# Patient Record
Sex: Male | Born: 1960 | Race: White | Hispanic: No | Marital: Married | Smoking: Former smoker
Health system: Southern US, Community
[De-identification: ages and names within clinical notes are randomized; demographics above are authoritative.]

## PROBLEM LIST (undated history)

## (undated) DIAGNOSIS — R351 Nocturia: Secondary | ICD-10-CM

## (undated) DIAGNOSIS — K279 Peptic ulcer, site unspecified, unspecified as acute or chronic, without hemorrhage or perforation: Secondary | ICD-10-CM

## (undated) DIAGNOSIS — I209 Angina pectoris, unspecified: Secondary | ICD-10-CM

## (undated) DIAGNOSIS — E118 Type 2 diabetes mellitus with unspecified complications: Secondary | ICD-10-CM

## (undated) DIAGNOSIS — M2550 Pain in unspecified joint: Secondary | ICD-10-CM

## (undated) DIAGNOSIS — N200 Calculus of kidney: Secondary | ICD-10-CM

## (undated) DIAGNOSIS — T4145XA Adverse effect of unspecified anesthetic, initial encounter: Secondary | ICD-10-CM

## (undated) DIAGNOSIS — E782 Mixed hyperlipidemia: Secondary | ICD-10-CM

## (undated) DIAGNOSIS — G8929 Other chronic pain: Secondary | ICD-10-CM

## (undated) DIAGNOSIS — I10 Essential (primary) hypertension: Secondary | ICD-10-CM

## (undated) DIAGNOSIS — J40 Bronchitis, not specified as acute or chronic: Secondary | ICD-10-CM

## (undated) DIAGNOSIS — I2510 Atherosclerotic heart disease of native coronary artery without angina pectoris: Secondary | ICD-10-CM

## (undated) DIAGNOSIS — E7800 Pure hypercholesterolemia, unspecified: Secondary | ICD-10-CM

## (undated) DIAGNOSIS — K219 Gastro-esophageal reflux disease without esophagitis: Secondary | ICD-10-CM

## (undated) DIAGNOSIS — M549 Dorsalgia, unspecified: Secondary | ICD-10-CM

## (undated) DIAGNOSIS — F329 Major depressive disorder, single episode, unspecified: Secondary | ICD-10-CM

## (undated) DIAGNOSIS — F419 Anxiety disorder, unspecified: Secondary | ICD-10-CM

## (undated) DIAGNOSIS — M109 Gout, unspecified: Secondary | ICD-10-CM

## (undated) DIAGNOSIS — M1990 Unspecified osteoarthritis, unspecified site: Secondary | ICD-10-CM

## (undated) DIAGNOSIS — Z87442 Personal history of urinary calculi: Secondary | ICD-10-CM

## (undated) DIAGNOSIS — T8859XA Other complications of anesthesia, initial encounter: Secondary | ICD-10-CM

## (undated) DIAGNOSIS — F32A Depression, unspecified: Secondary | ICD-10-CM

## (undated) HISTORY — DX: Other complications of anesthesia, initial encounter: T88.59XA

## (undated) HISTORY — DX: Mixed hyperlipidemia: E78.2

## (undated) HISTORY — DX: Atherosclerotic heart disease of native coronary artery without angina pectoris: I25.10

## (undated) HISTORY — DX: Type 2 diabetes mellitus with unspecified complications: E11.8

## (undated) HISTORY — DX: Morbid (severe) obesity due to excess calories: E66.01

## (undated) HISTORY — PX: ESOPHAGOGASTRODUODENOSCOPY: SHX1529

## (undated) HISTORY — DX: Adverse effect of unspecified anesthetic, initial encounter: T41.45XA

## (undated) HISTORY — DX: Essential (primary) hypertension: I10

## (undated) HISTORY — PX: BACK SURGERY: SHX140

---
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
## 1998-03-02 ENCOUNTER — Emergency Department (HOSPITAL_COMMUNITY): Admission: EM | Admit: 1998-03-02 | Discharge: 1998-03-02 | Payer: Self-pay | Admitting: Emergency Medicine

## 1999-05-30 ENCOUNTER — Emergency Department (HOSPITAL_COMMUNITY): Admission: EM | Admit: 1999-05-30 | Discharge: 1999-05-30 | Payer: Self-pay | Admitting: Emergency Medicine

## 1999-05-30 ENCOUNTER — Encounter: Payer: Self-pay | Admitting: Emergency Medicine

## 1999-09-20 ENCOUNTER — Encounter: Admission: RE | Admit: 1999-09-20 | Discharge: 1999-12-19 | Payer: Self-pay | Admitting: Gastroenterology

## 1999-09-22 ENCOUNTER — Emergency Department (HOSPITAL_COMMUNITY): Admission: EM | Admit: 1999-09-22 | Discharge: 1999-09-23 | Payer: Self-pay

## 1999-09-25 ENCOUNTER — Emergency Department (HOSPITAL_COMMUNITY): Admission: EM | Admit: 1999-09-25 | Discharge: 1999-09-26 | Payer: Self-pay | Admitting: Emergency Medicine

## 1999-09-26 ENCOUNTER — Encounter: Payer: Self-pay | Admitting: Emergency Medicine

## 2000-01-19 ENCOUNTER — Encounter: Admission: RE | Admit: 2000-01-19 | Discharge: 2000-01-19 | Payer: Self-pay | Admitting: Gastroenterology

## 2000-01-19 ENCOUNTER — Encounter: Payer: Self-pay | Admitting: Gastroenterology

## 2000-04-16 ENCOUNTER — Emergency Department (HOSPITAL_COMMUNITY): Admission: EM | Admit: 2000-04-16 | Discharge: 2000-04-16 | Payer: Self-pay | Admitting: Internal Medicine

## 2000-06-08 ENCOUNTER — Ambulatory Visit (HOSPITAL_COMMUNITY): Admission: RE | Admit: 2000-06-08 | Discharge: 2000-06-08 | Payer: Self-pay | Admitting: Specialist

## 2000-06-08 ENCOUNTER — Encounter: Payer: Self-pay | Admitting: Specialist

## 2000-09-05 HISTORY — PX: LUMBAR DISC SURGERY: SHX700

## 2000-11-22 ENCOUNTER — Encounter: Payer: Self-pay | Admitting: Specialist

## 2000-11-29 ENCOUNTER — Encounter: Payer: Self-pay | Admitting: Specialist

## 2000-11-29 ENCOUNTER — Encounter (INDEPENDENT_AMBULATORY_CARE_PROVIDER_SITE_OTHER): Payer: Self-pay | Admitting: Specialist

## 2000-11-29 ENCOUNTER — Observation Stay (HOSPITAL_COMMUNITY): Admission: RE | Admit: 2000-11-29 | Discharge: 2000-11-30 | Payer: Self-pay | Admitting: Specialist

## 2002-02-11 ENCOUNTER — Emergency Department (HOSPITAL_COMMUNITY): Admission: EM | Admit: 2002-02-11 | Discharge: 2002-02-11 | Payer: Self-pay | Admitting: Emergency Medicine

## 2002-06-26 ENCOUNTER — Encounter: Admission: RE | Admit: 2002-06-26 | Discharge: 2002-06-26 | Payer: Self-pay | Admitting: Neurosurgery

## 2002-06-26 ENCOUNTER — Encounter: Payer: Self-pay | Admitting: Neurosurgery

## 2002-09-01 ENCOUNTER — Emergency Department (HOSPITAL_COMMUNITY): Admission: EM | Admit: 2002-09-01 | Discharge: 2002-09-01 | Payer: Self-pay | Admitting: Emergency Medicine

## 2002-09-02 ENCOUNTER — Ambulatory Visit (HOSPITAL_COMMUNITY): Admission: EM | Admit: 2002-09-02 | Discharge: 2002-09-02 | Payer: Self-pay | Admitting: Emergency Medicine

## 2002-09-05 DIAGNOSIS — I2510 Atherosclerotic heart disease of native coronary artery without angina pectoris: Secondary | ICD-10-CM

## 2002-09-05 DIAGNOSIS — T8859XA Other complications of anesthesia, initial encounter: Secondary | ICD-10-CM

## 2002-09-05 HISTORY — DX: Other complications of anesthesia, initial encounter: T88.59XA

## 2002-09-05 HISTORY — PX: LUMBAR FUSION: SHX111

## 2002-09-05 HISTORY — DX: Atherosclerotic heart disease of native coronary artery without angina pectoris: I25.10

## 2003-03-14 ENCOUNTER — Encounter: Payer: Self-pay | Admitting: Neurosurgery

## 2003-03-14 ENCOUNTER — Encounter: Admission: RE | Admit: 2003-03-14 | Discharge: 2003-03-14 | Payer: Self-pay | Admitting: Neurosurgery

## 2003-04-24 ENCOUNTER — Encounter: Payer: Self-pay | Admitting: Neurosurgery

## 2003-04-29 ENCOUNTER — Inpatient Hospital Stay (HOSPITAL_COMMUNITY): Admission: RE | Admit: 2003-04-29 | Discharge: 2003-04-30 | Payer: Self-pay | Admitting: Neurosurgery

## 2003-04-30 HISTORY — PX: US ECHOCARDIOGRAPHY: HXRAD669

## 2003-05-30 ENCOUNTER — Ambulatory Visit (HOSPITAL_COMMUNITY): Admission: RE | Admit: 2003-05-30 | Discharge: 2003-05-31 | Payer: Self-pay | Admitting: *Deleted

## 2003-05-30 HISTORY — PX: CORONARY STENT PLACEMENT: SHX1402

## 2003-09-01 ENCOUNTER — Inpatient Hospital Stay (HOSPITAL_COMMUNITY): Admission: RE | Admit: 2003-09-01 | Discharge: 2003-09-05 | Payer: Self-pay | Admitting: Neurosurgery

## 2003-09-06 DIAGNOSIS — I10 Essential (primary) hypertension: Secondary | ICD-10-CM

## 2003-09-06 HISTORY — DX: Essential (primary) hypertension: I10

## 2003-09-24 DIAGNOSIS — G8929 Other chronic pain: Secondary | ICD-10-CM

## 2003-09-24 HISTORY — DX: Other chronic pain: G89.29

## 2003-11-25 ENCOUNTER — Encounter: Admission: RE | Admit: 2003-11-25 | Discharge: 2003-11-25 | Payer: Self-pay | Admitting: Neurosurgery

## 2004-01-27 ENCOUNTER — Encounter: Admission: RE | Admit: 2004-01-27 | Discharge: 2004-01-27 | Payer: Self-pay | Admitting: Neurosurgery

## 2004-04-08 ENCOUNTER — Encounter: Admission: RE | Admit: 2004-04-08 | Discharge: 2004-04-08 | Payer: Self-pay | Admitting: Neurosurgery

## 2004-05-19 HISTORY — PX: NM MYOCAR PERF WALL MOTION: HXRAD629

## 2004-05-25 ENCOUNTER — Encounter: Admission: RE | Admit: 2004-05-25 | Discharge: 2004-05-25 | Payer: Self-pay | Admitting: Neurosurgery

## 2004-06-23 ENCOUNTER — Encounter
Admission: RE | Admit: 2004-06-23 | Discharge: 2004-09-21 | Payer: Self-pay | Admitting: Physical Medicine & Rehabilitation

## 2004-06-25 ENCOUNTER — Ambulatory Visit: Payer: Self-pay | Admitting: Physical Medicine & Rehabilitation

## 2004-08-12 ENCOUNTER — Ambulatory Visit: Payer: Self-pay | Admitting: Physical Medicine & Rehabilitation

## 2004-09-05 DIAGNOSIS — F32A Depression, unspecified: Secondary | ICD-10-CM

## 2004-09-05 HISTORY — DX: Depression, unspecified: F32.A

## 2004-10-12 ENCOUNTER — Ambulatory Visit: Payer: Self-pay | Admitting: Physical Medicine & Rehabilitation

## 2004-10-12 ENCOUNTER — Encounter
Admission: RE | Admit: 2004-10-12 | Discharge: 2005-01-10 | Payer: Self-pay | Admitting: Physical Medicine & Rehabilitation

## 2004-10-25 ENCOUNTER — Ambulatory Visit: Payer: Self-pay | Admitting: Physical Medicine & Rehabilitation

## 2004-11-12 ENCOUNTER — Encounter
Admission: RE | Admit: 2004-11-12 | Discharge: 2004-11-12 | Payer: Self-pay | Admitting: Physical Medicine & Rehabilitation

## 2005-01-13 ENCOUNTER — Encounter
Admission: RE | Admit: 2005-01-13 | Discharge: 2005-04-13 | Payer: Self-pay | Admitting: Physical Medicine & Rehabilitation

## 2005-01-17 ENCOUNTER — Ambulatory Visit: Payer: Self-pay | Admitting: Physical Medicine & Rehabilitation

## 2005-03-11 ENCOUNTER — Ambulatory Visit: Payer: Self-pay | Admitting: Physical Medicine & Rehabilitation

## 2005-04-28 ENCOUNTER — Emergency Department (HOSPITAL_COMMUNITY): Admission: EM | Admit: 2005-04-28 | Discharge: 2005-04-28 | Payer: Self-pay | Admitting: Emergency Medicine

## 2005-05-11 ENCOUNTER — Ambulatory Visit: Payer: Self-pay | Admitting: Physical Medicine & Rehabilitation

## 2005-05-11 ENCOUNTER — Encounter
Admission: RE | Admit: 2005-05-11 | Discharge: 2005-08-09 | Payer: Self-pay | Admitting: Physical Medicine & Rehabilitation

## 2005-08-05 ENCOUNTER — Ambulatory Visit: Payer: Self-pay | Admitting: Physical Medicine & Rehabilitation

## 2005-10-19 ENCOUNTER — Emergency Department (HOSPITAL_COMMUNITY): Admission: EM | Admit: 2005-10-19 | Discharge: 2005-10-19 | Payer: Self-pay | Admitting: Emergency Medicine

## 2005-10-26 ENCOUNTER — Ambulatory Visit: Payer: Self-pay | Admitting: Physical Medicine & Rehabilitation

## 2005-10-26 ENCOUNTER — Encounter
Admission: RE | Admit: 2005-10-26 | Discharge: 2006-01-24 | Payer: Self-pay | Admitting: Physical Medicine & Rehabilitation

## 2006-01-25 ENCOUNTER — Encounter
Admission: RE | Admit: 2006-01-25 | Discharge: 2006-04-25 | Payer: Self-pay | Admitting: Physical Medicine & Rehabilitation

## 2006-01-25 ENCOUNTER — Ambulatory Visit: Payer: Self-pay | Admitting: Physical Medicine & Rehabilitation

## 2006-06-01 ENCOUNTER — Encounter
Admission: RE | Admit: 2006-06-01 | Discharge: 2006-08-30 | Payer: Self-pay | Admitting: Physical Medicine & Rehabilitation

## 2006-06-01 ENCOUNTER — Ambulatory Visit: Payer: Self-pay | Admitting: Physical Medicine & Rehabilitation

## 2006-10-20 ENCOUNTER — Encounter
Admission: RE | Admit: 2006-10-20 | Discharge: 2007-01-18 | Payer: Self-pay | Admitting: Physical Medicine & Rehabilitation

## 2006-10-20 ENCOUNTER — Ambulatory Visit: Payer: Self-pay | Admitting: Physical Medicine & Rehabilitation

## 2007-02-15 ENCOUNTER — Encounter
Admission: RE | Admit: 2007-02-15 | Discharge: 2007-05-16 | Payer: Self-pay | Admitting: Physical Medicine & Rehabilitation

## 2007-02-15 ENCOUNTER — Ambulatory Visit: Payer: Self-pay | Admitting: Physical Medicine & Rehabilitation

## 2007-02-21 DIAGNOSIS — M109 Gout, unspecified: Secondary | ICD-10-CM

## 2007-02-21 HISTORY — DX: Gout, unspecified: M10.9

## 2007-05-15 ENCOUNTER — Encounter
Admission: RE | Admit: 2007-05-15 | Discharge: 2007-05-16 | Payer: Self-pay | Admitting: Physical Medicine & Rehabilitation

## 2007-05-15 ENCOUNTER — Ambulatory Visit: Payer: Self-pay | Admitting: Physical Medicine & Rehabilitation

## 2007-05-23 ENCOUNTER — Encounter: Admission: RE | Admit: 2007-05-23 | Discharge: 2007-05-23 | Payer: Self-pay | Admitting: Internal Medicine

## 2007-08-09 ENCOUNTER — Encounter
Admission: RE | Admit: 2007-08-09 | Discharge: 2007-08-11 | Payer: Self-pay | Admitting: Physical Medicine & Rehabilitation

## 2007-08-09 ENCOUNTER — Ambulatory Visit: Payer: Self-pay | Admitting: Physical Medicine & Rehabilitation

## 2007-11-28 ENCOUNTER — Encounter
Admission: RE | Admit: 2007-11-28 | Discharge: 2007-11-28 | Payer: Self-pay | Admitting: Physical Medicine & Rehabilitation

## 2008-03-14 ENCOUNTER — Encounter
Admission: RE | Admit: 2008-03-14 | Discharge: 2008-03-14 | Payer: Self-pay | Admitting: Physical Medicine & Rehabilitation

## 2008-03-14 ENCOUNTER — Ambulatory Visit: Payer: Self-pay | Admitting: Physical Medicine & Rehabilitation

## 2008-06-26 ENCOUNTER — Encounter
Admission: RE | Admit: 2008-06-26 | Discharge: 2008-09-24 | Payer: Self-pay | Admitting: Physical Medicine & Rehabilitation

## 2008-08-07 ENCOUNTER — Ambulatory Visit: Payer: Self-pay | Admitting: Physical Medicine & Rehabilitation

## 2009-11-27 ENCOUNTER — Observation Stay (HOSPITAL_COMMUNITY): Admission: EM | Admit: 2009-11-27 | Discharge: 2009-11-28 | Payer: Self-pay | Admitting: Cardiovascular Disease

## 2009-11-27 ENCOUNTER — Encounter: Payer: Self-pay | Admitting: Emergency Medicine

## 2009-11-27 HISTORY — PX: CARDIAC CATHETERIZATION: SHX172

## 2010-11-29 LAB — BASIC METABOLIC PANEL
BUN: 16 mg/dL (ref 6–23)
CO2: 23 mEq/L (ref 19–32)
CO2: 24 mEq/L (ref 19–32)
Calcium: 8.4 mg/dL (ref 8.4–10.5)
Calcium: 9 mg/dL (ref 8.4–10.5)
Creatinine, Ser: 0.98 mg/dL (ref 0.4–1.5)
Creatinine, Ser: 1.15 mg/dL (ref 0.4–1.5)
GFR calc Af Amer: >60 mL/min (ref >60)
GFR calc non Af Amer: >60 mL/min (ref >60)
GFR calc non Af Amer: >60 mL/min (ref >60)
Glucose, Bld: 154 mg/dL — ABNORMAL HIGH (ref 70–99)
Glucose, Bld: 216 mg/dL — ABNORMAL HIGH (ref 70–99)
Sodium: 138 mEq/L (ref 135–145)

## 2010-11-29 LAB — CBC
Hemoglobin: 12.9 g/dL — ABNORMAL LOW (ref 13.0–17.0)
Hemoglobin: 14.2 g/dL (ref 13.0–17.0)
MCHC: 34.3 g/dL (ref 30.0–36.0)
MCHC: 34.6 g/dL (ref 30.0–36.0)
Platelets: 248 10*3/uL (ref 150–400)
RDW: 12.1 % (ref 11.5–15.5)
RDW: 12.1 % (ref 11.5–15.5)

## 2010-11-29 LAB — HEPATIC FUNCTION PANEL
ALT: 46 U/L (ref 0–53)
AST: 36 U/L (ref 0–37)
Total Protein: 6.6 g/dL (ref 6.0–8.3)

## 2010-11-29 LAB — CARDIAC PANEL(CRET KIN+CKTOT+MB+TROPI)
CK, MB: 7.5 ng/mL (ref 0.3–4.0)
CK, MB: 9.7 ng/mL (ref 0.3–4.0)
Relative Index: 2.4 (ref 0.0–2.5)
Total CK: 314 U/L — ABNORMAL HIGH (ref 7–232)
Troponin I: <0.01 ng/mL (ref 0.00–0.06)

## 2010-11-29 LAB — CK TOTAL AND CKMB (NOT AT ARMC)
CK, MB: 16.4 ng/mL (ref 0.3–4.0)
Relative Index: 3 — ABNORMAL HIGH (ref 0.0–2.5)

## 2010-11-29 LAB — LIPID PANEL
Cholesterol: 110 mg/dL (ref 0–200)
LDL Cholesterol: 41 mg/dL (ref 0–99)
Total CHOL/HDL Ratio: 3.5 RATIO
VLDL: 38 mg/dL (ref 0–40)

## 2010-11-29 LAB — HEPARIN LEVEL (UNFRACTIONATED)
Heparin Unfractionated: <0.1 IU/mL — ABNORMAL LOW (ref 0.30–0.70)
Heparin Unfractionated: <0.1 IU/mL — ABNORMAL LOW (ref 0.30–0.70)

## 2010-11-29 LAB — MAGNESIUM: Magnesium: 2 mg/dL (ref 1.5–2.5)

## 2010-11-29 LAB — TROPONIN I: Troponin I: 0.01 ng/mL (ref 0.00–0.06)

## 2010-11-29 LAB — DIFFERENTIAL
Basophils Absolute: 0 10*3/uL (ref 0.0–0.1)
Eosinophils Absolute: 0 10*3/uL (ref 0.0–0.7)
Eosinophils Relative: 0 % (ref 0–5)
Lymphocytes Relative: 20 % (ref 12–46)
Monocytes Absolute: 0.6 10*3/uL (ref 0.1–1.0)

## 2010-11-29 LAB — D-DIMER, QUANTITATIVE: D-Dimer, Quant: <0.22 ug/mL-FEU (ref 0.00–0.48)

## 2010-11-29 LAB — GLUCOSE, CAPILLARY
Glucose-Capillary: 158 mg/dL — ABNORMAL HIGH (ref 70–99)
Glucose-Capillary: 206 mg/dL — ABNORMAL HIGH (ref 70–99)
Glucose-Capillary: 209 mg/dL — ABNORMAL HIGH (ref 70–99)

## 2010-11-29 LAB — HEMOGLOBIN A1C: Mean Plasma Glucose: 177 mg/dL

## 2011-01-18 NOTE — Assessment & Plan Note (Signed)
[name] returned to clinic today for followup evaluation.  He missed the  last appointment as he was scheduled for 11:30 according to our records,  but his card indicated late afternoon.  He had trouble getting in  because of his work schedule.  He does request that we see him mid day  to late afternoon.   The patient continues to get good relief using his oxycodone 15 mg 4  times per day.  He still has a few remaining from the prescription dated  from July 09, 2008.  He will not need a refill until mid month.   The patient reports that his recent blood sugar has been in the 120-140  range.  His wife has recovered from her hysterectomy and they are both  doing well overall.   REVIEW OF SYSTEMS:  Noncontributory.   MEDICATIONS:  1. Flexeril 10 mg daily p.r.n.  2. Oxycodone 15 mg 1 tablet q.i.d. p.r.n.  3. Avandia 8 mg daily.  4. Diovan/hydrochlorothiazide 160/12.5 one tablet daily.  5. Enteric-coated aspirin 81 mg daily.  6. Metformin 500 mg b.i.d.  7. Lipitor 10 mg daily.  8. Allopurinol daily.   PHYSICAL EXAMINATION:  GENERAL:  Well-appearing, moderately overweight  adult male in mild to no acute discomfort.  VITAL SIGNS:  Blood pressure 127/70 with pulse of 107, respiratory rate  18, and O2 saturation 96% on room air.  EXTREMITIES:  The patient has 5/5 strength throughout the bilateral  upper and lower extremities.  He ambulates without any assistive device.   IMPRESSION:  Persistent low back and right leg pain after lumbar  diskectomy/fusion/foraminotomy in December 2004.   In the office today, we did refill the patient's oxycodone as of  August 18, 2008, a total of 120.  We will plan on seeing the patient  in followup in approximately 6 month's time.  He continues to get good  analgesic effect overall without signs of diversion or significant side  effects.  We will plan on seeing the patient in followup as noted above.           ______________________________  Ellwood Dense, M.D.     DC/MedQ  D:  08/07/2008 11:44:55  T:  08/07/2008 23:32:14  Job #:  [id number]

## 2011-01-18 NOTE — Assessment & Plan Note (Signed)
Devon Wood returns to the clinic today for followup evaluation.  He  continues to do well overall.  He is generally using his oxycodone 1 to  2 tablets per day and has 15 remaining from a script filled, Jan 25, 2008.  He does occasionally use slightly increased amounts but tries to  limit it as much as possible.  He is very compliant with medication  administration.  He reports that his blood sugars are in the 107 to 136  range.  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  He reports that he has lost 20 pounds recently, secondary to stress  related to his wife's recent hysterectomy with complications.   REVIEW OF SYSTEMS:  Noncontributory.   MEDICATIONS:  1. Flexeril 10 mg daily p.r.n.  2. Oxycodone 15 mg 1 tablet q.i.d. p.r.n.  3. Avandia 8 mg daily.  4. Diovan/hydrochlorothiazide 160/12.5 1 tablet daily.  5. Enteric-coated aspirin 81 mg daily.  6. Metformin 500 mg b.i.d.  7. Lipitor 10 mg daily.  8. Allopurinol daily.   PHYSICAL EXAMINATION:  GENERAL:  Moderately to extremely overweight  adult male in mild-to-no acute discomfort.  VITAL SIGNS:  Blood pressure 119/68 with a pulse of 95, respiratory rate  20, and O2 saturation 96% on room air.  EXTREMITIES:  He has 5/5 strength throughout the bilateral upper and  lower extremities.  He ambulates without any assistive device.   IMPRESSION:  Persistent low back and right leg pain after lumbar  diskectomy/fusion/foraminotomy, December 2004.   In the office today, we did refill the patients' oxycodone as of March 21, 2008, a total of 120 were prescribed.  We will plan on seeing him at  follow up in approximately 4 months' time with refills prior that  appointment as necessary.           ______________________________  Devon Wood, M.D.     DC/MedQ  D:  03/14/2008 10:12:51  T:  03/15/2008 07:21:09  Job #:  [id number]

## 2011-01-18 NOTE — Assessment & Plan Note (Signed)
Mr. Nuon returns to clinic today for followup evaluation.  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  He reports that during the summer they  increase their hours and have to do heavier work compared to the regular  school year.  He has increased his oxycodone on occasional days, but  generally he takes it anywhere from 2 to 10 times per day.  He does not  need a refill over the next couple of days.  He reports that he  generally works 10 to 12 hour days at the present time.  He reports that  his blood sugars have been in the 130 range, and he has lost slight  weight recently.   REVIEW OF SYSTEMS:  Noncontributory.   MEDICATIONS:  1. Flexeril 10 mg daily p.r.n.  2. Oxycodone 5 mg 1 to 2 tablets p.o. q.i.d. p.r.n. (approximately 2-      10 per day).  3. Avandia 8 mg daily.  4. Glipizide 10 mg.  5. Diovan/hydrochlorothiazide 160/12.5 one tablet daily.  6. Enteric coated aspirin 81 mg daily.  7. Metformin 500 mg b.i.d.  8. Lipitor 10 mg.  9. Allopurinol daily.  10.Minocycline 100 mg daily.   PHYSICAL EXAMINATION:  Well appearing, moderately overweight, well-  nourished adult male in mild to no acute discomfort.  Blood pressure is 148/62 with a pulse of 63.  Respiratory rate 17, and  O2 saturation 97% on room air.  He has 5/5 strength throughout the bilateral upper and lower  extremities.   IMPRESSION:  Persistent low back and right leg pain after lumbar  diskectomy/fusion/foraminotomy December 2004.   In the office today, we did refill the patient's oxycodone, a total of  180 as of February 23, 2007.  He continues to be compliant with the  medication getting good analgesic effect with use of the medication only  as absolutely necessary as indicated by his doses listed above.  We will  plan on seeing the patient in followup in approximately 3 months' time  with refills prior to that appointment as necessary.          ______________________________  Ellwood Dense, M.D.     DC/MedQ  D:  02/21/2007 10:15:58  T:  02/21/2007 13:22:55  Job #:  [id number]

## 2011-01-18 NOTE — Assessment & Plan Note (Signed)
This is a followup office note.   Devon Wood returns to the clinic today for followup evaluation.  He reports  that he is doing well on a switch from the oxycodone 5 mg to the 15 mg.  He is using 1 tablet generally one to three times per day and getting  good relief.  He is not experiencing adverse side effects and we did not  really expect any since he was taking up to three of the 5 mg tablets  previously.  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  He reports that his blood  sugar has been consistently less than 130 and that his glipizide is on  hold at the present time.   REVIEW OF SYSTEMS:  Noncontributory.   MEDICATIONS:  1. Flexeril 10 mg daily p.r.n.  2. Oxycodone 15 mg one tablet q.i.d. p.r.n.  3. Avandia 8 mg daily.  4. Diovan/hydrochlorothiazide 160/12.5, one tablet daily.  5. Enteric-coated aspirin 81 mg daily.  6. Metformin 500 mg b.i.d.  7. Lipitor 10 mg daily.  8. Allopurinol daily.   PHYSICAL EXAMINATION:  GENERAL:  A moderately overweight adult, well  nourished male in mild to no acute discomfort.  VITAL SIGNS:  Not obtained in the office today.  MUSCULOSKELETAL:  He has 5/5 strength throughout the bilateral upper and  lower extremities and he ambulates without any assistive device.   IMPRESSION:  Persistent low back and right leg pain, after lumbar  diskectomy/fusion/foraminotomy - December 2004.   In the office today, we did refill the patient's oxycodone a total of  #120 tablets as of August 16, 2007.  He continues to get good  analgesic effect without signs of diversion or significant side effects.  We will plan on seeing the patient in followup in approximately 4  months' time.           ______________________________  Devon Wood, M.D.     DC/MedQ  D:  08/10/2007 15:29:56  T:  08/10/2007 23:12:31  Job #:  [id number]

## 2011-01-18 NOTE — Assessment & Plan Note (Signed)
Devon Wood returns to the clinic today for followup evaluation.  He reports  that he is taking his oxycodone generally two tablets four times a day  but occasionally goes up to a total of 10 tablets per day.  He generally  averages an 8 to 10.  He reports that he is getting slightly less relief  with the two tablets and occasionally has used three tablets at a time  without significant side effects.  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  He has had some people off work  and they have had to do some extra work especially during the summer  when they have most of their heavy work that has to be done.  His wife  is also being evaluated for uterine cancer and that is putting some  stress on the patient.  His blood sugar has been in the 135 to 150  range.   REVIEW OF SYSTEMS:  Noncontributory.   MEDICATIONS:  1. Flexeril 10 mg daily p.r.n.  2. Oxycodone 5 mg one to two tablets p.o. q.i.d. p.r.n. (approximately      8 to 10 per day).  3. Avandia 8 mg daily.  4. Glipizide 10 mg daily.  5. Diovan/hydrochlorothiazide 160/12.5 one tablet daily.  6. Enteric-coated aspirin 81 mg daily.  7. Metformin 500 mg b.i.d.  8. Lipitor 10 mg daily.  9. Allopurinol daily.  10.Minocycline 100 mg daily.   PHYSICAL EXAMINATION:  GENERAL:  A moderately overweight, well-  nourished, adult male in mild acute discomfort.  VITAL SIGNS:  Blood pressure 145/84 with a pulse of 78, respiratory rate  18, and O2 saturation 96% on room air.   He has 5/5 strength throughout the bilateral upper and lower  extremities.  He ambulates without any assistive device.   IMPRESSION:  Persistent low back and right leg pain after lumbar  diskectomy/fusion/foraminotomy, December of 2004.   In the office today, we did adjust the patient's oxycodone to a 15 mg  tablet to be used one tablet q.i.d. p.r.n.  This should give him some  better relief.  It still is used on an as needed basis.  We  will plan on  seeing him in followup in approximately three months time with refills  prior to that time appointment as necessary.           ______________________________  Ellwood Dense, M.D.     DC/MedQ  D:  05/16/2007 10:16:37  T:  05/16/2007 19:52:23  Job #:  [id number]

## 2011-01-21 NOTE — Assessment & Plan Note (Signed)
HISTORY OF PRESENT ILLNESS:  Devon Wood returns to clinic today for followup  evaluation. 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  He has lost 40 pounds doing the extra  work, especially through the summer.  He is actually more active in work  during the summer than he is during the school year.  He continues to be  followed by Dr. Donette Larry, his primary care physician.  The patient  reports  that he is getting good relief from his oxycodone, used anywhere from four  to six tablets per day.  That was last week of May 19, 2006 and he  does not need a refill at this time.  He has a sufficient supply of Flexeril  and Lexapro also in the office today.  He was reports that his blood sugar  has been in the low 100's.   MEDICATIONS:  1. Flexeril 10 mg daily p.r.n.  2. Oxycodone 5 mg one to two tablets p.o. q.i.d. p.r.n. (four to six per      day).  3. Avandia 8 mg daily.  4. __________  daily.  5. Glipizide 5 mg daily.  6. Diovan/HCTZ 160/12.5 one tablet daily.  7. Lexapro 20 mg daily.  8. Enteric-coated aspirin 81 mg daily.  9. Metformin 500 mg b.i.d.  10.Lipitor 10 mg at night.  11.Allopurinol daily.   REVIEW OF SYSTEMS:  Noncontributory.   PHYSICAL EXAMINATION:  GENERAL:  Well-appearing, moderately overweight, well-  nourished, adult male in no acute discomfort.  VITAL SIGNS: Blood pressure 128/71 with a pulse of 113, respiratory rate 16,  oxygen saturation 93% on room air.  NEUROMUSCULAR:  He has 5/5 strength throughout the bilateral upper and lower  extremities.  Bulk and tone were normal.  Reflexes 2+ and symmetrical.  Lumbar range of motion was decreased in all planes.  He ambulates without  any assistive device.   IMPRESSION:  Persistent low back pain and right leg pain after lumbar  diskectomy/fusion/foraminotomy December 2004.   In the office today, no refill on medication is necessary.  He will call in  for refills as  necessary.  Will plan on seeing him in followup in  approximately four to five months.           ______________________________  Ellwood Dense, M.D.     DC/MedQ  D:  06/02/2006 15:32:05  T:  06/05/2006 01:21:05  Job #:  [id number]

## 2011-01-21 NOTE — Assessment & Plan Note (Signed)
DATE OF EVALUATION:  November 18, 2004.   MEDICAL RECORD NUMBER:  [id number].   [name] returns to the clinic today for followup evaluation.  He reports  that he is getting slightly better relief with the oxycodone compared to the  hydrocodone.  He generally uses 5 mg two tablets twice a day, although the  prescription allows him to use it three times a day.  Occasionally he does  use it three times a day and in fact has the need to occasionally use it  even more frequently.  For the most part, he uses it just two tablets twice  a day.  He does report increased pain when he has been trying to walking a  longer distance and he is up to walk approximately 20 minutes at a time.  He  also reports increased back pain with the colder weather.   The patient has undergone a functional capacity evaluation dated November 12, 2004.  It showed that the patient showed good effort throughout the testing.  It was shown that he should be able to work at a light physical demand level  for an eight-hour day.  There were some areas that the patient was not able  to participate in, but he definitely appeared to give maximum effort during  the majority of the testing.  No adverse __________ the pain behaviors were  noted to any significant degree.   The patient has undergone an EMG nerve conduction study of his right leg  performed by Dr. Riley Kill at our request.  The impression was delayed tibial  distal latency on the right which could represent a radiculopathic process  at L5-S1, although EMG exam does not support an acute or chronic axonal or  demyelinating process.  There was slower than expected nerve conduction  velocities for his age throughout, but they were still within normal limits.  The patient has a history of an L5-S1 fusion previously done by Dr. Wynetta Emery in  December of 2004.   At the present time, the patient reports that his sleep is reasonable.  He  does get to sleep with the Restoril, but has  problems staying asleep.  He  reports that he usually wakes because of pain.  He does not use the Restoril  nightly.   MEDICATIONS:  1.  Flexeril 10 mg daily p.r.n.  2.  Neurontin 800 mg q.i.d.  3.  Oxycodone 5 mg one to two tablets p.o. t.i.d. p.r.n. (approximately four      per day).  4.  Avandia 8 mg daily.  5.  Viagra 100 mg daily.  6.  Minocycline 100 mg daily.  7.  Glipizide 5 mg daily.  8.  Diovan/hydrochlorothiazide 160/12.5 mg one tablet daily.  9.  Lexapro 20 mg one-and-a-half tablets daily.  10. Enteric-coated aspirin 81 mg daily.  11. Metformin 500 mg two tablets b.i.d.  12. Lipitor 10 mg one tablet q.a.m.  13. Restoril 15 mg q.h.s. p.r.n.   PHYSICAL EXAMINATION:  A reasonably well-appearing, overweight, adult male  in mild acute discomfort.  He generally stands throughout most of the  examination with only limited sitting.  The blood pressure was 140/62 with a  pulse of 89, a respiratory rate of 16 and O2 saturation 97% on room air.  He  generally has 5-/5 strength throughout the bilateral lower extremities.  Lumbar range of motion was decreased in all planes.  Reflexes were 2+ and  symmetrical at the bilateral knees and 1+ at the  bilateral ankles.  Sensation was intact to light touch throughout the bilateral lower  extremities.   IMPRESSION:  Persistent low back and right leg pain after lumbar diskectomy,  fusion and foraminotomy in December of 2004.   At the present time, we have refilled his oxycodone and allowed him to use  it 5 mg one to two tablets p.o. q.i.d. p.r.n. as of November 20, 2004.  We have  also filled out the [employer] medical status questionnaire for  him and given him a copy along with Ms. Ward, his case manager, who is in  the office today.  Will plan on seeing the patient in followup in this  office in approximately two months' time.  There is a mediation process  scheduled for next week between the two sides in this case.  I have   completed the paperwork regarding work restrictions and I would recommend  gradual return to work for this gentleman, who has been inactive for a  fairly long period of time.  Will plan on seeing him in followup in  approximately two months' time.      DC/MedQ  D:  11/18/2004 10:06:22  T:  11/18/2004 11:33:18  Job #:  [id number]

## 2011-01-21 NOTE — Op Note (Signed)
NAME:  Devon Wood, Devon Wood                           ACCOUNT NO.:  [id number]   MEDICAL RECORD NO.:  [id number]                   PATIENT TYPE:  INP   LOCATION:  3172                                 FACILITY:  [facility]   PHYSICIAN:  Donalee Citrin, M.D.                     DATE OF BIRTH:  1961-05-21   DATE OF PROCEDURE:  09/01/2003  DATE OF DISCHARGE:                                 OPERATIVE REPORT   PREOPERATIVE DIAGNOSIS:  Severe spinal stenosis from a large ruptured disk  at L5-S1 encased in scar.  Degenerative disk disease L5-S1 with mechanical  low back pain, diskogenic instability L5-S1.   PROCEDURE:  Redo decompressive laminectomy L5-S1, posterior lumbar interbody  fusion L5-S1 using 10 x 26 mm tangent allograft wedges, pedicle screw  fixation L5-S1 using the N10C Horizon pedicle screw system Legacy.  Posterior lateral arthrodesis L5-S1 using locally harvested autograft,  placement of deep Hemovac drain.   SURGEON:  Donalee Citrin, M.D.   ASSISTANT:  Tia Alert, M.D.   ANESTHESIA:  General.   INDICATIONS FOR PROCEDURE:  The patient is a very pleasant 50 year old  gentleman who has had longstanding back and thrombolytic right leg pain.  He  reported his back pain was much greater than his leg pain.  He had previous  laminectomy several years ago, however, he had complete resolution of his  back and leg pain at that time, however, after a work-related injury has had  progressive worsening and severe recurrence of his back and leg pain  radiating down in an S1 distribution of his right leg.  The patient had been  refractory to all forms of conservative treatment, preoperative imaging with  MRI, CT scan, and diskogram all showed a strongly concordant response to  disk injection at L5-S1, a large recurring disk rupture at L5-S1, severe  spinal stenosis and biforaminal stenosis at the L5-S1 nerve roots, and  severe collapse and degenerative disk disease at L5-S1.  Due to the  patient's  failure to conservative treatment and his response to diskogram,  the patient was recommended decompressive stabilization procedure.  Extensively discussed the surgery with him.  He understood and agreed to  proceed forward.   DESCRIPTION OF PROCEDURE:  The patient was brought to the OR and prior to  the induction of general anesthesia the patient had a pacing wire placed.  This was placed without difficulty and then the patient was induced under  general anesthesia, positioned prone, and again the pacing wire was checked  and noted to be intact.  Then his back was prepped and draped in the usual  sterile fashion.  His old incision was opened up and extended superiorly and  inferiorly.  Scar tissue was teased away with Bovie electrocautery and  subperiosteal dissection carried out around the residual lamina of L5 as  well as L4 and S1.  The TP's at L5  and S2 were identified.  Self retaining  retractor was placed.  Then the scar tissue was freed away from the  undersurface of the residual lamina of L5 with a combination of the Leksell  rongeur, large Cobb curet, as well as a 4 Penfield.  Then using a 2 and 3 mm  Kerrison punch, the inferior aspect of the lamina of L5 was removed up to  the inferior aspect of the L4 lamina.  Then the pedicle of L5 was identified  and the L5 nerve root was identified.  This was followed out its foramen and  widely decompressed down full foramen on either side.  Then the scar tissue  was again freed up inferiorly and complete medial facetectomies were  performed bilaterally working underneath the scar tissue and freeing up from  the S1 and L5 nerve roots.  After both medial facets were taken off, the S1  nerve root was identified and it was dissected off the pedicle.  There was a  large amount of scar appreciated promptly on the left, but also on the right  with a large calcified disk central and leftward compressing both S1 nerve  roots.  The S1 nerve roots  were teased up off this disk space and then using  the D'Errico nerve retractor, the left S1 nerve root was reflected medially  and the disk space was entered.  Calcified fragments were removed with  Kerrison punch as well as downgoing Epstein curets and a tennis racquet was  inserted.  There was noted to be a diverticula coming off the right S1 nerve  root and this was freed up from the disk space and mobilized and the S1  nerve root was reflected medially.  This disk again was incised and several  fragments of disk were removed from the central compartment as well as  distal compartment cleaned out.  Then using a size 10 cutter chisel the end  plates were used as bone graft.  Fluoroscopy confirmed good position at each  step along the way and a 10 x 26 tangent was inserted on the right side  noting good apposition of the end plates.  Then again the left S1 nerve root  was reflected medially using the size 10 cutter and chisel.  The plate was  prepared to receive the bone graft.  Locally harvested autograft was packed  into the allograft on the right side after several large fragments of  calcified disk was removed from the central compartment and the locally  harvested autograft was packed in.  Then a 10 x 26 mm tangent allograft was  inserted on the left side.  Both allografts were inserted approximately 2 mm  deep to the posterior vertebral outline confirmed by fluoroscopy.  Then  ___________ was placed.  Pilot holes were drilled with high speed drill  first at L5 on the left.  Cannuli with the awl tap with a 5.5 tap probed and  6 x 45 inserted at the left L5 using fluoroscopy to confirm a good two step  trajectory directed ___________ dissection confirmed.  No breech as well as  direct interpedicular inspection confirmed complete __________ orientation.  S1 screw was inserted in a similar fashion, however, this was a 6 x 35 X screw.  Again this procedure was repeated on the right side  at L5 and S1.  Then the wound was copiously irrigated with meticulous hemostasis obtained  and aggressively decortication was carried out in the TP's of L5 and S1  and  the lateral gutters.  Remainder of the locally harvested autograft was  packed in the lateral gutters along the TP's at L5 and S1. Then 30 mm rod  was size selected and inserted.  Subsequently was tapped down to S1 and L5  pedicle screws were compressed against S1 neuroforamen reexplored with  hockey stick and noted to have no further stenosis both at the L5 and S1  nerve roots bilaterally.  There was no bone graft material appreciated  within the canal.  Gelfoam was overlaid topically with a postoperative  fluoroscopy showed good position of the screws, rods, and bone grafts.  Then  a medium Hemovac drain was placed.  The muscle and fascia were  reapproximated with 0 interrupted Vicryl, the subcutaneous tissue closed  with 2-0 interrupted Vicryl, and the skin was closed with a running 4-0  subcuticular.  Benzoin and Steri-Strips applied.  The patient went to the  recovery room in stable condition.  AT the end of the case, needle, sponge,  and instrument counts correct.                                               Donalee Citrin, M.D.    GC/MEDQ  D:  09/01/2003  T:  09/01/2003  Job:  [id number]

## 2011-01-21 NOTE — Assessment & Plan Note (Signed)
MEDICAL RECORD NUMBER:  [id number]   Mr. Semple returns to the clinic today for follow-up evaluation. The patient  has good new all around. 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 Instead, he was able to find a  custodian job with [employer]. 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   The patient reports that his blood sugar has been in the 108-128 range. He  reports that he has had a recent exacerbation of his gout and was given  colchicine and Indocin. He is due to follow up with Dr. Donette Larry, his primary  care physician. He does need refills on his medication in the office today.   MEDICATION:  1.  Flexeril 10 mg daily p.r.n.  2.  Neurontin 400 mg b.i.d.  3.  Oxycodone 5 mg two tablets b.i.d. p.r.n.  4.  Avandia 8 mg daily.  5.  Minocycline 100 mg daily.  6.  Glipizide 5 mg daily.  7.  Diovan/hydrochlorothiazide 160/12.5 one tablet daily.  8.  Lexapro 20 mg daily.  9.  Enteric-coated aspirin 81 mg daily.  10. Metformin 500 mg two tablets b.i.d.  11. Lipitor 10 mg q.h.s.  12. Restoril 15 mg q.h.s. p.r.n.   REVIEW OF SYSTEMS:  Noncontributory.   PHYSICAL EXAMINATION:  A reasonably well-appearing, overweight, well-  nourished adult male in no acute discomfort. Blood pressure 110/59 with a  pulse of 88, respiratory rate 16, and O2 saturation 97% on room air. He has  5/5 strength throughout the bilateral upper and lower extremities. Bulk and  tone were normal. Reflexes were 2+ and symmetrical. He ambulates without any  assistive device.   IMPRESSION:  Persistent but improved low back pain and right leg pain after  lumbar discectomy/fusion and foraminotomy, December 2004.   In the office today we did refill the patient's Lexapro and Neurontin. He  has sufficient supply of the oxycodone at the present time. He generally  uses four tablets per day of the oxycodone. We will plan on  seeing him in  follow-up in approximately 2 months' time with refill medication prior to  that appointment as necessary.           ______________________________  Ellwood Dense, M.D.     DC/MedQ  D:  05/13/2005 10:25:55  T:  05/13/2005 11:15:57  Job #:  [id number]

## 2011-01-21 NOTE — Op Note (Signed)
NAME:  Devon Wood, Devon Wood                           ACCOUNT NO.:  [id number]   MEDICAL RECORD NO.:  [id number]                   PATIENT TYPE:  OUT   LOCATION:  [facility]                                 FACILITY:  [facility]   PHYSICIAN:  Lindaann Slough, M.D.               DATE OF BIRTH:  [birth year]   DATE OF PROCEDURE:  09/02/2002  DATE OF DISCHARGE:                                 OPERATIVE REPORT   PREOPERATIVE DIAGNOSIS:  Left ureteral stone and bilateral renal stone.   POSTOPERATIVE DIAGNOSIS:  Left ureteral stone and bilateral renal stone.   OPERATION PERFORMED:  Cystoscopy and left retrograde pyelogram, ureteral  balloon dilation of intramural ureter, ureteroscopy with stone extraction  and insertion of double-J catheter.   SURGEON:  Lindaann Slough, M.D.   ANESTHESIA:  General.   INDICATIONS FOR PROCEDURE:  The patient is a 50 year old male who was seen  in the emergency department on September 01, 2002 with sudden onset of severe  left flank pain associated with nausea.  A CT scan of the abdomen and pelvis  showed a 5 x 3 mm stone in the left distal ureter and bilateral small renal  stones.  He was sent home with oral analgesics; however, he returned to the  emergency department this morning with severe pain.  He is scheduled now for  cystoscopy and stone manipulation.   DESCRIPTION OF PROCEDURE:  Under general anesthesia, the patient was prepped  and draped and placed in dorsal lithotomy position.  A #22  Wappler  cystoscope was inserted in the bladder.  The urethra was normal.  The  bladder mucosa was normal.  There was no stone or tumor in the bladder.  The  ureteral orifices were in normal position and shape.  A cone tipped catheter  was passed through the cystoscope into the left ureteral orifice.  Contrast  was then injected through the cone tipped catheter.  There was a filling  defect in the distal ureter.  The cone tipped catheter was then removed.  A  guidewire was passed  through the cystoscope into the left ureter.  The  cystoscope was then removed.  A rigid ureteroscope was then passed in the  bladder and in the ureteral orifice but the scope could not be passed beyond  the intramural ureter.  The ureteroscope was then removed.  The guidewire  was back loaded into the cystoscope and a balloon catheter was passed over  the guidewire and the intramural ureter was dilated.  The cystoscope and  balloon catheter were then removed.  The ureteroscope was then reinserted in  the bladder and passed in the ureter.  The stone was visualized in the  distal ureter and grasped with a stone basket and extracted.  The  ureteroscope was then inserted in the ureter and there was no evidence of  ureteral injury.  The uteroscope was then removed.  The guidewire  was then  back loaded into the cystoscope and a #6 [place] 26 double-J catheter was  passed over the guidewire.  The proximal coil of the catheter is in the  collecting system.  The distal coil is in the bladder.  the cystoscope was  then removed.   The patient tolerated the procedure well and left the operating room in  satisfactory condition for the post anesthesia care unit.                                                  Lindaann Slough, M.D.    MN/MEDQ  D:  09/02/2002  T:  09/02/2002  Job:  [id number]

## 2011-01-21 NOTE — Assessment & Plan Note (Signed)
HISTORY OF PRESENT ILLNESS:  Devon Wood returned to the clinic today for follow  up evaluation.  He reports that overall he is doing better.  He attributes  most of the improvement to the stress that is relieved since his Worker's  Compensation claim was recently settled.  He has obtained a lump sum and  been able to pay some bills along with putting some away for investment.  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  The patient is  interested in doing more full time work and has had a interview with [employer] for a management position.  This does not involve any physical work,  but mostly just computer work and walking.  The patient feels that he could  do this on a regular basis and is very interested in obtaining this job.   He reports that he would like to try weaning from the Neurontin and Lexapro  medication.  He presently takes Neurontin 800 mg q.i.d. and Lexapro 20 mg  one and a half tablets daily.  He continues to use Oxycodone 5 mg two  tablets b.i.d.  Uses Restoril on an as needed basis.  He reports that his  blood sugar has been in the 127 range.   MEDICATIONS:  1.  Flexeril 10 mg daily p.r.n.  2.  Neurontin 800 mg q.i.d.  3.  Oxycodone 5 mg two tablets b.i.d.  4.  Avandia 8 mg daily.  5.  Viagra 100 mg daily p.r.n.  6.  Minocycline 100 mg daily.  7.  Glipizide 5 mg daily.  8.  Diovan/hydrochlorothiazide 160/12.5 one tablet daily.  9.  Lexapro 20 mg one and a half tablets daily.  10. Enteric coated aspirin 81 mg daily.  11. Metformin 500 mg two tablets b.i.d.  12. Lipitor 10 mg q.h.s.  13. Restoril 15 mg q.h.s. p.r.n.   PHYSICAL EXAMINATION:  GENERAL APPEARANCE:  Reasonably well-appearing,  overweight adult male in no acute discomfort.  VITAL SIGNS:  Blood pressure 127/64, pulse 96, respirations 18, O2  saturation 98% on room air.  NEUROLOGICAL:  He is 5-/5 strength  throughout the bilateral upper and lower  extremities.  Bulk and tone were normal.  Reflexes were 2+ and symmetrical.  Sensation was intact to light touch throughout.   IMPRESSION:  Persistent low back and right leg pain after lumbar  diskectomy/fusion and foraminotomy in December 2004.   PLAN:  In the office today, we did not have to refill his Oxycodone.  We  discussed weaning his Neurontin, and have asked him to decrease down to 800  mg t.i.d. from q.i.d.  We have also asked him to decrease his Lexapro down  to one tablet daily instead of one and a half.  The patient continues to  look for gainful employment on a full time basis, and I encouraged him in  that matter.  We plan on seeing the patient in follow up in this office in  approximately 4-8 weeks time.     DC/MedQ  D:  01/17/2005 10:17:53  T:  01/17/2005 11:36:54  Job #:  [id number]

## 2011-01-21 NOTE — Assessment & Plan Note (Signed)
[name] returns to clinic today for followup evaluation.  He continues to do  well overall.  He is using anywhere from 4-6 tablets of his oxycodone per  day and recently had a refill.  He does not need anything done in regard to  that medication.  He is asking for sleeping medication for short-term  insomnia.  He is reporting that he may be moved to daylight shift from the  second shift and feels that he might need probably short-term sleeping  medication.  He also needs a refill on his Lexapro in the office today.   The patient reports that he continues to get good relief from the oxycodone  for his back and leg pain.   MEDICATIONS:  1.  Flexeril 10 mg daily p.r.n.  2.  Oxycodone 5 mg 1-2 tablets p.o. q.i.d. p.r.n. (4-6 per day).  3.  Avandia 8 mg daily.  4.  Minocycline 100 mg daily.  5.  Glipizide 5 mg daily.  6.  Diovan/hydrochlorothiazide 160/12.5 one tablet daily.  7.  Lexapro 20 mg daily.  8.  Enteric-coated aspirin 81 mg daily.  9.  Metformin 500 mg b.i.d.  10. Lipitor 10 mg q.h.s.  11. Allopurinol daily.   REVIEW OF SYSTEMS:  Noncontributory.   PHYSICAL EXAM:  GENERAL:  Well-appearing, moderately overweight adult male  in mild-to-no acute discomfort.  VITAL SIGNS:  Blood pressure 137/70 with a pulse of 68, respiratory rate 17  and O2 saturation 100% on room air.  NEUROLOGIC:  He has 5/5 strength throughout the bilateral upper and lower  extremities.  He ambulates without any assistive devices.  Lumbar range of  motion was decreased in all planes.   IMPRESSION:  Persistent low back and right leg pain after lumbar  discectomy/fusion/foraminotomy December 2004.   In the office today, no refill on the oxycodone is necessary.  I did give  him a new script for the Ambien 10 mg daily, along with Lexapro with  refills.  We will plan on seeing the patient in follow up in this office in  approximately 3 months' time.  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           ______________________________  Ellwood Dense, M.D.     DC/MedQ  D:  01/26/2006 09:27:48  T:  01/26/2006 13:55:51  Job #:  [id number]

## 2011-01-21 NOTE — Op Note (Signed)
[facility]  Patient:    Devon Wood, Devon Wood                        MRN: [id number] Proc. Date: 11/29/00 Adm. Date:  [id number] Attending:  Pierce Crane                           Operative Report  PREOPERATIVE DIAGNOSES:  Herniated nucleus pulposus, lateral recessed stenosis.  POSTOPERATIVE DIAGNOSES:  Herniated nucleus pulposus, lateral recessed stenosis.  PROCEDURE:  Bilateral hemilaminotomy, lateral recessed decompression, microdiskectomy.  ANESTHESIA:  General.  ASSISTANT:  Dr. Montez Morita.  BRIEF HISTORY:  A 50 year old with bilateral lower extremity radicular pain, right greater than left. MRI indicating HNP, lateral recesses stenosis central and to the right for his HNP. Due to his bilateral symptoms, operative intervention was indicated for bilateral hemilaminotomy and decompression. The risks and benefits were discussed including bleeding, infection, damage to neurovascular structures and no change in symptoms, need for fusion in the future, etc.  TECHNIQUE:  The patient in supine position after induction of adequate general anesthesia and 1 gm of Kefzol IV for antimicrobial prophylaxis. The patient was placed supine on the [place] frame, all bony prominences well padded. The lumbar regions were prepped and draped in the usual sterile fashion. Two 18 gauge spinal needles were utilized to localize the 5-1 interspace, confirmed with an x-ray. Incisions were made from the spinous process of 5 to S1. A 0.25% Marcaine was epinephrine was infiltrated in the subcutaneous tissue. The dorsolumbar fascia was identified and divided in line with skin incisions all either side of the interspinous ligament. The paraspinous muscles were elevated from the lamina of 5 and S1 bilaterally. A McCullough retractor was placed, operating microscope draped and brought onto the surgical field. Straight curettes were utilized to detach the ligamentum flavum from  the cephalad edge of S1 bilaterally. Neural patties were then placed beneath the ligamentum flavum protecting the neural elements. The ligamentum flavum was then removed from the interspace bilaterally. Attention was turned towards the right. There was significant lateral recessed stenosis and the Penfield four gently mobilizing the S1 nerve root medially. The lateral recess was decompressed with the 2 mm Kerrison, foraminotomy was then performed. An HNP was then noted so an annulotomy was performed. Copious portion of disk material was removed from the interspace, further mobilized with a nerve hook and a large fragment then removed from the central portion of the disk. A hockey stick probe was then placed in the disk space to further mobilize disk material. A full diskectomy was performed without any evidence of residual disk herniation noted. A hockey stock probe placed in the foramen of 5 and S1 and found to be widely patent following decompression of the S1 nerve root and the 5 roots were fully mobile without evidence of residual compression. In a similar fashion, the laminar space on the left was opened and again significant lateral recessed stenosis was noted and with a Penfield four the S1 nerve root was gently mobilized medially and a 2 mm Kerrison was utilized to decompress the lateral recess performing a L5 foraminotomy. There was extensive venous plexus, there was no evidence of bleeding, however, and therefore these vessels were left in situ. The disk was examined from this side and there was no rupture onto the left side noted. Reinspection of the right side revealed no evidence of residual disk. Both sides were copiously irrigated.  Inspection revealed no evidence of active bleeding or CSF leakage. The McCullough retractor was removed, paraspinous muscles inspected with no evidence of active bleeding. A confirmatory radiograph was obtained to confirm the L5-S1 space. There was  good epidural fat placed over the dura and thecal sac. One patty of thrombin soaked Gelfoam was placed on the right. The dorsolumbar fascia was reapproximated to the interspinous ligament and spinous process with #1 Vicryl interrupted figure-of-eight sutures. The subcutaneous tissue was reapproximated with 2-0 Vicryl simple sutures, the skin was reapproximated with staples, wound dressed sterilely. The patient supine on the hospital bed, extubated without difficulty, transported to the recovery room in satisfactory condition.  The patient tolerated the procedure well with no complications. Blood loss minimal. DD:  11/29/00 TD:  11/30/00 Job: [id number]

## 2011-01-21 NOTE — Assessment & Plan Note (Signed)
Devon Wood returns to clinic today for follow-up evaluation.  He reports  that his back pain is doing well on his p.r.n. oxycodone.  He did  develop some plantar fascitis of the left foot and saw a podiatrist.  They asked him to start using shoe inserts and the shoe inserts actually  caused Achilles tendon inflammation.  He has stopped using this insert  and his foot is gradually returning to normal.  He is also weaned off  Lexapro.  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  He is using only minimal amounts of Flexeril  at this time.  He reports that his blood sugar has been in the 108 to  139 range.   MEDICATIONS:  1. Flexeril 10 mg daily p.r.n.  2. Oxycodone 5 mg 1-2 tablets p.o. q.i.d. p.r.n. (approximately 4-8      per day).  3. Avandia 8 mg daily.  4. Glipizide 5 mg daily.  5. Diovan/hydrochlorothiazide 160/12.5 one tablet daily.  6. Enteric-coated aspirin 81 mg daily.  7. Metformin 500 mg b.i.d.  8. Lipitor 10 mg daily.  9. Allopurinol daily.  10.Minocycline 100 mg daily.   REVIEW OF SYSTEMS:  Noncontributory.   PHYSICAL EXAMINATION:  A well-appearing, moderately overweight, well-  nourished adult male in mild to no acute discomfort.  Blood pressure  130/68 with pulse of 101, respiratory rate 18 and O2 saturation 96% on  room air.  He has 5/5 strength throughout the bilateral upper and lower  extremities.  He does have some slight pain in the left Achilles region.   IMPRESSION:  1. Persistent low back pain and right leg pain after lumbar      discectomy/fusion/foraminotomy December 2004.  2. In the office today, no refill on medication is necessary.  We will      plan on seeing the patient in follow-up in this office in      approximately 2-3 months' time with refills prior to that      appointment as necessary.           ______________________________  Ellwood Dense, M.D.     DC/MedQ  D:  10/23/2006 16:01:15  T:   10/24/2006 02:02:30  Job #:  [id number]

## 2011-01-21 NOTE — Discharge Summary (Signed)
NAME:  ZARIF, RATHJE                           ACCOUNT NO.:  [id number]   MEDICAL RECORD NO.:  [id number]                   PATIENT TYPE:  INP   LOCATION:  3011                                 FACILITY:  [facility]   PHYSICIAN:  Donalee Citrin, M.D.                     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   PROCEDURE:  Decompressive laminectomy and posterior lumbar interbody fusion  L5-S1.   HISTORY OF PRESENT ILLNESS:  The patient is a very pleasant 50 year old  gentleman who has longstanding back and leg pain after work related injury,  had a large ruptured disc.  The patient was recommended after failing  conservative treatment, for decompressive stabilization procedure.  Discussed the risks and benefits of surgery with him and he understands and  wishes to move forward.  The patient was admitted to the hospital and he  underwent the aforementioned procedure.  Postoperatively, the patient did  very well.  Because of his cardiac history with previous asystolic arrest on  the table and induction of anesthesia, the patient was placed in the ICU  postoperatively.  The patient did very well and was progressively mobilized.  He was doing very well with resolution of his preoperative leg pain.  Strength was 5/5.  His Hemovac progressively decreased and declined in  output, was able to be taken out on day #2.  We managed his pain on  OxyContin and Robaxin.  Mobilized with physical and occupational therapy.  By hospital day #4, the patient was stable to be discharged home.   At the time of discharge, he had no leg pain, was having some decreased  incisional pain, was managed on his oral medications.  He was ambulating and  voiding spontaneously.  The patient was discharged home with  scheduled  follow-up in two weeks.                                                Donalee Citrin, M.D.    GC/MEDQ  D:  09/24/2003  T:  09/24/2003  Job:  [id number]

## 2011-01-21 NOTE — Assessment & Plan Note (Signed)
MEDICAL RECORD NUMBER:  [id number].   DATE OF BIRTH:  1960-11-30.   Devon Wood returns to [facility] clinic today for further evaluation.  He is a 50-year-  old adult male referred to this office by Dr. Wynetta Emery for treatment of chronic  lumbar pain.   I first and last saw Devon Wood in this office on June 25, 2004.  At  that time, Devon Wood was being treated with hydrocodone along with  periodic Endocet.  He was not interested in any stronger medicines at that  point.  He also was maintained on Neurontin 800 mg q.i.d.  We did maintain  Neurontin during Devon last clinic visit.  We also allowed him to use Devon  Flexeril on an as needed basis, but he is not using that very much at this  point.  He does use his hydrocodone 7.5/325 mg approximately six tablets per  day and gets some relief so that he can function, according to his reports.  He is not reporting any grogginess and would like a refill on Devon  hydrocodone in [facility] office today.  He reports that he tries to do what he can  around Devon home.  He denies any bowel or bladder incontinence.   PHYSICAL EXAMINATION:  A reasonably well-appearing, obese, adult male who  reports a fair amount of pain in his lumbar region in [facility] office today.  Devon  blood pressure was 112/61 with a pulse of 95, respiratory rate 20 and O2  saturation 98% on room air.  He shows very limited lumbar range of motion in  Devon standing position.  Manual muscle testing revealed at least 4+ to 5-/5  strength throughout Devon bilateral lower extremities.  Upper extremity  strength was 5/5 throughout.  Reflexes were 2+ and symmetrical in Devon  bilateral lower extremities and 1+ in Devon bilateral lower extremities.  He  ambulates without any assistive device in [facility] office today.   IMPRESSION:  1.  Chronic pain, status post lumbar fusion with prior lumbar diskectomy and      foraminotomy.  2.  Noninsulin-dependent diabetes mellitus.  3.  Hypertension.  4.  Obesity.   In [facility]  office today, no refill on Devon Neurontin is necessary.  He also has  sufficient Flexeril.  Will refill Devon hydrocodone as of today, July 26, 2004.  Will plan on seeing him in followup in approximately two months'  time.  He has been encouraged to do his home exercise program, including Devon  exercises previously given to him by his physical therapist.       DC/MedQ  D:  07/26/2004 10:05:59  T:  07/26/2004 12:46:07  Job #:  [id number]

## 2011-01-21 NOTE — Assessment & Plan Note (Signed)
Devon Wood returns to clinic today for followup evaluation. He is coming in  ahead of his scheduled time which had been approximately the third week of  January 2006.   The patient reports that he had acute onset of increased low back pain while  he was throwing ball with his son on Thanksgiving Day. He reports that he to  take extra hydrocodone and was taking it approximately eight tablets a day  compared to his baseline which is four tablets a day. He reports that he  wanted to make sure that this was noted as he is concerned return to work  issues.   The patient reports that his back pain did ease over the past 5 to 7 days,  but he still has occasional sharp pain. He reports that he tries to do his  stretching exercises daily including use of the TENS unit and also the  therapy ball. The patient reports that he also uses his bone stimulator and  muscle stimulator on a regular basis.   MEDICATIONS:  1.  Flexeril 10 mg q.d.  2.  Neurontin 800 mg q.i.d.  3.  Hydrocodone 10.5/325 one tablet q.i.d. p.r.n. (had been up to 8 per      day).  4.  Other medicines unchanged.   PHYSICAL EXAMINATION:  Ill appearing, obese, adult male in a fair amount of  pain in the office today. Blood pressure was 134/69 with a pulse of 102,  respiratory rate 16, and O2 saturation 97% on room air. He has 5-/5 strength  towards the bilateral upper and lower extremities. He has decreased lumbar  range of motion in all planes. Reflexes were 2+ and symmetrical.   IMPRESSION:  1.  Chronic pain, status post lumbar fusion with prior lumbar diskectomy and      foraminotomy.  2.  Noninsulin-dependent diabetes mellitus.  3.  Hypertension.  4.  Obesity.   In the clinic today, we did refill the hydrocodone as of December 15 at 1  tablet q.i.d. It does appear that his acute exacerbation is calming down  now. I have told him that if he does have an acute exacerbation which  requires increased pain medicines he needs to  let us know through this  office. We may increase his pain medicines for a very short period of time  as he has done on his own, but I would like to be informed. He also reports  poor sleep and has reported that he has tried Ambien along with Sonata in  the past. Neither of those worked for any extended period of time according  to his reports. We will try Trazodone 50 mg 1 tablet q.h.s. for a week and  then increase to 2 tablets q.h.s. We will plan on seeing him in followup in  approximately two months' time.       DC/MedQ  D:  08/12/2004 13:11:51  T:  08/13/2004 07:55:44  Job #:  [id number]

## 2011-01-21 NOTE — Assessment & Plan Note (Signed)
MEDICAL RECORD NUMBER:  [id number]   Mr. Cocco returns to the clinic today for follow-up evaluation. 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 He is using his  oxycodone 5 mg and generally uses four tablets per day. He reports that his  blood sugars have been less than 130. He has lost some weight over the past  few months but he is not exactly sure. He did pass a kidney stone this past  Thursday and had a fair amount of pain related to that. He continues to take  Flexeril only on an as-needed basis.   MEDICATIONS:  1.  Flexeril 10 mg daily p.r.n.  2.  Oxycodone 5 mg two tablets p.o. b.i.d. p.r.n.  3.  Avandia 8 mg daily.  4.  Minocycline 100 mg daily.  5.  Glipizide 5 mg daily.  6.  Diovan/hydrochlorothiazide 160/12.5 one tablet daily.  7.  Lexapro 20 mg daily.  8.  Enteric-coated aspirin 81 mg daily.  9.  Metformin 500 mg b.i.d.  10. Lipitor 10 mg q.h.s.  11. Restoril 15 mg q.h.s. p.r.n.  12. Allopurinol daily.   REVIEW OF SYSTEMS:  Noncontributory.   PHYSICAL EXAMINATION:  Reasonably well-appearing, well-nourished, overweight  adult male in no acute distress. Blood pressure 119/66 with a pulse of 80,  respiratory rate 16, and O2 saturation 98% on room air. He has 5/5 strength  throughout the bilateral upper and lower extremities. Bulk and tone were  normal and reflexes were 2+ and symmetrical. He ambulates without any  assistive device. Lumbar range of motion was decreased in all planes.   IMPRESSION:  Persistent low back pain and right leg after lumbar  diskectomy/fusion/foraminotomy December 2004.   In the office today, no refill on medication is necessary. He is doing well  on minimal amounts of oxycodone and rare Flexeril. He is off the Neurontin  completely at this time. We will plan on seeing him in follow-up in  approximately 3 months' time with refill of medication prior to that  appointment as necessary.     ______________________________  Ellwood Dense, M.D.     DC/MedQ  D:  10/27/2005 10:09:05  T:  10/27/2005 16:04:23  Job #:  [id number]

## 2011-01-21 NOTE — Cardiovascular Report (Signed)
NAME:  Devon Wood, Devon Wood                           ACCOUNT NO.:  [id number]   MEDICAL RECORD NO.:  [id number]                   PATIENT TYPE:  OIB   LOCATION:  6523                                 FACILITY:  [facility]   PHYSICIAN:  Darlin Priestly, M.D.             DATE OF BIRTH:  1961/07/01   DATE OF PROCEDURE:  05/30/2003  DATE OF DISCHARGE:                              CARDIAC CATHETERIZATION   PROCEDURES:  1. Coronary angiography.  2. Left anterior descending-proximal.     a. Intravascular ultrasound imaging.     b. Placement of intracoronary stent.   ATTENDING PHYSICIAN:  Darlin Priestly, M.D.   COMPLICATIONS:  None.   INDICATIONS:  Devon Wood is a 50 year old male patient of Dr. Daryl Wood who was  admitted April 29, 2003 for elective lumbar laminectomy.  The patient  received general anesthesia and then had a 20-30 second episode of asystole.  He was on atenolol at that time.  He subsequently had 2-D echocardiogram  which revealed normal LV function with LVH.  Cardiolite scan revealed mild  anterior wall ischemia and subsequently underwent cardiac catheterization on  May 14, 2003 revealing significant 80% proximal LAD lesion.  He is now  brought for percutaneous intervention of the LAD.   DESCRIPTION OF OPERATION:  After obtaining informed written consent, the  patient was brought to the cardiac catheterization laboratory.  Right and  left groin was shaved and prepped and draped in the usual sterile fashion.  ECG monitor was established.  Using the modified Seldinger technique, a 7  French arterial sheath was inserted in the right femoral artery. Next, a  Voda 3.5 guiding catheter with side holes and __________ engaged in the left  coronary ostium.  Next, a 0.014 Forte guide wire was __________ guiding  catheter and measurements were made.  The guide wires were then positioned  in the distal LAD without difficulty.  Next, a 40 megahertz IVUS imaging  catheter was then  advanced by the guiding catheter into the mid-LAD and  pullback was then performed. This revealed a 3.0 x 3.2 vessel diameter with  a 1.5 x 1.5 luminal diameter within the obstructed area.  This imaging  catheter was then removed a Cypher 2.5 x 28-mm stent was then tracked across  the stenotic lesion.  The stent was then deployed to a maximum of 16  atmospheres for a total of 63 seconds.  Followup angiogram revealed no  evidence of dissection or thrombus with TIMI-3 flow to the distal vessel.  Next, a Quantum Ranger 2.7 x 20-mm balloon was then placed within the stent  and two subsequent inflations to 20 atmospheres was performed for a total of  one minute and 30 seconds to a maximum diameter of 2.9 mm.  Followup  angiogram revealed no evidence of dissection or thrombus with TIMI-3 flow to  the distal vessel.  There was a 70% apical LAD  lesion.  IV Angiomax was used  throughout the case.   Final orthogonal angiograms revealed less than 10% residual stenosis in the  proximal LAD stenotic lesion with TIMI-3 flow to the distal vessel.  This  followed the conclusion of the procedure.  All balloons, wires and catheters  were removed.  Hemostatic sheath was then placed and patient brought back to  the ward in stable condition.    CONCLUSION:  1. Successful intravascular ultrasound imaging and placement of a Cypher 2.5     x 28-mm coronary stent ultimately dilated to 2.9 mm in the proximal left     anterior descending stenotic lesion.  2. Adjunct use of Angiomax infusion.                                                 Darlin Priestly, M.D.    RHM/MEDQ  D:  05/30/2003  T:  05/31/2003  Job:  [id number]   cc:   Dr. Daryl Wood

## 2011-01-21 NOTE — Assessment & Plan Note (Signed)
MEDICAL RECORD NUMBER:  [id number]   [name] returns to the clinic today for follow-up evaluation. 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  He had to carry trash up and down stairs and developed some spasms in his  back. He did take some extra Flexeril along with some pain medicines last  week and he reports that now he is back to his usual dose of oxycodone 5 mg  two tablets twice a day. He reports that he has lost three pant sizes but is  unsure of exactly the amount of weight that he has lost with his increased  activity. He reports that he has sufficient supply of his oxycodone,  Flexeril, and Neurontin at the present time. He reports that his blood  sugars have been in the 109-126 range.   The patient has recently been started back on allopurinol by his primary  care physician for gout involving his left ankle.   MEDICATIONS:  1.  Flexeril 10 mg daily p.r.n.  2.  Neurontin 400 mg b.i.d.  3.  Oxycodone 5 mg two tablets b.i.d. p.r.n.  4.  Avandia 8 mg daily.  5.  Minocycline 100 mg daily.  6.  Glipizide 5 mg daily.  7.  Diovan/hydrochlorothiazide 160/12.5 one tablet daily.  8.  Lexapro 20 mg daily.  9.  Enteric-coated aspirin 81 mg daily.  10. Metformin 500 mg b.i.d.  11. Lipitor 10 mg q.h.s.  12. Restoril 15 mg q.h.s. p.r.n.  13. Allopurinol daily.   REVIEW OF SYSTEMS:  Noncontributory.   PHYSICAL EXAMINATION:  Reasonably well-appearing, well-nourished, overweight  adult male in mild to no acute discomfort. Blood pressure 131/85 with pulse  67, respiratory rate 16, and O2 saturation 99% on room air. He has 5/5  strength throughout the bilateral upper and lower extremities. Bulk and tone  were normal and reflexes were 2+ and symmetrical. He ambulates without any  assistive device.   IMPRESSION:  Persistent low back pain and right leg pain after lumbar  discectomy/fusion/foraminotomy December 2004.   In the office today no refill on medication is necessary. We will plan on  seeing him in follow-up in approximately 2-3 months' time with refill on  medication prior to that appointment as necessary.           ______________________________  Ellwood Dense, M.D.     DC/MedQ  D:  08/08/2005 11:45:19  T:  08/08/2005 16:21:59  Job #:  [id number]

## 2011-01-21 NOTE — Assessment & Plan Note (Signed)
HISTORY OF PRESENT ILLNESS:  Devon Wood returns to the clinic today for follow  up evaluation.  He reports that he is doing very well overall.  He settled  his case in early April 2006 and reports that there is a fair amount of  stress release with that settlement.  He has been able to be more active  recently and is actually considering return to employment.  He needs  approval from Dr. Wynetta Emery, his neurosurgeon, as he is considering a job with  [employer] pain company.  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  The usual amount will be  up to 25-30 pounds, but he can certainly open the box and carry individual  cans of pain if necessary.  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  He is not interested in any indoor work, but needs to  be active.  He actually reports that most of his pain is in he night when he  is less active.  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  Unfortunately, with warmer weather, he is noticing decreased ability to do  that type of work outside on a regular basis.  The patient reports that he  is using his Oxycodone and only on an as needed basis and generally takes  two tablets twice a day.  He had been up to two tablets four times a day,  but has been able to decrease down to twice a day.  He does not need a  refill on his medication in the office today.  He continues to take the  Neurontin, but has actually decreased the dose down to 400 mg b.i.d. from  800 mg q.i.d.  He reports that his blood sugars have been less than 130 on a  regular basis.   MEDICATIONS:  1.  Flexeril 10 mg daily p.r.n.  2.  Neurontin 400 mg b.i.d.  3.  Oxycodone 5 mg two tablets b.i.d. p.r.n.  4.  Avandia 8 mg daily.  5.  Minocycline 100 mg  daily.  6.  Glipizide 5 mg daily.  7.  Diovan/hydrochlorothiazide 160/12.5 one tablet daily.  8.  Lexapro 20 mg one tablet daily.  9.  Enteric coated aspirin 81 mg daily.  10. Metformin 500 mg two tablets b.i.d.  11. Lipitor 10 mg q.h.s.  12. Restoril 15 mg q.h.s. p.r.n.   REVIEW OF SYSTEMS:  Noncontributory.   PHYSICAL EXAMINATION:  GENERAL APPEARANCE:  Reasonably well-appearing,  overweight adult male in no acute discomfort.  VITAL SIGNS:  Blood pressure 142/76, pulse 78, respirations 16, O2  saturation 98% on room air.  EXTREMITIES:  He as 5/5 strength throughout the bilateral upper and lower  extremities.  Bulk and tone were normal and reflexes were 2+ and  symmetrical.  Sensation was intact to light touch throughout.  Uses no  assisted device during ambulation.   IMPRESSION:  Persistent but improved low back pain and right leg pain after  lumbar discectomy/fusion and foraminotomy December 2004.   PLAN:  In the office today, no refill of medications is necessary.  He  continues to wean the medications on his own and has actually decreased his  Neurontin dose substantially while also decreasing his Oxycodone.  He is  being more active and is interested in returning to work as noted above.  We  will plan on seeing the patient in follow up in approximately two months  time.  We will be seeking approval for work release from Dr. Wynetta Emery, his  neurosurgeon.  I  certainly agree with that plan for return to work, especially with the  limited lifting abilities and his limited work description.       DC/MedQ  D:  03/14/2005 11:07:36  T:  03/14/2005 11:38:35  Job #:  [id number]

## 2011-01-21 NOTE — Assessment & Plan Note (Signed)
Devon Wood returns to the clinic today, accompanied by his rehabilitation  nurse, Ms. Sharilyn Ward.   The patient reports that he has been using his hydrocodone 7.5/325.  He  generally takes two tablets twice a day, although the original prescription  was one tablet four times a day.  He reports that he does occasionally use  the medication more than prescribed and occasionally goes up to as many as  eight tablets per day.  He had been asked to remain at four tablets, but he  reports that he is not getting much relief with the hydrocodone.  He is  taking Neurontin 800 mg q.i.d., and he takes Flexeril on an as needed daily  basis.   Apparently, the patient did follow up with Dr. Wynetta Emery back in December 2005  and January 2006.  During the December office visit, they had decided to  proceed with MRI scan of his lumbar spine.  That showed bulges at L2-L3 with  scar formation at the surgical site i.e., L5-S1.  No significant surgical  complication was noted and the hardware was noted to be in stable condition.  No surgical lesion was noted.  When the patient saw Dr. Wynetta Emery in followup in  January 2006, Dr. Wynetta Emery suggested a possible EMG and nerve conduction studies  but I do not have that official note.   The patient reports the he has lost 10 pounds since the last clinic visit.  He reports that he can only sit 20 minutes at a time at the present point.  In terms of his sleep, the Trazodone used up to 100 mg per day did not give  him any relief.  He had previously tried Ambien and Sonata without benefit.  The patient reports numbness of  his right buttock which is present on a  daily basis.  He also reports that he uses his TENS unit approximately daily  and gets relief for 20-30 minutes after each use.   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  This is located in  [place], [place].  The patient does live on the [place] [facility]  [place] but this drive would be close to an hour a day one way only.  That is  problematic for him.  Certainly, if there was something in that same type of  work that was available in [place] at [facility] [employer] [facility] or possibly at  [facility] or even at [employer], that would be reasonable  in terms of distance to drive.   MEDICATIONS:  1.  Flexeril 10 mg every day.  2.  Neurontin 800 mg q.i.d.  3.  Hydrocodone 7.5/325 mg one tablet q.i.d. (uses two tablets b.i.d. at      least).  4.  Avandia 8 mg every day.  5.  Viagra 100 mg every day.  6.  Minocycline 100 mg every day.  7.  Glipizide 5 mg every day.  8.  Diovan/hydrochlorothiazide 160/12.5, one tablet every day.  9.  Lexapro 20 mg one and a half tablets every day.  10. Enteric coated aspirin 81 mg every day.  11. Metformin 500 mg two tablets b.i.d.  12. Lipitor 10 mg one tablet q.a.m.  13. Colchicine (not using at present).   PHYSICAL EXAMINATION:  GENERAL:  An overweight adult male in moderate  to  acute discomfort.  VITAL SIGNS:  Blood pressure 135/65 with a pulse of 106, respiratory rate  20, and O2 saturation 95% on room air.  MUSCULOSKELETAL:  He has 5/5 strength throughout the bilateral upper  extremities.  Left lower extremity strength was 5-/5 and right lower  extremity strength was generally 4 to 4+ over 5 in hip flexion, knee  extension, and ankle dorsiflexion.  He does report occasional weakness of  his right leg but he has had no falls that he tells me about.  He does  report decreased sensation and especially of the sole of his right foot and  his right buttock region.   IMPRESSION:  1.  Chronic pain, status post lumbar fusion with prior lumbar diskectomy and      foraminotomy.  2.  Non-insulin-dependent diabetes mellitus.  3.  Hypertension.  4.  Obesity.   PLAN:  In the clinic today,   1.  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      If that type of work could be found within a 20-30 minute drive, I think      that is more reasonable for this patient and his ongoing problems.  2.  Fortunately, the patient has lost weight with the diet only,      unfortunately his pain control was not adequate to allow him increased      activity.  With that in mind, we have switched him from hydrocodone and      started him on oxycodone to be taken 5 mg one tablet p.o. t.i.d. p.r.n.      That would be in place of his hydrocodone.  3.  We have also discontinued his trazodone and started him on Restoril 15      mg one tablet p.o. q.h.s. p.r.n.  4.  We will plan on seeing the patient in followup in this office in      approximately one to two months' time with refills of medication prior      to that appointment as necessary.      DC/MedQ  D:  10/13/2004 14:27:14  T:  10/13/2004 16:22:31  Job #:  [id number]   cc:   Donette Larry, R.N., B.S.N.  Meical Case Manager  [address]  [address]  [place], [place] [address]

## 2011-01-21 NOTE — H&P (Signed)
NAME:  Devon Wood, Devon Wood                           ACCOUNT NO.:  [id number]   MEDICAL RECORD NO.:  [id number]                   PATIENT TYPE:  INP   LOCATION:  2108                                 FACILITY:  [facility]   PHYSICIAN:  Donalee Citrin, M.D.                     DATE OF BIRTH:  [birth year]   DATE OF ADMISSION:  04/29/2003  DATE OF DISCHARGE:                                HISTORY & PHYSICAL   ADMISSION DIAGNOSES:  1. Diabetes.  2. Cardiac instability.   HISTORY OF PRESENT ILLNESS:  The patient is a very pleasant 50 year old  gentleman who was admitted to the hospital as an EMA was scheduled to  undergo lumbar fusion.  He was taken back to the OR for induction and on  induction went asystolic.  Preoperatively, the patient was noted to have  elevated sugars.  This is going to be managed through [facility] during the  time of the operation; however, with the combination of sugars in the 300s  and asystole on induction of anesthesia, the case was canceled.  The patient  has been sent to the recovery room for evaluation by cardiology and  medicine.  Currently, the patient is not complaining of any chest pain, just  his lumbar radiculopathy.   PAST MEDICAL HISTORY:  1. Hypertension.  2. Kidney stones.  3. Low back pain.  4. Lumbar radiculopathy.  5. Status post lumbar laminectomy on the right S1 radiculopathy.  6. He has a tendon repair of his right thumb.  7. Tonsillectomy and adenoidectomy.  8. Cystoscopy for stone extraction.  9. The last stress test he had performed was in 2000 and apparently was     okay.  10.      He stated that he had a diagnosis of diabetes four years ago,     however, on oral control, sugars normalized and they took him off his     medicine.   PHYSICAL EXAMINATION:  GENERAL APPEARANCE:  The patient is awake and alert.  HEENT:  Within normal limits.  NECK:  Supple.  LUNGS:  Clear.  CARDIOVASCULAR:  Heart is regular.  NEUROLOGIC:  He has 5+ strength,  normal symmetric reflexes and sensation  with absent ankle jerk on the right and positive straight leg raising on the  right.   ASSESSMENT AND PLAN:  This is a 50 year old gentleman with diabetes and  cardiac instability, admitted for lumbar fusion; however, case was canceled  due to asystole and elevated sugars.  The patient to be admitted to 3100  for telemetry.  He will be observed and worked up with cardiac enzymes per  Cardiology, Dr. Lenise Herald consulting, as well as [facility] for  medical management of his diabetes and hypertension.  When the patient is  stabilized from a cardiac standpoint and medical standpoint, we will  reschedule his lumbar fusion operation.  Donalee Citrin, M.D.    GC/MEDQ  D:  04/29/2003  T:  04/29/2003  Job:  [id number]

## 2011-09-07 ENCOUNTER — Other Ambulatory Visit: Payer: Self-pay

## 2011-09-07 ENCOUNTER — Emergency Department (HOSPITAL_COMMUNITY): Payer: No Typology Code available for payment source

## 2011-09-07 ENCOUNTER — Encounter: Payer: Self-pay | Admitting: Emergency Medicine

## 2011-09-07 ENCOUNTER — Emergency Department (HOSPITAL_COMMUNITY)
Admission: EM | Admit: 2011-09-07 | Discharge: 2011-09-07 | Disposition: A | Discharge status: Home / Self Care | Payer: No Typology Code available for payment source | Attending: Emergency Medicine | Admitting: Emergency Medicine

## 2011-09-07 DIAGNOSIS — M545 Low back pain, unspecified: Secondary | ICD-10-CM | POA: Insufficient documentation

## 2011-09-07 DIAGNOSIS — R7401 Elevation of levels of liver transaminase levels: Secondary | ICD-10-CM | POA: Insufficient documentation

## 2011-09-07 DIAGNOSIS — R748 Abnormal levels of other serum enzymes: Secondary | ICD-10-CM

## 2011-09-07 DIAGNOSIS — M25539 Pain in unspecified wrist: Secondary | ICD-10-CM | POA: Insufficient documentation

## 2011-09-07 DIAGNOSIS — R7402 Elevation of levels of lactic acid dehydrogenase (LDH): Secondary | ICD-10-CM | POA: Insufficient documentation

## 2011-09-07 DIAGNOSIS — M25519 Pain in unspecified shoulder: Secondary | ICD-10-CM | POA: Insufficient documentation

## 2011-09-07 DIAGNOSIS — S335XXA Sprain of ligaments of lumbar spine, initial encounter: Secondary | ICD-10-CM | POA: Insufficient documentation

## 2011-09-07 DIAGNOSIS — IMO0002 Reserved for concepts with insufficient information to code with codable children: Secondary | ICD-10-CM | POA: Insufficient documentation

## 2011-09-07 DIAGNOSIS — R000 Tachycardia, unspecified: Secondary | ICD-10-CM

## 2011-09-07 DIAGNOSIS — S39012A Strain of muscle, fascia and tendon of lower back, initial encounter: Secondary | ICD-10-CM

## 2011-09-07 DIAGNOSIS — R110 Nausea: Secondary | ICD-10-CM | POA: Insufficient documentation

## 2011-09-07 HISTORY — DX: Essential (primary) hypertension: I10

## 2011-09-07 LAB — CBC
HCT: 43.2 % (ref 39.0–52.0)
MCV: 90.4 fL (ref 78.0–100.0)
RBC: 4.78 MIL/uL (ref 4.22–5.81)
WBC: 9.9 10*3/uL (ref 4.0–10.5)

## 2011-09-07 LAB — HEPATIC FUNCTION PANEL
AST: 39 U/L — ABNORMAL HIGH (ref 0–37)
Albumin: 4.1 g/dL (ref 3.5–5.2)
Total Protein: 7.9 g/dL (ref 6.0–8.3)

## 2011-09-07 LAB — BASIC METABOLIC PANEL
CO2: 25 mEq/L (ref 19–32)
Calcium: 9.1 mg/dL (ref 8.4–10.5)
GFR calc non Af Amer: >90 mL/min (ref >90)
Potassium: 4.2 mEq/L (ref 3.5–5.1)
Sodium: 134 mEq/L — ABNORMAL LOW (ref 135–145)

## 2011-09-07 MED ORDER — IBUPROFEN 200 MG PO TABS
600.0000 mg | ORAL_TABLET | Freq: Once | ORAL | Status: AC
  Administered 2011-09-07: 600 mg via ORAL

## 2011-09-07 MED ORDER — SODIUM CHLORIDE 0.9 % IV BOLUS (SEPSIS)
1000.0000 mL | Freq: Once | INTRAVENOUS | Status: AC
  Administered 2011-09-07: 1000 mL via INTRAVENOUS

## 2011-09-07 MED ORDER — IBUPROFEN 800 MG PO TABS: 800.0000 mg | ORAL_TABLET | Freq: Three times a day (TID) | ORAL | Status: AC

## 2011-09-07 MED ORDER — OXYCODONE-ACETAMINOPHEN 5-325 MG PO TABS
2.0000 | ORAL_TABLET | Freq: Once | ORAL | Status: AC
  Administered 2011-09-07: 2 via ORAL
  Filled 2011-09-07: qty 2

## 2011-09-07 MED ORDER — HYDROCODONE-ACETAMINOPHEN 5-500 MG PO TABS: 1.0000 | ORAL_TABLET | Freq: Four times a day (QID) | ORAL | Status: AC | PRN

## 2011-09-07 MED ORDER — IOHEXOL 300 MG/ML  SOLN
100.0000 mL | Freq: Once | INTRAMUSCULAR | Status: AC | PRN
  Administered 2011-09-07: 100 mL via INTRAVENOUS

## 2011-09-07 MED ORDER — DIAZEPAM 5 MG PO TABS
10.0000 mg | ORAL_TABLET | Freq: Once | ORAL | Status: AC
  Administered 2011-09-07: 10 mg via ORAL
  Filled 2011-09-07: qty 2

## 2011-09-07 MED ORDER — CYCLOBENZAPRINE HCL 10 MG PO TABS: 10.0000 mg | ORAL_TABLET | Freq: Three times a day (TID) | ORAL | Status: AC | PRN

## 2011-09-07 MED ORDER — IBUPROFEN 200 MG PO TABS
ORAL_TABLET | ORAL | Status: AC
  Filled 2011-09-07: qty 3

## 2011-09-07 NOTE — ED Provider Notes (Signed)
Evaluation and management procedures were performed by the PA/NP under my supervision/collaboration.  I evaluated this patient face-to-face at the time of encounter.  Please see my note dated at that time. 8:31 AM The patient is resting comfortably in his room, and ambulatory without any difficulty or distress. He is breathing easily with clear lung sounds in all fields of his lungs, normal heart sounds but with mild tachycardia, full pulses peripherally to palpation, skin is warm and dry, no crepitus or deformity to the chest wall, no abdominal tenderness including a thorough evaluation of the subcostal abdomen by palpation evoking no tenderness at all. There is no bruising or seatbelt sign. I am not sure while the patient has tachycardia despite normal blood pressure in no apparent distress and no suggestion of injuries. His abdomen is not acute by examination he complains of no abdominal pain. I do not feel that he needs a CT scan of the abdomen to rule out intra-abdominal pathology at this time. We will obtain a chest x-ray, CBC, complete metabolic profile to survey for abnormalities prompting further evaluation, and we will give him a 1 L saline bolus to reevaluate his tachycardia. His examination and history is not suggestive of serious underlying injury.  Felisa Bonier, MD 09/07/11 [phone]
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
## 2011-09-07 NOTE — ED Notes (Signed)
Pt heart rate is still in the 120s and [name] aware and will reassess in 20 minutes or as needed

## 2011-09-14 ENCOUNTER — Other Ambulatory Visit: Payer: Self-pay | Admitting: Internal Medicine

## 2011-09-14 DIAGNOSIS — M545 Low back pain, unspecified: Secondary | ICD-10-CM

## 2011-09-15 ENCOUNTER — Ambulatory Visit
Admission: RE | Admit: 2011-09-15 | Discharge: 2011-09-15 | Disposition: A | Discharge status: Home / Self Care | Payer: BC Managed Care – PPO | Source: Ambulatory Visit | Attending: Internal Medicine | Admitting: Internal Medicine

## 2011-09-15 DIAGNOSIS — M545 Low back pain, unspecified: Secondary | ICD-10-CM

## 2011-09-15 MED ORDER — GADOBENATE DIMEGLUMINE 529 MG/ML IV SOLN
20.0000 mL | Freq: Once | INTRAVENOUS | Status: AC | PRN
  Administered 2011-09-15: 20 mL via INTRAVENOUS

## 2011-09-16 ENCOUNTER — Other Ambulatory Visit: Payer: Self-pay | Admitting: Neurosurgery

## 2011-09-16 ENCOUNTER — Ambulatory Visit
Admission: RE | Admit: 2011-09-16 | Discharge: 2011-09-16 | Disposition: A | Discharge status: Home / Self Care | Payer: BC Managed Care – PPO | Source: Ambulatory Visit | Attending: Neurosurgery | Admitting: Neurosurgery

## 2011-09-16 DIAGNOSIS — M545 Low back pain, unspecified: Secondary | ICD-10-CM

## 2011-09-16 DIAGNOSIS — M25559 Pain in unspecified hip: Secondary | ICD-10-CM

## 2011-09-16 NOTE — ED Provider Notes (Signed)
Evaluation and management procedures were performed by the PA/NP under my supervision/collaboration.    Felisa Bonier, MD 09/16/11 [phone]

## 2011-12-27 MED ORDER — ONDANSETRON HCL 4 MG/2ML IJ SOLN
INTRAMUSCULAR | Status: AC
  Filled 2011-12-27: qty 2

## 2012-01-11 ENCOUNTER — Encounter (HOSPITAL_COMMUNITY): Payer: Self-pay | Admitting: *Deleted

## 2012-01-11 ENCOUNTER — Emergency Department (HOSPITAL_COMMUNITY): Payer: BC Managed Care – PPO

## 2012-01-11 ENCOUNTER — Emergency Department (HOSPITAL_COMMUNITY)
Admission: EM | Admit: 2012-01-11 | Discharge: 2012-01-12 | Disposition: A | Discharge status: Home / Self Care | Payer: BC Managed Care – PPO | Attending: Emergency Medicine | Admitting: Emergency Medicine

## 2012-01-11 DIAGNOSIS — I2510 Atherosclerotic heart disease of native coronary artery without angina pectoris: Secondary | ICD-10-CM | POA: Insufficient documentation

## 2012-01-11 DIAGNOSIS — I10 Essential (primary) hypertension: Secondary | ICD-10-CM | POA: Insufficient documentation

## 2012-01-11 DIAGNOSIS — R079 Chest pain, unspecified: Secondary | ICD-10-CM | POA: Insufficient documentation

## 2012-01-11 DIAGNOSIS — M25519 Pain in unspecified shoulder: Secondary | ICD-10-CM | POA: Insufficient documentation

## 2012-01-11 DIAGNOSIS — M25512 Pain in left shoulder: Secondary | ICD-10-CM

## 2012-01-11 DIAGNOSIS — Z79899 Other long term (current) drug therapy: Secondary | ICD-10-CM | POA: Insufficient documentation

## 2012-01-11 DIAGNOSIS — E119 Type 2 diabetes mellitus without complications: Secondary | ICD-10-CM | POA: Insufficient documentation

## 2012-01-11 DIAGNOSIS — M542 Cervicalgia: Secondary | ICD-10-CM | POA: Insufficient documentation

## 2012-01-11 DIAGNOSIS — Z7982 Long term (current) use of aspirin: Secondary | ICD-10-CM | POA: Insufficient documentation

## 2012-01-11 DIAGNOSIS — E7800 Pure hypercholesterolemia, unspecified: Secondary | ICD-10-CM | POA: Insufficient documentation

## 2012-01-11 HISTORY — DX: Other chronic pain: G89.29

## 2012-01-11 HISTORY — DX: Dorsalgia, unspecified: M54.9

## 2012-01-11 HISTORY — DX: Atherosclerotic heart disease of native coronary artery without angina pectoris: I25.10

## 2012-01-11 HISTORY — DX: Pure hypercholesterolemia, unspecified: E78.00

## 2012-01-11 LAB — BASIC METABOLIC PANEL
Chloride: 100 mEq/L (ref 96–112)
GFR calc Af Amer: >90 mL/min (ref >90)
Potassium: 4 mEq/L (ref 3.5–5.1)

## 2012-01-11 LAB — CBC
Platelets: 205 10*3/uL (ref 150–400)
RBC: 4.5 MIL/uL (ref 4.22–5.81)
RDW: 12 % (ref 11.5–15.5)
WBC: 7.1 10*3/uL (ref 4.0–10.5)

## 2012-01-11 MED ORDER — OXYCODONE-ACETAMINOPHEN 5-325 MG PO TABS
1.0000 | ORAL_TABLET | Freq: Once | ORAL | Status: AC
  Administered 2012-01-11: 1 via ORAL
  Filled 2012-01-11: qty 1

## 2012-01-11 NOTE — ED Notes (Signed)
Pt to x-ray, ambulatory.  Ortho tech paged.
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
## 2012-01-11 NOTE — ED Notes (Signed)
Pt back from x-ray.

## 2012-01-12 ENCOUNTER — Encounter (HOSPITAL_COMMUNITY): Payer: Self-pay | Admitting: Pharmacy Technician

## 2012-01-12 NOTE — Discharge Instructions (Signed)
Please read and follow all provided instructions.  Your diagnoses today include:  1. Left shoulder pain     Tests performed today include:  X-ray of your chest and shoulder  Vital signs. See below for your results today.   Medications prescribed:  None - use home pain medication and muscle relaxer as previously prescribed.   Home care instructions:  Follow any educational materials contained in this packet.   Follow-up instructions: Please follow-up with your orthopedic doctor in the next week for further evaluation of your symptoms.  Return instructions:   Please return to the Emergency Department if you experience worsening symptoms.   Please return if you have any other emergent concerns.  Additional Information:  Your vital signs today were: BP 125/67  Pulse 87  Temp(Src) 99.1 F (37.3 C) (Oral)  Resp 16  Ht 5\' 8"  (1.727 m)  Wt 280 lb (127.007 kg)  BMI 42.57 kg/m2  SpO2 95% If your blood pressure (BP) was elevated above 135/85 this visit, please have this repeated by your doctor within one month. -------------- No Primary Care Doctor Call [facility]  [phone] Other agencies that provide inexpensive medical care    [facility]  [phone]    [facility]  [phone]    [facility]  [phone]    [facility]  [phone]    [facility]  [phone]    [facility]  [phone] -------------- RESOURCE GUIDE:  Dental Problems  Patients with Medicaid: [facility] [phone]                                            [phone] [address] Phone:  [phone]                                                   Phone:  [phone]  If unable to pay or uninsured, contact:  [facility] or [facility]. to become qualified for the adult dental clinic.  Chronic Pain Problems Contact [facility]  [phone] Patients need to be  referred by their primary care doctor.  Insufficient Money for Medicine Contact [employer]:  call "211" or [facility] [phone].  Psychological Services [facility]  [phone] [facility]  [phone] [facility]   [phone] (emergency services [phone])  Substance Abuse Resources [facility]  [phone] [facility] [phone] [facility] [phone] Floydene Flock [phone] Residential & Outpatient Substance Abuse Program  [phone]  Abuse/Neglect [facility] Child Abuse Hotline 786-600-3786 [facility] Child Abuse Hotline 908-652-5658 (After Hours)  Emergency Shelter [facility] [phone]  Maternity Homes [facility] [place] [facility] [place] [phone] [facility] 2013642358  [facility]  [facility]     [employer]                          [facility] [address] [place]  [address]      [address]  Blondell Reveal Phone:  [phone]                                   Phone:  [phone]                 Phone:  [phone]  [facility] Phone:  [phone]  [facility] Child Abuse Hotline 867-151-8639 [phone] (After Hours)

## 2012-01-12 NOTE — ED Provider Notes (Signed)
Medical screening examination/treatment/procedure(s) were performed by non-physician practitioner and as supervising physician I was immediately available for consultation/collaboration.  Juliet Rude. Rubin Payor, MD 01/12/12 [phone]

## 2012-01-13 ENCOUNTER — Other Ambulatory Visit: Payer: Self-pay | Admitting: Neurosurgery

## 2012-01-16 ENCOUNTER — Encounter (HOSPITAL_COMMUNITY): Payer: Self-pay

## 2012-01-16 ENCOUNTER — Encounter (HOSPITAL_COMMUNITY)
Admission: RE | Admit: 2012-01-16 | Discharge: 2012-01-16 | Disposition: A | Discharge status: Home / Self Care | Payer: BC Managed Care – PPO | Source: Ambulatory Visit | Attending: Neurosurgery | Admitting: Neurosurgery

## 2012-01-16 DIAGNOSIS — Z01812 Encounter for preprocedural laboratory examination: Secondary | ICD-10-CM | POA: Insufficient documentation

## 2012-01-16 HISTORY — DX: Calculus of kidney: N20.0

## 2012-01-16 HISTORY — DX: Pain in unspecified joint: M25.50

## 2012-01-16 HISTORY — DX: Peptic ulcer, site unspecified, unspecified as acute or chronic, without hemorrhage or perforation: K27.9

## 2012-01-16 HISTORY — DX: Nocturia: R35.1

## 2012-01-16 HISTORY — DX: Bronchitis, not specified as acute or chronic: J40

## 2012-01-16 HISTORY — DX: Gastro-esophageal reflux disease without esophagitis: K21.9

## 2012-01-16 LAB — TYPE AND SCREEN: ABO/RH(D): A POS

## 2012-01-16 LAB — CBC
Hemoglobin: 14.5 g/dL (ref 13.0–17.0)
MCH: 31.5 pg (ref 26.0–34.0)
MCHC: 34.9 g/dL (ref 30.0–36.0)
Platelets: 222 10*3/uL (ref 150–400)

## 2012-01-16 LAB — BASIC METABOLIC PANEL
BUN: 22 mg/dL (ref 6–23)
Calcium: 9.5 mg/dL (ref 8.4–10.5)
GFR calc non Af Amer: >90 mL/min (ref >90)
Glucose, Bld: 191 mg/dL — ABNORMAL HIGH (ref 70–99)

## 2012-01-16 LAB — SURGICAL PCR SCREEN: Staphylococcus aureus: NEGATIVE

## 2012-01-16 NOTE — Progress Notes (Addendum)
Dr. Salena Saner at [facility] is cardiologist-next appointment scheduled for 01/20/12-*request report please  Echo/Stress test done around 2004 Most recent heart cath report in epic from 2011  Medical MD is with [facility]

## 2012-01-16 NOTE — Pre-Procedure Instructions (Signed)
20 [name]  01/16/2012   Your procedure is scheduled on:  Wed, May 22 @ 0830 AM  Report to [facility] at 0630 AM.  Call this number if you have problems the morning of surgery: [phone]   Remember:   Do not eat food:After Midnight.  May have clear liquids: up to 4 Hours before arrival.(until 2:30 AM)  Clear liquids include soda, tea, black coffee, apple or grape juice, broth,water  Take these medicines the morning of surgery with A SIP OF WATER: Allopurinol,Colchicine,Pepcid,and Pain Pill(if needed)   Do not wear jewelry  Do not wear lotions, powders, or perfumes.  Do not bring valuables to the hospital.  Contacts, dentures or bridgework may not be worn into surgery.  Leave suitcase in the car. After surgery it may be brought to your room.  For patients admitted to the hospital, checkout time is 11:00 AM the day of discharge.   Special Instructions: CHG Shower Use Special Wash: 1/2 bottle night before surgery and 1/2 bottle morning of surgery.   Please read over the following fact sheets that you were given: Pain Booklet, Coughing and Deep Breathing, Blood Transfusion Information, MRSA Information and Surgical Site Infection Prevention

## 2012-01-23 NOTE — Progress Notes (Signed)
Spoke with Aram Beecham at [facility] Medical Records and she is to fax over office note and EKG from 01/20/12 visit. She stated there was no test done other than EKG.

## 2012-01-24 MED ORDER — CEFAZOLIN SODIUM-DEXTROSE 2-3 GM-% IV SOLR: 2.0000 g | INTRAVENOUS | Status: DC

## 2012-01-25 ENCOUNTER — Encounter (HOSPITAL_COMMUNITY): Admission: RE | Payer: Self-pay | Source: Ambulatory Visit

## 2012-01-25 ENCOUNTER — Ambulatory Visit (HOSPITAL_COMMUNITY): Admission: RE | Admit: 2012-01-25 | Payer: BC Managed Care – PPO | Source: Ambulatory Visit | Admitting: Neurosurgery

## 2012-01-25 SURGERY — POSTERIOR LUMBAR FUSION 1 LEVEL
Anesthesia: General | Site: Back

## 2012-03-30 ENCOUNTER — Other Ambulatory Visit: Payer: Self-pay | Admitting: Neurosurgery

## 2012-04-02 ENCOUNTER — Encounter (HOSPITAL_COMMUNITY): Payer: Self-pay | Admitting: Pharmacy Technician

## 2012-04-08 DIAGNOSIS — M2550 Pain in unspecified joint: Secondary | ICD-10-CM

## 2012-04-08 HISTORY — DX: Pain in unspecified joint: M25.50

## 2012-04-11 ENCOUNTER — Encounter (HOSPITAL_COMMUNITY): Payer: Self-pay

## 2012-04-11 ENCOUNTER — Other Ambulatory Visit: Payer: Self-pay | Admitting: Neurosurgery

## 2012-04-11 ENCOUNTER — Encounter (HOSPITAL_COMMUNITY)
Admission: RE | Admit: 2012-04-11 | Discharge: 2012-04-11 | Disposition: A | Discharge status: Home / Self Care | Payer: BC Managed Care – PPO | Source: Ambulatory Visit | Attending: Neurosurgery | Admitting: Neurosurgery

## 2012-04-11 HISTORY — DX: Gout, unspecified: M10.9

## 2012-04-11 LAB — BASIC METABOLIC PANEL
CO2: 26 mEq/L (ref 19–32)
Calcium: 9.3 mg/dL (ref 8.4–10.5)
GFR calc Af Amer: >90 mL/min (ref >90)
Sodium: 133 mEq/L — ABNORMAL LOW (ref 135–145)

## 2012-04-11 LAB — CBC
MCV: 90.8 fL (ref 78.0–100.0)
Platelets: 205 10*3/uL (ref 150–400)
RBC: 4.66 MIL/uL (ref 4.22–5.81)
WBC: 7.6 10*3/uL (ref 4.0–10.5)

## 2012-04-11 LAB — SURGICAL PCR SCREEN
MRSA, PCR: NEGATIVE
Staphylococcus aureus: NEGATIVE

## 2012-04-11 NOTE — Progress Notes (Addendum)
Contacted Dr. Erin Hearing [phone], Spoke with Asher Muir in medical records requested  EKG, ECHO, Stress test and last office visit notes.

## 2012-04-11 NOTE — Progress Notes (Signed)
Forwarded  Cardiac notes/test and anesthesia notes to [facility] for review,

## 2012-04-11 NOTE — Pre-Procedure Instructions (Signed)
20 Devon Wood  04/11/2012   Your procedure is scheduled on:  Wednesday April 18, 2012  Report to [facility] at 12:00PM.  Call this number if you have problems the morning of surgery: [phone]   Remember:   Do not eat food or drink:After Midnight.    .  Take these medicines the morning of surgery with A SIP OF WATER: allopurinol, colchicine, pepcid, norco,   Do not wear jewelry, make-up or nail polish.  Do not wear lotions, powders, or perfumes. You may wear deodorant.  Do not shave 48 hours prior to surgery. Men may shave face and neck.  Do not bring valuables to the hospital.  Contacts, dentures or bridgework may not be worn into surgery.  Leave suitcase in the car. After surgery it may be brought to your room.  For patients admitted to the hospital, checkout time is 11:00 AM the day of discharge.   Patients discharged the day of surgery will not be allowed to drive home.  Name and phone number of your driver: family / friend  Special Instructions: CHG Shower Use Special Wash: 1/2 bottle night before surgery and 1/2 bottle morning of surgery.   Please read over the following fact sheets that you were given: Pain Booklet, Coughing and Deep Breathing, MRSA Information and Surgical Site Infection Prevention

## 2012-04-11 NOTE — Progress Notes (Signed)
Contacted Dr. Lonie Peak office, spoke with Erie Noe, requested updated orders for surgery date 04/18/12.

## 2012-04-12 ENCOUNTER — Encounter (HOSPITAL_COMMUNITY): Payer: Self-pay | Admitting: Vascular Surgery

## 2012-04-12 DIAGNOSIS — M1990 Unspecified osteoarthritis, unspecified site: Secondary | ICD-10-CM

## 2012-04-12 DIAGNOSIS — K219 Gastro-esophageal reflux disease without esophagitis: Secondary | ICD-10-CM

## 2012-04-12 HISTORY — DX: Unspecified osteoarthritis, unspecified site: M19.90

## 2012-04-12 HISTORY — DX: Gastro-esophageal reflux disease without esophagitis: K21.9

## 2012-04-12 NOTE — Anesthesia Preprocedure Evaluation (Addendum)
Anesthesia Evaluation  Patient identified by MRN, date of birth, ID band Patient awake    Reviewed: Allergy & Precautions, H&P , NPO status , Patient's Chart, lab work & pertinent test results  Airway Mallampati: II TM Distance: >3 FB Neck ROM: full    Dental   Pulmonary sleep apnea , former smoker,          Cardiovascular hypertension, Pt. on medications + CAD and + Cardiac Stents Rhythm:regular Rate:Normal     Neuro/Psych negative psych ROS   GI/Hepatic PUD, GERD-  Medicated and Controlled,  Endo/Other  Well Controlled, Type 2, Oral Hypoglycemic AgentsMorbid obesity  Renal/GU Renal diseaseHx renal stones     Musculoskeletal  (+) Arthritis -, Osteoarthritis,    Abdominal   Peds  Hematology negative hematology ROS (+)   Anesthesia Other Findings   Reproductive/Obstetrics                          Anesthesia Physical Anesthesia Plan  ASA: III  Anesthesia Plan: General   Post-op Pain Management:    Induction: Intravenous  Airway Management Planned: Oral ETT  Additional Equipment:   Intra-op Plan:   Post-operative Plan: Extubation in OR  Informed Consent: I have reviewed the patients History and Physical, chart, labs and discussed the procedure including the risks, benefits and alternatives for the proposed anesthesia with the patient or authorized representative who has indicated his/her understanding and acceptance.   Dental advisory given  Plan Discussed with: Anesthesiologist and Surgeon  Anesthesia Plan Comments: (See Anesthesia note.  Shonna Chock, PA-C)       Anesthesia Quick Evaluation

## 2012-04-12 NOTE — Consult Note (Addendum)
Anesthesia chart review: Patient is a 51 year old male posted for one level lumbar fusion (orders pending) on 04/18/12 by Dr. Wynetta Emery.  History includes former smoker, hypercholesterolemia, chronic back pain, hypertension, kidney stones, peptic ulcer disease, bronchitis, GERD, diabetes mellitus type II, gout, asystole event during induction of anesthies 04/2003 (see below for details), coronary artery disease s/p LAD stent '04, prior lumbar fusion 08/2003.   According to past records, the patient was to undergo elective lumbar fusion in August 2004, but during induction of anesthesia he had a 20-30 second period of asystole.  He was on atenolol at the time.  Workup then showed normal LV function and LVH by echocardiogram and mild anterior wall ischemia by Cardiolite. He subsequently underwent cardiac catheterization showing an 80% proximal LAD lesion treated with a Cypher stent on 05/30/03.  He ultimately had the lumbar fusion on 09/01/03, but had a temporary pacing wire placed pre-operatively (see 09/01/03 anesthesia record).  His Cardiologist is Dr. Royann Shivers [facility]).  He was seen for cardiac clearance on 01/20/2012. He was still low risk from a cardiovascular standpoint. In reference to his prior history of asystole during induction of anesthesia, Dr. Royann Shivers thought this was a highly atypical presentation of coronary artery disease and did not feel that his LAD stenosis was in direct relationship with his episode of asystole. Since he was on beta blocker therapy at that time, Dr. Royann Shivers was reluctant to restart this prior to surgery. He thought his episode of asystole may be more related to a respiratory event.  His EKG on 01/20/12 [facility]) showed ST @ 105 bpm.  (He tends to run slightly tachycardic according to his last few EKGs.  His HR was documented as 124 during his PAT visit.  It was not rechecked.  If his HR is persistently > 120 upon arrival the day of surgery, then I've requested that his Short Stay nurse  repeat his EKG at that time to ensure that Mr. Villela remains in a SR.)  His last cardiac catheterization on 11/27/2009 showed normal left main, LAD stent widely patent, left circumflex was a nondominant vessel without visible obstruction, RCA large dominant vessel that generates a large PDA and bifurcating posterior lateral artery without meaningful obstruction, LV size was normal with ejection fraction of 60-65%.  CXR on 01/11/12 showed no acute cardiopulmonary findings.  Labs noted.  He will get a CBG on arrival.  He will need a T&S as well.  I have reviewed his Anesthesia history with Anesthesiologist Dr. Sampson Goon.  I requested patient's anesthesia record from 04/29/03.  Mr. Castonguay's Anesthesiologist will evaluate him and review available records on the day of surgery.  Could consider placing external cardiac pads pre-operatively.   Shonna Chock, PA-C

## 2012-04-13 DIAGNOSIS — Z87442 Personal history of urinary calculi: Secondary | ICD-10-CM

## 2012-04-13 DIAGNOSIS — E7800 Pure hypercholesterolemia, unspecified: Secondary | ICD-10-CM

## 2012-04-13 DIAGNOSIS — J40 Bronchitis, not specified as acute or chronic: Secondary | ICD-10-CM

## 2012-04-13 HISTORY — DX: Personal history of urinary calculi: Z87.442

## 2012-04-13 HISTORY — DX: Pure hypercholesterolemia, unspecified: E78.00

## 2012-04-13 HISTORY — DX: Bronchitis, not specified as acute or chronic: J40

## 2012-04-17 MED ORDER — DEXTROSE 5 % IV SOLN
3.0000 g | INTRAVENOUS | Status: AC
  Administered 2012-04-18: 2 g via INTRAVENOUS
  Filled 2012-04-17: qty 3000

## 2012-04-18 ENCOUNTER — Encounter (HOSPITAL_COMMUNITY): Payer: Self-pay | Admitting: Vascular Surgery

## 2012-04-18 ENCOUNTER — Inpatient Hospital Stay (HOSPITAL_COMMUNITY)
Admission: RE | Admit: 2012-04-18 | Discharge: 2012-04-20 | DRG: 756 | Disposition: A | Discharge status: Home / Self Care | Payer: BC Managed Care – PPO | Source: Ambulatory Visit | Attending: Neurosurgery | Admitting: Neurosurgery

## 2012-04-18 ENCOUNTER — Ambulatory Visit (HOSPITAL_COMMUNITY): Payer: BC Managed Care – PPO

## 2012-04-18 ENCOUNTER — Ambulatory Visit (HOSPITAL_COMMUNITY): Payer: BC Managed Care – PPO | Admitting: Vascular Surgery

## 2012-04-18 ENCOUNTER — Encounter (HOSPITAL_COMMUNITY): Payer: Self-pay | Admitting: *Deleted

## 2012-04-18 ENCOUNTER — Encounter (HOSPITAL_COMMUNITY)
Admission: RE | Disposition: A | Discharge status: Home / Self Care | Payer: Self-pay | Source: Ambulatory Visit | Attending: Neurosurgery

## 2012-04-18 DIAGNOSIS — Q762 Congenital spondylolisthesis: Secondary | ICD-10-CM

## 2012-04-18 DIAGNOSIS — Z7982 Long term (current) use of aspirin: Secondary | ICD-10-CM

## 2012-04-18 DIAGNOSIS — M5137 Other intervertebral disc degeneration, lumbosacral region: Principal | ICD-10-CM | POA: Diagnosis present

## 2012-04-18 DIAGNOSIS — K219 Gastro-esophageal reflux disease without esophagitis: Secondary | ICD-10-CM | POA: Diagnosis present

## 2012-04-18 DIAGNOSIS — Z8711 Personal history of peptic ulcer disease: Secondary | ICD-10-CM

## 2012-04-18 DIAGNOSIS — I10 Essential (primary) hypertension: Secondary | ICD-10-CM | POA: Diagnosis present

## 2012-04-18 DIAGNOSIS — M109 Gout, unspecified: Secondary | ICD-10-CM | POA: Diagnosis present

## 2012-04-18 DIAGNOSIS — E119 Type 2 diabetes mellitus without complications: Secondary | ICD-10-CM | POA: Diagnosis present

## 2012-04-18 DIAGNOSIS — I2510 Atherosclerotic heart disease of native coronary artery without angina pectoris: Secondary | ICD-10-CM | POA: Diagnosis present

## 2012-04-18 DIAGNOSIS — Z01812 Encounter for preprocedural laboratory examination: Secondary | ICD-10-CM

## 2012-04-18 DIAGNOSIS — E7800 Pure hypercholesterolemia, unspecified: Secondary | ICD-10-CM | POA: Diagnosis present

## 2012-04-18 DIAGNOSIS — R351 Nocturia: Secondary | ICD-10-CM

## 2012-04-18 DIAGNOSIS — Z9861 Coronary angioplasty status: Secondary | ICD-10-CM

## 2012-04-18 DIAGNOSIS — Z87891 Personal history of nicotine dependence: Secondary | ICD-10-CM

## 2012-04-18 DIAGNOSIS — M51379 Other intervertebral disc degeneration, lumbosacral region without mention of lumbar back pain or lower extremity pain: Principal | ICD-10-CM | POA: Diagnosis present

## 2012-04-18 DIAGNOSIS — Z79899 Other long term (current) drug therapy: Secondary | ICD-10-CM

## 2012-04-18 DIAGNOSIS — G8929 Other chronic pain: Secondary | ICD-10-CM | POA: Diagnosis present

## 2012-04-18 HISTORY — DX: Nocturia: R35.1

## 2012-04-18 LAB — TYPE AND SCREEN: Antibody Screen: NEGATIVE

## 2012-04-18 SURGERY — POSTERIOR LUMBAR FUSION 1 WITH HARDWARE REMOVAL
Anesthesia: General | Site: Back | Wound class: Clean

## 2012-04-18 MED ORDER — ALUM & MAG HYDROXIDE-SIMETH 200-200-20 MG/5ML PO SUSP: 30.0000 mL | Freq: Four times a day (QID) | ORAL | Status: DC | PRN

## 2012-04-18 MED ORDER — ONDANSETRON HCL 4 MG/2ML IJ SOLN: 4.0000 mg | INTRAMUSCULAR | Status: DC | PRN

## 2012-04-18 MED ORDER — SUCCINYLCHOLINE CHLORIDE 20 MG/ML IJ SOLN
INTRAMUSCULAR | Status: DC | PRN
  Administered 2012-04-18: 120 mg via INTRAVENOUS

## 2012-04-18 MED ORDER — DOCUSATE SODIUM 100 MG PO CAPS
100.0000 mg | ORAL_CAPSULE | Freq: Two times a day (BID) | ORAL | Status: DC
  Administered 2012-04-18 – 2012-04-20 (×4): 100 mg via ORAL
  Filled 2012-04-18 (×5): qty 1

## 2012-04-18 MED ORDER — BACITRACIN 50000 UNITS IM SOLR
INTRAMUSCULAR | Status: AC
  Filled 2012-04-18: qty 1

## 2012-04-18 MED ORDER — SODIUM CHLORIDE 0.9 % IV SOLN: 250.0000 mL | INTRAVENOUS | Status: DC

## 2012-04-18 MED ORDER — SODIUM CHLORIDE 0.9 % IV SOLN
INTRAVENOUS | Status: AC
Start: 2012-04-18 — End: 2012-04-19
  Filled 2012-04-18: qty 500

## 2012-04-18 MED ORDER — ONDANSETRON HCL 4 MG/2ML IJ SOLN: 4.0000 mg | Freq: Once | INTRAMUSCULAR | Status: DC | PRN

## 2012-04-18 MED ORDER — LIDOCAINE HCL (CARDIAC) 20 MG/ML IV SOLN
INTRAVENOUS | Status: DC | PRN
  Administered 2012-04-18: 80 mg via INTRAVENOUS

## 2012-04-18 MED ORDER — ALLOPURINOL 100 MG PO TABS
200.0000 mg | ORAL_TABLET | Freq: Every day | ORAL | Status: DC
  Administered 2012-04-19 – 2012-04-20 (×2): 200 mg via ORAL
  Filled 2012-04-18 (×2): qty 2

## 2012-04-18 MED ORDER — CEFAZOLIN SODIUM 1-5 GM-% IV SOLN
1.0000 g | Freq: Three times a day (TID) | INTRAVENOUS | Status: AC
  Administered 2012-04-18 – 2012-04-19 (×2): 1 g via INTRAVENOUS
  Filled 2012-04-18 (×2): qty 50

## 2012-04-18 MED ORDER — SIMVASTATIN 20 MG PO TABS
20.0000 mg | ORAL_TABLET | Freq: Every day | ORAL | Status: DC
  Administered 2012-04-18 – 2012-04-20 (×3): 20 mg via ORAL
  Filled 2012-04-18 (×3): qty 1

## 2012-04-18 MED ORDER — BUPIVACAINE HCL (PF) 0.25 % IJ SOLN
INTRAMUSCULAR | Status: DC | PRN
  Administered 2012-04-18: 10 mL

## 2012-04-18 MED ORDER — LISINOPRIL 20 MG PO TABS
20.0000 mg | ORAL_TABLET | Freq: Every day | ORAL | Status: DC
  Administered 2012-04-18 – 2012-04-20 (×3): 20 mg via ORAL
  Filled 2012-04-18 (×3): qty 1

## 2012-04-18 MED ORDER — CYCLOBENZAPRINE HCL 10 MG PO TABS
10.0000 mg | ORAL_TABLET | Freq: Three times a day (TID) | ORAL | Status: DC | PRN
  Administered 2012-04-18 – 2012-04-20 (×3): 10 mg via ORAL
  Filled 2012-04-18 (×3): qty 1

## 2012-04-18 MED ORDER — POTASSIUM CHLORIDE IN NACL 20-0.9 MEQ/L-% IV SOLN
INTRAVENOUS | Status: DC
  Administered 2012-04-18: 23:00:00 via INTRAVENOUS
  Filled 2012-04-18 (×5): qty 1000

## 2012-04-18 MED ORDER — HYDROMORPHONE HCL PF 1 MG/ML IJ SOLN: 0.2500 mg | INTRAMUSCULAR | Status: DC | PRN

## 2012-04-18 MED ORDER — HYDROMORPHONE HCL PF 1 MG/ML IJ SOLN
0.5000 mg | INTRAMUSCULAR | Status: DC | PRN
  Administered 2012-04-18 – 2012-04-20 (×11): 1 mg via INTRAVENOUS
  Filled 2012-04-18 (×11): qty 1

## 2012-04-18 MED ORDER — ACETAMINOPHEN 325 MG PO TABS
650.0000 mg | ORAL_TABLET | ORAL | Status: DC | PRN
  Filled 2012-04-18: qty 2

## 2012-04-18 MED ORDER — HEMOSTATIC AGENTS (NO CHARGE) OPTIME
TOPICAL | Status: DC | PRN
  Administered 2012-04-18: 1 via TOPICAL

## 2012-04-18 MED ORDER — 0.9 % SODIUM CHLORIDE (POUR BTL) OPTIME
TOPICAL | Status: DC | PRN
  Administered 2012-04-18: 1000 mL

## 2012-04-18 MED ORDER — SODIUM CHLORIDE 0.9 % IJ SOLN: 3.0000 mL | Freq: Two times a day (BID) | INTRAMUSCULAR | Status: DC

## 2012-04-18 MED ORDER — LIDOCAINE-EPINEPHRINE 1 %-1:100000 IJ SOLN
INTRAMUSCULAR | Status: DC | PRN
  Administered 2012-04-18: 10 mL

## 2012-04-18 MED ORDER — MENTHOL 3 MG MT LOZG: 1.0000 | LOZENGE | OROMUCOSAL | Status: DC | PRN

## 2012-04-18 MED ORDER — OXYCODONE-ACETAMINOPHEN 5-325 MG PO TABS
1.0000 | ORAL_TABLET | ORAL | Status: DC | PRN
  Administered 2012-04-18 – 2012-04-20 (×4): 2 via ORAL
  Filled 2012-04-18 (×4): qty 2

## 2012-04-18 MED ORDER — FENTANYL CITRATE 0.05 MG/ML IJ SOLN
INTRAMUSCULAR | Status: AC
  Administered 2012-04-18: 100 ug
  Filled 2012-04-18: qty 2

## 2012-04-18 MED ORDER — INDOMETHACIN 25 MG PO CAPS
25.0000 mg | ORAL_CAPSULE | Freq: Two times a day (BID) | ORAL | Status: DC
  Filled 2012-04-18 (×5): qty 1

## 2012-04-18 MED ORDER — HEPARIN SODIUM (PORCINE) 1000 UNIT/ML IJ SOLN
INTRAMUSCULAR | Status: AC
  Filled 2012-04-18: qty 1

## 2012-04-18 MED ORDER — MIDAZOLAM HCL 5 MG/5ML IJ SOLN
INTRAMUSCULAR | Status: DC | PRN
  Administered 2012-04-18: 2 mg via INTRAVENOUS

## 2012-04-18 MED ORDER — FAMOTIDINE 20 MG PO TABS
20.0000 mg | ORAL_TABLET | Freq: Every day | ORAL | Status: DC
  Administered 2012-04-19 – 2012-04-20 (×2): 20 mg via ORAL
  Filled 2012-04-18 (×3): qty 1

## 2012-04-18 MED ORDER — ONDANSETRON HCL 4 MG/2ML IJ SOLN
INTRAMUSCULAR | Status: DC | PRN
  Administered 2012-04-18: 4 mg via INTRAVENOUS

## 2012-04-18 MED ORDER — COLCHICINE 0.6 MG PO TABS
0.6000 mg | ORAL_TABLET | Freq: Every day | ORAL | Status: DC
  Filled 2012-04-18 (×4): qty 1

## 2012-04-18 MED ORDER — GLIPIZIDE 10 MG PO TABS
10.0000 mg | ORAL_TABLET | Freq: Two times a day (BID) | ORAL | Status: DC
  Administered 2012-04-19 – 2012-04-20 (×3): 10 mg via ORAL
  Filled 2012-04-18 (×5): qty 1

## 2012-04-18 MED ORDER — LIDOCAINE HCL 4 % MT SOLN
OROMUCOSAL | Status: DC | PRN
  Administered 2012-04-18: 4 mL via TOPICAL

## 2012-04-18 MED ORDER — PHENOL 1.4 % MT LIQD: 1.0000 | OROMUCOSAL | Status: DC | PRN

## 2012-04-18 MED ORDER — FENTANYL CITRATE 0.05 MG/ML IJ SOLN
INTRAMUSCULAR | Status: DC | PRN
  Administered 2012-04-18 (×2): 100 ug via INTRAVENOUS
  Administered 2012-04-18 (×2): 50 ug via INTRAVENOUS
  Administered 2012-04-18 (×4): 100 ug via INTRAVENOUS
  Administered 2012-04-18: 150 ug via INTRAVENOUS
  Administered 2012-04-18: 100 ug via INTRAVENOUS

## 2012-04-18 MED ORDER — ACETAMINOPHEN 650 MG RE SUPP: 650.0000 mg | RECTAL | Status: DC | PRN

## 2012-04-18 MED ORDER — HYDROMORPHONE HCL PF 1 MG/ML IJ SOLN
0.2500 mg | INTRAMUSCULAR | Status: DC | PRN
  Administered 2012-04-18 (×2): 0.5 mg via INTRAVENOUS

## 2012-04-18 MED ORDER — SODIUM CHLORIDE 0.9 % IJ SOLN: 3.0000 mL | INTRAMUSCULAR | Status: DC | PRN

## 2012-04-18 MED ORDER — SODIUM CHLORIDE 0.9 % IR SOLN
Status: DC | PRN
  Administered 2012-04-18: 16:00:00

## 2012-04-18 MED ORDER — THROMBIN 20000 UNITS EX KIT
PACK | CUTANEOUS | Status: DC | PRN
  Administered 2012-04-18: 20000 [IU] via TOPICAL

## 2012-04-18 MED ORDER — METFORMIN HCL 500 MG PO TABS
1000.0000 mg | ORAL_TABLET | Freq: Two times a day (BID) | ORAL | Status: DC
  Administered 2012-04-19 – 2012-04-20 (×3): 1000 mg via ORAL
  Filled 2012-04-18 (×5): qty 2

## 2012-04-18 MED ORDER — PROPOFOL 10 MG/ML IV EMUL
INTRAVENOUS | Status: DC | PRN
  Administered 2012-04-18: 300 mg via INTRAVENOUS

## 2012-04-18 MED ORDER — DEXAMETHASONE SODIUM PHOSPHATE 10 MG/ML IJ SOLN
INTRAMUSCULAR | Status: DC | PRN
  Administered 2012-04-18: 12 mg via INTRAVENOUS

## 2012-04-18 MED ORDER — NEOSTIGMINE METHYLSULFATE 1 MG/ML IJ SOLN
INTRAMUSCULAR | Status: DC | PRN
  Administered 2012-04-18: 4 mg via INTRAVENOUS

## 2012-04-18 MED ORDER — LACTATED RINGERS IV SOLN
INTRAVENOUS | Status: DC | PRN
  Administered 2012-04-18 (×4): via INTRAVENOUS

## 2012-04-18 MED ORDER — VECURONIUM BROMIDE 10 MG IV SOLR
INTRAVENOUS | Status: DC | PRN
  Administered 2012-04-18 (×2): 4 mg via INTRAVENOUS
  Administered 2012-04-18: 5 mg via INTRAVENOUS
  Administered 2012-04-18: 4 mg via INTRAVENOUS
  Administered 2012-04-18: 5 mg via INTRAVENOUS

## 2012-04-18 MED ORDER — HYDROMORPHONE HCL PF 1 MG/ML IJ SOLN
INTRAMUSCULAR | Status: AC
  Administered 2012-04-18: 0.5 mg via INTRAVENOUS
  Filled 2012-04-18: qty 1

## 2012-04-18 MED ORDER — NAPROXEN 375 MG PO TABS
375.0000 mg | ORAL_TABLET | Freq: Two times a day (BID) | ORAL | Status: DC
  Administered 2012-04-19 – 2012-04-20 (×3): 375 mg via ORAL
  Filled 2012-04-18 (×5): qty 1

## 2012-04-18 MED ORDER — ASPIRIN 81 MG PO TABS: 81.0000 mg | ORAL_TABLET | Freq: Every day | ORAL | Status: DC

## 2012-04-18 MED ORDER — HYDROCODONE-ACETAMINOPHEN 7.5-325 MG PO TABS: 1.0000 | ORAL_TABLET | ORAL | Status: DC | PRN

## 2012-04-18 MED ORDER — ASPIRIN EC 81 MG PO TBEC
81.0000 mg | DELAYED_RELEASE_TABLET | Freq: Every day | ORAL | Status: DC
Start: 2012-04-18 — End: 2012-04-20
  Administered 2012-04-18 – 2012-04-20 (×3): 81 mg via ORAL
  Filled 2012-04-18 (×3): qty 1

## 2012-04-18 MED ORDER — GLYCOPYRROLATE 0.2 MG/ML IJ SOLN
INTRAMUSCULAR | Status: DC | PRN
  Administered 2012-04-18: 0.6 mg via INTRAVENOUS

## 2012-04-18 SURGICAL SUPPLY — 70 items
ADH SKN CLS APL DERMABOND .7 (GAUZE/BANDAGES/DRESSINGS) ×1
APL SKNCLS STERI-STRIP NONHPOA (GAUZE/BANDAGES/DRESSINGS) ×1
BAG DECANTER FOR FLEXI CONT (MISCELLANEOUS) ×2 IMPLANT
BENZOIN TINCTURE PRP APPL 2/3 (GAUZE/BANDAGES/DRESSINGS) ×2 IMPLANT
BLADE SURG 11 STRL SS (BLADE) ×2 IMPLANT
BLADE SURG ROTATE 9660 (MISCELLANEOUS) IMPLANT
BRUSH SCRUB EZ PLAIN DRY (MISCELLANEOUS) ×2 IMPLANT
BUR MATCHSTICK NEURO 3.0 LAGG (BURR) ×2 IMPLANT
BUR PRECISION FLUTE 6.0 (BURR) ×2 IMPLANT
CANISTER SUCTION 2500CC (MISCELLANEOUS) ×2 IMPLANT
CLOTH BEACON ORANGE TIMEOUT ST (SAFETY) ×2 IMPLANT
CONT SPEC 4OZ CLIKSEAL STRL BL (MISCELLANEOUS) ×4 IMPLANT
COVER BACK TABLE 24X17X13 BIG (DRAPES) IMPLANT
COVER TABLE BACK 60X90 (DRAPES) ×2 IMPLANT
DECANTER SPIKE VIAL GLASS SM (MISCELLANEOUS) ×1 IMPLANT
DERMABOND ADVANCED (GAUZE/BANDAGES/DRESSINGS) ×1
DERMABOND ADVANCED .7 DNX12 (GAUZE/BANDAGES/DRESSINGS) ×1 IMPLANT
DRAPE C-ARM 42X72 X-RAY (DRAPES) ×4 IMPLANT
DRAPE LAPAROTOMY 100X72X124 (DRAPES) ×2 IMPLANT
DRAPE POUCH INSTRU U-SHP 10X18 (DRAPES) ×2 IMPLANT
DRAPE PROXIMA HALF (DRAPES) IMPLANT
DRAPE SURG 17X23 STRL (DRAPES) ×2 IMPLANT
DRSG OPSITE 4X5.5 SM (GAUZE/BANDAGES/DRESSINGS) ×4 IMPLANT
ELECT REM PT RETURN 9FT ADLT (ELECTROSURGICAL) ×2
ELECTRODE REM PT RTRN 9FT ADLT (ELECTROSURGICAL) ×1 IMPLANT
EVACUATOR 3/16  PVC DRAIN (DRAIN) ×1
EVACUATOR 3/16 PVC DRAIN (DRAIN) ×1 IMPLANT
GAUZE SPONGE 4X4 16PLY XRAY LF (GAUZE/BANDAGES/DRESSINGS) ×1 IMPLANT
GLOVE BIO SURGEON STRL SZ8 (GLOVE) ×4 IMPLANT
GLOVE BIOGEL PI IND STRL 7.5 (GLOVE) IMPLANT
GLOVE BIOGEL PI IND STRL 8 (GLOVE) IMPLANT
GLOVE BIOGEL PI INDICATOR 7.5 (GLOVE) ×2
GLOVE BIOGEL PI INDICATOR 8 (GLOVE) ×1
GLOVE ECLIPSE 7.0 STRL STRAW (GLOVE) ×2 IMPLANT
GLOVE ECLIPSE 7.5 STRL STRAW (GLOVE) ×7 IMPLANT
GLOVE EXAM NITRILE LRG STRL (GLOVE) ×1 IMPLANT
GLOVE EXAM NITRILE MD LF STRL (GLOVE) IMPLANT
GLOVE EXAM NITRILE XL STR (GLOVE) IMPLANT
GLOVE EXAM NITRILE XS STR PU (GLOVE) IMPLANT
GLOVE INDICATOR 8.5 STRL (GLOVE) ×4 IMPLANT
GOWN BRE IMP SLV AUR LG STRL (GOWN DISPOSABLE) ×4 IMPLANT
GOWN BRE IMP SLV AUR XL STRL (GOWN DISPOSABLE) ×4 IMPLANT
GOWN STRL REIN 2XL LVL4 (GOWN DISPOSABLE) IMPLANT
KIT BASIN OR (CUSTOM PROCEDURE TRAY) ×2 IMPLANT
KIT ROOM TURNOVER OR (KITS) ×2 IMPLANT
MILL MEDIUM DISP (BLADE) ×1 IMPLANT
NDL HYPO 25X1 1.5 SAFETY (NEEDLE) ×1 IMPLANT
NEEDLE HYPO 25X1 1.5 SAFETY (NEEDLE) ×2 IMPLANT
NS IRRIG 1000ML POUR BTL (IV SOLUTION) ×2 IMPLANT
PACK LAMINECTOMY NEURO (CUSTOM PROCEDURE TRAY) ×2 IMPLANT
PAD ARMBOARD 7.5X6 YLW CONV (MISCELLANEOUS) ×6 IMPLANT
PUTTY BONE DBX 5CC MIX (Putty) ×1 IMPLANT
PUTTY DBX 5CC (Putty) IMPLANT
ROD DANEK 40MM (Rod) ×2 IMPLANT
SCREW PEDICLE VA L635 6.5X50M (Screw) ×2 IMPLANT
SCREW SET NON BREAK OFF (Screw) ×4 IMPLANT
SPACER CALIBER 10X22MM 11-15MM (Spacer) ×2 IMPLANT
SPONGE GAUZE 4X4 12PLY (GAUZE/BANDAGES/DRESSINGS) ×2 IMPLANT
SPONGE LAP 4X18 X RAY DECT (DISPOSABLE) IMPLANT
SPONGE SURGIFOAM ABS GEL 100 (HEMOSTASIS) ×2 IMPLANT
STRIP CLOSURE SKIN 1/2X4 (GAUZE/BANDAGES/DRESSINGS) ×3 IMPLANT
SUT VIC AB 0 CT1 18XCR BRD8 (SUTURE) ×2 IMPLANT
SUT VIC AB 0 CT1 8-18 (SUTURE) ×4
SUT VIC AB 2-0 CT1 18 (SUTURE) ×2 IMPLANT
SUT VICRYL 4-0 PS2 18IN ABS (SUTURE) ×2 IMPLANT
SYR 20ML ECCENTRIC (SYRINGE) ×2 IMPLANT
TOWEL OR 17X24 6PK STRL BLUE (TOWEL DISPOSABLE) ×2 IMPLANT
TOWEL OR 17X26 10 PK STRL BLUE (TOWEL DISPOSABLE) ×2 IMPLANT
TRAY FOLEY CATH 14FRSI W/METER (CATHETERS) ×2 IMPLANT
WATER STERILE IRR 1000ML POUR (IV SOLUTION) ×2 IMPLANT

## 2012-04-18 NOTE — OR Nursing (Signed)
Explanted hardware placed in specimen cup, labeled with patient information and sent to sterile processing for decontamination per Dr. Lonie Peak order.

## 2012-04-18 NOTE — Anesthesia Postprocedure Evaluation (Signed)
Anesthesia Post Note  Patient: Devon Wood  Procedure(s) Performed: Procedure(s) (LRB): POSTERIOR LUMBAR FUSION 1 WITH HARDWARE REMOVAL (N/A)  Anesthesia type: general  Patient location: PACU  Post pain: Pain level controlled  Post assessment: Patient's Cardiovascular Status Stable  Last Vitals:  Filed Vitals:   04/18/12 2039  BP: 163/80  Pulse: 106  Temp: 37 C  Resp: 18    Post vital signs: Reviewed and stable  Level of consciousness: sedated  Complications: No apparent anesthesia complications

## 2012-04-18 NOTE — H&P (Signed)
Devon Wood is an 51 y.o. male.   Chief Complaint: Back and bilateral leg pain HPI: Devon Wood very pleasant 51 year old gentleman is a long-standing problems with his low back underwent L5-S1 fusion and did very well initially however the last several months of progressive worsening back and bilateral leg pain rating down the back and outside of his legs times a sequences with an L5 nerve root pattern as well as pain would radiate into his shin consistent with L4. Patient verified that all forms of conservative and inflammatories physical therapy and injections MRI scan and subsequent imaging revealed instability and spinal listhesis L4-5 with progressive spinal stenosis at this level. And the patient takes her treatment imaging findings and clinical exam is recommended a decompressive position procedure L4-5 expiration of fusion Hardware L5-S1. Wrist nevus of the operation as well as parrot course expectations of outcome alternatives of surgery were all spine the patient and she agrees to proceed forward.  Past Medical History  Diagnosis Date  . Hypercholesteremia     takes Simvastatin daily  . Chronic back pain     L3 buldging disc;DDD  . Peptic ulcer   . Hypertension     takes Lisinopril daily  . Bronchitis     hx of;last time couple of yrs ago  . Joint pain   . GERD (gastroesophageal reflux disease)     takes Pepcid daily  . Kidney stones     "active ones at present"on the left side  . Nocturia   . Diabetes mellitus     takes Glipizide and Metformin daily  . Bronchitis     hx of  . Gout     hx of  . Coronary artery disease 2004    stent placement, sees Dr "[facility]" [facility]  . Complication of anesthesia     20-30 second asystole episode during induction  04/2003,  was on b-blocker tx at the time, found to have 80% LAD lesion s/p stent (but unclear if CAD was the direct cause)     Past Surgical History  Procedure Date  . Coronary stent placement   . Lumbar fusion  2004  . Hand surgery 1980    right  . Tonsillectomy 1969  . Coronary angioplasty 12/2009    1stent  . Esophagogastroduodenoscopy in the 90's  . Lumbar disc surgery 2002    L5    Family History  Problem Relation Age of Onset  . Anesthesia problems Neg Hx   . Hypotension Neg Hx   . Malignant hyperthermia Neg Hx   . Pseudochol deficiency Neg Hx    Social History:  reports that he has quit smoking. He does not have any smokeless tobacco history on file. He reports that he drinks alcohol. He reports that he does not use illicit drugs.  Allergies:  Allergies  Allergen Reactions  . Erythromycin Rash    Medications Prior to Admission  Medication Sig Dispense Refill  . allopurinol (ZYLOPRIM) 100 MG tablet Take 200 mg by mouth daily.        Marland Kitchen aspirin 81 MG tablet Take 81 mg by mouth daily.      . colchicine 0.6 MG tablet Take 0.6 mg by mouth daily as needed. For gout flares       . famotidine (PEPCID) 20 MG tablet Take 20 mg by mouth daily.       Marland Kitchen glipiZIDE (GLUCOTROL) 10 MG tablet Take 10 mg by mouth 2 (two) times daily before a meal.        .  HYDROcodone-acetaminophen (NORCO) 7.5-325 MG per tablet Take 1 tablet by mouth every 6 (six) hours as needed. For pain      . indomethacin (INDOCIN) 25 MG capsule Take 25 mg by mouth 2 (two) times daily with a meal. For gout flares       . lisinopril (PRINIVIL,ZESTRIL) 20 MG tablet Take 20 mg by mouth daily.        . metFORMIN (GLUCOPHAGE) 1000 MG tablet Take 1,000 mg by mouth 2 (two) times daily with a meal.        . naproxen (NAPROSYN) 375 MG tablet Take 375 mg by mouth 2 (two) times daily with a meal.      . simvastatin (ZOCOR) 40 MG tablet Take 20 mg by mouth daily.          Results for orders placed during the hospital encounter of 04/18/12 (from the past 48 hour(s))  GLUCOSE, CAPILLARY     Status: Abnormal   Collection Time   04/18/12 12:08 PM      Component Value Range Comment   Glucose-Capillary 179 (*) 70 - 99 mg/dL   TYPE AND  SCREEN     Status: Normal   Collection Time   04/18/12 12:34 PM      Component Value Range Comment   ABO/RH(D) A POS      Antibody Screen NEG      Sample Expiration 04/21/2012      No results found.  Review of Systems  Constitutional: Negative.   HENT: Negative.   Eyes: Negative.   Respiratory: Negative.   Cardiovascular: Negative.   Gastrointestinal: Negative.   Genitourinary: Negative.   Musculoskeletal: Positive for myalgias and back pain.  Skin: Negative.   Neurological: Positive for tremors and sensory change.  Endo/Heme/Allergies: Negative.   Psychiatric/Behavioral: Negative.     Blood pressure 150/97, pulse 109, temperature 99.4 F (37.4 C), temperature source Oral, resp. rate 20, SpO2 97.00%. Physical Exam  Constitutional: He is oriented to person, place, and time. He appears well-developed and well-nourished.  HENT:  Head: Normocephalic.  Eyes: Pupils are equal, round, and reactive to light.  Neck: Normal range of motion.  Cardiovascular: Normal rate.   Respiratory: Breath sounds normal.  GI: Soft.  Musculoskeletal: Normal range of motion.  Neurological: He is alert and oriented to person, place, and time. GCS eye subscore is 4. GCS verbal subscore is 5. GCS motor subscore is 6.  Reflex Scores:      Patellar reflexes are 0 on the right side and 0 on the left side.      Achilles reflexes are 0 on the right side and 0 on the left side.      Strength is 5 out of 5 in his iliopsoas, quads, hamstrings, gastric, into tibialis, and EHL.     Assessment/Plan 4 damage are present for an L4-5 posterior lumbar interbody fusion.  Gal Feldhaus P 04/18/2012, 3:23 PM

## 2012-04-18 NOTE — Transfer of Care (Signed)
Immediate Anesthesia Transfer of Care Note  Patient: Devon Wood  Procedure(s) Performed: Procedure(s) (LRB): POSTERIOR LUMBAR FUSION 1 WITH HARDWARE REMOVAL (N/A)  Patient Location: PACU  Anesthesia Type: General  Level of Consciousness: awake, alert , oriented and patient cooperative  Airway & Oxygen Therapy: Patient Spontanous Breathing and Patient connected to nasal cannula oxygen  Post-op Assessment: Report given to PACU RN, Post -op Vital signs reviewed and stable and Patient moving all extremities  Post vital signs: Reviewed and stable  Complications: No apparent anesthesia complications

## 2012-04-18 NOTE — Preoperative (Signed)
Beta Blockers   Reason not to administer Beta Blockers:Not Applicable 
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
## 2012-04-19 LAB — GLUCOSE, CAPILLARY
Glucose-Capillary: 281 mg/dL — ABNORMAL HIGH (ref 70–99)
Glucose-Capillary: 277 mg/dL — ABNORMAL HIGH (ref 70–99)
Glucose-Capillary: 214 mg/dL — ABNORMAL HIGH (ref 70–99)
Glucose-Capillary: 248 mg/dL — ABNORMAL HIGH (ref 70–99)

## 2012-04-19 NOTE — Progress Notes (Signed)
OT eval completed.  Pt. Demonstrates good understanding of back precautions, and is independent with BADLs.  Back handout provided.  No further OT needed.   Full eval write up to follow.  Jeani Hawking, OTR/L [phone]

## 2012-04-19 NOTE — Evaluation (Signed)
Physical Therapy Evaluation Patient Details Name: NOLAN TUAZON MRN: [id number] DOB: Jan 26, 1961 Today's Date: 04/19/2012 Time: 4098-1191 PT Time Calculation (min): 11 min  PT Assessment / Plan / Recommendation Clinical Impression  Pt s/p PLF L4-5. Pt with previous back surgeries and is aware of and maintains back precautions. Pt is near baseline functional level. All education complete, no acute PT needs. WIll not follow    PT Assessment  Patent does not need any further PT services    Follow Up Recommendations  No PT follow up       Equipment Recommendations  None recommended by PT          Precautions / Restrictions Precautions Precautions: Back Precaution Booklet Issued: Yes (comment) Precaution Comments: handout provided Required Braces or Orthoses: Spinal Brace Spinal Brace: Lumbar corset Restrictions Weight Bearing Restrictions: No   Pertinent Vitals/Pain Pain 5/10(where drain was pulled)      Mobility  Bed Mobility Bed Mobility: Not assessed Transfers Transfers: Sit to Stand;Stand to Sit Sit to Stand: 7: Independent;With upper extremity assist;From chair/3-in-1 Stand to Sit: 7: Independent;With upper extremity assist;To chair/3-in-1 Ambulation/Gait Ambulation/Gait Assistance: 7: Independent Ambulation Distance (Feet): 800 Feet Assistive device: None Ambulation/Gait Assistance Details: No difficulties or physical assist needed. Stairs: No (not applicable)      Visit Information  Last PT Received On: 04/19/12 Assistance Needed: +1    Subjective Data  Patient Stated Goal: to go home   Prior Functioning  Home Living Lives With: Spouse Available Help at Discharge: Family;Available 24 hours/day Type of Home: House Home Access: Ramped entrance Home Layout: One level Bathroom Shower/Tub: Tub/shower unit;Curtain Firefighter: Handicapped height Bathroom Accessibility: Yes How Accessible: Accessible via walker Home Adaptive Equipment: Grab bars in  shower Prior Function Level of Independence: Independent Able to Take Stairs?: Yes Driving: Yes Vocation: Full time employment Comments: custodian for [employer] Communication: No difficulties Dominant Hand: Right    Cognition  Overall Cognitive Status: Appears within functional limits for tasks assessed/performed Arousal/Alertness: Awake/alert Orientation Level: Appears intact for tasks assessed Behavior During Session: [facility] for tasks performed    Extremity/Trunk Assessment Right Lower Extremity Assessment RLE ROM/Strength/Tone: Within functional levels RLE Sensation: WFL - Light Touch RLE Coordination: WFL - gross/fine motor Left Lower Extremity Assessment LLE ROM/Strength/Tone: Within functional levels LLE Sensation: WFL - Light Touch LLE Coordination: WFL - gross/fine motor   Balance Balance Balance Assessed: Yes Dynamic Standing Balance Dynamic Standing - Balance Support: No upper extremity supported;During functional activity Dynamic Standing - Level of Assistance: 7: Independent  End of Session PT - End of Session Equipment Utilized During Treatment: Back brace Activity Tolerance: Patient tolerated treatment well Patient left: in chair;with call bell/phone within reach Nurse Communication: Mobility status   Milana Kidney 04/19/2012, 12:08 PM  04/19/2012 Milana Kidney DPT PAGER: [phone] OFFICE: [phone]

## 2012-04-19 NOTE — Progress Notes (Signed)
Subjective: Patient reports Feeling good no leg pain incisional soreness he strained last night limits of the bed the lumbar burning in his back  Objective: Vital signs in last 24 hours: Temp:  [98.6 F (37 C)-99.9 F (37.7 C)] 98.7 F (37.1 C) (08/15 0603) Pulse Rate:  [97-124] 116  (08/15 0603) Resp:  [14-21] 20  (08/15 0603) BP: (124-172)/(71-99) 124/71 mmHg (08/15 0603) SpO2:  [95 %-100 %] 97 % (08/15 0603) Weight:  [134.355 kg (296 lb 3.2 oz)] 134.355 kg (296 lb 3.2 oz) (08/15 0421)  Intake/Output from previous day: 08/14 0701 - 08/15 0700 In: 4353.8 [P.O.:440; I.V.:3863.8; IV Piggyback:50] Out: 3020 [Urine:2400; Drains:270; Blood:350] Intake/Output this shift:    Strength out of 5 wound clean and dry  Lab Results: No results found for this basename: WBC:2,HGB:2,HCT:2,PLT:2 in the last 72 hours BMET No results found for this basename: NA:2,K:2,CL:2,CO2:2,GLUCOSE:2,BUN:2,CREATININE:2,CALCIUM:2 in the last 72 hours  Studies/Results: Dg Lumbar Spine 2-3 Views  04/18/2012  *RADIOLOGY REPORT*  Clinical data:  Anterolisthesis  LUMBAR SPINE TWO VIEW  Findings:  Two spot intraoperative images from C-arm fluoroscopy document bilateral pedicle screw placement L4-L5 with graft markers in the interspace.  IMPRESSION: 1.  P L I F L4-L5.  Original Report Authenticated By: Osa Craver, M.D.   Dg C-arm 1-60 Min  04/18/2012  *RADIOLOGY REPORT*  Clinical data:  Anterolisthesis  LUMBAR SPINE TWO VIEW  Findings:  Two spot intraoperative images from C-arm fluoroscopy document bilateral pedicle screw placement L4-L5 with graft markers in the interspace.  IMPRESSION: 1.  P L I F L4-L5.  Original Report Authenticated By: Osa Craver, M.D.    Assessment/Plan: Progress mobilization with physical therapy  LOS: 1 day     Devon Wood P 04/19/2012, 9:24 AM

## 2012-04-19 NOTE — Evaluation (Signed)
Occupational Therapy Evaluation Patient Details Name: Devon Wood MRN: [id number] DOB: 1961/07/19 Today's Date: 04/19/2012 Time: 1051-     OT Assessment / Plan / Recommendation Clinical Impression  This 51 y.o. male admitted for lumbar fusion.  Pt. demonstrates good understanding of back precautions and is modified independent with LB ADLs    OT Assessment  Patient does not need any further OT services    Follow Up Recommendations  No OT follow up    Barriers to Discharge      Equipment Recommendations  None recommended by PT;None recommended by OT    Recommendations for Other Services    Frequency       Precautions / Restrictions Precautions Precautions: Back Precaution Booklet Issued: Yes (comment) Precaution Comments: handout provided Required Braces or Orthoses: Spinal Brace Spinal Brace: Lumbar corset Restrictions Weight Bearing Restrictions: No       ADL  Eating/Feeding: Simulated;Independent Where Assessed - Eating/Feeding: Chair Grooming: Simulated;Independent Where Assessed - Grooming: Unsupported standing Upper Body Bathing: Simulated;Set up Where Assessed - Upper Body Bathing: Unsupported sit to stand Lower Body Bathing: Simulated;Modified independent Where Assessed - Lower Body Bathing: Supported standing (simulated shower in standing) Upper Body Dressing: Simulated;Independent Where Assessed - Upper Body Dressing: Unsupported sitting Lower Body Dressing: Simulated;Performed;Modified independent Where Assessed - Lower Body Dressing: Unsupported sit to stand Toilet Transfer: Simulated;Modified independent Toilet Transfer Method: Sit to Barista: Comfort height toilet;Grab bars Toileting - Clothing Manipulation and Hygiene: Simulated;Modified independent Where Assessed - Toileting Clothing Manipulation and Hygiene: Standing Tub/Shower Transfer: Simulated;Modified independent Tub/Shower Transfer Method: Engineer, materials: Grab bars Equipment Used: Back brace Transfers/Ambulation Related to ADLs: Pt. ambulating independently ADL Comments: Pt. reports he has undergone 3 months of therapy.  He is able to verbalized and demonstrate independence with back precautions.  He is independent with donning/doffing brace, and able to demonstrate proper techniques for LB ADLs    OT Diagnosis:    OT Problem List:   OT Treatment Interventions:     OT Goals    Visit Information  Last OT Received On: 04/19/12 Assistance Needed: +1    Subjective Data  Patient Stated Goal: "I just want to get home"   Prior Functioning  Vision/Perception  Home Living Lives With: Spouse Available Help at Discharge: Family;Available 24 hours/day Type of Home: House Home Access: Ramped entrance Home Layout: One level Bathroom Shower/Tub: Tub/shower unit;Curtain Firefighter: Handicapped height Bathroom Accessibility: Yes How Accessible: Accessible via walker Home Adaptive Equipment: Grab bars in shower Prior Function Level of Independence: Independent Able to Take Stairs?: Yes Driving: Yes Vocation: Full time employment Comments: custodian for [employer] Communication: No difficulties Dominant Hand: Right   Perception Perception: Within Functional Limits Praxis Praxis: Intact  Cognition  Overall Cognitive Status: Appears within functional limits for tasks assessed/performed Arousal/Alertness: Awake/alert Orientation Level: Appears intact for tasks assessed Behavior During Session: [name] for tasks performed    Extremity/Trunk Assessment Right Upper Extremity Assessment RUE ROM/Strength/Tone: Within functional levels RUE Coordination: WFL - gross/fine motor Left Upper Extremity Assessment LUE ROM/Strength/Tone: Within functional levels LUE Coordination: WFL - gross/fine motor Right Lower Extremity Assessment RLE ROM/Strength/Tone: Within functional levels RLE  Sensation: WFL - Light Touch RLE Coordination: WFL - gross/fine motor Left Lower Extremity Assessment LLE ROM/Strength/Tone: Within functional levels LLE Sensation: WFL - Light Touch LLE Coordination: WFL - gross/fine motor   Mobility Bed Mobility Bed Mobility: Not assessed Rolling Left: 6: Modified independent (Device/Increase time) Left Sidelying to Sit:  HOB flat;6: Modified independent (Device/Increase time) Sitting - Scoot to Edge of Bed: 6: Modified independent (Device/Increase time) Sit to Sidelying Left: 6: Modified independent (Device/Increase time);HOB flat Transfers Transfers: Sit to Stand;Stand to Sit Sit to Stand: 7: Independent;With upper extremity assist;From chair/3-in-1 Stand to Sit: 7: Independent;With upper extremity assist;To chair/3-in-1 Details for Transfer Assistance: Pt. demonstrates good technique and good understanding of back precautions   Exercise    Balance Balance Balance Assessed: Yes Dynamic Standing Balance Dynamic Standing - Balance Support: No upper extremity supported;During functional activity Dynamic Standing - Level of Assistance: 7: Independent  End of Session OT - End of Session Equipment Utilized During Treatment: Back brace Activity Tolerance: Patient tolerated treatment well Patient left:  (ambulating in hallway) Nurse Communication: Mobility status  GO     Devon Wood, Devon Wood 04/19/2012, 12:54 PM

## 2012-04-19 NOTE — Progress Notes (Signed)
Patient's hemovac drain no longer staying to suction and dressing saturated with blood. Dr. Wynetta Emery notified. Verbal orders to change dressing and remove hemovac. Hemovac removed, dressing changed. Will continue to monitor.

## 2012-04-19 NOTE — Progress Notes (Signed)
UR COMPLETED  

## 2012-04-19 NOTE — Progress Notes (Signed)
Inpatient Diabetes Program Recommendations  AACE/ADA: New Consensus Statement on Inpatient Glycemic Control (2013)  Target Ranges:  Prepandial:   less than 140 mg/dL      Peak postprandial:   less than 180 mg/dL (1-2 hours)      Critically ill patients:  140 - 180 mg/dL   Reason for Visit: CBGs greater than 180 mg/dl  Inpatient Diabetes Program Recommendations Correction (SSI): Add Novolog MODERATE correction scale AC & HS while in hospital.   Note:

## 2012-04-20 LAB — GLUCOSE, CAPILLARY
Glucose-Capillary: 230 mg/dL — ABNORMAL HIGH (ref 70–99)
Glucose-Capillary: 244 mg/dL — ABNORMAL HIGH (ref 70–99)

## 2012-04-20 MED ORDER — HYDROCODONE-ACETAMINOPHEN 10-325 MG PO TABS: 2.0000 | ORAL_TABLET | ORAL | Status: AC | PRN

## 2012-04-20 MED FILL — Sodium Chloride IV Soln 0.9%: INTRAVENOUS | Qty: 1000 | Status: AC

## 2012-04-20 MED FILL — Sodium Chloride Irrigation Soln 0.9%: Qty: 3000 | Status: AC

## 2012-04-20 MED FILL — Heparin Sodium (Porcine) Inj 1000 Unit/ML: INTRAMUSCULAR | Qty: 30 | Status: AC

## 2012-04-20 NOTE — Discharge Summary (Signed)
  Physician Discharge Summary  Patient ID: Devon Wood MRN: [id number] DOB/AGE: 1961-02-03 51 y.o.  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 *    Discharged Condition: good  Hospital Course: Patient is admitted hospital underwent an L4-5 decompressive position procedure with expiration of fusion removal of hardware L5-S1 postop patient did very well recovered in the floor on the floor was convalescing well and living and voiding spontaneously by postoperative was still be discharged home scheduled followup approximately 1-2 weeks. Will be discharged on Norco 10 for pain.  Consults: Significant Diagnostic Studies: Treatments: L4-5 posterior lumbar interbody fusion expiration of fusion removal of hardware L5-S1 Discharge Exam: Blood pressure 132/77, pulse 119, temperature 99.6 F (37.6 C), temperature source Oral, resp. rate 22, height 5\' 8"  (1.727 m), weight 134.355 kg (296 lb 3.2 oz), SpO2 98.00%. Strength out of 5 wound clean and dry  Disposition: Home   Medication List  As of 04/20/2012  8:00 AM   TAKE these medications         allopurinol 100 MG tablet   Commonly known as: ZYLOPRIM   Take 200 mg by mouth daily.      aspirin 81 MG tablet   Take 81 mg by mouth daily.      colchicine 0.6 MG tablet   Take 0.6 mg by mouth daily as needed. For gout flares      famotidine 20 MG tablet   Commonly known as: PEPCID   Take 20 mg by mouth daily.      glipiZIDE 10 MG tablet   Commonly known as: GLUCOTROL   Take 10 mg by mouth 2 (two) times daily before a meal.      HYDROcodone-acetaminophen 7.5-325 MG per tablet   Commonly known as: NORCO   Take 1 tablet by mouth every 6 (six) hours as needed. For pain      HYDROcodone-acetaminophen 10-325 MG per tablet   Commonly known as: NORCO   Take 2  tablets by mouth every 4 (four) hours as needed for pain.      indomethacin 25 MG capsule   Commonly known as: INDOCIN   Take 25 mg by mouth 2 (two) times daily with a meal. For gout flares      lisinopril 20 MG tablet   Commonly known as: PRINIVIL,ZESTRIL   Take 20 mg by mouth daily.      metFORMIN 1000 MG tablet   Commonly known as: GLUCOPHAGE   Take 1,000 mg by mouth 2 (two) times daily with a meal.      naproxen 375 MG tablet   Commonly known as: NAPROSYN   Take 375 mg by mouth 2 (two) times daily with a meal.      simvastatin 40 MG tablet   Commonly known as: ZOCOR   Take 20 mg by mouth daily.             Signed: Jatavius Ellenwood P 04/20/2012, 8:00 AM

## 2012-04-20 NOTE — Care Management Note (Signed)
    Page 1 of 1   04/20/2012     2:02:13 PM   CARE MANAGEMENT NOTE 04/20/2012  Patient:  Devon Wood, Devon Wood   Account Number:  [id number]  Date Initiated:  04/20/2012  Documentation initiated by:  Onnie Boer  Subjective/Objective Assessment:   PT WAS ADMITTED FOR BACK SURGERY     Action/Plan:   PROGRESSION OF CARE AND DISCHARGE PLANNING   Anticipated DC Date:  04/20/2012   Anticipated DC Plan:  HOME/SELF CARE      DC Planning Services  CM consult      Choice offered to / List presented to:             Status of service:  Completed, signed off Medicare Important Message given?   (If response is "NO", the following Medicare IM given date fields will be blank) Date Medicare IM given:   Date Additional Medicare IM given:    Discharge Disposition:  HOME/SELF CARE  Per UR Regulation:  Reviewed for med. necessity/level of care/duration of stay  If discussed at Long Length of Stay Meetings, dates discussed:    Comments:  04/20/12 Onnie Boer, RN, BSN 1355 PT WAS DC'D TO HOME WITH SELF CARE

## 2012-04-20 NOTE — Progress Notes (Signed)
Patient ID: Devon Wood, male   DOB: 1961-05-24, 51 y.o.   MRN: [id number] Patient is doing very well no leg pain back pains well-controlled on oral pain meds his angling and voiding spontaneously tolerating regular diet.

## 2012-04-23 DIAGNOSIS — K279 Peptic ulcer, site unspecified, unspecified as acute or chronic, without hemorrhage or perforation: Secondary | ICD-10-CM

## 2012-04-23 HISTORY — DX: Peptic ulcer, site unspecified, unspecified as acute or chronic, without hemorrhage or perforation: K27.9

## 2012-08-02 ENCOUNTER — Emergency Department (HOSPITAL_COMMUNITY): Payer: BC Managed Care – PPO

## 2012-08-02 ENCOUNTER — Encounter (HOSPITAL_COMMUNITY): Payer: Self-pay | Admitting: Emergency Medicine

## 2012-08-02 ENCOUNTER — Emergency Department (HOSPITAL_COMMUNITY)
Admission: EM | Admit: 2012-08-02 | Discharge: 2012-08-02 | Disposition: A | Discharge status: Home / Self Care | Payer: BC Managed Care – PPO | Attending: Emergency Medicine | Admitting: Emergency Medicine

## 2012-08-02 ENCOUNTER — Other Ambulatory Visit: Payer: Self-pay

## 2012-08-02 DIAGNOSIS — K219 Gastro-esophageal reflux disease without esophagitis: Secondary | ICD-10-CM | POA: Insufficient documentation

## 2012-08-02 DIAGNOSIS — R079 Chest pain, unspecified: Secondary | ICD-10-CM

## 2012-08-02 DIAGNOSIS — Z8711 Personal history of peptic ulcer disease: Secondary | ICD-10-CM | POA: Insufficient documentation

## 2012-08-02 DIAGNOSIS — Z7982 Long term (current) use of aspirin: Secondary | ICD-10-CM | POA: Insufficient documentation

## 2012-08-02 DIAGNOSIS — F411 Generalized anxiety disorder: Secondary | ICD-10-CM | POA: Insufficient documentation

## 2012-08-02 DIAGNOSIS — M109 Gout, unspecified: Secondary | ICD-10-CM | POA: Insufficient documentation

## 2012-08-02 DIAGNOSIS — I2510 Atherosclerotic heart disease of native coronary artery without angina pectoris: Secondary | ICD-10-CM | POA: Insufficient documentation

## 2012-08-02 DIAGNOSIS — G8929 Other chronic pain: Secondary | ICD-10-CM | POA: Insufficient documentation

## 2012-08-02 DIAGNOSIS — R209 Unspecified disturbances of skin sensation: Secondary | ICD-10-CM | POA: Insufficient documentation

## 2012-08-02 DIAGNOSIS — R0602 Shortness of breath: Secondary | ICD-10-CM | POA: Insufficient documentation

## 2012-08-02 DIAGNOSIS — E7800 Pure hypercholesterolemia, unspecified: Secondary | ICD-10-CM | POA: Insufficient documentation

## 2012-08-02 DIAGNOSIS — Z8709 Personal history of other diseases of the respiratory system: Secondary | ICD-10-CM | POA: Insufficient documentation

## 2012-08-02 DIAGNOSIS — I10 Essential (primary) hypertension: Secondary | ICD-10-CM | POA: Insufficient documentation

## 2012-08-02 DIAGNOSIS — F419 Anxiety disorder, unspecified: Secondary | ICD-10-CM

## 2012-08-02 DIAGNOSIS — Z9861 Coronary angioplasty status: Secondary | ICD-10-CM | POA: Insufficient documentation

## 2012-08-02 DIAGNOSIS — E119 Type 2 diabetes mellitus without complications: Secondary | ICD-10-CM | POA: Insufficient documentation

## 2012-08-02 DIAGNOSIS — Z79899 Other long term (current) drug therapy: Secondary | ICD-10-CM | POA: Insufficient documentation

## 2012-08-02 DIAGNOSIS — Z87891 Personal history of nicotine dependence: Secondary | ICD-10-CM | POA: Insufficient documentation

## 2012-08-02 DIAGNOSIS — M549 Dorsalgia, unspecified: Secondary | ICD-10-CM | POA: Insufficient documentation

## 2012-08-02 HISTORY — DX: Anxiety disorder, unspecified: F41.9

## 2012-08-02 LAB — CBC WITH DIFFERENTIAL/PLATELET
Basophils Absolute: 0 K/uL (ref 0.0–0.1)
Basophils Relative: 0 % (ref 0–1)
Eosinophils Absolute: 0 10*3/uL (ref 0.0–0.7)
Eosinophils Relative: 1 % (ref 0–5)
HCT: 41.3 % (ref 39.0–52.0)
Hemoglobin: 14.1 g/dL (ref 13.0–17.0)
Lymphocytes Relative: 26 % (ref 12–46)
Lymphs Abs: 1.6 10*3/uL (ref 0.7–4.0)
MCH: 29.7 pg (ref 26.0–34.0)
MCHC: 34.1 g/dL (ref 30.0–36.0)
MCV: 87.1 fL (ref 78.0–100.0)
Monocytes Absolute: 0.7 K/uL (ref 0.1–1.0)
Monocytes Relative: 11 % (ref 3–12)
Neutro Abs: 4 K/uL (ref 1.7–7.7)
Neutrophils Relative %: 63 % (ref 43–77)
Platelets: 203 10*3/uL (ref 150–400)
RBC: 4.74 MIL/uL (ref 4.22–5.81)
RDW: 12.7 % (ref 11.5–15.5)
WBC: 6.3 10*3/uL (ref 4.0–10.5)

## 2012-08-02 LAB — COMPREHENSIVE METABOLIC PANEL
ALT: 65 U/L — ABNORMAL HIGH (ref 0–53)
Alkaline Phosphatase: 54 U/L (ref 39–117)
BUN: 15 mg/dL (ref 6–23)
CO2: 23 mEq/L (ref 19–32)
Calcium: 9.3 mg/dL (ref 8.4–10.5)
GFR calc Af Amer: >90 mL/min (ref >90)
GFR calc non Af Amer: >90 mL/min (ref >90)
Glucose, Bld: 264 mg/dL — ABNORMAL HIGH (ref 70–99)
Sodium: 130 mEq/L — ABNORMAL LOW (ref 135–145)

## 2012-08-02 LAB — TROPONIN I: Troponin I: <0.3 ng/mL (ref <0.30)

## 2012-08-02 LAB — COMPREHENSIVE METABOLIC PANEL WITH GFR
AST: 53 U/L — ABNORMAL HIGH (ref 0–37)
Albumin: 4 g/dL (ref 3.5–5.2)
Chloride: 95 meq/L — ABNORMAL LOW (ref 96–112)
Creatinine, Ser: 0.84 mg/dL (ref 0.50–1.35)
Potassium: 4.4 meq/L (ref 3.5–5.1)
Total Bilirubin: 0.4 mg/dL (ref 0.3–1.2)
Total Protein: 7.8 g/dL (ref 6.0–8.3)

## 2012-08-02 MED ORDER — LORAZEPAM 1 MG PO TABS
1.0000 mg | ORAL_TABLET | Freq: Once | ORAL | Status: AC
Start: 2012-08-02 — End: 2012-08-02
  Administered 2012-08-02: 1 mg via ORAL
  Filled 2012-08-02: qty 1

## 2012-08-02 MED ORDER — ALPRAZOLAM 0.5 MG PO TABS: 0.5000 mg | ORAL_TABLET | Freq: Three times a day (TID) | ORAL | Status: DC | PRN

## 2012-08-02 NOTE — ED Provider Notes (Signed)
History     CSN: [id number]  Arrival date & time 08/02/12  0740   First MD Initiated Contact with Patient 08/02/12 [id number]      Chief Complaint  Patient presents with  . Chest Pain    (Consider location/radiation/quality/duration/timing/severity/associated sxs/prior treatment) HPI Comments: Pt with h/o CAD s/p stent in 2004 by Dr. Jenne Campus for a 80% blocked artery per patient.  No problems with CP since then except in 2011 and had another cath by Dr. Marland KitchenC" with no new blockages.  He reports ince Monday at work, he has had intermittent chest pain local to left chest wall, reproducible on his own with palpation, but also get light headed and dizzy, slightly SOB,  has left arm numbness and tingling.  No weakness.  He thinks his speech gets slurred intermittently but his spouse doesn't agree.  He also attributes to possibly anxiety attack.  He has never previously had, but reports more stress esp at job where there has been many changes and he fears for job security.  Spouse agrees that pt is somewhat of a nervious person in general, but has never had medications for it.  No sweats, nausea.  Pt reports he walks for total of 4-5 miles while at work, and denies that it comes on or worsens with walking.  This AM, he was doing AM chores when symptoms occurred.  He has taken his usual AM meds except percocet which he occasionally takes for chronic back pains.  He has sig h/o HTN, DM, hyperlipidemia as well.    Patient is a 51 y.o. male presenting with chest pain. The history is provided by the patient, the spouse and medical records.  Chest Pain Primary symptoms include shortness of breath. Pertinent negatives for primary symptoms include no fever, no cough and no nausea.  Associated symptoms include numbness.  Pertinent negatives for associated symptoms include no diaphoresis and no weakness.     Past Medical History  Diagnosis Date  . Hypercholesteremia     takes Simvastatin daily  . Chronic back  pain     L3 buldging disc;DDD  . Peptic ulcer   . Hypertension     takes Lisinopril daily  . Bronchitis     hx of;last time couple of yrs ago  . Joint pain   . GERD (gastroesophageal reflux disease)     takes Pepcid daily  . Kidney stones     "active ones at present"on the left side  . Nocturia   . Diabetes mellitus     takes Glipizide and Metformin daily  . Bronchitis     hx of  . Gout     hx of  . Coronary artery disease 2004    stent placement, sees Dr "C" [facility]  . Complication of anesthesia     20-30 second asystole episode during induction  04/2003,  was on b-blocker tx at the time, found to have 80% LAD lesion s/p stent (but unclear if CAD was the direct cause)     Past Surgical History  Procedure Date  . Coronary stent placement   . Lumbar fusion 2004  . Hand surgery 1980    right  . Tonsillectomy 1969  . Coronary angioplasty 12/2009    1stent  . Esophagogastroduodenoscopy in the 90's  . Lumbar disc surgery 2002    L5    Family History  Problem Relation Age of Onset  . Anesthesia problems Neg Hx   . Hypotension Neg Hx   .  Malignant hyperthermia Neg Hx   . Pseudochol deficiency Neg Hx     History  Substance Use Topics  . Smoking status: Former Games developer  . Smokeless tobacco: Not on file     Comment: quit 19yrs ago  . Alcohol Use: Yes     Comment: occasionally      Review of Systems  Constitutional: Negative for fever, chills and diaphoresis.  Respiratory: Positive for shortness of breath. Negative for cough.   Cardiovascular: Positive for chest pain.  Gastrointestinal: Negative for nausea.  Genitourinary: Negative for difficulty urinating.  Musculoskeletal: Positive for back pain.  Neurological: Positive for numbness. Negative for weakness.  Psychiatric/Behavioral: The patient is nervous/anxious.   All other systems reviewed and are negative.    Allergies  Erythromycin  Home Medications   Current Outpatient Rx  Name   Route  Sig  Dispense  Refill  . ALLOPURINOL 100 MG PO TABS   Oral   Take 200 mg by mouth daily.           . ASPIRIN 81 MG PO TABS   Oral   Take 81 mg by mouth daily.         . COLCHICINE 0.6 MG PO TABS   Oral   Take 0.6 mg by mouth daily as needed. For gout flares          . FAMOTIDINE 20 MG PO TABS   Oral   Take 20 mg by mouth daily.          Marland Kitchen GLIPIZIDE 10 MG PO TABS   Oral   Take 10 mg by mouth 2 (two) times daily before a meal.           . HYDROCODONE-ACETAMINOPHEN 7.5-325 MG PO TABS   Oral   Take 1 tablet by mouth every 6 (six) hours as needed. For pain         . INDOMETHACIN 25 MG PO CAPS   Oral   Take 25 mg by mouth 2 (two) times daily with a meal. For gout flares          . LISINOPRIL 20 MG PO TABS   Oral   Take 20 mg by mouth daily.           Marland Kitchen METFORMIN HCL 1000 MG PO TABS   Oral   Take 1,000 mg by mouth 2 (two) times daily with a meal.           . NAPROXEN 375 MG PO TABS   Oral   Take 375 mg by mouth 2 (two) times daily with a meal.         . SIMVASTATIN 40 MG PO TABS   Oral   Take 20 mg by mouth daily.             BP 158/86  Pulse 76  Resp 22  SpO2 100%  Physical Exam  Nursing note and vitals reviewed. Constitutional: He is oriented to person, place, and time. He appears well-developed and well-nourished. No distress.  HENT:  Head: Atraumatic.  Eyes: EOM are normal. No scleral icterus.  Neck: Normal range of motion. Neck supple.  Cardiovascular: Normal rate and regular rhythm.   Pulmonary/Chest: Effort normal. No respiratory distress.  Abdominal: Soft. He exhibits no distension. There is no tenderness.  Musculoskeletal: He exhibits no edema and no tenderness.  Neurological: He is alert and oriented to person, place, and time. He has normal strength. No cranial nerve deficit or sensory deficit. He exhibits normal muscle  tone. GCS eye subscore is 4. GCS verbal subscore is 5. GCS motor subscore is 6.  Skin: Skin is warm  and dry. He is not diaphoretic.  Psychiatric: He has a normal mood and affect. His behavior is normal. Judgment and thought content normal.    ED Course  Procedures (including critical care time)  Labs Reviewed  COMPREHENSIVE METABOLIC PANEL - Abnormal; Notable for the following:    Sodium 130 (*)     Chloride 95 (*)     Glucose, Bld 264 (*)     AST 53 (*)     ALT 65 (*)     All other components within normal limits  CBC WITH DIFFERENTIAL  TROPONIN I   Dg Chest 2 View  08/02/2012  *RADIOLOGY REPORT*  Clinical Data: No chest pain  CHEST - 2 VIEW  Comparison: Chest radiograph 01/11/2012  Findings: Normal mediastinum and cardiac silhouette.  Normal pulmonary  vasculature.  No evidence of effusion, infiltrate, or pneumothorax.  No acute bony abnormality. Degenerative osteophytosis of the thoracic spine.  IMPRESSION: No acute cardiopulmonary process.   Original Report Authenticated By: Genevive Bi, M.D.      No diagnosis found.  ra sat is 100% and i interpret to be normal  ECG at time 0740 shows NSR at rate 76, normal axis, no ST or T wave abn's.  Borderline poor r wave progression V2-V3, similar in appearance to prior ECG from 01/11/12.    9:51 AM Troponin is normal, electrolytes are ok except glucose is 260, NA is slightly low, likely related to hyperglycemia.  Feels less anxious and overall improved with po ativan.     10:26 AM Spoke to Dr. Salena Saner who will ensure that he receives follow up next week.  MDM  No focal deficits except subjective numbness to left hand.  Pt with new stressors at work which may be causing overall anxiety since job is threatened, holidays are here and he has nervous personality per spouse.  Pt had clean cath in March 2011, so I highly doubt new symptomatic and clinically sig CAD developed in 2.5 years time.  Will check a troponin, ECG shows no sig ischemia.  Will try some ativan here.     I reviewed PA and Lateral CXR myself and interpret as no edema,  no infiltrates, no free air, heart border is not enlarged.              Gavin Pound. Malyssa Maris, MD 08/02/12 1026

## 2012-08-02 NOTE — Discharge Instructions (Signed)
 Anxiety and Panic Attacks Your caregiver has informed you that you are having an anxiety or panic attack. There may be many forms of this. Most of the time these attacks come suddenly and without warning. They come at any time of day, including periods of sleep, and at any time of life. They may be strong and unexplained. Although panic attacks are very scary, they are physically harmless. Sometimes the cause of your anxiety is not known. Anxiety is a protective mechanism of the body in its fight or flight mechanism. Most of these perceived danger situations are actually nonphysical situations (such as anxiety over losing a job). CAUSES  The causes of an anxiety or panic attack are many. Panic attacks may occur in otherwise healthy people given a certain set of circumstances. There may be a genetic cause for panic attacks. Some medications may also have anxiety as a side effect. SYMPTOMS  Some of the most common feelings are:  Intense terror.  Dizziness, feeling faint.  Hot and cold flashes.  Fear of going crazy.  Feelings that nothing is real.  Sweating.  Shaking.  Chest pain or a fast heartbeat (palpitations).  Smothering, choking sensations.  Feelings of impending doom and that death is near.  Tingling of extremities, this may be from over-breathing.  Altered reality (derealization).  Being detached from yourself (depersonalization). Several symptoms can be present to make up anxiety or panic attacks. DIAGNOSIS  The evaluation by your caregiver will depend on the type of symptoms you are experiencing. The diagnosis of anxiety or panic attack is made when no physical illness can be determined to be a cause of the symptoms. TREATMENT  Treatment to prevent anxiety and panic attacks may include:  Avoidance of circumstances that cause anxiety.  Reassurance and relaxation.  Regular exercise.  Relaxation therapies, such as yoga.  Psychotherapy with a psychiatrist or  therapist.  Avoidance of caffeine, alcohol and illegal drugs.  Prescribed medication. SEEK IMMEDIATE MEDICAL CARE IF:   You experience panic attack symptoms that are different than your usual symptoms.  You have any worsening or concerning symptoms. Document Released: 08/22/2005 Document Revised: 11/14/2011 Document Reviewed: 12/24/2009 [facility] Patient Information 2013 [place], [place].    Chest Pain (Nonspecific) It is often hard to give a specific diagnosis for the cause of chest pain. There is always a chance that your pain could be related to something serious, such as a heart attack or a blood clot in the lungs. You need to follow up with your caregiver for further evaluation. CAUSES   Heartburn.  Pneumonia or bronchitis.  Anxiety or stress.  Inflammation around your heart (pericarditis) or lung (pleuritis or pleurisy).  A blood clot in the lung.  A collapsed lung (pneumothorax). It can develop suddenly on its own (spontaneous pneumothorax) or from injury (trauma) to the chest.  Shingles infection (herpes zoster virus). The chest wall is composed of bones, muscles, and cartilage. Any of these can be the source of the pain.  The bones can be bruised by injury.  The muscles or cartilage can be strained by coughing or overwork.  The cartilage can be affected by inflammation and become sore (costochondritis). DIAGNOSIS  Lab tests or other studies, such as X-rays, electrocardiography, stress testing, or cardiac imaging, may be needed to find the cause of your pain.  TREATMENT   Treatment depends on what may be causing your chest pain. Treatment may include:  Acid blockers for heartburn.  Anti-inflammatory medicine.  Pain medicine for inflammatory conditions.  Antibiotics if an infection is present.  You may be advised to change lifestyle habits. This includes stopping smoking and avoiding alcohol, caffeine, and chocolate.  You may be advised to keep your head  raised (elevated) when sleeping. This reduces the chance of acid going backward from your stomach into your esophagus.  Most of the time, nonspecific chest pain will improve within 2 to 3 days with rest and mild pain medicine. HOME CARE INSTRUCTIONS   If antibiotics were prescribed, take your antibiotics as directed. Finish them even if you start to feel better.  For the next few days, avoid physical activities that bring on chest pain. Continue physical activities as directed.  Do not smoke.  Avoid drinking alcohol.  Only take over-the-counter or prescription medicine for pain, discomfort, or fever as directed by your caregiver.  Follow your caregiver's suggestions for further testing if your chest pain does not go away.  Keep any follow-up appointments you made. If you do not go to an appointment, you could develop lasting (chronic) problems with pain. If there is any problem keeping an appointment, you must call to reschedule. SEEK MEDICAL CARE IF:   You think you are having problems from the medicine you are taking. Read your medicine instructions carefully.  Your chest pain does not go away, even after treatment.  You develop a rash with blisters on your chest. SEEK IMMEDIATE MEDICAL CARE IF:   You have increased chest pain or pain that spreads to your arm, neck, jaw, back, or abdomen.  You develop shortness of breath, an increasing cough, or you are coughing up blood.  You have severe back or abdominal pain, feel nauseous, or vomit.  You develop severe weakness, fainting, or chills.  You have a fever. THIS IS AN EMERGENCY. Do not wait to see if the pain will go away. Get medical help at once. Call your local emergency services (911 in [place] Do not drive yourself to the hospital. MAKE SURE YOU:   Understand these instructions.  Will watch your condition.  Will get help right away if you are not doing well or get worse. Document Released: 06/01/2005 Document Revised:  11/14/2011 Document Reviewed: 03/27/2008 [facility] Patient Information 2013 [place], [place].    Narcotic and benzodiazepine use may cause drowsiness, slowed breathing or dependence.  Please use with caution and do not drive, operate machinery or watch young children alone while taking them.  Taking combinations of these medications or drinking alcohol will potentiate these effects.

## 2012-08-02 NOTE — ED Notes (Signed)
Pt c/o left sided CP with radiation down left arm with tingling; pt sts SOB and dizziness; pt sts starting intermittently on Monday but worse today

## 2013-10-10 ENCOUNTER — Ambulatory Visit (INDEPENDENT_AMBULATORY_CARE_PROVIDER_SITE_OTHER): Payer: BC Managed Care – PPO | Admitting: Cardiovascular Disease

## 2013-10-10 ENCOUNTER — Encounter: Payer: Self-pay | Admitting: *Deleted

## 2013-10-10 ENCOUNTER — Encounter: Payer: Self-pay | Admitting: Cardiovascular Disease

## 2013-10-10 VITALS — BP 120/60 | HR 111 | Ht 68.0 in | Wt 262.9 lb

## 2013-10-10 DIAGNOSIS — M48061 Spinal stenosis, lumbar region without neurogenic claudication: Secondary | ICD-10-CM | POA: Insufficient documentation

## 2013-10-10 DIAGNOSIS — I2510 Atherosclerotic heart disease of native coronary artery without angina pectoris: Secondary | ICD-10-CM | POA: Insufficient documentation

## 2013-10-10 DIAGNOSIS — E118 Type 2 diabetes mellitus with unspecified complications: Secondary | ICD-10-CM | POA: Insufficient documentation

## 2013-10-10 DIAGNOSIS — I10 Essential (primary) hypertension: Secondary | ICD-10-CM | POA: Insufficient documentation

## 2013-10-10 DIAGNOSIS — E782 Mixed hyperlipidemia: Secondary | ICD-10-CM | POA: Insufficient documentation

## 2013-10-10 HISTORY — DX: Type 2 diabetes mellitus with unspecified complications: E11.8

## 2013-10-10 HISTORY — DX: Mixed hyperlipidemia: E78.2

## 2013-10-10 HISTORY — DX: Morbid (severe) obesity due to excess calories: E66.01

## 2013-10-10 HISTORY — DX: Atherosclerotic heart disease of native coronary artery without angina pectoris: I25.10

## 2013-10-10 HISTORY — DX: Essential (primary) hypertension: I10

## 2013-10-10 NOTE — Assessment & Plan Note (Signed)
Strongly encouraged further efforts at weight loss. He has made some significant improvements over the last year

## 2013-10-10 NOTE — Patient Instructions (Signed)
Your physician recommends that you schedule a follow-up appointment in: ONE YEAR 

## 2013-10-10 NOTE — Assessment & Plan Note (Signed)
He is asymptomatic. We really don't know what his angina might manifest as. His initial diagnosis was secondary to an arrhythmia followed by a nuclear stress test that showed ischemia. He had a normal assessment in 2011. The focuses on risk factor reduction.

## 2013-10-10 NOTE — Assessment & Plan Note (Signed)
Good control. ACE inhibitor is preferred for its beneficial effect on renal function protection

## 2013-10-10 NOTE — Assessment & Plan Note (Signed)
We will review the results of his upcoming lipid test next month. Target LDL cholesterol less than 70 mg/dL. He has clear features of the metabolic syndrome and may benefit from a NMR lipid profile with determination of LDL particle number.

## 2013-10-10 NOTE — Assessment & Plan Note (Signed)
Fairly good control. Encourage further weight loss. Spent a long time discussing the metabolic syndrome, the concept of glycemic index and health changes to his diet. He'll eliminate sweets and carbohydrates with high glycemic index, increase intake of protein and unsaturated fats.

## 2013-10-10 NOTE — Progress Notes (Signed)
Patient ID: Devon Wood, male   DOB: 1960-12-31, 53 y.o.   MRN: [id number]      Reason for office visit CAD  Devon Wood is only 53 years old and has very early onset CAD. He was only 52 years old when he was diagnosed with coronary disease after evaluation for prolonged asystole during general anesthesia for a lumbar laminectomy. He had a 30 second episode of asystole. Cardiac catheterization was performed after nuclear stress test showed anterior wall ischemia. He was found to have 30% ostial left main stenosis, 80% LAD stenosis and a distal LAD 70% stenosis. The mid LAD lesion was treated with a 2.5 x 20 mm drug-eluting Cypher stent. He never had angina pectoris. In 2011 he presented with atypical symptoms and eventually underwent cardiac catheterization that showed a widely patent stent in the LAD artery in the absence of any other major stenoses. Left ventricular ejection fraction was 60-65%. He has not had cardiac symptoms since.  His major health problems have revolved around severe lumbar spine disease for which he has undergone another back surgery in 2011. He has some symptoms possibly suggestive of sciatica now and is due to see Devon Wood in the near future.  He has been morbidly obese for a long time and has multiple complications related to this including type 2 diabetes mellitus (not requiring insulin), hypertension and mixed hyperlipidemia. Diabetic control has been fairly good and his most recent hemoglobin A1c was 6.8%. He has lost about 30 pounds and is no longer morbidly obese (his BMI is just under 40). His blood pressure is well controlled. He is due to have a repeat lipid profile with his primary care physician next month. 2 years ago his total cholesterol was 141, triglycerides 89, LDL 90 and HDL 45. He has lost substantial weight since then.   Allergies  Allergen Reactions  . Erythromycin Rash    Current Outpatient Prescriptions  Medication Sig Dispense Refill  . allopurinol  (ZYLOPRIM) 100 MG tablet Take 200 mg by mouth daily.        Marland Kitchen aspirin 81 MG tablet Take 81 mg by mouth daily.      . colchicine 0.6 MG tablet Take 0.6 mg by mouth daily as needed. For gout flares       . cyclobenzaprine (FLEXERIL) 10 MG tablet Take 10 mg by mouth 3 (three) times daily as needed for muscle spasms.      Marland Kitchen escitalopram (LEXAPRO) 10 MG tablet Take 10 mg by mouth daily.      . famotidine (PEPCID) 20 MG tablet Take 20 mg by mouth daily.       Marland Kitchen glipiZIDE (GLUCOTROL) 10 MG tablet Take 5 mg by mouth 2 (two) times daily before a meal.       . indomethacin (INDOCIN) 25 MG capsule Take 25 mg by mouth 2 (two) times daily with a meal. For gout flares      . lisinopril-hydrochlorothiazide (PRINZIDE,ZESTORETIC) 20-12.5 MG per tablet Take 1 tablet by mouth daily.      . metFORMIN (GLUCOPHAGE) 1000 MG tablet Take 1,000 mg by mouth 2 (two) times daily with a meal.        . naproxen (NAPROSYN) 375 MG tablet Take 375 mg by mouth 2 (two) times daily with a meal.      . simvastatin (ZOCOR) 40 MG tablet Take 20 mg by mouth daily.         No current facility-administered medications for this visit.  Past Medical History  Diagnosis Date  . Hypercholesteremia     takes Simvastatin daily  . Chronic back pain     L3 buldging disc;DDD  . Peptic ulcer   . Hypertension     takes Lisinopril daily  . Bronchitis     hx of;last time couple of yrs ago  . Joint pain   . GERD (gastroesophageal reflux disease)     takes Pepcid daily  . Kidney stones     "active ones at present"on the left side  . Nocturia   . Diabetes mellitus     takes Glipizide and Metformin daily  . Bronchitis     hx of  . Gout     hx of  . Coronary artery disease 2004    stent placement, sees Dr "C" [facility]  . Complication of anesthesia     20-30 second asystole episode during induction  04/2003,  was on b-blocker tx at the time, found to have 80% LAD lesion s/p stent (but unclear if CAD was the direct  cause)     Past Surgical History  Procedure Laterality Date  . Coronary stent placement  05/30/2003    proximal LAD  . Lumbar fusion  2004  . Hand surgery  1980    right  . Tonsillectomy  1969  . Cardiac catheterization  11/27/2009    widely patent stent  . Esophagogastroduodenoscopy  in the 90's  . Lumbar disc surgery  2002    L5  . US echocardiography  04/30/2003    mildly dilated aortic root,mild LA enlargement,mild concentric LVH,trivial mitral and tricuspid regurg.,mild mitral annular ca+  . Nm myocar perf wall motion  05/19/2004    no evidence of ischemia    Family History  Problem Relation Age of Onset  . Anesthesia problems Neg Hx   . Hypotension Neg Hx   . Malignant hyperthermia Neg Hx   . Pseudochol deficiency Neg Hx     History   Social History  . Marital Status: Married    Spouse Name: N/A    Number of Children: N/A  . Years of Education: N/A   Occupational History  . Not on file.   Social History Main Topics  . Smoking status: Former Games developer  . Smokeless tobacco: Not on file     Comment: quit 30yrs ago  . Alcohol Use: Yes     Comment: occasionally  . Drug Use: No  . Sexual Activity: Yes   Other Topics Concern  . Not on file   Social History Narrative  . No narrative on file    Review of systems: The patient specifically denies any chest pain at rest or with exertion, dyspnea at rest or with exertion, orthopnea, paroxysmal nocturnal dyspnea, syncope, palpitations, focal neurological deficits, intermittent claudication, lower extremity edema, unexplained weight gain, cough, hemoptysis or wheezing.  The patient also denies abdominal pain, nausea, vomiting, dysphagia, diarrhea, constipation, polyuria, polydipsia, dysuria, hematuria, frequency, urgency, abnormal bleeding or bruising, fever, chills, unexpected weight changes, mood swings, change in skin or hair texture, change in voice quality, auditory or visual problems, allergic reactions or  rashes.  Is chronic low back pain and recently has been troubled by shooting pains down the posterolateral aspect of his right thigh.  He has problems with erectile dysfunction.  PHYSICAL EXAM BP 120/60  Pulse 111  Ht 5\' 8"  (1.727 m)  Wt 119.251 kg (262 lb 14.4 oz)  BMI 39.98 kg/m2  General: Alert, oriented x3,  no distress. Severe obesity does limit his cardiovascular exam Head: no evidence of trauma, PERRL, EOMI, no exophtalmos or lid lag, no myxedema, no xanthelasma; normal ears, nose and oropharynx Neck: normal jugular venous pulsations and no hepatojugular reflux; brisk carotid pulses without delay and no carotid bruits Chest: clear to auscultation, no signs of consolidation by percussion or palpation, normal fremitus, symmetrical and full respiratory excursions Cardiovascular: Unable to locate the apical impulse, regular rhythm, normal first and second heart sounds, no murmurs, rubs or gallops Abdomen: no tenderness or distention, no masses by palpation, no abnormal pulsatility or arterial bruits, normal bowel sounds, no hepatosplenomegaly Extremities: no clubbing, cyanosis or edema; 2+ radial, ulnar and brachial pulses bilaterally; 2+ right femoral, posterior tibial and dorsalis pedis pulses; 2+ left femoral, posterior tibial and dorsalis pedis pulses; no subclavian or femoral bruits Neurological: grossly nonfocal   EKG: Mild sinus tachycardia, left axis deviation almost meeting criteria for left anterior fascicular block. Little change from his previous ECG  Lipid Panel  2013 total cholesterol was 141, triglycerides 89, LDL 90 and HDL 45   BMET    Component Value Date/Time   NA 130* 08/02/2012 0822   K 4.4 08/02/2012 0822   CL 95* 08/02/2012 0822   CO2 23 08/02/2012 0822   GLUCOSE 264* 08/02/2012 0822   BUN 15 08/02/2012 0822   CREATININE 0.84 08/02/2012 0822   CALCIUM 9.3 08/02/2012 0822   GFRNONAA >90 08/02/2012 0822   GFRAA >90 08/02/2012 [id number]     ASSESSMENT AND  PLAN CAD (coronary artery disease) He is asymptomatic. We really don't know what his angina might manifest as. His initial diagnosis was secondary to an arrhythmia followed by a nuclear stress test that showed ischemia. He had a normal assessment in 2011. The focuses on risk factor reduction.  Mixed hyperlipidemia We will review the results of his upcoming lipid test next month. Target LDL cholesterol less than 70 mg/dL. He has clear features of the metabolic syndrome and may benefit from a NMR lipid profile with determination of LDL particle number.  DM type 2 causing complication Fairly good control. Encourage further weight loss. Spent a long time discussing the metabolic syndrome, the concept of glycemic index and health changes to his diet. He'll eliminate sweets and carbohydrates with high glycemic index, increase intake of protein and unsaturated fats.  HTN (hypertension) Good control. ACE inhibitor is preferred for its beneficial effect on renal function protection  Morbid obesity Strongly encouraged further efforts at weight loss. He has made some significant improvements over the last year   Patient Instructions  Your physician recommends that you schedule a follow-up appointment in: ONE YEAR.      Orders Placed This Encounter  Procedures  . EKG 12-Lead   Meds ordered this encounter  Medications  . lisinopril-hydrochlorothiazide (PRINZIDE,ZESTORETIC) 20-12.5 MG per tablet    Sig: Take 1 tablet by mouth daily.  Marland Kitchen. escitalopram (LEXAPRO) 10 MG tablet    Sig: Take 10 mg by mouth daily.  . cyclobenzaprine (FLEXERIL) 10 MG tablet    Sig: Take 10 mg by mouth 3 (three) times daily as needed for muscle spasms.    Junious SilkROITORU,Madesyn Ast  [place] Surges, MD, [facility] HospitalFACC [facility] [phone] office [phone] pager

## 2014-12-11 ENCOUNTER — Other Ambulatory Visit: Payer: Self-pay | Admitting: Gastroenterology

## 2015-03-06 ENCOUNTER — Other Ambulatory Visit: Payer: Self-pay | Admitting: Gastroenterology

## 2015-03-13 ENCOUNTER — Encounter (HOSPITAL_COMMUNITY): Payer: Self-pay | Admitting: *Deleted

## 2015-03-17 ENCOUNTER — Encounter (HOSPITAL_COMMUNITY)
Admission: RE | Disposition: A | Discharge status: Home / Self Care | Payer: Self-pay | Source: Ambulatory Visit | Attending: Gastroenterology

## 2015-03-17 ENCOUNTER — Encounter (HOSPITAL_COMMUNITY): Payer: Self-pay

## 2015-03-17 ENCOUNTER — Ambulatory Visit (HOSPITAL_COMMUNITY): Payer: BC Managed Care – PPO | Admitting: Certified Registered Nurse Anesthetist

## 2015-03-17 ENCOUNTER — Ambulatory Visit (HOSPITAL_COMMUNITY)
Admission: RE | Admit: 2015-03-17 | Discharge: 2015-03-17 | Disposition: A | Discharge status: Home / Self Care | Payer: BC Managed Care – PPO | Source: Ambulatory Visit | Attending: Gastroenterology | Admitting: Gastroenterology

## 2015-03-17 DIAGNOSIS — Z6838 Body mass index (BMI) 38.0-38.9, adult: Secondary | ICD-10-CM | POA: Diagnosis not present

## 2015-03-17 DIAGNOSIS — M1990 Unspecified osteoarthritis, unspecified site: Secondary | ICD-10-CM | POA: Diagnosis not present

## 2015-03-17 DIAGNOSIS — Z79899 Other long term (current) drug therapy: Secondary | ICD-10-CM | POA: Diagnosis not present

## 2015-03-17 DIAGNOSIS — I10 Essential (primary) hypertension: Secondary | ICD-10-CM | POA: Diagnosis not present

## 2015-03-17 DIAGNOSIS — Z955 Presence of coronary angioplasty implant and graft: Secondary | ICD-10-CM | POA: Diagnosis not present

## 2015-03-17 DIAGNOSIS — Z87891 Personal history of nicotine dependence: Secondary | ICD-10-CM | POA: Insufficient documentation

## 2015-03-17 DIAGNOSIS — M549 Dorsalgia, unspecified: Secondary | ICD-10-CM | POA: Diagnosis not present

## 2015-03-17 DIAGNOSIS — Z1211 Encounter for screening for malignant neoplasm of colon: Secondary | ICD-10-CM | POA: Insufficient documentation

## 2015-03-17 DIAGNOSIS — G4730 Sleep apnea, unspecified: Secondary | ICD-10-CM | POA: Diagnosis not present

## 2015-03-17 DIAGNOSIS — N289 Disorder of kidney and ureter, unspecified: Secondary | ICD-10-CM | POA: Diagnosis not present

## 2015-03-17 DIAGNOSIS — G8929 Other chronic pain: Secondary | ICD-10-CM | POA: Insufficient documentation

## 2015-03-17 DIAGNOSIS — I2510 Atherosclerotic heart disease of native coronary artery without angina pectoris: Secondary | ICD-10-CM | POA: Diagnosis not present

## 2015-03-17 DIAGNOSIS — E119 Type 2 diabetes mellitus without complications: Secondary | ICD-10-CM | POA: Diagnosis not present

## 2015-03-17 DIAGNOSIS — M109 Gout, unspecified: Secondary | ICD-10-CM | POA: Diagnosis not present

## 2015-03-17 DIAGNOSIS — K219 Gastro-esophageal reflux disease without esophagitis: Secondary | ICD-10-CM | POA: Insufficient documentation

## 2015-03-17 HISTORY — PX: COLONOSCOPY WITH PROPOFOL: SHX5780

## 2015-03-17 HISTORY — DX: Anxiety disorder, unspecified: F41.9

## 2015-03-17 SURGERY — COLONOSCOPY WITH PROPOFOL
Anesthesia: Monitor Anesthesia Care

## 2015-03-17 MED ORDER — PROPOFOL 10 MG/ML IV BOLUS
INTRAVENOUS | Status: AC
  Filled 2015-03-17: qty 20

## 2015-03-17 MED ORDER — LIDOCAINE HCL (CARDIAC) 20 MG/ML IV SOLN
INTRAVENOUS | Status: DC | PRN
  Administered 2015-03-17: 50 mg via INTRAVENOUS

## 2015-03-17 MED ORDER — PROPOFOL 10 MG/ML IV BOLUS
INTRAVENOUS | Status: DC | PRN
  Administered 2015-03-17 (×2): 50 mg via INTRAVENOUS

## 2015-03-17 MED ORDER — PROPOFOL INFUSION 10 MG/ML OPTIME
INTRAVENOUS | Status: DC | PRN
  Administered 2015-03-17: 120 ug/kg/min via INTRAVENOUS

## 2015-03-17 MED ORDER — LACTATED RINGERS IV SOLN
INTRAVENOUS | Status: DC
  Administered 2015-03-17: 10:00:00 via INTRAVENOUS

## 2015-03-17 MED ORDER — SODIUM CHLORIDE 0.9 % IV SOLN: INTRAVENOUS | Status: DC

## 2015-03-17 MED ORDER — ACETAMINOPHEN 10 MG/ML IV SOLN: INTRAVENOUS | Status: DC | PRN

## 2015-03-17 MED ORDER — LIDOCAINE HCL (CARDIAC) 20 MG/ML IV SOLN
INTRAVENOUS | Status: AC
  Filled 2015-03-17: qty 5

## 2015-03-17 SURGICAL SUPPLY — 22 items

## 2015-03-17 NOTE — Transfer of Care (Signed)
Immediate Anesthesia Transfer of Care Note  Patient: Devon Wood  Procedure(s) Performed: Procedure(s): COLONOSCOPY WITH PROPOFOL (N/A)  Patient Location: PACU  Anesthesia Type:MAC  Level of Consciousness: awake, alert  and oriented  Airway & Oxygen Therapy: Patient Spontanous Breathing and Patient connected to face mask oxygen  Post-op Assessment: Report given to RN and Post -op Vital signs reviewed and stable  Post vital signs: Reviewed and stable  Last Vitals:  Filed Vitals:   03/17/15 0935  BP: 152/81  Pulse: 87  Temp: 37 C  Resp: 14    Complications: No apparent anesthesia complications

## 2015-03-17 NOTE — H&P (Signed)
  Procedure: Baseline screening colonoscopy. No family history of colon cancer.  History: The patient is a 54 year old male born 12//1962. He is scheduled to undergo his first screening colonoscopy with polypectomy to prevent colon cancer. He has coronary artery disease and a coronary artery stent was placed in 2004. He takes aspirin 81 mg daily to prevent stent occlusion.  Past medical history: Type 2 diabetes mellitus. Hypertension. Coronary artery disease. Coronary artery stent placement in 2004. Hypercholesterolemia. Gout. Chronic back pain post back surgery. Gastroesophageal reflux. Lumbar back surgery.  Medication allergies: Erythromycin  Exam: The patient is alert and lying comfortably on the endoscopy stretcher. Abdomen is soft and nontender to palpation. Lungs are clear to auscultation. Cardiac exam reveals a regular rhythm.  Plan: Proceed with baseline screening colonoscopy

## 2015-03-17 NOTE — Anesthesia Preprocedure Evaluation (Addendum)
Anesthesia Evaluation  Patient identified by MRN, date of birth, ID band Patient awake    Reviewed: Allergy & Precautions, H&P , NPO status , Patient's Chart, lab work & pertinent test results  History of Anesthesia Complications (+) history of anesthetic complications  Airway Mallampati: II  TM Distance: >3 FB Neck ROM: full    Dental no notable dental hx.    Pulmonary sleep apnea , former smoker,  breath sounds clear to auscultation  Pulmonary exam normal       Cardiovascular hypertension, Pt. on medications + CAD and + Cardiac Stents Normal cardiovascular examRhythm:regular Rate:Normal     Neuro/Psych negative psych ROS   GI/Hepatic PUD, GERD-  Medicated and Controlled,  Endo/Other  diabetes, Well Controlled, Type 2, Oral Hypoglycemic AgentsMorbid obesity  Renal/GU Renal diseaseHx renal stones     Musculoskeletal  (+) Arthritis -, Osteoarthritis,  Hx gout   Abdominal   Peds  Hematology negative hematology ROS (+)   Anesthesia Other Findings   Reproductive/Obstetrics                            Anesthesia Physical  Anesthesia Plan  ASA: III  Anesthesia Plan: MAC   Post-op Pain Management:    Induction:   Airway Management Planned: Simple Face Mask  Additional Equipment:   Intra-op Plan:   Post-operative Plan:   Informed Consent: I have reviewed the patients History and Physical, chart, labs and discussed the procedure including the risks, benefits and alternatives for the proposed anesthesia with the patient or authorized representative who has indicated his/her understanding and acceptance.   Dental advisory given  Plan Discussed with: Anesthesiologist and Surgeon  Anesthesia Plan Comments:         Anesthesia Quick Evaluation

## 2015-03-17 NOTE — Discharge Instructions (Signed)
Monitored Anesthesia Care Monitored anesthesia care is an anesthesia service for a medical procedure. Anesthesia is the loss of the ability to feel pain. It is produced by medicines called anesthetics. It may affect a small area of your body (local anesthesia), a large area of your body (regional anesthesia), or your entire body (general anesthesia). The need for monitored anesthesia care depends your procedure, your condition, and the potential need for regional or general anesthesia. It is often provided during procedures where:   General anesthesia may be needed if there are complications. This is because you need special care when you are under general anesthesia.   You will be under local or regional anesthesia. This is so that you are able to have higher levels of anesthesia if needed.   You will receive calming medicines (sedatives). This is especially the case if sedatives are given to put you in a semi-conscious state of relaxation (deep sedation). This is because the amount of sedative needed to produce this state can be hard to predict. Too much of a sedative can produce general anesthesia. Monitored anesthesia care is performed by one or more health care providers who have special training in all types of anesthesia. You will need to meet with these health care providers before your procedure. During this meeting, they will ask you about your medical history. They will also give you instructions to follow. (For example, you will need to stop eating and drinking before your procedure. You may also need to stop or change medicines you are taking.) During your procedure, your health care providers will stay with you. They will:   Watch your condition. This includes watching your blood pressure, breathing, and level of pain.   Diagnose and treat problems that occur.   Give medicines if they are needed. These may include calming medicines (sedatives) and anesthetics.   Make sure you are  comfortable.  Having monitored anesthesia care does not necessarily mean that you will be under anesthesia. It does mean that your health care providers will be able to manage anesthesia if you need it or if it occurs. It also means that you will be able to have a different type of anesthesia than you are having if you need it. When your procedure is complete, your health care providers will continue to watch your condition. They will make sure any medicines wear off before you are allowed to go home.  Document Released: 05/18/2005 Document Revised: 01/06/2014 Document Reviewed: 10/03/2012 [facility] Patient Information 2015 [place], [place]. This information is not intended to replace advice given to you by your health care provider. Make sure you discuss any questions you have with your health care provider. Colonoscopy A colonoscopy is an exam to look at the entire large intestine (colon). This exam can help find problems such as tumors, polyps, inflammation, and areas of bleeding. The exam takes about 1 hour.  LET [facility] LLCYOUR HEALTH CARE PROVIDER KNOW ABOUT:   Any allergies you have.  All medicines you are taking, including vitamins, herbs, eye drops, creams, and over-the-counter medicines.  Previous problems you or members of your family have had with the use of anesthetics.  Any blood disorders you have.  Previous surgeries you have had.  Medical conditions you have. RISKS AND COMPLICATIONS  Generally, this is a safe procedure. However, as with any procedure, complications can occur. Possible complications include:  Bleeding.  Tearing or rupture of the colon wall.  Reaction to medicines given during the exam.  Infection (rare). BEFORE  THE PROCEDURE  °· Ask your health care provider about changing or stopping your regular medicines. °· You may be prescribed an oral bowel prep. This involves drinking a large amount of medicated liquid, starting the day before your procedure. The liquid will  cause you to have multiple loose stools until your stool is almost clear or light green. This cleans out your colon in preparation for the procedure. °· Do not eat or drink anything else once you have started the bowel prep, unless your health care provider tells you it is safe to do so. °· Arrange for someone to drive you home after the procedure. °PROCEDURE  °· You will be given medicine to help you relax (sedative). °· You will lie on your side with your knees bent. °· A long, flexible tube with a light and camera on the end (colonoscope) will be inserted through the rectum and into the colon. The camera sends video back to a computer screen as it moves through the colon. The colonoscope also releases carbon dioxide gas to inflate the colon. This helps your health care provider see the area better. °· During the exam, your health care provider may take a small tissue sample (biopsy) to be examined under a microscope if any abnormalities are found. °· The exam is finished when the entire colon has been viewed. °AFTER THE PROCEDURE  °· Do not drive for 24 hours after the exam. °· You may have a small amount of blood in your stool. °· You may pass moderate amounts of gas and have mild abdominal cramping or bloating. This is caused by the gas used to inflate your colon during the exam. °· Ask when your test results will be ready and how you will get your results. Make sure you get your test results. °Document Released: 08/19/2000 Document Revised: 06/12/2013 Document Reviewed: 04/29/2013 °Exit[employer]® Patient Information ©2015 [employer]. This information is not intended to replace advice given to you by your health care provider. Make sure you discuss any questions you have with your health care provider. ° °

## 2015-03-17 NOTE — Anesthesia Postprocedure Evaluation (Signed)
  Anesthesia Post-op Note  Patient: Devon Wood  Procedure(s) Performed: Procedure(s) (LRB): COLONOSCOPY WITH PROPOFOL (N/A)  Patient Location: PACU  Anesthesia Type: MAC  Level of Consciousness: awake and alert   Airway and Oxygen Therapy: Patient Spontanous Breathing  Post-op Pain: mild  Post-op Assessment: Post-op Vital signs reviewed, Patient's Cardiovascular Status Stable, Respiratory Function Stable, Patent Airway and No signs of Nausea or vomiting  Last Vitals:  Filed Vitals:   03/17/15 1126  BP:   Pulse: 69  Temp:   Resp: 9    Post-op Vital Signs: stable   Complications: No apparent anesthesia complications

## 2015-03-17 NOTE — Op Note (Signed)
Procedure: Baseline screening colonoscopy  Endoscopist: Danise EdgeMartin Johnson  Premedication: Propofol administered by anesthesia  Procedure: The patient was placed in the left lateral decubitus position. Anal inspection and digital rectal exam were normal. The Pentax pediatric endoscope was introduced into the rectum and advanced to the cecum. A normal-appearing appendiceal orifice and ileocecal valve were identified. Colonic preparation for the exam today was good. Withdrawal time was 11 minutes  Rectum. Normal. Retroflexed view of the distal rectum was normal  Sigmoid colon and descending colon. Normal  Splenic flexure. Normal  Transverse colon. Normal  Hepatic flexure. Normal  Ascending colon. Normal  Cecum and ileocecal valve. Normal  Assessment: Normal screening colonoscopy  Recommendation: Schedule repeat screening colonoscopy in 10 years

## 2015-03-18 ENCOUNTER — Encounter (HOSPITAL_COMMUNITY): Payer: Self-pay | Admitting: Gastroenterology

## 2015-03-23 LAB — GLUCOSE, CAPILLARY: Glucose-Capillary: 142 mg/dL — ABNORMAL HIGH (ref 65–99)

## 2016-06-09 ENCOUNTER — Other Ambulatory Visit: Payer: Self-pay | Admitting: Orthopaedic Surgery

## 2016-06-09 DIAGNOSIS — M25561 Pain in right knee: Secondary | ICD-10-CM

## 2016-06-12 ENCOUNTER — Ambulatory Visit
Admission: RE | Admit: 2016-06-12 | Discharge: 2016-06-12 | Disposition: A | Discharge status: Home / Self Care | Payer: BC Managed Care – PPO | Source: Ambulatory Visit | Attending: Orthopaedic Surgery | Admitting: Orthopaedic Surgery

## 2016-06-12 DIAGNOSIS — M25561 Pain in right knee: Secondary | ICD-10-CM

## 2017-01-02 ENCOUNTER — Ambulatory Visit: Payer: Self-pay

## 2017-01-02 ENCOUNTER — Other Ambulatory Visit: Payer: Self-pay | Admitting: Occupational Medicine

## 2017-01-02 DIAGNOSIS — M545 Low back pain: Secondary | ICD-10-CM

## 2017-03-20 ENCOUNTER — Other Ambulatory Visit: Payer: Self-pay | Admitting: Neurosurgery

## 2017-03-27 ENCOUNTER — Encounter (HOSPITAL_COMMUNITY): Payer: Self-pay

## 2017-03-27 NOTE — Pre-Procedure Instructions (Signed)
Gerhardt Gleed Barb  03/27/2017      [id number] [place], [place] - [address] [place] [address] Phone: [phone] Fax: [phone]    Your procedure is scheduled on Wednesday, April 05, 2017.  Report to [facility] Admitting at 0900 A.M.  Call this number if you have problems the morning of surgery:  [phone]   Remember:  Do not eat food or drink liquids after midnight.  Take these medicines the morning of surgery with A SIP OF WATER: Allopurinol (Zyloprim), Colchicine, Cyclobenzaprine (Flexeril) - if needed, escitalopram (Lexapro), famotodine (pepcid), Hydrocodone-acetaminophen (Norco) - if needed.  7 days prior to surgery STOP taking any Aspirin, Aleve, Naproxen, Ibuprofen, Motrin, Advil, Goody's, BC's, all herbal medications, fish oil, and all vitamins, including your Meloxicam (Mobic), indomethacin (Indocin), Krill Oil.  Continue taking your potassium while taking your Lisinopril-hydrochlorothiazide (Prinzide, Zestoretic)   How to Manage Your Diabetes Before and After Surgery  Why is it important to control my blood sugar before and after surgery? . Improving blood sugar levels before and after surgery helps healing and can limit problems. . A way of improving blood sugar control is eating a healthy diet by: o  Eating less sugar and carbohydrates o  Increasing activity/exercise o  Talking with your doctor about reaching your blood sugar goals . High blood sugars (greater than 180 mg/dL) can raise your risk of infections and slow your recovery, so you will need to focus on controlling your diabetes during the weeks before surgery. . Make sure that the doctor who takes care of your diabetes knows about your planned surgery including the date and location.  How do I manage my blood sugar before surgery? . Check your blood sugar at least 4 times a day, starting 2 days before surgery, to make sure that the level is not  too high or low. o Check your blood sugar the morning of your surgery when you wake up and every 2 hours until you get to the Short Stay unit. . If your blood sugar is less than 70 mg/dL, you will need to treat for low blood sugar: o Do not take insulin. o Treat a low blood sugar (less than 70 mg/dL) with  cup of clear juice (cranberry or apple), 4 glucose tablets, OR glucose gel. o Recheck blood sugar in 15 minutes after treatment (to make sure it is greater than 70 mg/dL). If your blood sugar is not greater than 70 mg/dL on recheck, call [phone] for further instructions. . Report your blood sugar to the short stay nurse when you get to Short Stay.  . If you are admitted to the hospital after surgery: o Your blood sugar will be checked by the staff and you will probably be given insulin after surgery (instead of oral diabetes medicines) to make sure you have good blood sugar levels. o The goal for blood sugar control after surgery is 80-180 mg/dL.   WHAT DO I DO ABOUT MY DIABETES MEDICATION?   Marland Kitchen Do not take oral diabetes medicines (pills) the morning of surgery.      Do not wear jewelry, make-up or nail polish.  Do not wear lotions, powders, or perfumes, or deoderant.  Do not shave 48 hours prior to surgery.  Men may shave face and neck.  Do not bring valuables to the hospital.  [facility] is not responsible for any belongings or valuables.  Eyeglasses, contacts, dentures or bridgework may not  be worn into surgery.  Leave your suitcase in the car.  After surgery it may be brought to your room.  For patients admitted to the hospital, discharge time will be determined by your treatment team.  Patients discharged the day of surgery will not be allowed to drive home.   Name and phone number of your driver:    Special instructions:    [facility]- Preparing For Surgery  Before surgery, you can play an important role. Because skin is not sterile, your skin needs to be as free  of germs as possible. You can reduce the number of germs on your skin by washing with CHG (chlorahexidine gluconate) Soap before surgery.  CHG is an antiseptic cleaner which kills germs and bonds with the skin to continue killing germs even after washing.  Please do not use if you have an allergy to CHG or antibacterial soaps. If your skin becomes reddened/irritated stop using the CHG.  Do not shave (including legs and underarms) for at least 48 hours prior to first CHG shower. It is OK to shave your face.  Please follow these instructions carefully.   1. Shower the NIGHT BEFORE SURGERY and the MORNING OF SURGERY with CHG.   2. If you chose to wash your hair, wash your hair first as usual with your normal shampoo.  3. After you shampoo, rinse your hair and body thoroughly to remove the shampoo.  4. Use CHG as you would any other liquid soap. You can apply CHG directly to the skin and wash gently with a scrungie or a clean washcloth.   5. Apply the CHG Soap to your body ONLY FROM THE NECK DOWN.  Do not use on open wounds or open sores. Avoid contact with your eyes, ears, mouth and genitals (private parts). Wash genitals (private parts) with your normal soap.  6. Wash thoroughly, paying special attention to the area where your surgery will be performed.  7. Thoroughly rinse your body with warm water from the neck down.  8. DO NOT shower/wash with your normal soap after using and rinsing off the CHG Soap.  9. Pat yourself dry with a CLEAN TOWEL.   10. Wear CLEAN PAJAMAS   11. Place CLEAN SHEETS on your bed the night of your first shower and DO NOT SLEEP WITH PETS.    Day of Surgery: Do not apply any deodorants/lotions. Please wear clean clothes to the hospital/surgery center.      Please read over the following fact sheets that you were given.

## 2017-03-28 ENCOUNTER — Encounter (HOSPITAL_COMMUNITY): Payer: Self-pay

## 2017-03-28 ENCOUNTER — Encounter (HOSPITAL_COMMUNITY)
Admission: RE | Admit: 2017-03-28 | Discharge: 2017-03-28 | Disposition: A | Discharge status: Home / Self Care | Payer: Worker's Compensation | Source: Ambulatory Visit | Attending: Neurosurgery | Admitting: Neurosurgery

## 2017-03-28 DIAGNOSIS — E119 Type 2 diabetes mellitus without complications: Secondary | ICD-10-CM | POA: Insufficient documentation

## 2017-03-28 DIAGNOSIS — Z01818 Encounter for other preprocedural examination: Secondary | ICD-10-CM | POA: Insufficient documentation

## 2017-03-28 HISTORY — DX: Personal history of urinary calculi: Z87.442

## 2017-03-28 HISTORY — DX: Depression, unspecified: F32.A

## 2017-03-28 HISTORY — DX: Major depressive disorder, single episode, unspecified: F32.9

## 2017-03-28 HISTORY — DX: Unspecified osteoarthritis, unspecified site: M19.90

## 2017-03-28 LAB — CBC
HCT: 40.5 % (ref 39.0–52.0)
Hemoglobin: 13.8 g/dL (ref 13.0–17.0)
MCH: 31.6 pg (ref 26.0–34.0)
MCHC: 34.1 g/dL (ref 30.0–36.0)
MCV: 92.7 fL (ref 78.0–100.0)
PLATELETS: 242 10*3/uL (ref 150–400)
RBC: 4.37 MIL/uL (ref 4.22–5.81)
RDW: 12.5 % (ref 11.5–15.5)
WBC: 7.5 10*3/uL (ref 4.0–10.5)

## 2017-03-28 LAB — BASIC METABOLIC PANEL
ANION GAP: 10 (ref 5–15)
BUN: 17 mg/dL (ref 6–20)
CO2: 25 mmol/L (ref 22–32)
CREATININE: 1.05 mg/dL (ref 0.61–1.24)
Calcium: 9.3 mg/dL (ref 8.9–10.3)
Chloride: 102 mmol/L (ref 101–111)
GFR calc non Af Amer: >60 mL/min (ref >60)
Glucose, Bld: 121 mg/dL — ABNORMAL HIGH (ref 65–99)
Potassium: 4.6 mmol/L (ref 3.5–5.1)
Sodium: 137 mmol/L (ref 135–145)

## 2017-03-28 LAB — TYPE AND SCREEN
ABO/RH(D): A POS
ANTIBODY SCREEN: NEGATIVE

## 2017-03-28 LAB — SURGICAL PCR SCREEN
MRSA, PCR: NEGATIVE
Staphylococcus aureus: NEGATIVE

## 2017-03-28 LAB — GLUCOSE, CAPILLARY: GLUCOSE-CAPILLARY: 141 mg/dL — AB (ref 65–99)

## 2017-03-29 LAB — HEMOGLOBIN A1C
Hgb A1c MFr Bld: 6.2 % — ABNORMAL HIGH (ref 4.8–5.6)
Mean Plasma Glucose: 131 mg/dL

## 2017-03-30 NOTE — Progress Notes (Addendum)
Anesthesia chart review: Patient is a 56 year old male scheduled for posterior lumbar interbody fusion L2-4 on 04/05/2017 by Dr. Wynetta Emeryram.  History includes former smoker (quit '84), CAD s/p Cypher DES LAD stent 05/30/03, HTN, HLD, DM2, anxiety, depression, GERD, PUD, chronic back pain, gout, nephrolithiasis, lumbar fusion 09/01/03, L4-5 decompression/removal L5-S1 hardware 04/20/12, tonsillectomy. BMI 36, consistent with obesity.   For anesthesia history, he developed 20-30 seconds of asystole during induction for lumbar fusion on 04/29/03. He was on atenolol at the time.  Workup then showed normal LV function and LVH by echocardiogram and mild anterior wall ischemia by Cardiolite. He subsequently underwent cardiac catheterization showing an 80% proximal LAD lesion treated with a Cypher stent on 05/30/03.  He ultimately had the lumbar fusion on 09/01/03, but had a temporary pacing wire placed.  - PCP is Dr. Georgann HousekeeperKarrar Husain. - Cardiologist is Dr. Thurmon FairMihai Croitoru, last visit 10/10/13 with one year follow-up recommended. In regards to patient's CAD he wrote, "He is asymptomatic. We really don't know what his angina might manifest as. His initial diagnosis was secondary to an arrhythmia followed by a nuclear stress test that showed ischemia. He had a normal assessment in 2011. The focuses on risk factor reduction."  Meds include allopurinol, aspirin 81 mg (to hold), colchicine, Flexeril, Lexapro, Pepcid, glipizide, Norco, krill oil, lisinopril-HCTZ, metformin, potassium, Zocor, Januvia.  BP 137/87   Pulse (!) 118   Temp 37.1 C   Resp 20   Ht 5\' 8"  (1.727 m)   Wt 237 lb 14.4 oz (107.9 kg)   SpO2 99%   BMI 36.17 kg/m  (HR 96 bpm with 03/28/17 EKG.)  EKG 03/28/17: NSR.  According to 11/28/09 discharge summary, cardiac catheterization on 11/27/2009 showed normal left main, LAD stent widely patent, left circumflex was a nondominant vessel without visible obstruction, RCA large dominant vessel that generates a  large PDA and bifurcating posterior lateral artery without meaningful obstruction, LV size was normal with ejection fraction of 60-65%.  Last echo 04/30/03: Impression: 1. Mildly dilated aortic root (4.38 cm) 2. Mild left atrial enlargement. 3. Moderate concentric LVH. Normal left ventricular size and systolic function. No regional wall motion abnormalities noted. 4. Trivial mitral and tricuspid regurgitation. Mild mitral annular calcification.  Preoperative labs noted. A1c 6.2.  Patient with known CAD. History of asystole (while on b-blocker) after induction in 2004. Diagnosed with 1V CAD shorting there after s/p LAD stent. Has had subsequent surgeries without known anesthesia complications; however, he has not seen cardiology in over three years. Discussed with anesthesiologist Dr. Maple HudsonMoser. Recommend preoperative cardiology evaluation. I will notify Dr. Lonie Peakram's office.  Velna Ochsllison Lander Eslick, PA-C [facility]iology Phone [phone] 03/30/2017 8:12 PM  Addendum: Patient was seen by Dr. Royann Shiversroitoru on 04/03/17. He wrote (see Letters tab):  "Kathi Simpersimothy M Kreitz is at low risk, from a cardiac standpoint, for the upcoming procedure.  The following medications can be held 5 days prior to the procedure:  Aspirin."  Velna Ochsllison Shadae Reino, PA-C [facility]est[facility]iology Phone [phone] 04/04/2017 1:31 PM

## 2017-03-31 ENCOUNTER — Telehealth: Payer: Self-pay | Admitting: Cardiovascular Disease

## 2017-04-03 ENCOUNTER — Encounter: Payer: Self-pay | Admitting: Cardiovascular Disease

## 2017-04-03 ENCOUNTER — Ambulatory Visit (INDEPENDENT_AMBULATORY_CARE_PROVIDER_SITE_OTHER): Payer: BC Managed Care – PPO | Admitting: Cardiovascular Disease

## 2017-04-03 VITALS — BP 130/82 | HR 118 | Ht 67.0 in | Wt 237.0 lb

## 2017-04-03 DIAGNOSIS — Z01810 Encounter for preprocedural cardiovascular examination: Secondary | ICD-10-CM | POA: Diagnosis not present

## 2017-04-03 DIAGNOSIS — E782 Mixed hyperlipidemia: Secondary | ICD-10-CM | POA: Diagnosis not present

## 2017-04-03 DIAGNOSIS — I2510 Atherosclerotic heart disease of native coronary artery without angina pectoris: Secondary | ICD-10-CM

## 2017-04-03 DIAGNOSIS — E118 Type 2 diabetes mellitus with unspecified complications: Secondary | ICD-10-CM | POA: Diagnosis not present

## 2017-04-03 DIAGNOSIS — Z6835 Body mass index (BMI) 35.0-35.9, adult: Secondary | ICD-10-CM

## 2017-04-03 DIAGNOSIS — I10 Essential (primary) hypertension: Secondary | ICD-10-CM | POA: Diagnosis not present

## 2017-04-03 NOTE — Progress Notes (Signed)
Cardiology Consultation Note:    Date:  04/04/2017   ID:  Devon Wood, DOB 06/06/1961, MRN [id number]  PCP:  Georgann HousekeeperHusain, Karrar, MD  Cardiologist:  Thurmon FairMihai Katherina Wimer, MD    Referring MD: Georgann HousekeeperHusain, Karrar, MD   Chief Complaint  Patient presents with  . New Patient (Initial Visit)    surgical clearance for back surgery   Devon Wood is a 56 y.o. male who is being seen today for the evaluation of perioperative cardiovascular risk at the request of Donalee CitrinGary Cram, MD.  History of Present Illness:    Devon Wood is a 56 y.o. male with a hx of Remote placement of a stent in the LAD artery in 2004 by Dr. Jenne CampusMcQueen. He has not had any cardiac events since. Repeat cardiac catheterization in 2011 showed a widely patent stent. He has preserved left ventricular systolic function. A remote echo reported a dilated ascending aorta, but this was not confirmed by chest CT angiography.  He has not had any angina since his initial revascularization procedure 14 years ago. Denies exertional dyspnea. Physical activity has been limited for the last several months, following a spine injury complicated by L3 radiculopathy. He will require lumbar spine surgery. Prior to his back injury he worked as a Arboriculturistcustodian in [employer] and had no difficulty performing fairly intense physical exertion. He also denies syncope, dizziness, palpitations, leg edema, intermittent claudication, focal neurological complaints. He is obese but does not have signs or symptoms of sleep apnea. He has diabetes mellitus and fold with oral antidiabetics (good control, hemoglobin A1c 6.2%) and takes a statin for hyperlipidemia. He has well-controlled systemic hypertension. He has had gout and takes allopurinol and colchicine if needed.  His aspirin and meloxicam have been stopped in anticipation of surgery.  He has had mild resting tachycardia related to pain. His electrocardiogram performed on July 24 was normal.  Past Medical History:  Diagnosis  Date  . Anxiety   . Arthritis   . Bronchitis    hx of;last time couple of yrs ago  . Bronchitis    hx of  . CAD (coronary artery disease) 10/10/2013   2004 2.0 x 25 mm drug-eluting cypher stent to LAD   . Chronic back pain    L3 buldging disc;DDD  . Complication of anesthesia    20-30 second asystole episode during induction  04/2003,  was on b-blocker tx at the time, found to have 80% LAD lesion s/p stent (but unclear if CAD was the direct cause)   . Coronary artery disease 2004   stent placement, [facility]  . Depression   . Diabetes mellitus    takes Glipizide and Metformin daily  . DM type 2 causing complication (HCC) 10/10/2013  . GERD (gastroesophageal reflux disease)    takes Pepcid daily  . Gout    hx of  . History of kidney stones   . HTN (hypertension) 10/10/2013  . Hypercholesteremia    takes Simvastatin daily  . Hypertension    takes Lisinopril daily  . Joint pain   . Kidney stones    "active ones at present"on the left side  . Mixed hyperlipidemia 10/10/2013  . Morbid obesity (HCC) 10/10/2013  . Nocturia   . Peptic ulcer     Past Surgical History:  Procedure Laterality Date  . BACK SURGERY    . CARDIAC CATHETERIZATION  11/27/2009   widely patent stent  . COLONOSCOPY WITH PROPOFOL N/A 03/17/2015   Procedure:  COLONOSCOPY WITH PROPOFOL;  Surgeon: Charolett Bumpers, MD;  Location: [facility];  Service: Endoscopy;  Laterality: N/A;  . CORONARY STENT PLACEMENT  05/30/2003   proximal LAD  . ESOPHAGOGASTRODUODENOSCOPY  in the 90's  . HAND SURGERY  1980   right  . LUMBAR DISC SURGERY  2002   L5  . LUMBAR FUSION  2004  . NM MYOCAR PERF WALL MOTION  05/19/2004   no evidence of ischemia  . TONSILLECTOMY  1969  . US ECHOCARDIOGRAPHY  04/30/2003   mildly dilated aortic root,mild LA enlargement,mild concentric LVH,trivial mitral and tricuspid regurg.,mild mitral annular ca+    Current Medications: Current Meds  Medication Sig  . allopurinol  (ZYLOPRIM) 100 MG tablet Take 200 mg by mouth daily.    Marland Kitchen aspirin 81 MG tablet Take 81 mg by mouth daily.  . colchicine 0.6 MG tablet Take 0.6 mg by mouth daily as needed. For gout flares   . cyclobenzaprine (FLEXERIL) 10 MG tablet Take 10 mg by mouth 3 (three) times daily as needed for muscle spasms.  Marland Kitchen escitalopram (LEXAPRO) 10 MG tablet Take 10 mg by mouth daily.  . famotidine (PEPCID) 20 MG tablet Take 20 mg by mouth daily.   Marland Kitchen glipiZIDE (GLUCOTROL) 5 MG tablet Take 5 mg by mouth 2 (two) times daily before a meal.   . HYDROcodone-acetaminophen (NORCO) 7.5-325 MG tablet Take 1 tablet by mouth every 4 (four) hours as needed for moderate pain.  . indomethacin (INDOCIN) 25 MG capsule Take 25 mg by mouth 2 (two) times daily as needed. For gout flares  . Krill Oil 300 MG CAPS Take 1 capsule by mouth daily.  Marland Kitchen lisinopril-hydrochlorothiazide (PRINZIDE,ZESTORETIC) 20-12.5 MG per tablet Take 1 tablet by mouth daily.  . meloxicam (MOBIC) 15 MG tablet Take 15 mg by mouth daily.  . metFORMIN (GLUCOPHAGE) 1000 MG tablet Take 1,000 mg by mouth daily with breakfast.   . Potassium 99 MG TABS Take 1 tablet by mouth daily.  . simvastatin (ZOCOR) 40 MG tablet Take 20 mg by mouth daily.    . sitaGLIPtin (JANUVIA) 100 MG tablet Take 100 mg by mouth daily.     Allergies:   Erythromycin   Social History   Social History  . Marital status: Married    Spouse name: N/A  . Number of children: N/A  . Years of education: N/A   Social History Main Topics  . Smoking status: Former Smoker    Packs/day: 0.50    Years: 4.00    Types: Cigarettes    Quit date: 1984  . Smokeless tobacco: Current User    Types: Chew  . Alcohol use Yes     Comment: occasionally  . Drug use: No  . Sexual activity: Yes   Other Topics Concern  . None   Social History Narrative  . None     Family History: The patient's family history includes COPD in his brother, brother, and mother; Cancer in his maternal grandfather;  Dementia in his maternal grandmother; Heart attack in his father and paternal grandfather; Heart failure in his paternal grandmother; Hypertension in his sister. There is no history of Anesthesia problems, Hypotension, Malignant hyperthermia, or Pseudochol deficiency. ROS:   Please see the history of present illness.     All other systems reviewed and are negative.  EKGs/Labs/Other Studies Reviewed:    The following studies were reviewed today: Notes from neurosurgery and spine  EKG:  EKG is not ordered today.  The ekg ordered July 24  demonstrates normal sinus rhythm, normal ECG  Recent Labs: 03/28/2017: BUN 17; Creatinine, Ser 1.05; Hemoglobin 13.8; Platelets 242; Potassium 4.6; Sodium 137  Recent Lipid Panel    Component Value Date/Time   CHOL  11/27/2009 1631    110        ATP III CLASSIFICATION:  <200     mg/dL   Desirable  161-096  mg/dL   Borderline High  >=045    mg/dL   High          TRIG 409 (H) 11/27/2009 1631   HDL 31 (L) 11/27/2009 1631   CHOLHDL 3.5 11/27/2009 1631   VLDL 38 11/27/2009 1631   LDLCALC  11/27/2009 1631    41        Total Cholesterol/HDL:CHD Risk Coronary Heart Disease Risk Table                     Men   Women  1/2 Average Risk   3.4   3.3  Average Risk       5.0   4.4  2 X Average Risk   9.6   7.1  3 X Average Risk  23.4   11.0        Use the calculated Patient Ratio above and the CHD Risk Table to determine the patient's CHD Risk.        ATP III CLASSIFICATION (LDL):  <100     mg/dL   Optimal  811-914  mg/dL   Near or Above                    Optimal  130-159  mg/dL   Borderline  782-956  mg/dL   High  >213     mg/dL   Very High    Physical Exam:    VS:  BP 130/82   Pulse (!) 118   Ht 5\' 7"  (1.702 m)   Wt 237 lb (107.5 kg)   BMI 37.12 kg/m     Wt Readings from Last 3 Encounters:  04/03/17 237 lb (107.5 kg)  03/28/17 237 lb 14.4 oz (107.9 kg)  03/17/15 254 lb (115.2 kg)     GEN: Obese,  Well nourished, well developed in no  acute distress HEENT: Normal NECK: No JVD; No carotid bruits LYMPHATICS: No lymphadenopathy CARDIAC: RRR, no murmurs, rubs, gallops RESPIRATORY:  Clear to auscultation without rales, wheezing or rhonchi  ABDOMEN: Soft, non-tender, non-distended MUSCULOSKELETAL:  No edema; No deformity  SKIN: Warm and dry NEUROLOGIC:  Alert and oriented x 3 PSYCHIATRIC:  Normal affect   ASSESSMENT:    1. Coronary artery disease involving native coronary artery of native heart without angina pectoris   2. Type 2 diabetes mellitus with complication, without long-term current use of insulin (HCC)   3. Essential hypertension   4. Mixed hyperlipidemia   5. Severe obesity (BMI 35.0-35.9 with comorbidity) (HCC)   6. Preoperative cardiovascular examination    PLAN:    In order of problems listed above:  1. CAD:  Asymptomatic, no angina in several years despite previously active lifestyle. Normal resting ECG. Risk factors are generally well addressed, with the exception of low HDL cholesterol which is not expected to improve unless he loses substantial weight and is able to exercise. 2. DM: Excellent control. 3. HTN: Well controlled 4. HLP: On statin with LDL in target range, low HDL will not improve without weight loss and exercise 5. Obesity: Discussed ways to lose weight via caloric restriction. A  lot of his weight gain has occurred since he has been physically limited by back pain and would hopefully reverse with exercise. 6. Low risk of major cardiovascular complications with planned lumbar spine surgery.   Medication Adjustments/Labs and Tests Ordered: Current medicines are reviewed at length with the patient today.  Concerns regarding medicines are outlined above.  No orders of the defined types were placed in this encounter.  No orders of the defined types were placed in this encounter.   Signed, Thurmon FairMihai Jaiden Wahab, MD  04/04/2017 12:42 PM    [facility]

## 2017-04-03 NOTE — Patient Instructions (Signed)
Dr Croitoru recommends that you schedule a follow-up appointment in 12 months. You will receive a reminder letter in the mail two months in advance. If you don't receive a letter, please call our office to schedule the follow-up appointment.  If you need a refill on your cardiac medications before your next appointment, please call your pharmacy. 

## 2017-04-04 ENCOUNTER — Telehealth: Payer: Self-pay | Admitting: *Deleted

## 2017-04-04 ENCOUNTER — Encounter: Payer: Self-pay | Admitting: Cardiovascular Disease

## 2017-04-04 DIAGNOSIS — Z6835 Body mass index (BMI) 35.0-35.9, adult: Secondary | ICD-10-CM

## 2017-04-04 NOTE — Telephone Encounter (Signed)
Requesting surgical clearance:  1. Type of surgery: L2-3, L3-4 Lumbar Fusion   2. Surgeon: Dr. Donalee CitrinGary Cram  3.Surgical Date:04/05/17  4. Medications that need to be held: ASA   5. CAD: Yes  6. I will defer to:  Dr. Suzzette Righterroitoru   Contact Information: Fax # [phone]

## 2017-04-04 NOTE — Telephone Encounter (Signed)
[facility] notified note faxed, and per  OV note: Low risk of major cardiovascular complications with planned lumbar spine surgery.

## 2017-04-04 NOTE — Telephone Encounter (Signed)
New Message     Checking on surgical clearance the surgeon said they have not received it back for it surgery tomorrow.

## 2017-04-04 NOTE — Telephone Encounter (Signed)
Epic'd letter. Please make sure they received it MCr

## 2017-04-05 ENCOUNTER — Encounter (HOSPITAL_COMMUNITY): Payer: Self-pay | Admitting: Orthopedic Surgery

## 2017-04-05 ENCOUNTER — Inpatient Hospital Stay (HOSPITAL_COMMUNITY)
Admission: RE | Disposition: A | Discharge status: Home / Self Care | Payer: Self-pay | Source: Ambulatory Visit | Attending: Neurosurgery

## 2017-04-05 ENCOUNTER — Inpatient Hospital Stay (HOSPITAL_COMMUNITY): Payer: Worker's Compensation | Admitting: Vascular Surgery

## 2017-04-05 ENCOUNTER — Inpatient Hospital Stay (HOSPITAL_COMMUNITY): Payer: Worker's Compensation

## 2017-04-05 ENCOUNTER — Inpatient Hospital Stay (HOSPITAL_COMMUNITY)
Admission: RE | Admit: 2017-04-05 | Discharge: 2017-04-07 | DRG: 460 | Disposition: A | Discharge status: Home / Self Care | Payer: Worker's Compensation | Source: Ambulatory Visit | Attending: Neurosurgery | Admitting: Neurosurgery

## 2017-04-05 ENCOUNTER — Inpatient Hospital Stay (HOSPITAL_COMMUNITY): Payer: Worker's Compensation | Admitting: Certified Registered"

## 2017-04-05 DIAGNOSIS — M479 Spondylosis, unspecified: Secondary | ICD-10-CM | POA: Diagnosis present

## 2017-04-05 DIAGNOSIS — M4800 Spinal stenosis, site unspecified: Secondary | ICD-10-CM | POA: Diagnosis present

## 2017-04-05 DIAGNOSIS — E119 Type 2 diabetes mellitus without complications: Secondary | ICD-10-CM | POA: Diagnosis present

## 2017-04-05 DIAGNOSIS — Z87891 Personal history of nicotine dependence: Secondary | ICD-10-CM

## 2017-04-05 DIAGNOSIS — I2510 Atherosclerotic heart disease of native coronary artery without angina pectoris: Secondary | ICD-10-CM | POA: Diagnosis present

## 2017-04-05 DIAGNOSIS — Z8249 Family history of ischemic heart disease and other diseases of the circulatory system: Secondary | ICD-10-CM | POA: Diagnosis not present

## 2017-04-05 DIAGNOSIS — M5126 Other intervertebral disc displacement, lumbar region: Secondary | ICD-10-CM | POA: Diagnosis present

## 2017-04-05 DIAGNOSIS — Z981 Arthrodesis status: Secondary | ICD-10-CM | POA: Diagnosis not present

## 2017-04-05 DIAGNOSIS — Z7982 Long term (current) use of aspirin: Secondary | ICD-10-CM | POA: Diagnosis not present

## 2017-04-05 DIAGNOSIS — E782 Mixed hyperlipidemia: Secondary | ICD-10-CM | POA: Diagnosis present

## 2017-04-05 DIAGNOSIS — Z8674 Personal history of sudden cardiac arrest: Secondary | ICD-10-CM | POA: Diagnosis not present

## 2017-04-05 DIAGNOSIS — M5136 Other intervertebral disc degeneration, lumbar region: Secondary | ICD-10-CM | POA: Diagnosis present

## 2017-04-05 DIAGNOSIS — I10 Essential (primary) hypertension: Secondary | ICD-10-CM | POA: Diagnosis present

## 2017-04-05 DIAGNOSIS — G8929 Other chronic pain: Secondary | ICD-10-CM | POA: Diagnosis present

## 2017-04-05 DIAGNOSIS — M48061 Spinal stenosis, lumbar region without neurogenic claudication: Secondary | ICD-10-CM | POA: Diagnosis present

## 2017-04-05 DIAGNOSIS — K219 Gastro-esophageal reflux disease without esophagitis: Secondary | ICD-10-CM | POA: Diagnosis present

## 2017-04-05 DIAGNOSIS — Z881 Allergy status to other antibiotic agents status: Secondary | ICD-10-CM | POA: Diagnosis not present

## 2017-04-05 DIAGNOSIS — Z7984 Long term (current) use of oral hypoglycemic drugs: Secondary | ICD-10-CM

## 2017-04-05 DIAGNOSIS — Z955 Presence of coronary angioplasty implant and graft: Secondary | ICD-10-CM | POA: Diagnosis not present

## 2017-04-05 DIAGNOSIS — Z419 Encounter for procedure for purposes other than remedying health state, unspecified: Secondary | ICD-10-CM

## 2017-04-05 LAB — GLUCOSE, CAPILLARY
GLUCOSE-CAPILLARY: 242 mg/dL — AB (ref 65–99)
Glucose-Capillary: 208 mg/dL — ABNORMAL HIGH (ref 65–99)
Glucose-Capillary: 361 mg/dL — ABNORMAL HIGH (ref 65–99)

## 2017-04-05 SURGERY — POSTERIOR LUMBAR FUSION 2 LEVEL
Anesthesia: General | Site: Back

## 2017-04-05 MED ORDER — PHENOL 1.4 % MT LIQD: 1.0000 | OROMUCOSAL | Status: DC | PRN

## 2017-04-05 MED ORDER — CEFAZOLIN SODIUM-DEXTROSE 2-4 GM/100ML-% IV SOLN
INTRAVENOUS | Status: AC
  Filled 2017-04-05: qty 100

## 2017-04-05 MED ORDER — MIDAZOLAM HCL 2 MG/2ML IJ SOLN
INTRAMUSCULAR | Status: DC | PRN
  Administered 2017-04-05: 2 mg via INTRAVENOUS

## 2017-04-05 MED ORDER — THROMBIN 20000 UNITS EX SOLR
CUTANEOUS | Status: AC
  Filled 2017-04-05: qty 20000

## 2017-04-05 MED ORDER — CHLORHEXIDINE GLUCONATE CLOTH 2 % EX PADS: 6.0000 | MEDICATED_PAD | Freq: Once | CUTANEOUS | Status: DC

## 2017-04-05 MED ORDER — INSULIN ASPART 100 UNIT/ML ~~LOC~~ SOLN
0.0000 [IU] | Freq: Three times a day (TID) | SUBCUTANEOUS | Status: DC
  Administered 2017-04-06: 7 [IU] via SUBCUTANEOUS
  Administered 2017-04-06: 4 [IU] via SUBCUTANEOUS
  Administered 2017-04-06 – 2017-04-07 (×2): 7 [IU] via SUBCUTANEOUS

## 2017-04-05 MED ORDER — LIDOCAINE-EPINEPHRINE 1 %-1:100000 IJ SOLN
INTRAMUSCULAR | Status: DC | PRN
  Administered 2017-04-05: 10 mL

## 2017-04-05 MED ORDER — DEXAMETHASONE SODIUM PHOSPHATE 10 MG/ML IJ SOLN: 10.0000 mg | INTRAMUSCULAR | Status: DC

## 2017-04-05 MED ORDER — ROCURONIUM BROMIDE 10 MG/ML (PF) SYRINGE
PREFILLED_SYRINGE | INTRAVENOUS | Status: AC
  Filled 2017-04-05: qty 5

## 2017-04-05 MED ORDER — 0.9 % SODIUM CHLORIDE (POUR BTL) OPTIME
TOPICAL | Status: DC | PRN
  Administered 2017-04-05: 1000 mL

## 2017-04-05 MED ORDER — LIDOCAINE-EPINEPHRINE 1 %-1:100000 IJ SOLN
INTRAMUSCULAR | Status: AC
  Filled 2017-04-05: qty 1

## 2017-04-05 MED ORDER — PHENYLEPHRINE HCL 10 MG/ML IJ SOLN
INTRAVENOUS | Status: DC | PRN
  Administered 2017-04-05: 40 ug/min via INTRAVENOUS

## 2017-04-05 MED ORDER — VANCOMYCIN HCL 1000 MG IV SOLR
INTRAVENOUS | Status: AC
  Filled 2017-04-05: qty 1000

## 2017-04-05 MED ORDER — ROCURONIUM BROMIDE 10 MG/ML (PF) SYRINGE
PREFILLED_SYRINGE | INTRAVENOUS | Status: DC | PRN
  Administered 2017-04-05 (×6): 10 mg via INTRAVENOUS
  Administered 2017-04-05 (×3): 50 mg via INTRAVENOUS

## 2017-04-05 MED ORDER — ALBUMIN HUMAN 5 % IV SOLN
INTRAVENOUS | Status: DC | PRN
  Administered 2017-04-05: 13:00:00 via INTRAVENOUS

## 2017-04-05 MED ORDER — FENTANYL CITRATE (PF) 250 MCG/5ML IJ SOLN
INTRAMUSCULAR | Status: AC
  Filled 2017-04-05: qty 5

## 2017-04-05 MED ORDER — SUGAMMADEX SODIUM 200 MG/2ML IV SOLN
INTRAVENOUS | Status: DC | PRN
  Administered 2017-04-05: 300 mg via INTRAVENOUS

## 2017-04-05 MED ORDER — SURGIFOAM 100 EX MISC
CUTANEOUS | Status: DC | PRN
  Administered 2017-04-05: 20 mL via TOPICAL

## 2017-04-05 MED ORDER — ONDANSETRON HCL 4 MG PO TABS: 4.0000 mg | ORAL_TABLET | Freq: Four times a day (QID) | ORAL | Status: DC | PRN

## 2017-04-05 MED ORDER — HYDROMORPHONE HCL 1 MG/ML IJ SOLN
INTRAMUSCULAR | Status: AC
  Filled 2017-04-05: qty 1

## 2017-04-05 MED ORDER — HYDROCODONE-ACETAMINOPHEN 5-325 MG PO TABS
1.0000 | ORAL_TABLET | ORAL | Status: DC | PRN
  Administered 2017-04-05 – 2017-04-07 (×8): 2 via ORAL
  Filled 2017-04-05 (×9): qty 2

## 2017-04-05 MED ORDER — ARTIFICIAL TEARS OPHTHALMIC OINT
TOPICAL_OINTMENT | OPHTHALMIC | Status: DC | PRN
Start: 2017-04-05 — End: 2017-04-05
  Administered 2017-04-05: 1 via OPHTHALMIC

## 2017-04-05 MED ORDER — PROPOFOL 10 MG/ML IV BOLUS
INTRAVENOUS | Status: DC | PRN
  Administered 2017-04-05: 160 mg via INTRAVENOUS

## 2017-04-05 MED ORDER — VANCOMYCIN HCL 1000 MG IV SOLR
INTRAVENOUS | Status: DC | PRN
  Administered 2017-04-05: 1000 mg via TOPICAL

## 2017-04-05 MED ORDER — MEPERIDINE HCL 25 MG/ML IJ SOLN: 6.2500 mg | INTRAMUSCULAR | Status: DC | PRN

## 2017-04-05 MED ORDER — THROMBIN 5000 UNITS EX SOLR
CUTANEOUS | Status: AC
  Filled 2017-04-05: qty 5000

## 2017-04-05 MED ORDER — SENNA 8.6 MG PO TABS
1.0000 | ORAL_TABLET | Freq: Two times a day (BID) | ORAL | Status: DC
  Administered 2017-04-05 – 2017-04-07 (×4): 8.6 mg via ORAL
  Filled 2017-04-05 (×4): qty 1

## 2017-04-05 MED ORDER — PROPOFOL 10 MG/ML IV BOLUS
INTRAVENOUS | Status: AC
  Filled 2017-04-05: qty 20

## 2017-04-05 MED ORDER — SUGAMMADEX SODIUM 200 MG/2ML IV SOLN
INTRAVENOUS | Status: AC
  Filled 2017-04-05: qty 2

## 2017-04-05 MED ORDER — SODIUM CHLORIDE 0.9 % IV SOLN: 250.0000 mL | INTRAVENOUS | Status: DC

## 2017-04-05 MED ORDER — SODIUM CHLORIDE 0.9% FLUSH: 3.0000 mL | Freq: Two times a day (BID) | INTRAVENOUS | Status: DC

## 2017-04-05 MED ORDER — ONDANSETRON HCL 4 MG/2ML IJ SOLN
INTRAMUSCULAR | Status: AC
  Filled 2017-04-05: qty 2

## 2017-04-05 MED ORDER — SUCCINYLCHOLINE CHLORIDE 200 MG/10ML IV SOSY
PREFILLED_SYRINGE | INTRAVENOUS | Status: DC | PRN
  Administered 2017-04-05: 120 mg via INTRAVENOUS

## 2017-04-05 MED ORDER — HYDROMORPHONE HCL 1 MG/ML IJ SOLN
0.2500 mg | INTRAMUSCULAR | Status: DC | PRN
  Administered 2017-04-05 (×4): 0.5 mg via INTRAVENOUS

## 2017-04-05 MED ORDER — MIDAZOLAM HCL 2 MG/2ML IJ SOLN
INTRAMUSCULAR | Status: AC
  Filled 2017-04-05: qty 2

## 2017-04-05 MED ORDER — METFORMIN HCL 500 MG PO TABS
1000.0000 mg | ORAL_TABLET | Freq: Once | ORAL | Status: AC
  Administered 2017-04-05: 1000 mg via ORAL
  Filled 2017-04-05: qty 2

## 2017-04-05 MED ORDER — PHENYLEPHRINE 40 MCG/ML (10ML) SYRINGE FOR IV PUSH (FOR BLOOD PRESSURE SUPPORT)
PREFILLED_SYRINGE | INTRAVENOUS | Status: DC | PRN
  Administered 2017-04-05 (×2): 80 ug via INTRAVENOUS

## 2017-04-05 MED ORDER — CEFAZOLIN SODIUM-DEXTROSE 2-4 GM/100ML-% IV SOLN
2.0000 g | Freq: Three times a day (TID) | INTRAVENOUS | Status: AC
  Administered 2017-04-05 – 2017-04-06 (×2): 2 g via INTRAVENOUS
  Filled 2017-04-05 (×2): qty 100

## 2017-04-05 MED ORDER — LACTATED RINGERS IV SOLN
INTRAVENOUS | Status: DC
  Administered 2017-04-05 – 2017-04-06 (×2): via INTRAVENOUS

## 2017-04-05 MED ORDER — SODIUM CHLORIDE 0.9 % IV SOLN: INTRAVENOUS | Status: DC

## 2017-04-05 MED ORDER — LIDOCAINE 2% (20 MG/ML) 5 ML SYRINGE
INTRAMUSCULAR | Status: AC
  Filled 2017-04-05: qty 5

## 2017-04-05 MED ORDER — CEFAZOLIN SODIUM-DEXTROSE 2-4 GM/100ML-% IV SOLN
2.0000 g | INTRAVENOUS | Status: AC
  Administered 2017-04-05 (×2): 2 g via INTRAVENOUS

## 2017-04-05 MED ORDER — ACETAMINOPHEN 650 MG RE SUPP: 650.0000 mg | RECTAL | Status: DC | PRN

## 2017-04-05 MED ORDER — THROMBIN 5000 UNITS EX SOLR
OROMUCOSAL | Status: DC | PRN
  Administered 2017-04-05: 5 mL via TOPICAL

## 2017-04-05 MED ORDER — PROMETHAZINE HCL 25 MG/ML IJ SOLN: 6.2500 mg | INTRAMUSCULAR | Status: DC | PRN

## 2017-04-05 MED ORDER — MENTHOL 3 MG MT LOZG: 1.0000 | LOZENGE | OROMUCOSAL | Status: DC | PRN

## 2017-04-05 MED ORDER — LACTATED RINGERS IV SOLN
INTRAVENOUS | Status: DC | PRN
  Administered 2017-04-05 (×4): via INTRAVENOUS

## 2017-04-05 MED ORDER — SODIUM CHLORIDE 0.9% FLUSH: 3.0000 mL | INTRAVENOUS | Status: DC | PRN

## 2017-04-05 MED ORDER — THROMBIN 20000 UNITS EX SOLR
CUTANEOUS | Status: DC | PRN
  Administered 2017-04-05: 20 mL via TOPICAL

## 2017-04-05 MED ORDER — ONDANSETRON HCL 4 MG/2ML IJ SOLN
INTRAMUSCULAR | Status: DC | PRN
  Administered 2017-04-05: 4 mg via INTRAVENOUS

## 2017-04-05 MED ORDER — FENTANYL CITRATE (PF) 250 MCG/5ML IJ SOLN
INTRAMUSCULAR | Status: DC | PRN
Start: 2017-04-05 — End: 2017-04-05
  Administered 2017-04-05: 50 ug via INTRAVENOUS
  Administered 2017-04-05: 25 ug via INTRAVENOUS
  Administered 2017-04-05: 100 ug via INTRAVENOUS
  Administered 2017-04-05: 25 ug via INTRAVENOUS
  Administered 2017-04-05 (×2): 50 ug via INTRAVENOUS

## 2017-04-05 MED ORDER — ONDANSETRON HCL 4 MG/2ML IJ SOLN: 4.0000 mg | Freq: Four times a day (QID) | INTRAMUSCULAR | Status: DC | PRN

## 2017-04-05 MED ORDER — SODIUM CHLORIDE 0.9 % IR SOLN
Status: DC | PRN
  Administered 2017-04-05: 500 mL

## 2017-04-05 MED ORDER — DOCUSATE SODIUM 100 MG PO CAPS
100.0000 mg | ORAL_CAPSULE | Freq: Two times a day (BID) | ORAL | Status: DC
  Administered 2017-04-05 – 2017-04-07 (×4): 100 mg via ORAL
  Filled 2017-04-05 (×4): qty 1

## 2017-04-05 MED ORDER — CYCLOBENZAPRINE HCL 10 MG PO TABS
10.0000 mg | ORAL_TABLET | Freq: Three times a day (TID) | ORAL | Status: DC | PRN
  Administered 2017-04-06 – 2017-04-07 (×4): 10 mg via ORAL
  Filled 2017-04-05 (×4): qty 1

## 2017-04-05 MED ORDER — DEXAMETHASONE SODIUM PHOSPHATE 10 MG/ML IJ SOLN
INTRAMUSCULAR | Status: DC | PRN
  Administered 2017-04-05: 10 mg via INTRAVENOUS

## 2017-04-05 MED ORDER — LIDOCAINE 2% (20 MG/ML) 5 ML SYRINGE
INTRAMUSCULAR | Status: DC | PRN
  Administered 2017-04-05: 40 mg via INTRAVENOUS

## 2017-04-05 MED ORDER — MORPHINE SULFATE (PF) 4 MG/ML IV SOLN: 2.0000 mg | INTRAVENOUS | Status: DC | PRN

## 2017-04-05 MED ORDER — BUPIVACAINE LIPOSOME 1.3 % IJ SUSP
INTRAMUSCULAR | Status: DC | PRN
  Administered 2017-04-05: 20 mL

## 2017-04-05 MED ORDER — ARTIFICIAL TEARS OPHTHALMIC OINT
TOPICAL_OINTMENT | OPHTHALMIC | Status: AC
  Filled 2017-04-05: qty 3.5

## 2017-04-05 MED ORDER — LACTATED RINGERS IV SOLN: INTRAVENOUS | Status: DC

## 2017-04-05 MED ORDER — SUCCINYLCHOLINE CHLORIDE 200 MG/10ML IV SOSY
PREFILLED_SYRINGE | INTRAVENOUS | Status: AC
  Filled 2017-04-05: qty 10

## 2017-04-05 MED ORDER — DEXAMETHASONE SODIUM PHOSPHATE 10 MG/ML IJ SOLN
INTRAMUSCULAR | Status: AC
  Filled 2017-04-05: qty 1

## 2017-04-05 MED ORDER — BUPIVACAINE LIPOSOME 1.3 % IJ SUSP
20.0000 mL | Freq: Once | INTRAMUSCULAR | Status: DC
  Filled 2017-04-05: qty 20

## 2017-04-05 MED ORDER — PHENYLEPHRINE 40 MCG/ML (10ML) SYRINGE FOR IV PUSH (FOR BLOOD PRESSURE SUPPORT)
PREFILLED_SYRINGE | INTRAVENOUS | Status: AC
  Filled 2017-04-05: qty 10

## 2017-04-05 MED ORDER — ACETAMINOPHEN 325 MG PO TABS: 650.0000 mg | ORAL_TABLET | ORAL | Status: DC | PRN

## 2017-04-05 MED ORDER — CEFAZOLIN SODIUM 1 G IJ SOLR
INTRAMUSCULAR | Status: AC
  Filled 2017-04-05: qty 20

## 2017-04-05 MED FILL — Heparin Sodium (Porcine) Inj 1000 Unit/ML: INTRAMUSCULAR | Qty: 30 | Status: AC

## 2017-04-05 MED FILL — Sodium Chloride IV Soln 0.9%: INTRAVENOUS | Qty: 2000 | Status: AC

## 2017-04-05 SURGICAL SUPPLY — 81 items
ADH SKN CLS APL DERMABOND .7 (GAUZE/BANDAGES/DRESSINGS) ×1
APL SKNCLS STERI-STRIP NONHPOA (GAUZE/BANDAGES/DRESSINGS) ×1
BAG DECANTER FOR FLEXI CONT (MISCELLANEOUS) ×2 IMPLANT
BENZOIN TINCTURE PRP APPL 2/3 (GAUZE/BANDAGES/DRESSINGS) ×2 IMPLANT
BLADE CLIPPER SURG (BLADE) ×1 IMPLANT
BLADE SURG 11 STRL SS (BLADE) ×2 IMPLANT
BUR CUTTER 7.0 ROUND (BURR) ×2 IMPLANT
BUR MATCHSTICK NEURO 3.0 LAGG (BURR) ×2 IMPLANT
CANISTER SUCT 3000ML PPV (MISCELLANEOUS) ×2 IMPLANT
CAP REVERE LOCKING (Cap) ×6 IMPLANT
CARTRIDGE OIL MAESTRO DRILL (MISCELLANEOUS) ×1 IMPLANT
CONN CROSSLINK REV 38-50MM (Connector) ×2 IMPLANT
CONNECTOR CRSLINK REV 38-50MM (Connector) IMPLANT
CONT SPEC 4OZ CLIKSEAL STRL BL (MISCELLANEOUS) ×2 IMPLANT
COVER BACK TABLE 60X90IN (DRAPES) ×2 IMPLANT
DECANTER SPIKE VIAL GLASS SM (MISCELLANEOUS) ×1 IMPLANT
DERMABOND ADVANCED (GAUZE/BANDAGES/DRESSINGS) ×1
DERMABOND ADVANCED .7 DNX12 (GAUZE/BANDAGES/DRESSINGS) ×1 IMPLANT
DIFFUSER DRILL AIR PNEUMATIC (MISCELLANEOUS) ×2 IMPLANT
DRAPE C-ARM 42X72 X-RAY (DRAPES) ×2 IMPLANT
DRAPE C-ARMOR (DRAPES) ×2 IMPLANT
DRAPE HALF SHEET 40X57 (DRAPES) IMPLANT
DRAPE LAPAROTOMY 100X72X124 (DRAPES) ×2 IMPLANT
DRAPE POUCH INSTRU U-SHP 10X18 (DRAPES) ×2 IMPLANT
DRAPE SURG 17X23 STRL (DRAPES) ×2 IMPLANT
DRSG OPSITE 4X5.5 SM (GAUZE/BANDAGES/DRESSINGS) ×1 IMPLANT
DRSG OPSITE POSTOP 4X10 (GAUZE/BANDAGES/DRESSINGS) ×1 IMPLANT
DURAPREP 26ML APPLICATOR (WOUND CARE) ×2 IMPLANT
ELECT REM PT RETURN 9FT ADLT (ELECTROSURGICAL) ×2
ELECTRODE REM PT RTRN 9FT ADLT (ELECTROSURGICAL) ×1 IMPLANT
EVACUATOR 3/16  PVC DRAIN (DRAIN) ×1
EVACUATOR 3/16 PVC DRAIN (DRAIN) ×1 IMPLANT
GAUZE SPONGE 4X4 12PLY STRL (GAUZE/BANDAGES/DRESSINGS) ×1 IMPLANT
GAUZE SPONGE 4X4 12PLY STRL LF (GAUZE/BANDAGES/DRESSINGS) ×1 IMPLANT
GAUZE SPONGE 4X4 16PLY XRAY LF (GAUZE/BANDAGES/DRESSINGS) ×1 IMPLANT
GLOVE BIO SURGEON STRL SZ7 (GLOVE) ×3 IMPLANT
GLOVE BIO SURGEON STRL SZ8 (GLOVE) ×3 IMPLANT
GLOVE BIOGEL PI IND STRL 6.5 (GLOVE) IMPLANT
GLOVE BIOGEL PI IND STRL 7.0 (GLOVE) IMPLANT
GLOVE BIOGEL PI INDICATOR 6.5 (GLOVE) ×2
GLOVE BIOGEL PI INDICATOR 7.0 (GLOVE) ×4
GLOVE ECLIPSE 6.5 STRL STRAW (GLOVE) ×2 IMPLANT
GLOVE EXAM NITRILE LRG STRL (GLOVE) IMPLANT
GLOVE EXAM NITRILE XL STR (GLOVE) IMPLANT
GLOVE EXAM NITRILE XS STR PU (GLOVE) IMPLANT
GLOVE INDICATOR 8.5 STRL (GLOVE) ×3 IMPLANT
GLOVE SURG SS PI 6.5 STRL IVOR (GLOVE) ×2 IMPLANT
GOWN STRL REUS W/ TWL LRG LVL3 (GOWN DISPOSABLE) IMPLANT
GOWN STRL REUS W/ TWL XL LVL3 (GOWN DISPOSABLE) ×2 IMPLANT
GOWN STRL REUS W/TWL 2XL LVL3 (GOWN DISPOSABLE) IMPLANT
GOWN STRL REUS W/TWL LRG LVL3 (GOWN DISPOSABLE) ×12
GOWN STRL REUS W/TWL XL LVL3 (GOWN DISPOSABLE) ×6
KIT BASIN OR (CUSTOM PROCEDURE TRAY) ×2 IMPLANT
KIT ROOM TURNOVER OR (KITS) ×2 IMPLANT
MIX DBX 10CC 35% BONE (Bone Implant) ×1 IMPLANT
NDL HYPO 21X1.5 SAFETY (NEEDLE) ×1 IMPLANT
NDL HYPO 25X1 1.5 SAFETY (NEEDLE) ×1 IMPLANT
NEEDLE HYPO 21X1.5 SAFETY (NEEDLE) ×2 IMPLANT
NEEDLE HYPO 25X1 1.5 SAFETY (NEEDLE) ×2 IMPLANT
NS IRRIG 1000ML POUR BTL (IV SOLUTION) ×2 IMPLANT
OIL CARTRIDGE MAESTRO DRILL (MISCELLANEOUS) ×2
PACK LAMINECTOMY NEURO (CUSTOM PROCEDURE TRAY) ×2 IMPLANT
PAD ARMBOARD 7.5X6 YLW CONV (MISCELLANEOUS) ×7 IMPLANT
PATTIES SURGICAL 1X1 (DISPOSABLE) ×1 IMPLANT
ROD REVERE 6.35 CURVED 85MM (Rod) ×2 IMPLANT
SCREW REVERE 6.35 6.5MMX45 (Screw) ×6 IMPLANT
SCREW SET NON BREAK OFF (Screw) ×2 IMPLANT
SPACER RISE 10X22 8-14MM-10 (Spacer) ×4 IMPLANT
SPONGE LAP 4X18 X RAY DECT (DISPOSABLE) IMPLANT
SPONGE SURGIFOAM ABS GEL 100 (HEMOSTASIS) ×2 IMPLANT
STRIP BIOACTIVE 20CC 25X100X8 (Miscellaneous) ×1 IMPLANT
STRIP CLOSURE SKIN 1/2X4 (GAUZE/BANDAGES/DRESSINGS) ×3 IMPLANT
SUT VIC AB 0 CT1 18XCR BRD8 (SUTURE) ×1 IMPLANT
SUT VIC AB 0 CT1 8-18 (SUTURE) ×4
SUT VIC AB 2-0 CT1 18 (SUTURE) ×3 IMPLANT
SUT VIC AB 4-0 PS2 27 (SUTURE) ×2 IMPLANT
SYR 20CC LL (SYRINGE) ×2 IMPLANT
TOWEL GREEN STERILE (TOWEL DISPOSABLE) ×2 IMPLANT
TOWEL GREEN STERILE FF (TOWEL DISPOSABLE) ×2 IMPLANT
TRAY FOLEY W/METER SILVER 16FR (SET/KITS/TRAYS/PACK) ×2 IMPLANT
WATER STERILE IRR 1000ML POUR (IV SOLUTION) ×2 IMPLANT

## 2017-04-05 NOTE — Op Note (Signed)
Preoperative diagnosis: Herniated nucleus pulposus L3-4 spinal stenosis L3-4. Degenerative disc disease L2-3 L3-4 with spinal stenosis L2-3 as well segment instability L3-4  Postoperative diagnosis: Same  Procedure: #1 decompressive lumbar laminectomies with complete medial facetectomies and radical foraminotomies at L2-3 and L3-4 and excess and requiring more work than would be needed with a standard interbody fusion  #2 posterior lumbar interbody fusion L2-3 L3-4 utilizing the globus rise expandable cage system packed with locally harvested autograft mixed with DBX mix  #3 exploration of fusion removal of hardware L4-5 with removal of bilateral L5 screws when retention of bilateral L4 screws.  #4 pedicle screw fixation L2-L4 utilizing the globus Revere 6.35 pedicle screw system at L2 and L3 and incorporating into a 6.35 Legacy pedicle screw system that was left behind at L4.  #5 posterior lateral arthrodesis L2-L4 utilizing a locally harvested autograft mixed with DBX mix.  Surgeon: Jillyn HiddenGary Tivis Wherry  Asst.: Barbaraann BarthelKyle Cabell  Anesthesia: Gen.  EBL: Minimal  History of present illness: Patient is a 56 year old gentleman previously undergone L4-S1 fusion who did very well for many years then over the last several weeks and months is a progress worsening back and bilateral leg pain. Workup revealed large disc herniation at L3-4 root and above his previous 4-1 fusion with marked spondylosis at that level. In addition he also had progressive degeneration breakdown with stenosis at L2-3. Due to patient's failure of conservative treatment imaging findings and progression of clinical syndrome or recommended decompression stabilization procedure tying into his all construct at L2-3 and L3-4. We extensively reviewed the risks and benefits of the operation with him as well as perioperative course expectations of outcome and alternatives surgery and he understood and agreed to proceed forward.  Operative procedure:  In the or was induced on general anesthesia positioned prone on the Wilson frame back was prepped and draped in routine sterile fashion is old incision was opened up and extended cephalad subperiosteal dissections care lamina of L2 and L3 and then exposed the hardware at L4-5. The old fusion did appear to be solid or disconnect the knots the rods and removed both L5 screws. Then central decompression was begun initially with removal of the spinous processes of L2 and L3. Central decompression was begun complete medial facetectomies were performed at 2334 there was a dense scar tissue extending at L3-4 with marked stenosis and hourglass compression of thecal sac. This is all teased off of the dura and with complete medial facetectomies and aggressive undergoing of both superior tickling facets axis the lateral aspect of disc space was achieved at both levels. After adequate decompression been achieved disc space was incised bilaterally sequentially distracted and left the 9 distractor in place. Expandable cages sized 8-14 mm 22 mm in length were selected and packed with local autograft mixed with DBX mix. Both cages were inserted bilaterally autograft mix packed centrally. After all the interbody body work been done and the cages been expanded the disc space opened up thecal sac was decompressed. Pilot holes were drilled and pedicle screws were placed in routine fashion at L2, L3. Spurs the TPs from L2-L4 after copious irrigation aggressively decorticated TPs lateral facet complexes and packed the remainder the local autograft posterior laterally from L2-L4. In a similar rods anchored the knots in place waist a cross-link expected all the foramina to confirm patency no migration of graft material blade Gelfoam over the dura placed a large Hemovac drains were ankle vancomycin powder in the wound and injected exparel in the fascia.  The wounds and closed with interrupted Vicryl and a running 4 subcuticular Dermabond  benzo and Steri-Strips and sterile dressings applied and patient recovered in stable condition. At the end the case all needle counts and sponge counts were correct.

## 2017-04-05 NOTE — Anesthesia Preprocedure Evaluation (Addendum)
Anesthesia Evaluation  Patient identified by MRN, date of birth, ID band Patient awake    Reviewed: Allergy & Precautions, NPO status , Patient's Chart, lab work & pertinent test results  Airway Mallampati: III  TM Distance: >3 FB Neck ROM: Full    Dental  (+) Teeth Intact, Dental Advisory Given   Pulmonary neg pulmonary ROS, former smoker,    breath sounds clear to auscultation       Cardiovascular hypertension, Pt. on medications + CAD and + Cardiac Stents  negative cardio ROS   Rhythm:Regular Rate:Normal     Neuro/Psych PSYCHIATRIC DISORDERS Anxiety Depression negative neurological ROS     GI/Hepatic Neg liver ROS, PUD, GERD  ,  Endo/Other  negative endocrine ROSdiabetes, Type 2, Oral Hypoglycemic Agents  Renal/GU Renal disease     Musculoskeletal  (+) Arthritis ,   Abdominal   Peds  Hematology negative hematology ROS (+)   Anesthesia Other Findings Day of surgery medications reviewed with the patient.  Reproductive/Obstetrics                            Lab Results  Component Value Date   WBC 7.5 03/28/2017   HGB 13.8 03/28/2017   HCT 40.5 03/28/2017   MCV 92.7 03/28/2017   PLT 242 03/28/2017   Lab Results  Component Value Date   CREATININE 1.05 03/28/2017   BUN 17 03/28/2017   NA 137 03/28/2017   K 4.6 03/28/2017   CL 102 03/28/2017   CO2 25 03/28/2017   Lab Results  Component Value Date   INR 1.00 11/27/2009   EKG: normal sinus rhythm.  Anesthesia Physical Anesthesia Plan  ASA: III  Anesthesia Plan: General   Post-op Pain Management:    Induction: Intravenous  PONV Risk Score and Plan: 3 and Ondansetron, Dexamethasone, Midazolam and Propofol infusion  Airway Management Planned: Oral ETT  Additional Equipment:   Intra-op Plan:   Post-operative Plan: Extubation in OR  Informed Consent: I have reviewed the patients History and Physical, chart, labs and  discussed the procedure including the risks, benefits and alternatives for the proposed anesthesia with the patient or authorized representative who has indicated his/her understanding and acceptance.   Dental advisory given  Plan Discussed with: CRNA  Anesthesia Plan Comments:         Anesthesia Quick Evaluation

## 2017-04-05 NOTE — Anesthesia Procedure Notes (Signed)
Procedure Name: Intubation Date/Time: 04/05/2017 11:08 AM Performed by: Teressa Lower Pre-anesthesia Checklist: Patient identified, Emergency Drugs available, Suction available and Patient being monitored Patient Re-evaluated:Patient Re-evaluated prior to induction Oxygen Delivery Method: Circle system utilized Preoxygenation: Pre-oxygenation with 100% oxygen Induction Type: IV induction Ventilation: Mask ventilation without difficulty Laryngoscope Size: Mac and 4 Grade View: Grade I Tube type: Oral Tube size: 7.5 mm Number of attempts: 1 Airway Equipment and Method: Stylet and Oral airway Placement Confirmation: ETT inserted through vocal cords under direct vision,  positive ETCO2 and breath sounds checked- equal and bilateral Secured at: 22 cm Tube secured with: Tape Dental Injury: Teeth and Oropharynx as per pre-operative assessment

## 2017-04-05 NOTE — Transfer of Care (Signed)
Immediate Anesthesia Transfer of Care Note  Patient: Devon Wood  Procedure(s) Performed: Procedure(s): Lumbar two-three Lumbar three-four Posterior lumbar fusion (N/A)  Patient Location: PACU  Anesthesia Type:General  Level of Consciousness: awake, alert  and oriented  Airway & Oxygen Therapy: Patient Spontanous Breathing and Patient connected to face mask oxygen  Post-op Assessment: Report given to RN and Post -op Vital signs reviewed and stable  Post vital signs: Reviewed and stable  Last Vitals:  Vitals:   04/05/17 0909  BP: (!) 151/89  Pulse: (!) 121  Resp: 20  Temp: 37.2 C    Last Pain:  Vitals:   04/05/17 0925  TempSrc:   PainSc: 6          Complications: No apparent anesthesia complications

## 2017-04-05 NOTE — Progress Notes (Signed)
Orthopedic Tech Progress Note Patient Details:  Devon Wood 09/04/1961 [id number] Patient has brace. Patient ID: Devon Wood, male   DOB: 07/25/1961, 56 y.o.   MRN: [id number]   Devon Wood, Devon Wood 04/05/2017, 8:03 PM

## 2017-04-05 NOTE — H&P (Signed)
Devon Wood is an 56 y.o. male.   Chief Complaint: Back and bilateral leg pain  HPI:  patient is very pleasant 56 year old gentleman previously undergone an L4-S1 fusion did very well for several years over last weeks and months of per has had progressively worsening back and bilateral leg pain rating to his quads L3 and L4 nerve root pattern. Workup has revealed a large disc herniation L3-4 above the level of his previous L4-5 fusion as well as spinal and a breakdown L2-3. Due to patient's failure conservative treatment imaging findings and progressive clinical syndrome I recommended decompression stabilization procedure with extension of his fusion to L2 with removal of hardware at L4-5. I've extensively reviewed the risks and benefits of the operation with the patient as well as perioperative course expectations of outcome alternatives of surgery and he understood and agreed to proceed forward.  Past Medical History:  Diagnosis Date  . Anxiety   . Arthritis   . Bronchitis    hx of;last time couple of yrs ago  . Bronchitis    hx of  . CAD (coronary artery disease) 10/10/2013   2004 2.0 x 25 mm drug-eluting cypher stent to LAD   . Chronic back pain    L3 buldging disc;DDD  . Complication of anesthesia    20-30 second asystole episode during induction  04/2003,  was on b-blocker tx at the time, found to have 80% LAD lesion s/p stent (but unclear if CAD was the direct cause)   . Coronary artery disease 2004   stent placement, sees Dr "C" [facility]  . Depression   . Diabetes mellitus    takes Glipizide and Metformin daily  . DM type 2 causing complication (HCC) 10/10/2013  . GERD (gastroesophageal reflux disease)    takes Pepcid daily  . Gout    hx of  . History of kidney stones   . HTN (hypertension) 10/10/2013  . Hypercholesteremia    takes Simvastatin daily  . Hypertension    takes Lisinopril daily  . Joint pain   . Kidney stones    "active ones at present"on the  left side  . Mixed hyperlipidemia 10/10/2013  . Morbid obesity (HCC) 10/10/2013  . Nocturia   . Peptic ulcer     Past Surgical History:  Procedure Laterality Date  . BACK SURGERY    . CARDIAC CATHETERIZATION  11/27/2009   widely patent stent  . COLONOSCOPY WITH PROPOFOL N/A 03/17/2015   Procedure: COLONOSCOPY WITH PROPOFOL;  Surgeon: Charolett BumpersMartin K Johnson, MD;  Location: [facility];  Service: Endoscopy;  Laterality: N/A;  . CORONARY STENT PLACEMENT  05/30/2003   proximal LAD  . ESOPHAGOGASTRODUODENOSCOPY  in the 90's  . HAND SURGERY  1980   right  . LUMBAR DISC SURGERY  2002   L5  . LUMBAR FUSION  2004  . NM MYOCAR PERF WALL MOTION  05/19/2004   no evidence of ischemia  . TONSILLECTOMY  1969  . US ECHOCARDIOGRAPHY  04/30/2003   mildly dilated aortic root,mild LA enlargement,mild concentric LVH,trivial mitral and tricuspid regurg.,mild mitral annular ca+    Family History  Problem Relation Age of Onset  . COPD Mother   . Heart attack Father   . COPD Brother   . COPD Brother   . Dementia Maternal Grandmother   . Cancer Maternal Grandfather   . Heart failure Paternal Grandmother   . Heart attack Paternal Grandfather   . Hypertension Sister   . Anesthesia problems Neg Hx   .  Hypotension Neg Hx   . Malignant hyperthermia Neg Hx   . Pseudochol deficiency Neg Hx    Social History:  reports that he quit smoking about 34 years ago. His smoking use included Cigarettes. He has a 2.00 pack-year smoking history. His smokeless tobacco use includes Chew. He reports that he drinks alcohol. He reports that he does not use drugs.  Allergies:  Allergies  Allergen Reactions  . Erythromycin Rash    Medications Prior to Admission  Medication Sig Dispense Refill  . allopurinol (ZYLOPRIM) 100 MG tablet Take 200 mg by mouth daily.      Marland Kitchen aspirin 81 MG tablet Take 81 mg by mouth daily.    . cyclobenzaprine (FLEXERIL) 10 MG tablet Take 10 mg by mouth 3 (three) times daily as needed for muscle  spasms.    Marland Kitchen escitalopram (LEXAPRO) 10 MG tablet Take 10 mg by mouth daily.    . famotidine (PEPCID) 20 MG tablet Take 20 mg by mouth daily.     Marland Kitchen glipiZIDE (GLUCOTROL) 5 MG tablet Take 5 mg by mouth 2 (two) times daily before a meal.     . HYDROcodone-acetaminophen (NORCO) 7.5-325 MG tablet Take 1 tablet by mouth every 4 (four) hours as needed for moderate pain.    Boris Lown Oil 300 MG CAPS Take 1 capsule by mouth daily.    Marland Kitchen lisinopril-hydrochlorothiazide (PRINZIDE,ZESTORETIC) 20-12.5 MG per tablet Take 1 tablet by mouth daily.    . meloxicam (MOBIC) 15 MG tablet Take 15 mg by mouth daily.    . metFORMIN (GLUCOPHAGE) 1000 MG tablet Take 1,000 mg by mouth daily with breakfast.     . Potassium 99 MG TABS Take 1 tablet by mouth daily.    . simvastatin (ZOCOR) 40 MG tablet Take 20 mg by mouth daily.      . sitaGLIPtin (JANUVIA) 100 MG tablet Take 100 mg by mouth daily.    . colchicine 0.6 MG tablet Take 0.6 mg by mouth daily as needed. For gout flares     . indomethacin (INDOCIN) 25 MG capsule Take 25 mg by mouth 2 (two) times daily as needed. For gout flares      Results for orders placed or performed during the hospital encounter of 04/05/17 (from the past 48 hour(s))  Glucose, capillary     Status: Abnormal   Collection Time: 04/05/17  9:06 AM  Result Value Ref Range   Glucose-Capillary 208 (H) 65 - 99 mg/dL   No results found.  Review of Systems  Musculoskeletal: Positive for back pain, joint pain and myalgias.  Neurological: Positive for sensory change.    Blood pressure (!) 151/89, pulse (!) 121, temperature 98.9 F (37.2 C), temperature source Oral, resp. rate 20, height 5\' 7"  (1.702 m), weight 107.5 kg (237 lb), SpO2 98 %. Physical Exam  Constitutional: He is oriented to person, place, and time. He appears well-developed.  HENT:  Head: Normocephalic.  Eyes: Pupils are equal, round, and reactive to light.  Neck: Normal range of motion.  Respiratory: Effort normal.  GI: Soft.   Musculoskeletal: Normal range of motion.  Neurological: He is alert and oriented to person, place, and time. He has normal strength. GCS eye subscore is 4. GCS verbal subscore is 5. GCS motor subscore is 6.  Patient is awake alert strength is 5 out of 5 iliopsoas, quads, hip she's, gastric, and tibialis, and EHL.  Skin: Skin is warm and dry.     Assessment/Plan 56 year old presents for decompression sterilization procedure  at L2-3 and L3-4.  Veola Cafaro P, MD 04/05/2017, 10:48 AM

## 2017-04-06 LAB — GLUCOSE, CAPILLARY
GLUCOSE-CAPILLARY: 199 mg/dL — AB (ref 65–99)
GLUCOSE-CAPILLARY: 233 mg/dL — AB (ref 65–99)
GLUCOSE-CAPILLARY: 157 mg/dL — AB (ref 65–99)
Glucose-Capillary: 201 mg/dL — ABNORMAL HIGH (ref 65–99)

## 2017-04-06 MED ORDER — METFORMIN HCL 500 MG PO TABS: 1000.0000 mg | ORAL_TABLET | Freq: Every day | ORAL | Status: DC

## 2017-04-06 MED ORDER — COLCHICINE 0.6 MG PO TABS: 0.6000 mg | ORAL_TABLET | Freq: Every day | ORAL | Status: DC | PRN

## 2017-04-06 MED ORDER — GLIPIZIDE 5 MG PO TABS: 5.0000 mg | ORAL_TABLET | Freq: Two times a day (BID) | ORAL | Status: DC

## 2017-04-06 MED ORDER — SIMVASTATIN 20 MG PO TABS
20.0000 mg | ORAL_TABLET | Freq: Every day | ORAL | Status: DC
  Administered 2017-04-06: 20 mg via ORAL
  Filled 2017-04-06: qty 1

## 2017-04-06 MED ORDER — LINAGLIPTIN 5 MG PO TABS
5.0000 mg | ORAL_TABLET | Freq: Every day | ORAL | Status: DC
  Administered 2017-04-07: 5 mg via ORAL
  Filled 2017-04-06: qty 1

## 2017-04-06 MED ORDER — LISINOPRIL 20 MG PO TABS
20.0000 mg | ORAL_TABLET | Freq: Every day | ORAL | Status: DC
  Administered 2017-04-06 – 2017-04-07 (×2): 20 mg via ORAL
  Filled 2017-04-06 (×2): qty 1

## 2017-04-06 MED ORDER — ALLOPURINOL 100 MG PO TABS
200.0000 mg | ORAL_TABLET | Freq: Every day | ORAL | Status: DC
  Administered 2017-04-06 – 2017-04-07 (×2): 200 mg via ORAL
  Filled 2017-04-06 (×2): qty 2

## 2017-04-06 MED ORDER — METFORMIN HCL 500 MG PO TABS
1000.0000 mg | ORAL_TABLET | Freq: Every day | ORAL | Status: DC
  Administered 2017-04-06 – 2017-04-07 (×2): 1000 mg via ORAL
  Filled 2017-04-06 (×2): qty 2

## 2017-04-06 MED ORDER — FAMOTIDINE 20 MG PO TABS
20.0000 mg | ORAL_TABLET | Freq: Every day | ORAL | Status: DC
  Administered 2017-04-06 – 2017-04-07 (×2): 20 mg via ORAL
  Filled 2017-04-06 (×2): qty 1

## 2017-04-06 MED ORDER — LISINOPRIL-HYDROCHLOROTHIAZIDE 20-12.5 MG PO TABS: 1.0000 | ORAL_TABLET | Freq: Every day | ORAL | Status: DC

## 2017-04-06 MED ORDER — ESCITALOPRAM OXALATE 10 MG PO TABS
10.0000 mg | ORAL_TABLET | Freq: Every day | ORAL | Status: DC
  Administered 2017-04-06 – 2017-04-07 (×2): 10 mg via ORAL
  Filled 2017-04-06 (×2): qty 1

## 2017-04-06 MED ORDER — HYDROCHLOROTHIAZIDE 12.5 MG PO CAPS
12.5000 mg | ORAL_CAPSULE | Freq: Every day | ORAL | Status: DC
  Administered 2017-04-06 – 2017-04-07 (×2): 12.5 mg via ORAL
  Filled 2017-04-06 (×2): qty 1

## 2017-04-06 MED ORDER — INDOMETHACIN 25 MG PO CAPS: 25.0000 mg | ORAL_CAPSULE | Freq: Two times a day (BID) | ORAL | Status: DC | PRN

## 2017-04-06 MED ORDER — GLIPIZIDE 5 MG PO TABS
5.0000 mg | ORAL_TABLET | Freq: Two times a day (BID) | ORAL | Status: DC
  Administered 2017-04-06 – 2017-04-07 (×2): 5 mg via ORAL
  Filled 2017-04-06 (×3): qty 1

## 2017-04-06 MED ORDER — POTASSIUM 99 MG PO TABS: 1.0000 | ORAL_TABLET | Freq: Every day | ORAL | Status: DC

## 2017-04-06 MED ORDER — MELOXICAM 7.5 MG PO TABS
15.0000 mg | ORAL_TABLET | Freq: Every day | ORAL | Status: DC
  Administered 2017-04-07: 15 mg via ORAL
  Filled 2017-04-06: qty 2

## 2017-04-06 NOTE — Evaluation (Signed)
Occupational Therapy Evaluation and Discharge Patient Details Name: Devon Wood MRN: [id number] DOB: 06/27/1961 Today's Date: 04/06/2017    History of Present Illness Pt is a 56 y.o. male s/p L2-4 PLIF. PMHx: Anxiety, Arthritis, CAD, Chronic back pain, Depression, DM 2, GERD, Gout, HTN, Obesity.   Clinical Impression   Pt reports he was independent with ADL PTA. Currently pt supervision with ADL and functional mobility. All back, safety, and ADL education completed with pt. Pt planning to d/c home with supervision from family. No further acute OT needs identified; signing off at this time. Please re-consult if needs change. Thank you for this referral.    Follow Up Recommendations  No OT follow up    Equipment Recommendations  None recommended by OT    Recommendations for Other Services       Precautions / Restrictions Precautions Precautions: Back Precaution Booklet Issued: Yes (comment) Precaution Comments: Reviewed back precautions with pt. Required Braces or Orthoses: Spinal Brace Spinal Brace: Lumbar corset;Applied in sitting position Restrictions Weight Bearing Restrictions: No      Mobility Bed Mobility               General bed mobility comments: Pt sitting EOB with PT upon arrival  Transfers Overall transfer level: Needs assistance Equipment used: None Transfers: Sit to/from Stand Sit to Stand: Supervision         General transfer comment: Supervision for safety; no physical assist. Increased time and effort for sit to stand    Balance Overall balance assessment: Needs assistance Sitting-balance support: Feet supported;No upper extremity supported Sitting balance-Leahy Scale: Normal     Standing balance support: No upper extremity supported;During functional activity Standing balance-Leahy Scale: Good                             ADL either performed or assessed with clinical judgement   ADL Overall ADL's : Needs  assistance/impaired Eating/Feeding: Independent;Sitting   Grooming: Supervision/safety;Standing Grooming Details (indicate cue type and reason): Educated on use of 2 cups for oral care Upper Body Bathing: Set up;Sitting   Lower Body Bathing: Supervison/ safety;Sit to/from stand   Upper Body Dressing : Set up;Sitting Upper Body Dressing Details (indicate cue type and reason): to don brace Lower Body Dressing: Supervision/safety;Sit to/from stand Lower Body Dressing Details (indicate cue type and reason): Educated pt on compensatory strategies for LB ADL. Toilet Transfer: Supervision/safety;Ambulation     Toileting - Clothing Manipulation Details (indicate cue type and reason): Educated on proper technique for peri care without twisting and use of wet wipes Tub/ Shower Transfer: Supervision/safety;Tub transfer;Ambulation Tub/Shower Transfer Details (indicate cue type and reason): Simulated in room; pt able to perform with supervision for safety Functional mobility during ADLs: Supervision/safety General ADL Comments: Educated pt on maintaining back precautions during functional activities, keeping frequently used items at counter top height, frequent mobility thorughout the day upon return home     Vision         Perception     Praxis      Pertinent Vitals/Pain Pain Assessment: Faces Faces Pain Scale: Hurts little more Pain Location: back Pain Descriptors / Indicators: Aching;Sore Pain Intervention(s): Monitored during session     Hand Dominance     Extremity/Trunk Assessment Upper Extremity Assessment Upper Extremity Assessment: Overall WFL for tasks assessed;RUE deficits/detail RUE Deficits / Details: Hx of R shoulder sx; limited ROM.   Lower Extremity Assessment Lower Extremity Assessment: Defer to PT evaluation  Cervical / Trunk Assessment Cervical / Trunk Assessment: Other exceptions Cervical / Trunk Exceptions: s/p spinal sx   Communication  Communication Communication: No difficulties   Cognition Arousal/Alertness: Awake/alert Behavior During Therapy: WFL for tasks assessed/performed Overall Cognitive Status: Within Functional Limits for tasks assessed                                     General Comments       Exercises     Shoulder Instructions      Home Living Family/patient expects to be discharged to:: Private residence Living Arrangements: Spouse/significant other Available Help at Discharge: Family Type of Home: House Home Access: Ramped entrance     Home Layout: One level     Bathroom Shower/Tub: Tub/shower unit;Curtain   Bathroom Toilet: Handicapped height     Home Equipment: Cane - single point;Grab bars - tub/shower;Adaptive equipment Adaptive Equipment: Reacher        Prior Functioning/Environment Level of Independence: Independent                 OT Problem List:        OT Treatment/Interventions:      OT Goals(Current goals can be found in the care plan section) Acute Rehab OT Goals Patient Stated Goal: go home OT Goal Formulation: All assessment and education complete, DC therapy  OT Frequency:     Barriers to D/C:            Co-evaluation              AM-PAC PT "6 Clicks" Daily Activity     Outcome Measure Help from another person eating meals?: None Help from another person taking care of personal grooming?: A Little Help from another person toileting, which includes using toliet, bedpan, or urinal?: A Little Help from another person bathing (including washing, rinsing, drying)?: A Little Help from another person to put on and taking off regular upper body clothing?: None Help from another person to put on and taking off regular lower body clothing?: A Little 6 Click Score: 20   End of Session Equipment Utilized During Treatment: Back brace Nurse Communication: Mobility status;Other (comment) (no equipment of f/u needs)  Activity Tolerance:  Patient tolerated treatment well Patient left: Other (comment) (with PT)  OT Visit Diagnosis: Other abnormalities of gait and mobility (R26.89)                Time: 8469-62950904-0915 OT Time Calculation (min): 11 min Charges:  OT General Charges $OT Visit: 1 Procedure OT Evaluation $OT Eval Moderate Complexity: 1 Procedure G-Codes:     Devon Wood, M.S., OTR/L Pager: [phone]  Gaye AlkenBailey A Adrea Sherpa 04/06/2017, 9:26 AM

## 2017-04-06 NOTE — Evaluation (Signed)
Physical Therapy Evaluation and Discharge Patient Details Name: Devon Wood MRN: [id number] DOB: 01/21/1961 Today's Date: 04/06/2017   History of Present Illness  Pt is a 56 y.o. male s/p L2-4 PLIF. PMHx: Anxiety, Arthritis, CAD, Chronic back pain, Depression, DM 2, GERD, Gout, HTN, Obesity.  Clinical Impression  Patient evaluated by Physical Therapy with no further acute PT needs identified. All education has been completed and the patient has no further questions. At the time of PT eval pt was able to perform transfers and ambulation with gross modified independence. Pt was educated on walking program, brace application and wearing schedule, precautions, and general safety with activity progression. See below for any follow-up Physical Therapy or equipment needs. PT is signing off. Thank you for this referral.     Follow Up Recommendations No PT follow up;Supervision for mobility/OOB    Equipment Recommendations  None recommended by PT    Recommendations for Other Services       Precautions / Restrictions Precautions Precautions: Back Precaution Booklet Issued: Yes (comment) Precaution Comments: Reviewed back precautions with pt. Required Braces or Orthoses: Spinal Brace Spinal Brace: Lumbar corset;Applied in sitting position Restrictions Weight Bearing Restrictions: No      Mobility  Bed Mobility Overal bed mobility: Modified Independent             General bed mobility comments: Pt sitting EOB with PT upon arrival  Transfers Overall transfer level: Modified independent Equipment used: None Transfers: Sit to/from Stand Sit to Stand: Supervision         General transfer comment: Supervision for safety; no physical assist. Increased time and effort for sit to stand  Ambulation/Gait Ambulation/Gait assistance: Modified independent (Device/Increase time) Ambulation Distance (Feet): 400 Feet Assistive device: None Gait Pattern/deviations: Step-through  pattern;Decreased stride length Gait velocity: Decreased Gait velocity interpretation: Below normal speed for age/gender General Gait Details: Pt ambulating well with no unsteadiness or LOB noted.   Stairs            Wheelchair Mobility    Modified Rankin (Stroke Patients Only)       Balance Overall balance assessment: Needs assistance Sitting-balance support: Feet supported;No upper extremity supported Sitting balance-Leahy Scale: Normal     Standing balance support: No upper extremity supported;During functional activity Standing balance-Leahy Scale: Good                               Pertinent Vitals/Pain Pain Assessment: Faces Faces Pain Scale: Hurts little more Pain Location: back Pain Descriptors / Indicators: Aching;Sore Pain Intervention(s): Monitored during session    Home Living Family/patient expects to be discharged to:: Private residence Living Arrangements: Spouse/significant other Available Help at Discharge: Family Type of Home: House Home Access: Ramped entrance     Home Layout: One level Home Equipment: Cane - single point;Grab bars - tub/shower;Adaptive equipment;Shower seat      Prior Function Level of Independence: Independent         Comments: 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    Lower Extremity Assessment Lower Extremity Assessment: RLE deficits/detail;LLE deficits/detail RLE Deficits / Details: Reports numbness throughout RLE. Decreased strength consistent with pre-op diagnosis LLE Deficits / Details: Pt reports  improved numbness in LLE, however cramping sensation in the L hip with mobility. Decreased strength consistent with pre-op diagnosis.    Cervical /  Trunk Assessment Cervical / Trunk Assessment: Other exceptions Cervical / Trunk Exceptions: s/p spinal sx  Communication   Communication: No difficulties  Cognition Arousal/Alertness: Awake/alert Behavior During Therapy: WFL for tasks assessed/performed Overall Cognitive Status: Within Functional Limits for tasks assessed                                        General Comments      Exercises     Assessment/Plan    PT Assessment Patent does not need any further PT services  PT Problem List         PT Treatment Interventions      PT Goals (Current goals can be found in the Care Plan section)  Acute Rehab PT Goals Patient Stated Goal: go home PT Goal Formulation: All assessment and education complete, DC therapy    Frequency     Barriers to discharge        Co-evaluation               AM-PAC PT "6 Clicks" Daily Activity  Outcome Measure Difficulty turning over in bed (including adjusting bedclothes, sheets and blankets)?: None Difficulty moving from lying on back to sitting on the side of the bed? : None Difficulty sitting down on and standing up from a chair with arms (e.g., wheelchair, bedside commode, etc,.)?: None Help needed moving to and from a bed to chair (including a wheelchair)?: None Help needed walking in hospital room?: None Help needed climbing 3-5 steps with a railing? : None 6 Click Score: 24    End of Session Equipment Utilized During Treatment: Back brace Activity Tolerance: Patient tolerated treatment well Patient left: in chair;with call bell/phone within reach Nurse Communication: Mobility status PT Visit Diagnosis: Pain;Other symptoms and signs involving the nervous system (R29.898) Pain - part of body:  (back)    Time: [id number] PT Time Calculation (min) (ACUTE ONLY): 24 min   Charges:   PT Evaluation $PT Eval Moderate Complexity: 1 Mod PT Treatments $Gait Training: 8-22 mins   PT G Codes:        Conni SlipperLaura  Chasten Blaze, PT, DPT Acute Rehabilitation Services Pager: [phone]   Marylynn PearsonLaura D Matalynn Graff 04/06/2017, 10:08 AM

## 2017-04-06 NOTE — Anesthesia Postprocedure Evaluation (Signed)
Anesthesia Post Note  Patient: Devon Wood  Procedure(s) Performed: Procedure(s) (LRB): Lumbar two-three Lumbar three-four Posterior lumbar fusion (N/A)     Patient location during evaluation: PACU Anesthesia Type: General Level of consciousness: awake and alert Pain management: pain level controlled Vital Signs Assessment: post-procedure vital signs reviewed and stable Respiratory status: spontaneous breathing, nonlabored ventilation, respiratory function stable and patient connected to nasal cannula oxygen Cardiovascular status: blood pressure returned to baseline and stable Postop Assessment: no signs of nausea or vomiting Anesthetic complications: no    Last Vitals:  Vitals:   04/06/17 0400 04/06/17 0801  BP: 137/77 140/80  Pulse: (!) 119 (!) 116  Resp: 18 20  Temp: 37.2 C 37.2 C    Last Pain:  Vitals:   04/06/17 0801  TempSrc: Oral  PainSc:                  Shelton SilvasKevin D Hollis

## 2017-04-06 NOTE — Progress Notes (Signed)
Subjective: Patient reports back pain 8/10. He does have some numbness in his right leg still. Denies any weakness or pain in his legs. States that he is doing a lot better and very pleased  Objective: Vital signs in last 24 hours: Temp:  [97.8 F (36.6 C)-100 F (37.8 C)] 99 F (37.2 C) (08/02 0801) Pulse Rate:  [110-128] 116 (08/02 0801) Resp:  [7-24] 20 (08/02 0801) BP: (90-154)/(56-84) 140/80 (08/02 0801) SpO2:  [93 %-100 %] 95 % (08/02 0801)  Intake/Output from previous day: 08/01 0701 - 08/02 0700 In: 4328.8 [P.O.:480; I.V.:3373.8; Blood:125; IV Piggyback:350] Out: 5510 [Urine:4700; Drains:460; Blood:350] Intake/Output this shift: Total I/O In: 240 [P.O.:240] Out: 700 [Urine:600; Drains:100]  Neurologic: Grossly normal  Lab Results: Lab Results  Component Value Date   WBC 7.5 03/28/2017   HGB 13.8 03/28/2017   HCT 40.5 03/28/2017   MCV 92.7 03/28/2017   PLT 242 03/28/2017   Lab Results  Component Value Date   INR 1.00 11/27/2009   BMET Lab Results  Component Value Date   NA 137 03/28/2017   K 4.6 03/28/2017   CL 102 03/28/2017   CO2 25 03/28/2017   GLUCOSE 121 (H) 03/28/2017   BUN 17 03/28/2017   CREATININE 1.05 03/28/2017   CALCIUM 9.3 03/28/2017    Studies/Results: Dg Lumbar Spine 2-3 Views  Result Date: 04/05/2017 CLINICAL DATA:  L2-3 and L3-4 PLIF. EXAM: DG C-ARM 61-120 MIN; LUMBAR SPINE - 2-3 VIEW COMPARISON:  Radiography 01/02/2017 FINDINGS: Pre-existing L4-5 discectomy. Fluoroscopy for L2-3 and L3-4 discectomy. Intervertebral cage and pedicle screw placement is unremarkable. No evidence of intraoperative fracture. IMPRESSION: Fluoroscopy for L2-3 and L3-4 PLIF. Pre-existing L4-5 PLIF. No unexpected finding. Electronically Signed   By: Marnee SpringJonathon  Watts M.D.   On: 04/05/2017 15:27   Dg C-arm 1-60 Min  Result Date: 04/05/2017 CLINICAL DATA:  L2-3 and L3-4 PLIF. EXAM: DG C-ARM 61-120 MIN; LUMBAR SPINE - 2-3 VIEW COMPARISON:  Radiography 01/02/2017  FINDINGS: Pre-existing L4-5 discectomy. Fluoroscopy for L2-3 and L3-4 discectomy. Intervertebral cage and pedicle screw placement is unremarkable. No evidence of intraoperative fracture. IMPRESSION: Fluoroscopy for L2-3 and L3-4 PLIF. Pre-existing L4-5 PLIF. No unexpected finding. Electronically Signed   By: Marnee SpringJonathon  Watts M.D.   On: 04/05/2017 15:27    Assessment/Plan: Hemovac drain total of 560mL since admission. Will continue to monitor. Ambulate and pain management. Incision CDI.    LOS: 1 day    Devon LoftKimberly Hannah Wood [facility] 04/06/2017, 9:23 AM

## 2017-04-07 LAB — GLUCOSE, CAPILLARY: GLUCOSE-CAPILLARY: 202 mg/dL — AB (ref 65–99)

## 2017-04-07 MED ORDER — CYCLOBENZAPRINE HCL 10 MG PO TABS: 10.0000 mg | ORAL_TABLET | Freq: Three times a day (TID) | ORAL | 0 refills | Status: DC | PRN

## 2017-04-07 MED ORDER — HYDROCODONE-ACETAMINOPHEN 5-325 MG PO TABS: 1.0000 | ORAL_TABLET | ORAL | 0 refills | Status: DC | PRN

## 2017-04-07 NOTE — Discharge Instructions (Signed)

## 2017-04-07 NOTE — Progress Notes (Signed)
Patient is discharged from room 3C10 at this time. Alert and in stable condition. IV site d/c'd and instructions read to patient and wife with understanding verbalized. Left unit via wheelchair with all belongings at side.

## 2017-04-07 NOTE — Discharge Summary (Signed)
Physician Discharge Summary  Patient ID: Liliane Channelimothy M Hodsdon MRN: [id number] DOB/AGE: 55/10/1960 55 y.o.  Admit date: 04/05/2017 Discharge date: 04/07/2017  Admission Diagnoses: Herniated nucleus pulposus L3-4 spinal stenosis L3-4. 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 The patient tolerated the procedure well and was taken to the recovery room and then to the room in stable condition. The hospital course was routine. There were no complications. The wound remained clean dry and intact. Pt had appropriate back soreness. No complaints of leg pain or new N/T/W. The patient remained afebrile with stable vital signs, and tolerated a regular diet. The patient continued to increase activities, and pain was well controlled with oral pain medications.   Consults: None  Significant Diagnostic Studies:  Results for orders placed or performed during the hospital encounter of 04/05/17  Glucose, capillary  Result Value Ref Range   Glucose-Capillary 208 (H) 65 - 99 mg/dL  Glucose, capillary  Result Value Ref Range   Glucose-Capillary 242 (H) 65 - 99 mg/dL  Glucose, capillary  Result Value Ref Range   Glucose-Capillary 361 (H) 65 - 99 mg/dL   Comment 1 Notify RN    Comment 2 Document in Chart   Glucose, capillary  Result Value Ref Range   Glucose-Capillary 201 (H) 65 - 99 mg/dL  Glucose, capillary  Result Value Ref Range   Glucose-Capillary 199 (H) 65 - 99 mg/dL  Glucose, capillary  Result Value Ref Range   Glucose-Capillary 233 (H) 65 - 99 mg/dL  Glucose, capillary  Result Value Ref Range   Glucose-Capillary 157 (H) 65 - 99 mg/dL   Comment 1 Notify RN    Comment 2 Document in Chart     Dg Lumbar Spine 2-3  Views  Result Date: 04/05/2017 CLINICAL DATA:  L2-3 and L3-4 PLIF. EXAM: DG C-ARM 61-120 MIN; LUMBAR SPINE - 2-3 VIEW COMPARISON:  Radiography 01/02/2017 FINDINGS: Pre-existing L4-5 discectomy. Fluoroscopy for L2-3 and L3-4 discectomy. Intervertebral cage and pedicle screw placement is unremarkable. No evidence of intraoperative fracture. IMPRESSION: Fluoroscopy for L2-3 and L3-4 PLIF. Pre-existing L4-5 PLIF. No unexpected finding. Electronically Signed   By: Marnee SpringJonathon  Watts M.D.   On: 04/05/2017 15:27   Dg C-arm 1-60 Min  Result Date: 04/05/2017 CLINICAL DATA:  L2-3 and L3-4 PLIF. EXAM: DG C-ARM 61-120 MIN; LUMBAR SPINE - 2-3 VIEW COMPARISON:  Radiography 01/02/2017 FINDINGS: Pre-existing L4-5 discectomy. Fluoroscopy for L2-3 and L3-4 discectomy. Intervertebral cage and pedicle screw placement is unremarkable. No evidence of intraoperative fracture. IMPRESSION: Fluoroscopy for L2-3 and L3-4 PLIF. Pre-existing L4-5 PLIF. No unexpected finding. Electronically Signed   By: Marnee SpringJonathon  Watts M.D.   On: 04/05/2017 15:27    Antibiotics:  Anti-infectives    Start     Dose/Rate Route Frequency Ordered Stop   04/05/17 2000  ceFAZolin (ANCEF) IVPB 2g/100 mL premix     2 g 200 mL/hr over 30 Minutes Intravenous Every 8 hours 04/05/17 1832 04/06/17 0441   04/05/17 1541  vancomycin (VANCOCIN) powder  Status:  Discontinued       As needed 04/05/17 1541 04/05/17 1617   04/05/17 1204  bacitracin 50,000 Units in sodium chloride irrigation 0.9 % 500 mL irrigation  Status:  Discontinued       As needed 04/05/17  1204 04/05/17 1617   04/05/17 0910  ceFAZolin (ANCEF) 2-4 GM/100ML-% IVPB    Comments:  Aquilla HackerWalton, Susan   : cabinet override      04/05/17 0910 04/05/17 2114   04/05/17 0906  ceFAZolin (ANCEF) IVPB 2g/100 mL premix     2 g 200 mL/hr over 30 Minutes Intravenous On call to O.R. 04/05/17 0906 04/05/17 1511      Discharge Exam: Blood pressure 116/72, pulse (!) 112, temperature 98.5 F (36.9 C), temperature  source Oral, resp. rate 18, height 5\' 7"  (1.702 m), weight 107.5 kg (237 lb), SpO2 98 %. Neurologic: Grossly normal MAE well, ambulating and voiding well, Still has some numbness in his right leg but denies any pain.   Discharge Medications:     Disposition: home   Final Dx: same as admitting  Discharge Instructions    Incentive spirometry RT    Complete by:  As directed          Signed: Tiana LoftKimberly Hannah Jhane Lorio 04/07/2017, 7:54 AM

## 2017-11-06 NOTE — Telephone Encounter (Signed)
7/27/18[facility]'d [facility] with Thurmon FairMihai Croitoru MD on 04/03/17 at 8:40am

## 2018-03-11 ENCOUNTER — Emergency Department (HOSPITAL_COMMUNITY)
Admission: EM | Admit: 2018-03-11 | Discharge: 2018-03-12 | Disposition: A | Discharge status: Home / Self Care | Payer: BC Managed Care – PPO | Attending: Emergency Medicine | Admitting: Emergency Medicine

## 2018-03-11 DIAGNOSIS — I10 Essential (primary) hypertension: Secondary | ICD-10-CM | POA: Insufficient documentation

## 2018-03-11 DIAGNOSIS — Z7984 Long term (current) use of oral hypoglycemic drugs: Secondary | ICD-10-CM | POA: Insufficient documentation

## 2018-03-11 DIAGNOSIS — E119 Type 2 diabetes mellitus without complications: Secondary | ICD-10-CM | POA: Diagnosis not present

## 2018-03-11 DIAGNOSIS — I2510 Atherosclerotic heart disease of native coronary artery without angina pectoris: Secondary | ICD-10-CM | POA: Diagnosis not present

## 2018-03-11 DIAGNOSIS — Z87891 Personal history of nicotine dependence: Secondary | ICD-10-CM | POA: Insufficient documentation

## 2018-03-11 DIAGNOSIS — G8929 Other chronic pain: Secondary | ICD-10-CM

## 2018-03-11 DIAGNOSIS — Z79899 Other long term (current) drug therapy: Secondary | ICD-10-CM | POA: Insufficient documentation

## 2018-03-11 DIAGNOSIS — M545 Low back pain: Secondary | ICD-10-CM | POA: Insufficient documentation

## 2018-03-12 ENCOUNTER — Encounter (HOSPITAL_COMMUNITY): Payer: Self-pay | Admitting: Emergency Medicine

## 2018-03-12 NOTE — ED Notes (Signed)
Pt complains of back pain. Pt reports he is waiting on his insurance so he can have surgery. Pt is seeing Dr. Glee ArvinKram.

## 2018-03-12 NOTE — ED Provider Notes (Signed)
[name] EMERGENCY DEPARTMENT Provider Note   CSN: [id number] Arrival date & time: 03/11/18  2354     History   Chief Complaint Chief Complaint  Patient presents with  . Back Pain    HPI Devon Wood is a 57 y.o. male with a hx of CAD, hyperlipidemia, NIDDM, obesity, chronic back pain, lumbar stenosis presents to the Emergency Department complaining of gradual, persistent, progressively worsening low back pain onset months ago with worsening in the last week.  Pt reports previous spinal fusion by Dr. Wynetta Emery.  He reports that since the initial surgery he has had several screws in his back break leading to worsening pain.  Patient reports he has been taking meloxicam, Flexeril and tramadol at home without significant relief.  He reports he is called Dr. Lonie Peak office several times this week but due to insurance issues he has been unable to be seen.  He reports walking and standing make his pain better but lying it makes his pain significantly worse.  He reports he presents tonight for pain relief.  Patient denies numbness, tingling, weakness, gait disturbance, loss of bowel or bladder control.  He denies fevers, weight loss.  He denies falls or new trauma.  The history is provided by the patient and medical records. No language interpreter was used.    Past Medical History:  Diagnosis Date  . Anxiety   . Arthritis   . Bronchitis    hx of;last time couple of yrs ago  . Bronchitis    hx of  . CAD (coronary artery disease) 10/10/2013   2004 2.0 x 25 mm drug-eluting cypher stent to LAD   . Chronic back pain    L3 buldging disc;DDD  . Complication of anesthesia    20-30 second asystole episode during induction  04/2003,  was on b-blocker tx at the time, found to have 80% LAD lesion s/p stent (but unclear if CAD was the direct cause)   . Coronary artery disease 2004   stent placement, [facility]  . Depression   . Diabetes mellitus    takes  Glipizide and Metformin daily  . DM type 2 causing complication (HCC) 10/10/2013  . GERD (gastroesophageal reflux disease)    takes Pepcid daily  . Gout    hx of  . History of kidney stones   . HTN (hypertension) 10/10/2013  . Hypercholesteremia    takes Simvastatin daily  . Hypertension    takes Lisinopril daily  . Joint pain   . Kidney stones    "active ones at present"on the left side  . Mixed hyperlipidemia 10/10/2013  . Morbid obesity (HCC) 10/10/2013  . Nocturia   . Peptic ulcer     Patient Active Problem List   Diagnosis Date Noted  . Spinal stenosis 04/05/2017  . Severe obesity (BMI 35.0-35.9 with comorbidity) (HCC) 04/04/2017  . CAD (coronary artery disease) 10/10/2013  . Mixed hyperlipidemia 10/10/2013  . DM type 2 causing complication (HCC) 10/10/2013  . HTN (hypertension) 10/10/2013  . Morbid obesity (HCC) 10/10/2013  . Lumbar spinal stenosis 10/10/2013    Past Surgical History:  Procedure Laterality Date  . BACK SURGERY    . CARDIAC CATHETERIZATION  11/27/2009   widely patent stent  . COLONOSCOPY WITH PROPOFOL N/A 03/17/2015   Procedure: COLONOSCOPY WITH PROPOFOL;  Surgeon: Charolett Bumpers, MD;  Location: [facility];  Service: Endoscopy;  Laterality: N/A;  . CORONARY STENT PLACEMENT  05/30/2003   proximal  LAD  . ESOPHAGOGASTRODUODENOSCOPY  in the 90's  . HAND SURGERY  1980   right  . LUMBAR DISC SURGERY  2002   L5  . LUMBAR FUSION  2004  . NM MYOCAR PERF WALL MOTION  05/19/2004   no evidence of ischemia  . TONSILLECTOMY  1969  . US ECHOCARDIOGRAPHY  04/30/2003   mildly dilated aortic root,mild LA enlargement,mild concentric LVH,trivial mitral and tricuspid regurg.,mild mitral annular ca+        Home Medications    Prior to Admission medications   Medication Sig Start Date End Date Taking? Authorizing Provider  allopurinol (ZYLOPRIM) 100 MG tablet Take 200 mg by mouth daily.      [provider]  aspirin 81 MG tablet Take 81 mg by mouth daily.     [provider]  colchicine 0.6 MG tablet Take 0.6 mg by mouth daily as needed. For gout flares     [provider]  cyclobenzaprine (FLEXERIL) 10 MG tablet Take 10 mg by mouth 3 (three) times daily as needed for muscle spasms.    [provider]  cyclobenzaprine (FLEXERIL) 10 MG tablet Take 1 tablet (10 mg total) by mouth 3 (three) times daily as needed for muscle spasms. 04/07/17   Donalee Citrin, MD  escitalopram (LEXAPRO) 10 MG tablet Take 10 mg by mouth daily. 10/07/13   [provider]  famotidine (PEPCID) 20 MG tablet Take 20 mg by mouth daily.     [provider]  glipiZIDE (GLUCOTROL) 5 MG tablet Take 5 mg by mouth 2 (two) times daily before a meal.     [provider]  HYDROcodone-acetaminophen (NORCO/VICODIN) 5-325 MG tablet Take 1-2 tablets by mouth every 4 (four) hours as needed (breakthrough pain). 04/07/17   Donalee Citrin, MD  indomethacin (INDOCIN) 25 MG capsule Take 25 mg by mouth 2 (two) times daily as needed. For gout flares    [provider]  Krill Oil 300 MG CAPS Take 1 capsule by mouth daily.    [provider]  lisinopril-hydrochlorothiazide (PRINZIDE,ZESTORETIC) 20-12.5 MG per tablet Take 1 tablet by mouth daily. 09/20/13   [provider]  meloxicam (MOBIC) 15 MG tablet Take 15 mg by mouth daily.    [provider]  metFORMIN (GLUCOPHAGE) 1000 MG tablet Take 1,000 mg by mouth daily with breakfast.     [provider]  Potassium 99 MG TABS Take 1 tablet by mouth daily.    [provider]  simvastatin (ZOCOR) 40 MG tablet Take 20 mg by mouth daily.      [provider]  sitaGLIPtin (JANUVIA) 100 MG tablet Take 100 mg by mouth daily.    [provider]    Family History Family History  Problem Relation Age of Onset  . COPD Mother   . Heart attack Father   . COPD Brother   . COPD Brother   . Dementia Maternal Grandmother   . Cancer Maternal Grandfather     . Heart failure Paternal Grandmother   . Heart attack Paternal Grandfather   . Hypertension Sister   . Anesthesia problems Neg Hx   . Hypotension Neg Hx   . Malignant hyperthermia Neg Hx   . Pseudochol deficiency Neg Hx     Social History Social History   Tobacco Use  . Smoking status: Former Smoker    Packs/day: 0.50    Years: 4.00    Pack years: 2.00    Types: Cigarettes    Last attempt  to quit: 1984    Years since quitting: 35.5  . Smokeless tobacco: Current User    Types: Chew  Substance Use Topics  . Alcohol use: Yes    Comment: occasionally  . Drug use: No     Allergies   Erythromycin   Review of Systems Review of Systems  Constitutional: Negative for fatigue and fever.  Respiratory: Negative for chest tightness and shortness of breath.   Cardiovascular: Negative for chest pain.  Gastrointestinal: Negative for abdominal pain, diarrhea, nausea and vomiting.  Genitourinary: Negative for dysuria, frequency, hematuria and urgency.  Musculoskeletal: Positive for back pain. Negative for gait problem, joint swelling, neck pain and neck stiffness.  Skin: Negative for rash.  Neurological: Negative for weakness, light-headedness, numbness and headaches.  All other systems reviewed and are negative.    Physical Exam Updated Vital Signs BP (!) 148/85   Pulse 84   Temp 98.8 F (37.1 C) (Oral)   Resp 18   Ht 5\' 7"  (1.702 m)   Wt 104.3 kg (230 lb)   SpO2 98%   BMI 36.02 kg/m   Physical Exam  Constitutional: He appears well-developed and well-nourished. No distress.  HENT:  Head: Normocephalic and atraumatic.  Mouth/Throat: Oropharynx is clear and moist. No oropharyngeal exudate.  Eyes: Conjunctivae are normal.  Neck: Normal range of motion. Neck supple.  Full ROM without pain  Cardiovascular: Normal rate, regular rhythm and intact distal pulses.  Pulmonary/Chest: Effort normal and breath sounds normal. No respiratory distress. He has no wheezes.   Abdominal: Soft. He exhibits no distension. There is no tenderness.  Musculoskeletal:       Lumbar back: He exhibits pain.  Full range of motion of the T-spine and L-spine No midline tenderness to the  T-spine or L-spine Tenderness to palpation of the bilateral paraspinous muscles of the L-spine. Well-healed midline surgical incision over the lumbar spine. Range of motion of the bilateral hips, knees and ankles.  Lymphadenopathy:    He has no cervical adenopathy.  Neurological: He is alert.  Speech is clear and goal oriented, follows commands Normal 5/5 strength in upper and lower extremities bilaterally including dorsiflexion and plantar flexion, strong and equal grip strength Sensation normal to light and sharp touch Moves extremities without ataxia, coordination intact Normal gait Normal balance  Skin: Skin is warm and dry. No rash noted. He is not diaphoretic. No erythema.  Psychiatric: He has a normal mood and affect. His behavior is normal.  Nursing note and vitals reviewed.    ED Treatments / Results   Procedures Procedures (including critical care time)  Medications Ordered in ED Medications - No data to display   Initial Impression / Assessment and Plan / ED Course  I have reviewed the triage vital signs and the nursing notes.  Pertinent labs & imaging results that were available during my care of the patient were reviewed by me and considered in my medical decision making (see chart for details).     Patient with acute on chronic back pain.  No neurological deficits and normal neuro exam.  Patient walks with steady gait.  No loss of bowel or bladder control.  No concern for cauda equina.  No indication for urgent imaging tonight.  No fever, night sweats, weight loss, h/o cancer, IVDU.  Patient reports he drove to the emergency room tonight.  I have offered narcotic pain control while here in the emergency room however patient will need a driver.  He declines this.   I  have offered to change his muscle relaxers for home he declines this as well.  Discussed with patient that at this time patient needs to contact his neurosurgeon or primary care for escalation of pain control at home.  Patient reports that he understands this and wishes to be discharged at this time.  He does not want any additional options for treatment.  RICE protocol and attenuated home therapy recommended.  I also recommended that patient call neurosurgery today and request an appointment.  Patient states understanding and is in agreement with this plan  Final Clinical Impressions(s) / ED Diagnoses   Final diagnoses:  Chronic midline low back pain without sciatica    ED Discharge Orders    None       Annett Boxwell, Boyd Kerbs 03/12/18 0554    Palumbo, April, MD 03/12/18 [phone]

## 2018-03-12 NOTE — Discharge Instructions (Addendum)
1. Medications: usual home medications 2. Treatment: rest, drink plenty of fluids, gentle stretching as discussed, alternate ice and heat 3. Follow Up: Please followup with Dr. Wynetta Emeryram ASAP for discussion of your diagnoses and further evaluation after today's visit; if you do not have a primary care doctor use the resource guide provided to find one;  Return to the ER for worsening back pain, difficulty walking, loss of bowel or bladder control or other concerning symptoms

## 2018-03-12 NOTE — ED Triage Notes (Signed)
Pt reports hx of lumbar fusion in August of 2018. Pt states the back pain has become unbearable and there is an issue with getting insurance worked out to get CT scan. Pt has been taking Tramadol for pain.

## 2018-04-13 ENCOUNTER — Other Ambulatory Visit: Payer: Self-pay | Admitting: Student

## 2018-04-13 DIAGNOSIS — M545 Low back pain, unspecified: Secondary | ICD-10-CM

## 2018-04-17 ENCOUNTER — Ambulatory Visit
Admission: RE | Admit: 2018-04-17 | Discharge: 2018-04-17 | Disposition: A | Discharge status: Home / Self Care | Payer: BC Managed Care – PPO | Source: Ambulatory Visit | Attending: Student | Admitting: Student

## 2018-04-17 DIAGNOSIS — M545 Low back pain, unspecified: Secondary | ICD-10-CM

## 2018-09-25 ENCOUNTER — Telehealth: Payer: Self-pay

## 2018-09-25 NOTE — Telephone Encounter (Signed)
   [facility] Pre-operative Risk Assessment    Request for surgical clearance:  1. What type of surgery is being performed? Revision of Spinal Hardware  2. When is this surgery scheduled? TBD  3. What type of clearance is required (medical clearance vs. Pharmacy clearance to hold med vs. Both)? Both  4. Are there any medications that need to be held prior to surgery and how long? Aspirin  5. Practice name and name of physician performing surgery? [facility]  Dr.Gary Cram  6. What is your office phone number [phone]   7.   What is your office fax number [phone]  8.   Anesthesia type General   Neoma Laming 09/25/2018, 2:09 PM  _________________________________________________________________   (provider comments below)

## 2018-09-26 ENCOUNTER — Encounter: Payer: Self-pay | Admitting: Cardiovascular Disease

## 2018-09-26 ENCOUNTER — Ambulatory Visit: Payer: BC Managed Care – PPO | Admitting: Cardiovascular Disease

## 2018-09-26 VITALS — BP 158/86 | HR 85 | Ht 68.0 in | Wt 240.4 lb

## 2018-09-26 DIAGNOSIS — E1169 Type 2 diabetes mellitus with other specified complication: Secondary | ICD-10-CM

## 2018-09-26 DIAGNOSIS — Z6835 Body mass index (BMI) 35.0-35.9, adult: Secondary | ICD-10-CM

## 2018-09-26 DIAGNOSIS — E785 Hyperlipidemia, unspecified: Secondary | ICD-10-CM

## 2018-09-26 DIAGNOSIS — I10 Essential (primary) hypertension: Secondary | ICD-10-CM | POA: Diagnosis not present

## 2018-09-26 DIAGNOSIS — Z01810 Encounter for preprocedural cardiovascular examination: Secondary | ICD-10-CM

## 2018-09-26 DIAGNOSIS — E669 Obesity, unspecified: Secondary | ICD-10-CM

## 2018-09-26 DIAGNOSIS — I2510 Atherosclerotic heart disease of native coronary artery without angina pectoris: Secondary | ICD-10-CM

## 2018-09-26 NOTE — Progress Notes (Signed)
Cardiology Office Note:    Date:  09/29/2018   ID:  Devon Wood, DOB 03-20-1961, MRN [id number]  PCP:  Devon Housekeeper, MD  Cardiologist:  Devon Fair, MD    Referring MD: Devon Housekeeper, MD   No chief complaint on file.  Devon Wood is a 58 y.o. male who is being seen today for the evaluation of perioperative cardiovascular risk at the request of Devon Citrin, MD.  History of Present Illness:    Devon Wood is a 58 y.o. male with a hx of Remote placement of a stent in the LAD artery in 2004 by Devon Wood. He has not had any cardiac events since. Repeat cardiac catheterization in 2011 showed a widely patent stent. He has preserved left ventricular systolic function. A remote echo reported a dilated ascending aorta, but this was not confirmed by chest CT angiography.  He underwent surgery on his spine with Devon Wood in 2018 and this helped a lot, but he subsequently fell on his bathtub and has been open a couple of the screws holding of the hardware together.  He will need surgery again.  He tolerated the previous surgery quite well without any cardiovascular complications.  He has not had angina pectoris since his initial revascularization 15 years ago.  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  He walks 12-15 miles a day and is able to mop and sweep without any angina or dyspnea.  His recent back pain has become a problem.  He has started to develop reduced sensation in his thighs and legs.  He has not had any recent gout attacks.  He denies leg edema intermittent claudication or focal neurological events except described above.  He is compliant with statin for hyperlipidemia and as well controlled diabetes mellitus on oral antidiabetics.  He had a normal electrocardiogram today and I gave him a copy to take to his preop appointment.  Past Medical History:  Diagnosis Date  . Anxiety   . Arthritis   . Bronchitis    hx of;last time couple of yrs ago  . Bronchitis     hx of  . CAD (coronary artery disease) 10/10/2013   2004 2.0 x 25 mm drug-eluting cypher stent to LAD   . Chronic back pain    L3 buldging disc;DDD  . Complication of anesthesia    20-30 second asystole episode during induction  04/2003,  was on b-blocker tx at the time, found to have 80% LAD lesion s/p stent (but unclear if CAD was the direct cause)   . Coronary artery disease 2004   stent placement, [facility] "[facility]" [facility]  . Depression   . Diabetes mellitus    takes Glipizide and Metformin daily  . DM type 2 causing complication (HCC) 10/10/2013  . GERD (gastroesophageal reflux disease)    takes Pepcid daily  . Gout    hx of  . History of kidney stones   . HTN (hypertension) 10/10/2013  . Hypercholesteremia    takes Simvastatin daily  . Hypertension    takes Lisinopril daily  . Joint pain   . Kidney stones    "active ones at present"on the left side  . Mixed hyperlipidemia 10/10/2013  . Morbid obesity (HCC) 10/10/2013  . Nocturia   . Peptic ulcer     Past Surgical History:  Procedure Laterality Date  . BACK SURGERY    . CARDIAC CATHETERIZATION  11/27/2009   widely patent stent  .  COLONOSCOPY WITH PROPOFOL N/A 03/17/2015   Procedure: COLONOSCOPY WITH PROPOFOL;  Surgeon: Charolett BumpersMartin K Johnson, MD;  Location: [facility];  Service: Endoscopy;  Laterality: N/A;  . CORONARY STENT PLACEMENT  05/30/2003   proximal LAD  . ESOPHAGOGASTRODUODENOSCOPY  in the 90's  . HAND SURGERY  1980   right  . LUMBAR DISC SURGERY  2002   L5  . LUMBAR FUSION  2004  . NM MYOCAR PERF WALL MOTION  05/19/2004   no evidence of ischemia  . TONSILLECTOMY  1969  . US ECHOCARDIOGRAPHY  04/30/2003   mildly dilated aortic root,mild LA enlargement,mild concentric LVH,trivial mitral and tricuspid regurg.,mild mitral annular ca+    Current Medications: Current Meds  Medication Sig  . allopurinol (ZYLOPRIM) 100 MG tablet Take 200 mg by mouth daily.    Marland Kitchen. aspirin 81 MG tablet Take 81 mg by mouth  daily.  . colchicine 0.6 MG tablet Take 0.6 mg by mouth daily as needed. For gout flares   . cyclobenzaprine (FLEXERIL) 10 MG tablet Take 10 mg by mouth 3 (three) times daily as needed for muscle spasms.  . cyclobenzaprine (FLEXERIL) 10 MG tablet Take 1 tablet (10 mg total) by mouth 3 (three) times daily as needed for muscle spasms.  Marland Kitchen. escitalopram (LEXAPRO) 10 MG tablet Take 10 mg by mouth daily.  . famotidine (PEPCID) 20 MG tablet Take 20 mg by mouth daily.   Marland Kitchen. glipiZIDE (GLUCOTROL) 5 MG tablet Take 5 mg by mouth 2 (two) times daily before a meal.   . HYDROcodone-acetaminophen (NORCO/VICODIN) 5-325 MG tablet Take 1-2 tablets by mouth every 4 (four) hours as needed (breakthrough pain).  . indomethacin (INDOCIN) 25 MG capsule Take 25 mg by mouth 2 (two) times daily as needed. For gout flares  . Krill Oil 300 MG CAPS Take 1 capsule by mouth daily.  Marland Kitchen. lisinopril-hydrochlorothiazide (PRINZIDE,ZESTORETIC) 20-12.5 MG per tablet Take 1 tablet by mouth daily.  . meloxicam (MOBIC) 15 MG tablet Take 15 mg by mouth daily.  . metFORMIN (GLUCOPHAGE) 1000 MG tablet Take 1,000 mg by mouth daily with breakfast.   . Potassium 99 MG TABS Take 1 tablet by mouth daily.  . simvastatin (ZOCOR) 40 MG tablet Take 20 mg by mouth daily.    . sitaGLIPtin (JANUVIA) 100 MG tablet Take 100 mg by mouth daily.     Allergies:   Erythromycin   Social History   Socioeconomic History  . Marital status: Married    Spouse name: Not on file  . Number of children: Not on file  . Years of education: Not on file  . Highest education level: Not on file  Occupational History  . Not on file  Social Needs  . Financial resource strain: Not on file  . Food insecurity:    Worry: Not on file    Inability: Not on file  . Transportation needs:    Medical: Not on file    Non-medical: Not on file  Tobacco Use  . Smoking status: Former Smoker    Packs/day: 0.50    Years: 4.00    Pack years: 2.00    Types: Cigarettes    Last  attempt to quit: 1984    Years since quitting: 36.0  . Smokeless tobacco: Current User    Types: Chew  Substance and Sexual Activity  . Alcohol use: Yes    Comment: occasionally  . Drug use: No  . Sexual activity: Yes  Lifestyle  . Physical activity:    Days per week:  Not on file    Minutes per session: Not on file  . Stress: Not on file  Relationships  . Social connections:    Talks on phone: Not on file    Gets together: Not on file    Attends religious service: Not on file    Active member of club or organization: Not on file    Attends meetings of clubs or organizations: Not on file    Relationship status: Not on file  Other Topics Concern  . Not on file  Social History Narrative  . Not on file     Family History: The patient's family history includes COPD in his brother, brother, and mother; Cancer in his maternal grandfather; Dementia in his maternal grandmother; Heart attack in his father and paternal grandfather; Heart failure in his paternal grandmother; Hypertension in his sister. There is no history of Anesthesia problems, Hypotension, Malignant hyperthermia, or Pseudochol deficiency. ROS:   Please see the history of present illness.     All other systems reviewed and are negative.  EKGs/Labs/Other Studies Reviewed:    The following studies were reviewed today: Notes from neurosurgery . EKG:  EKG is ordered today.  It is a completely normal tracing  Recent Labs: No results found for requested labs within last 8760 hours.  Recent Lipid Panel    Component Value Date/Time   CHOL  11/27/2009 1631    110        ATP III CLASSIFICATION:  <200     mg/dL   Desirable  951-884  mg/dL   Borderline High  >=166    mg/dL   High          TRIG 063 (H) 11/27/2009 1631   HDL 31 (L) 11/27/2009 1631   CHOLHDL 3.5 11/27/2009 1631   VLDL 38 11/27/2009 1631   LDLCALC  11/27/2009 1631    41        Total Cholesterol/HDL:CHD Risk Coronary Heart Disease Risk Table                      Men   Women  1/2 Average Risk   3.4   3.3  Average Risk       5.0   4.4  2 X Average Risk   9.6   7.1  3 X Average Risk  23.4   11.0        Use the calculated Patient Ratio above and the CHD Risk Table to determine the patient's CHD Risk.        ATP III CLASSIFICATION (LDL):  <100     mg/dL   Optimal  016-010  mg/dL   Near or Above                    Optimal  130-159  mg/dL   Borderline  932-355  mg/dL   High  >732     mg/dL   Very High    Physical Exam:    VS:  BP (!) 158/86   Pulse 85   Ht 5\' 8"  (1.727 m)   Wt 240 lb 6.4 oz (109 kg)   BMI 36.55 kg/m     Wt Readings from Last 3 Encounters:  09/26/18 240 lb 6.4 oz (109 kg)  03/12/18 230 lb (104.3 kg)  04/05/17 237 lb (107.5 kg)      General: Alert, oriented x3, no distress, obese Head: no evidence of trauma, PERRL, EOMI, no exophtalmos or lid lag, no myxedema,  no xanthelasma; normal ears, nose and oropharynx Neck: normal jugular venous pulsations and no hepatojugular reflux; brisk carotid pulses without delay and no carotid bruits Chest: clear to auscultation, no signs of consolidation by percussion or palpation, normal fremitus, symmetrical and full respiratory excursions Cardiovascular: normal position and quality of the apical impulse, regular rhythm, normal first and second heart sounds, no murmurs, rubs or gallops Abdomen: no tenderness or distention, no masses by palpation, no abnormal pulsatility or arterial bruits, normal bowel sounds, no hepatosplenomegaly Extremities: no clubbing, cyanosis or edema; 2+ radial, ulnar and brachial pulses bilaterally; 2+ right femoral, posterior tibial and dorsalis pedis pulses; 2+ left femoral, posterior tibial and dorsalis pedis pulses; no subclavian or femoral bruits Neurological: grossly nonfocal Psych: Normal mood and affect   ASSESSMENT:    1. Coronary artery disease involving native coronary artery of native heart without angina pectoris   2. Diabetes mellitus  type 2 in obese (HCC)   3. Essential hypertension   4. Dyslipidemia (high LDL; low HDL)   5. Severe obesity (BMI 35.0-35.9 with comorbidity) (HCC)   6. Pre-operative cardiovascular examination    PLAN:    In order of problems listed above:  1. CAD: Asymptomatic despite a very physically active job. 2. DM: Good control. 3. HTN: Elevated today, likely due to back pain.  He reports that he usually has blood pressures in the 120s.  No changes made to medications.  He is not on beta-blockers. 4. HLP: On statin.  Labs followed by PCP. 5. Obesity: He reports that he was losing weight until he reinjured his back. 6. Low risk of major cardiovascular complications with planned lumbar spine surgery.  I do not think he would benefit from additional cardiac evaluation or any change in current therapeutic plan before his back surgery.  Aspirin can be stopped in anticipation of surgery.   Medication Adjustments/Labs and Tests Ordered: Current medicines are reviewed at length with the patient today.  Concerns regarding medicines are outlined above.  Orders Placed This Encounter  Procedures  . EKG 12-Lead   No orders of the defined types were placed in this encounter.   Signed, Devon FairMihai Anneta Rounds, MD  09/29/2018 2:37 PM    [facility]

## 2018-09-26 NOTE — Patient Instructions (Signed)
Medication Instructions:  Your physician recommends that you continue on your current medications as directed. Please refer to the Current Medication list given to you today.  If you need a refill on your cardiac medications before your next appointment, please call your pharmacy.   Lab work: None  If you have labs (blood work) drawn today and your tests are completely normal, you will receive your results only by: Marland Kitchen. MyChart Message (if you have MyChart) OR . A paper copy in the mail If you have any lab test that is abnormal or we need to change your treatment, we will call you to review the results.  Testing/Procedures: None ordered  Follow-Up: At [facility], you and your health needs are our priority.  As part of our continuing mission to provide you with exceptional heart care, we have created designated Provider Care Teams.  These Care Teams include your primary Cardiologist (physician) and Advanced Practice Providers (APPs -  Physician Assistants and Nurse Practitioners) who all work together to provide you with the care you need, when you need it. You will need a follow up appointment in 12 months.  Please call our office 2 months in advance to schedule this appointment.  You may see  Dr.Croitoru or one of the following Advanced Practice Providers on your designated Care Team: Azalee CourseHao Meng, [place] JerseyPA-C . Micah FlesherAngela Duke, PA-C  Any Other Special Instructions Will Be Listed Below (If Applicable). You have been cleared for your upcoming surgery. A letter of clearance has been sent to the Surgeon.

## 2018-09-27 NOTE — Telephone Encounter (Signed)
   Primary Cardiologist: Dr Royann Shiversroitoru  Chart reviewed as part of pre-operative protocol coverage. Given past medical history and time since last visit, based on ACC/AHA guidelines, Devon Wood would be at acceptable risk for the planned procedure without further cardiovascular testing.   OK to hold aspirin 3-5 days pre op if needed.  I will route this recommendation to the requesting party via Epic fax function and remove from pre-op pool.  Please call with questions.  Corine ShelterLuke Hargis Vandyne, PA-C 09/27/2018, 3:19 PM

## 2018-09-29 ENCOUNTER — Encounter: Payer: Self-pay | Admitting: Cardiovascular Disease

## 2018-09-29 DIAGNOSIS — E785 Hyperlipidemia, unspecified: Secondary | ICD-10-CM | POA: Insufficient documentation

## 2018-10-01 ENCOUNTER — Other Ambulatory Visit: Payer: Self-pay | Admitting: Neurosurgery

## 2018-10-01 DIAGNOSIS — M5126 Other intervertebral disc displacement, lumbar region: Secondary | ICD-10-CM

## 2018-10-03 ENCOUNTER — Ambulatory Visit
Admission: RE | Admit: 2018-10-03 | Discharge: 2018-10-03 | Disposition: A | Discharge status: Home / Self Care | Payer: BC Managed Care – PPO | Source: Ambulatory Visit | Attending: Neurosurgery | Admitting: Neurosurgery

## 2018-10-03 DIAGNOSIS — M5126 Other intervertebral disc displacement, lumbar region: Secondary | ICD-10-CM

## 2018-10-04 ENCOUNTER — Other Ambulatory Visit: Payer: Self-pay | Admitting: Neurosurgery

## 2018-10-16 ENCOUNTER — Inpatient Hospital Stay (HOSPITAL_COMMUNITY): Admission: RE | Admit: 2018-10-16 | Payer: Self-pay | Source: Ambulatory Visit

## 2018-10-18 NOTE — Pre-Procedure Instructions (Signed)
Devon Wood  10/18/2018      [id number], [place] - [address] [place] [address] Phone: [phone] Fax: [phone]    Your procedure is scheduled on February 19th, 2020.  Report to [facility] Admitting at 10:00 A.M.  Call this number if you have problems the morning of surgery:  [phone]   Remember:  Do not eat or drink after midnight.   Take these medicines the morning of surgery with A SIP OF WATER: allopurinol, colchicine, lexapro, pepcid, vicodin as needed, and  flonase nasal spray.    Do not wear jewelry, make-up or nail polish.  Do not wear lotions, powders, or perfumes, or deodorant.  Do not shave 48 hours prior to surgery.  Men may shave face and neck.  Do not bring valuables to the hospital.  [facility] is not responsible for any belongings or valuables.  Contacts, dentures or bridgework may not be worn into surgery.  Leave your suitcase in the car.  After surgery it may be brought to your room.  For patients admitted to the hospital, discharge time will be determined by your treatment team.  Patients discharged the day of surgery will not be allowed to drive home.    [place]- Preparing For Surgery  Before surgery, you can play an important role. Because skin is not sterile, your skin needs to be as free of germs as possible. You can reduce the number of germs on your skin by washing with CHG (chlorahexidine gluconate) Soap before surgery.  CHG is an antiseptic cleaner which kills germs and bonds with the skin to continue killing germs even after washing.    Oral Hygiene is also important to reduce your risk of infection.  Remember - BRUSH YOUR TEETH THE MORNING OF SURGERY WITH YOUR REGULAR TOOTHPASTE  Please do not use if you have an allergy to CHG or antibacterial soaps. If your skin becomes reddened/irritated stop using the CHG.  Do not shave (including legs and underarms) for at  least 48 hours prior to first CHG shower. It is OK to shave your face.  Please follow these instructions carefully.   1. Shower the NIGHT BEFORE SURGERY and the MORNING OF SURGERY with CHG.   2. If you chose to wash your hair, wash your hair first as usual with your normal shampoo.  3. After you shampoo, rinse your hair and body thoroughly to remove the shampoo.  4. Use CHG as you would any other liquid soap. You can apply CHG directly to the skin and wash gently with a scrungie or a clean washcloth.   5. Apply the CHG Soap to your body ONLY FROM THE NECK DOWN.  Do not use on open wounds or open sores. Avoid contact with your eyes, ears, mouth and genitals (private parts). Wash Face and genitals (private parts)  with your normal soap.  6. Wash thoroughly, paying special attention to the area where your surgery will be performed.  7. Thoroughly rinse your body with warm water from the neck down.  8. DO NOT shower/wash with your normal soap after using and rinsing off the CHG Soap.  9. Pat yourself dry with a CLEAN TOWEL.  10. Wear CLEAN PAJAMAS to bed the night before surgery, wear comfortable clothes the morning of surgery  11. Place CLEAN SHEETS on your bed the night of your first shower and DO NOT SLEEP WITH PETS.  Day of Surgery:  Do not apply any deodorants/lotions.  Please wear clean clothes to the hospital/surgery center.   Remember to brush your teeth WITH YOUR REGULAR TOOTHPASTE.

## 2018-10-19 ENCOUNTER — Other Ambulatory Visit: Payer: Self-pay

## 2018-10-19 ENCOUNTER — Other Ambulatory Visit: Payer: Self-pay | Admitting: Neurosurgery

## 2018-10-19 ENCOUNTER — Encounter (HOSPITAL_COMMUNITY): Payer: Self-pay

## 2018-10-19 ENCOUNTER — Encounter (HOSPITAL_COMMUNITY)
Admission: RE | Admit: 2018-10-19 | Discharge: 2018-10-19 | Disposition: A | Discharge status: Home / Self Care | Payer: No Typology Code available for payment source | Source: Ambulatory Visit | Attending: Neurosurgery | Admitting: Neurosurgery

## 2018-10-19 DIAGNOSIS — Z01812 Encounter for preprocedural laboratory examination: Secondary | ICD-10-CM | POA: Diagnosis present

## 2018-10-19 LAB — CBC
HCT: 38.3 % — ABNORMAL LOW (ref 39.0–52.0)
Hemoglobin: 12.8 g/dL — ABNORMAL LOW (ref 13.0–17.0)
MCH: 32.3 pg (ref 26.0–34.0)
MCHC: 33.4 g/dL (ref 30.0–36.0)
MCV: 96.7 fL (ref 80.0–100.0)
NRBC: 0 % (ref 0.0–0.2)
PLATELETS: 189 10*3/uL (ref 150–400)
RBC: 3.96 MIL/uL — ABNORMAL LOW (ref 4.22–5.81)
RDW: 11.9 % (ref 11.5–15.5)
WBC: 6.9 10*3/uL (ref 4.0–10.5)

## 2018-10-19 LAB — BASIC METABOLIC PANEL
Anion gap: 10 (ref 5–15)
BUN: 23 mg/dL — AB (ref 6–20)
CALCIUM: 9 mg/dL (ref 8.9–10.3)
CO2: 25 mmol/L (ref 22–32)
Chloride: 104 mmol/L (ref 98–111)
Creatinine, Ser: 1.26 mg/dL — ABNORMAL HIGH (ref 0.61–1.24)
GFR calc non Af Amer: >60 mL/min (ref >60)
Glucose, Bld: 164 mg/dL — ABNORMAL HIGH (ref 70–99)
Potassium: 3.9 mmol/L (ref 3.5–5.1)
SODIUM: 139 mmol/L (ref 135–145)

## 2018-10-19 LAB — SURGICAL PCR SCREEN
MRSA, PCR: NEGATIVE
Staphylococcus aureus: NEGATIVE

## 2018-10-19 LAB — GLUCOSE, CAPILLARY: GLUCOSE-CAPILLARY: 164 mg/dL — AB (ref 70–99)

## 2018-10-19 LAB — HEMOGLOBIN A1C
Hgb A1c MFr Bld: 5.8 % — ABNORMAL HIGH (ref 4.8–5.6)
Mean Plasma Glucose: 119.76 mg/dL

## 2018-10-19 LAB — TYPE AND SCREEN
ABO/RH(D): A POS
Antibody Screen: NEGATIVE

## 2018-10-19 NOTE — Progress Notes (Signed)
PCP - Georgann Housekeeper Cardiologist - Croitoru  Chest x-ray - N/A EKG - 09-26-18 Cardiac Cath - 11-27-09  DM - type 2 Fasting Blood Sugar - 117-120  Aspirin Instructions: Pt stated he already stopped his ASA  Anesthesia review: yes, heart history  Patient denies shortness of breath, fever, cough and chest pain at PAT appointment   Patient verbalized understanding of instructions that were given to them at the PAT appointment. Patient was also instructed that they will need to review over the PAT instructions again at home before surgery.

## 2018-10-19 NOTE — Progress Notes (Signed)
At time of pre-op appointment, consent was missing location of surgery.  Called and spoke with Erie Noe at Dr. Lonie Peak office.  Stated she would update the consent.  Need to sign consent DOS.

## 2018-10-19 NOTE — Pre-Procedure Instructions (Signed)
Belton Altamira Gorgas  10/19/2018      [id number], [place] - [address] [place] [address] Phone: [phone] Fax: [phone]    Your procedure is scheduled on February 19th, 2020.  Report to [facility] Entrance "A" at 10:00 A.M.  Call this number if you have problems the morning of surgery:  [phone]   Remember:  Do not eat or drink after midnight.   Take these medicines the morning of surgery with A SIP OF WATER:   Allopurinol  Colchicine  Lexapro  Pepcid  Vicodin as needed  Flonase nasal spray  Follow your surgeon's instructions on when to stop Asprin.  If no instructions were given by your surgeon then you will need to call the office to get those instructions.    7 days prior to surgery STOP taking any Aspirin (unless otherwise instructed by your surgeon), Aleve, Naproxen, Ibuprofen, Motrin, Advil, Goody's, BC's, all herbal medications, fish oil, and all vitamins.   WHAT DO I DO ABOUT MY DIABETES MEDICATION?   Marland Kitchen Do not take oral diabetes medicines (pills) the morning of surgery - Metformin, Glipizide, Januvia   How to Manage Your Diabetes Before and After Surgery  Why is it important to control my blood sugar before and after surgery? . Improving blood sugar levels before and after surgery helps healing and can limit problems. . A way of improving blood sugar control is eating a healthy diet by: o  Eating less sugar and carbohydrates o  Increasing activity/exercise o  Talking with your doctor about reaching your blood sugar goals . High blood sugars (greater than 180 mg/dL) can raise your risk of infections and slow your recovery, so you will need to focus on controlling your diabetes during the weeks before surgery. . Make sure that the doctor who takes care of your diabetes knows about your planned surgery including the date and location.  How do I manage my blood sugar before surgery? . Check your blood  sugar at least 4 times a day, starting 2 days before surgery, to make sure that the level is not too high or low. o Check your blood sugar the morning of your surgery when you wake up and every 2 hours until you get to the Short Stay unit. . If your blood sugar is less than 70 mg/dL, you will need to treat for low blood sugar: o Do not take insulin. o Treat a low blood sugar (less than 70 mg/dL) with  cup of clear juice (cranberry or apple), 4 glucose tablets, OR glucose gel. o Recheck blood sugar in 15 minutes after treatment (to make sure it is greater than 70 mg/dL). If your blood sugar is not greater than 70 mg/dL on recheck, call [phone] for further instructions. . Report your blood sugar to the short stay nurse when you get to Short Stay.  . If you are admitted to the hospital after surgery: o Your blood sugar will be checked by the staff and you will probably be given insulin after surgery (instead of oral diabetes medicines) to make sure you have good blood sugar levels. o The goal for blood sugar control after surgery is 80-180 mg/dL.     Do not wear jewelry.  Do not wear lotions, powders, or perfumes, or deodorant.  Do not shave 48 hours prior to surgery.  Men may shave face and neck.  Do not bring valuables to the hospital.  [facility]  is not responsible for any belongings or valuables.  Contacts, dentures or bridgework may not be worn into surgery.  Leave your suitcase in the car.  After surgery it may be brought to your room.  For patients admitted to the hospital, discharge time will be determined by your treatment team.  Patients discharged the day of surgery will not be allowed to drive home.    [place]- Preparing For Surgery  Before surgery, you can play an important role. Because skin is not sterile, your skin needs to be as free of germs as possible. You can reduce the number of germs on your skin by washing with CHG (chlorahexidine gluconate) Soap before  surgery.  CHG is an antiseptic cleaner which kills germs and bonds with the skin to continue killing germs even after washing.    Oral Hygiene is also important to reduce your risk of infection.  Remember - BRUSH YOUR TEETH THE MORNING OF SURGERY WITH YOUR REGULAR TOOTHPASTE  Please do not use if you have an allergy to CHG or antibacterial soaps. If your skin becomes reddened/irritated stop using the CHG.  Do not shave (including legs and underarms) for at least 48 hours prior to first CHG shower. It is OK to shave your face.  Please follow these instructions carefully.   1. Shower the NIGHT BEFORE SURGERY and the MORNING OF SURGERY with CHG.   2. If you chose to wash your hair, wash your hair first as usual with your normal shampoo.  3. After you shampoo, rinse your hair and body thoroughly to remove the shampoo.  4. Use CHG as you would any other liquid soap. You can apply CHG directly to the skin and wash gently with a scrungie or a clean washcloth.   5. Apply the CHG Soap to your body ONLY FROM THE NECK DOWN.  Do not use on open wounds or open sores. Avoid contact with your eyes, ears, mouth and genitals (private parts). Wash Face and genitals (private parts)  with your normal soap.  6. Wash thoroughly, paying special attention to the area where your surgery will be performed.  7. Thoroughly rinse your body with warm water from the neck down.  8. DO NOT shower/wash with your normal soap after using and rinsing off the CHG Soap.  9. Pat yourself dry with a CLEAN TOWEL.  10. Wear CLEAN PAJAMAS to bed the night before surgery, wear comfortable clothes the morning of surgery  11. Place CLEAN SHEETS on your bed the night of your first shower and DO NOT SLEEP WITH PETS.   Day of Surgery:  Do not apply any deodorants/lotions.  Please wear clean clothes to the hospital/surgery center.   Remember to brush your teeth WITH YOUR REGULAR TOOTHPASTE.

## 2018-10-22 NOTE — Anesthesia Preprocedure Evaluation (Addendum)
Anesthesia Evaluation  Patient identified by MRN, date of birth, ID band Patient awake    Reviewed: Allergy & Precautions, NPO status , Patient's Chart, lab work & pertinent test results  Airway Mallampati: III  TM Distance: >3 FB Neck ROM: Full    Dental no notable dental hx.    Pulmonary former smoker,    Pulmonary exam normal breath sounds clear to auscultation       Cardiovascular hypertension, Pt. on medications + CAD and + Cardiac Stents (x 1)  Normal cardiovascular exam Rhythm:Regular Rate:Normal  ECG: NSR, rate 85  Sees cardiologist (Dr. C)   Neuro/Psych PSYCHIATRIC DISORDERS Anxiety Depression negative neurological ROS     GI/Hepatic PUD, GERD  Medicated and Controlled,(+)     substance abuse  ,   Endo/Other  diabetes, Oral Hypoglycemic Agents  Renal/GU      Musculoskeletal  (+) narcotic dependentChronic back pain   Abdominal (+) + obese,   Peds  Hematology  (+) anemia , Gout HLD   Anesthesia Other Findings Hardware failure  Reproductive/Obstetrics                           Anesthesia Physical Anesthesia Plan  ASA: II  Anesthesia Plan: General   Post-op Pain Management:    Induction: Intravenous  PONV Risk Score and Plan: 2 and Ondansetron, Dexamethasone and Midazolam  Airway Management Planned: Oral ETT  Additional Equipment:   Intra-op Plan:   Post-operative Plan: Extubation in OR  Informed Consent: I have reviewed the patients History and Physical, chart, labs and discussed the procedure including the risks, benefits and alternatives for the proposed anesthesia with the patient or authorized representative who has indicated his/her understanding and acceptance.     Dental advisory given  Plan Discussed with: CRNA  Anesthesia Plan Comments: (Follows with cardiology for hx of Remote sent to LAD 2004. Repeat cardiac catheterization in 2011 showed a widely  patent stent. Per cardiology notes he works a very physical job and is asymptomatic. Cleared by Dr. Royann Shivers 09/26/18 "Low risk of major cardiovascular complications with planned lumbar spine surgery.  I do not think he would benefit from additional cardiac evaluation or any change in current therapeutic plan before his back surgery.  Aspirin can be stopped in anticipation of surgery.")       Anesthesia Quick Evaluation

## 2018-10-24 ENCOUNTER — Inpatient Hospital Stay (HOSPITAL_COMMUNITY)
Admission: RE | Admit: 2018-10-24 | Discharge: 2018-10-24 | DRG: 497 | Disposition: A | Discharge status: Home / Self Care | Payer: No Typology Code available for payment source | Attending: Neurosurgery | Admitting: Neurosurgery

## 2018-10-24 ENCOUNTER — Inpatient Hospital Stay (HOSPITAL_COMMUNITY): Payer: No Typology Code available for payment source | Admitting: Vascular Surgery

## 2018-10-24 ENCOUNTER — Encounter (HOSPITAL_COMMUNITY)
Admission: RE | Disposition: A | Discharge status: Home / Self Care | Payer: Self-pay | Source: Home / Self Care | Attending: Neurosurgery

## 2018-10-24 ENCOUNTER — Inpatient Hospital Stay (HOSPITAL_COMMUNITY): Payer: No Typology Code available for payment source | Admitting: Certified Registered Nurse Anesthetist

## 2018-10-24 ENCOUNTER — Other Ambulatory Visit: Payer: Self-pay

## 2018-10-24 ENCOUNTER — Encounter (HOSPITAL_COMMUNITY): Payer: Self-pay

## 2018-10-24 DIAGNOSIS — K219 Gastro-esophageal reflux disease without esophagitis: Secondary | ICD-10-CM | POA: Diagnosis present

## 2018-10-24 DIAGNOSIS — Z87442 Personal history of urinary calculi: Secondary | ICD-10-CM | POA: Diagnosis not present

## 2018-10-24 DIAGNOSIS — Z7982 Long term (current) use of aspirin: Secondary | ICD-10-CM | POA: Diagnosis not present

## 2018-10-24 DIAGNOSIS — F419 Anxiety disorder, unspecified: Secondary | ICD-10-CM | POA: Diagnosis present

## 2018-10-24 DIAGNOSIS — Z8249 Family history of ischemic heart disease and other diseases of the circulatory system: Secondary | ICD-10-CM | POA: Diagnosis not present

## 2018-10-24 DIAGNOSIS — T84216A Breakdown (mechanical) of internal fixation device of vertebrae, initial encounter: Secondary | ICD-10-CM | POA: Diagnosis present

## 2018-10-24 DIAGNOSIS — T8484XA Pain due to internal orthopedic prosthetic devices, implants and grafts, initial encounter: Secondary | ICD-10-CM | POA: Diagnosis present

## 2018-10-24 DIAGNOSIS — Z981 Arthrodesis status: Secondary | ICD-10-CM | POA: Diagnosis not present

## 2018-10-24 DIAGNOSIS — E119 Type 2 diabetes mellitus without complications: Secondary | ICD-10-CM | POA: Diagnosis present

## 2018-10-24 DIAGNOSIS — I2510 Atherosclerotic heart disease of native coronary artery without angina pectoris: Secondary | ICD-10-CM | POA: Diagnosis present

## 2018-10-24 DIAGNOSIS — G8929 Other chronic pain: Secondary | ICD-10-CM | POA: Diagnosis present

## 2018-10-24 DIAGNOSIS — E782 Mixed hyperlipidemia: Secondary | ICD-10-CM | POA: Diagnosis present

## 2018-10-24 DIAGNOSIS — Z955 Presence of coronary angioplasty implant and graft: Secondary | ICD-10-CM

## 2018-10-24 DIAGNOSIS — M109 Gout, unspecified: Secondary | ICD-10-CM | POA: Diagnosis present

## 2018-10-24 DIAGNOSIS — M5136 Other intervertebral disc degeneration, lumbar region: Secondary | ICD-10-CM | POA: Diagnosis present

## 2018-10-24 DIAGNOSIS — F17220 Nicotine dependence, chewing tobacco, uncomplicated: Secondary | ICD-10-CM | POA: Diagnosis present

## 2018-10-24 DIAGNOSIS — Z825 Family history of asthma and other chronic lower respiratory diseases: Secondary | ICD-10-CM | POA: Diagnosis not present

## 2018-10-24 DIAGNOSIS — Y792 Prosthetic and other implants, materials and accessory orthopedic devices associated with adverse incidents: Secondary | ICD-10-CM | POA: Diagnosis present

## 2018-10-24 DIAGNOSIS — F329 Major depressive disorder, single episode, unspecified: Secondary | ICD-10-CM | POA: Diagnosis present

## 2018-10-24 DIAGNOSIS — Z8674 Personal history of sudden cardiac arrest: Secondary | ICD-10-CM | POA: Diagnosis not present

## 2018-10-24 DIAGNOSIS — Z79899 Other long term (current) drug therapy: Secondary | ICD-10-CM | POA: Diagnosis not present

## 2018-10-24 DIAGNOSIS — Z7984 Long term (current) use of oral hypoglycemic drugs: Secondary | ICD-10-CM | POA: Diagnosis not present

## 2018-10-24 DIAGNOSIS — I10 Essential (primary) hypertension: Secondary | ICD-10-CM | POA: Diagnosis present

## 2018-10-24 LAB — GLUCOSE, CAPILLARY
GLUCOSE-CAPILLARY: 251 mg/dL — AB (ref 70–99)
Glucose-Capillary: 143 mg/dL — ABNORMAL HIGH (ref 70–99)
Glucose-Capillary: 146 mg/dL — ABNORMAL HIGH (ref 70–99)

## 2018-10-24 SURGERY — POSTERIOR LUMBAR FUSION 1 WITH HARDWARE REMOVAL
Anesthesia: General | Site: Back

## 2018-10-24 MED ORDER — SUGAMMADEX SODIUM 200 MG/2ML IV SOLN
INTRAVENOUS | Status: DC | PRN
  Administered 2018-10-24: 400 mg via INTRAVENOUS

## 2018-10-24 MED ORDER — BUPIVACAINE LIPOSOME 1.3 % IJ SUSP
20.0000 mL | INTRAMUSCULAR | Status: DC
  Filled 2018-10-24: qty 20

## 2018-10-24 MED ORDER — MELOXICAM 7.5 MG PO TABS
15.0000 mg | ORAL_TABLET | Freq: Every day | ORAL | Status: DC
  Administered 2018-10-24: 15 mg via ORAL
  Filled 2018-10-24: qty 2

## 2018-10-24 MED ORDER — POTASSIUM 99 MG PO TABS: 1.0000 | ORAL_TABLET | Freq: Every day | ORAL | Status: DC

## 2018-10-24 MED ORDER — PANTOPRAZOLE SODIUM 40 MG IV SOLR: 40.0000 mg | Freq: Every day | INTRAVENOUS | Status: DC

## 2018-10-24 MED ORDER — HYDROMORPHONE HCL 1 MG/ML IJ SOLN: 0.5000 mg | INTRAMUSCULAR | Status: DC | PRN

## 2018-10-24 MED ORDER — ROCURONIUM BROMIDE 50 MG/5ML IV SOSY
PREFILLED_SYRINGE | INTRAVENOUS | Status: DC | PRN
  Administered 2018-10-24: 50 mg via INTRAVENOUS
  Administered 2018-10-24: 20 mg via INTRAVENOUS

## 2018-10-24 MED ORDER — CHLORHEXIDINE GLUCONATE CLOTH 2 % EX PADS: 6.0000 | MEDICATED_PAD | Freq: Once | CUTANEOUS | Status: DC

## 2018-10-24 MED ORDER — ESCITALOPRAM OXALATE 10 MG PO TABS: 10.0000 mg | ORAL_TABLET | Freq: Every day | ORAL | Status: DC

## 2018-10-24 MED ORDER — SIMVASTATIN 20 MG PO TABS
20.0000 mg | ORAL_TABLET | Freq: Every day | ORAL | Status: DC
  Administered 2018-10-24: 20 mg via ORAL
  Filled 2018-10-24: qty 1

## 2018-10-24 MED ORDER — COLCHICINE 0.6 MG PO TABS
0.6000 mg | ORAL_TABLET | Freq: Every day | ORAL | Status: DC | PRN
  Filled 2018-10-24: qty 1

## 2018-10-24 MED ORDER — DEXAMETHASONE SODIUM PHOSPHATE 10 MG/ML IJ SOLN
INTRAMUSCULAR | Status: DC | PRN
  Administered 2018-10-24: 10 mg via INTRAVENOUS

## 2018-10-24 MED ORDER — HYDROCODONE-ACETAMINOPHEN 5-325 MG PO TABS: 1.0000 | ORAL_TABLET | ORAL | 0 refills | Status: DC | PRN

## 2018-10-24 MED ORDER — METFORMIN HCL 500 MG PO TABS
1000.0000 mg | ORAL_TABLET | Freq: Two times a day (BID) | ORAL | Status: DC
  Administered 2018-10-24: 1000 mg via ORAL
  Filled 2018-10-24: qty 2

## 2018-10-24 MED ORDER — KETAMINE HCL 10 MG/ML IJ SOLN
INTRAMUSCULAR | Status: DC | PRN
  Administered 2018-10-24: 30 mg via INTRAVENOUS
  Administered 2018-10-24: 20 mg via INTRAVENOUS

## 2018-10-24 MED ORDER — SODIUM CHLORIDE 0.9 % IV SOLN
INTRAVENOUS | Status: DC | PRN
  Administered 2018-10-24: 500 mL

## 2018-10-24 MED ORDER — CYCLOBENZAPRINE HCL 10 MG PO TABS: 10.0000 mg | ORAL_TABLET | Freq: Three times a day (TID) | ORAL | Status: DC | PRN

## 2018-10-24 MED ORDER — SODIUM CHLORIDE 0.9 % IV SOLN: 250.0000 mL | INTRAVENOUS | Status: DC

## 2018-10-24 MED ORDER — PHENOL 1.4 % MT LIQD: 1.0000 | OROMUCOSAL | Status: DC | PRN

## 2018-10-24 MED ORDER — FAMOTIDINE 20 MG PO TABS: 20.0000 mg | ORAL_TABLET | Freq: Every day | ORAL | Status: DC

## 2018-10-24 MED ORDER — CEFAZOLIN SODIUM-DEXTROSE 2-4 GM/100ML-% IV SOLN
2.0000 g | INTRAVENOUS | Status: AC
  Administered 2018-10-24: 2 g via INTRAVENOUS

## 2018-10-24 MED ORDER — GLIPIZIDE 5 MG PO TABS
5.0000 mg | ORAL_TABLET | Freq: Every day | ORAL | Status: DC
  Administered 2018-10-24: 5 mg via ORAL
  Filled 2018-10-24: qty 1

## 2018-10-24 MED ORDER — BUPIVACAINE HCL (PF) 0.25 % IJ SOLN
INTRAMUSCULAR | Status: AC
  Filled 2018-10-24: qty 30

## 2018-10-24 MED ORDER — SUGAMMADEX SODIUM 500 MG/5ML IV SOLN
INTRAVENOUS | Status: AC
  Filled 2018-10-24: qty 5

## 2018-10-24 MED ORDER — SODIUM CHLORIDE 0.9% FLUSH: 3.0000 mL | INTRAVENOUS | Status: DC | PRN

## 2018-10-24 MED ORDER — ALLOPURINOL 100 MG PO TABS: 100.0000 mg | ORAL_TABLET | Freq: Every day | ORAL | Status: DC

## 2018-10-24 MED ORDER — OMEGA-3-ACID ETHYL ESTERS 1 G PO CAPS
1.0000 | ORAL_CAPSULE | Freq: Every day | ORAL | Status: DC
  Administered 2018-10-24: 1 g via ORAL
  Filled 2018-10-24: qty 1

## 2018-10-24 MED ORDER — CYCLOBENZAPRINE HCL 10 MG PO TABS
10.0000 mg | ORAL_TABLET | Freq: Three times a day (TID) | ORAL | Status: DC | PRN
  Administered 2018-10-24: 10 mg via ORAL
  Filled 2018-10-24: qty 1

## 2018-10-24 MED ORDER — SODIUM CHLORIDE 0.9 % IV SOLN
INTRAVENOUS | Status: DC | PRN
  Administered 2018-10-24: 40 ug/min via INTRAVENOUS

## 2018-10-24 MED ORDER — DEXAMETHASONE SODIUM PHOSPHATE 10 MG/ML IJ SOLN
INTRAMUSCULAR | Status: AC
  Filled 2018-10-24: qty 1

## 2018-10-24 MED ORDER — CEFAZOLIN SODIUM-DEXTROSE 2-4 GM/100ML-% IV SOLN: 2.0000 g | Freq: Three times a day (TID) | INTRAVENOUS | Status: DC

## 2018-10-24 MED ORDER — OXYCODONE HCL 5 MG PO TABS: 5.0000 mg | ORAL_TABLET | Freq: Once | ORAL | Status: DC | PRN

## 2018-10-24 MED ORDER — ESMOLOL HCL 100 MG/10ML IV SOLN
INTRAVENOUS | Status: AC
  Filled 2018-10-24: qty 10

## 2018-10-24 MED ORDER — ACETAMINOPHEN 650 MG RE SUPP: 650.0000 mg | RECTAL | Status: DC | PRN

## 2018-10-24 MED ORDER — MENTHOL 3 MG MT LOZG: 1.0000 | LOZENGE | OROMUCOSAL | Status: DC | PRN

## 2018-10-24 MED ORDER — ROCURONIUM BROMIDE 50 MG/5ML IV SOSY
PREFILLED_SYRINGE | INTRAVENOUS | Status: AC
  Filled 2018-10-24: qty 5

## 2018-10-24 MED ORDER — PROPOFOL 10 MG/ML IV BOLUS
INTRAVENOUS | Status: DC | PRN
  Administered 2018-10-24: 200 mg via INTRAVENOUS

## 2018-10-24 MED ORDER — FENTANYL CITRATE (PF) 100 MCG/2ML IJ SOLN
INTRAMUSCULAR | Status: DC | PRN
  Administered 2018-10-24: 50 ug via INTRAVENOUS
  Administered 2018-10-24: 25 ug via INTRAVENOUS
  Administered 2018-10-24: 100 ug via INTRAVENOUS
  Administered 2018-10-24: 50 ug via INTRAVENOUS
  Administered 2018-10-24: 25 ug via INTRAVENOUS

## 2018-10-24 MED ORDER — HYDROCODONE-ACETAMINOPHEN 5-325 MG PO TABS
1.0000 | ORAL_TABLET | ORAL | Status: DC | PRN
  Administered 2018-10-24: 2 via ORAL
  Filled 2018-10-24: qty 2

## 2018-10-24 MED ORDER — INDOMETHACIN 25 MG PO CAPS
25.0000 mg | ORAL_CAPSULE | Freq: Two times a day (BID) | ORAL | Status: DC | PRN
  Filled 2018-10-24: qty 1

## 2018-10-24 MED ORDER — THROMBIN 20000 UNITS EX SOLR
CUTANEOUS | Status: DC | PRN
  Administered 2018-10-24: 13:00:00

## 2018-10-24 MED ORDER — ESMOLOL HCL 100 MG/10ML IV SOLN
INTRAVENOUS | Status: DC | PRN
  Administered 2018-10-24: 30 mg via INTRAVENOUS
  Administered 2018-10-24: 50 mg via INTRAVENOUS

## 2018-10-24 MED ORDER — ONDANSETRON HCL 4 MG PO TABS: 4.0000 mg | ORAL_TABLET | Freq: Four times a day (QID) | ORAL | Status: DC | PRN

## 2018-10-24 MED ORDER — ALUM & MAG HYDROXIDE-SIMETH 200-200-20 MG/5ML PO SUSP: 30.0000 mL | Freq: Four times a day (QID) | ORAL | Status: DC | PRN

## 2018-10-24 MED ORDER — HYDROMORPHONE HCL 1 MG/ML IJ SOLN: 0.2500 mg | INTRAMUSCULAR | Status: DC | PRN

## 2018-10-24 MED ORDER — PHENYLEPHRINE 40 MCG/ML (10ML) SYRINGE FOR IV PUSH (FOR BLOOD PRESSURE SUPPORT)
PREFILLED_SYRINGE | INTRAVENOUS | Status: AC
  Filled 2018-10-24: qty 10

## 2018-10-24 MED ORDER — ONDANSETRON HCL 4 MG/2ML IJ SOLN
INTRAMUSCULAR | Status: DC | PRN
  Administered 2018-10-24: 4 mg via INTRAVENOUS

## 2018-10-24 MED ORDER — LIDOCAINE 2% (20 MG/ML) 5 ML SYRINGE
INTRAMUSCULAR | Status: DC | PRN
  Administered 2018-10-24: 100 mg via INTRAVENOUS

## 2018-10-24 MED ORDER — ONDANSETRON HCL 4 MG/2ML IJ SOLN
INTRAMUSCULAR | Status: AC
  Filled 2018-10-24: qty 2

## 2018-10-24 MED ORDER — ACETAMINOPHEN 500 MG PO TABS
1000.0000 mg | ORAL_TABLET | Freq: Once | ORAL | Status: AC
  Administered 2018-10-24: 1000 mg via ORAL

## 2018-10-24 MED ORDER — ONDANSETRON HCL 4 MG/2ML IJ SOLN: 4.0000 mg | Freq: Four times a day (QID) | INTRAMUSCULAR | Status: DC | PRN

## 2018-10-24 MED ORDER — OXYCODONE HCL 5 MG PO TABS
10.0000 mg | ORAL_TABLET | ORAL | Status: DC | PRN
  Administered 2018-10-24: 10 mg via ORAL
  Filled 2018-10-24: qty 2

## 2018-10-24 MED ORDER — LINAGLIPTIN 5 MG PO TABS
5.0000 mg | ORAL_TABLET | Freq: Every day | ORAL | Status: DC
  Administered 2018-10-24: 5 mg via ORAL
  Filled 2018-10-24: qty 1

## 2018-10-24 MED ORDER — MIDAZOLAM HCL 2 MG/2ML IJ SOLN
INTRAMUSCULAR | Status: AC
  Filled 2018-10-24: qty 2

## 2018-10-24 MED ORDER — DEXAMETHASONE SODIUM PHOSPHATE 10 MG/ML IJ SOLN: 10.0000 mg | INTRAMUSCULAR | Status: DC

## 2018-10-24 MED ORDER — FENTANYL CITRATE (PF) 250 MCG/5ML IJ SOLN
INTRAMUSCULAR | Status: AC
  Filled 2018-10-24: qty 5

## 2018-10-24 MED ORDER — BUPIVACAINE LIPOSOME 1.3 % IJ SUSP
INTRAMUSCULAR | Status: DC | PRN
  Administered 2018-10-24: 20 mL

## 2018-10-24 MED ORDER — THROMBIN 20000 UNITS EX SOLR
CUTANEOUS | Status: AC
  Filled 2018-10-24: qty 20000

## 2018-10-24 MED ORDER — PHENYLEPHRINE 40 MCG/ML (10ML) SYRINGE FOR IV PUSH (FOR BLOOD PRESSURE SUPPORT)
PREFILLED_SYRINGE | INTRAVENOUS | Status: DC | PRN
  Administered 2018-10-24 (×2): 80 ug via INTRAVENOUS

## 2018-10-24 MED ORDER — SODIUM CHLORIDE 0.9% FLUSH: 3.0000 mL | Freq: Two times a day (BID) | INTRAVENOUS | Status: DC

## 2018-10-24 MED ORDER — CEFAZOLIN SODIUM-DEXTROSE 2-4 GM/100ML-% IV SOLN: 2.0000 g | INTRAVENOUS | Status: DC

## 2018-10-24 MED ORDER — DEXMEDETOMIDINE HCL IN NACL 200 MCG/50ML IV SOLN
INTRAVENOUS | Status: DC | PRN
  Administered 2018-10-24: 12 ug via INTRAVENOUS
  Administered 2018-10-24 (×2): 4 ug via INTRAVENOUS
  Administered 2018-10-24: 12 ug via INTRAVENOUS

## 2018-10-24 MED ORDER — KETAMINE HCL 50 MG/5ML IJ SOSY
PREFILLED_SYRINGE | INTRAMUSCULAR | Status: AC
  Filled 2018-10-24: qty 5

## 2018-10-24 MED ORDER — LIDOCAINE-EPINEPHRINE 1 %-1:100000 IJ SOLN
INTRAMUSCULAR | Status: AC
  Filled 2018-10-24: qty 1

## 2018-10-24 MED ORDER — LIDOCAINE 2% (20 MG/ML) 5 ML SYRINGE
INTRAMUSCULAR | Status: AC
  Filled 2018-10-24: qty 5

## 2018-10-24 MED ORDER — PROMETHAZINE HCL 25 MG/ML IJ SOLN: 6.2500 mg | INTRAMUSCULAR | Status: DC | PRN

## 2018-10-24 MED ORDER — 0.9 % SODIUM CHLORIDE (POUR BTL) OPTIME
TOPICAL | Status: DC | PRN
  Administered 2018-10-24: 1000 mL

## 2018-10-24 MED ORDER — ASPIRIN EC 81 MG PO TBEC
81.0000 mg | DELAYED_RELEASE_TABLET | Freq: Every day | ORAL | Status: DC
  Administered 2018-10-24: 81 mg via ORAL
  Filled 2018-10-24: qty 1

## 2018-10-24 MED ORDER — LIDOCAINE-EPINEPHRINE 1 %-1:100000 IJ SOLN
INTRAMUSCULAR | Status: DC | PRN
  Administered 2018-10-24: 9 mL

## 2018-10-24 MED ORDER — LISINOPRIL 20 MG PO TABS
20.0000 mg | ORAL_TABLET | Freq: Every day | ORAL | Status: DC
  Administered 2018-10-24: 20 mg via ORAL
  Filled 2018-10-24: qty 1

## 2018-10-24 MED ORDER — ACETAMINOPHEN 325 MG PO TABS: 650.0000 mg | ORAL_TABLET | ORAL | Status: DC | PRN

## 2018-10-24 MED ORDER — MIDAZOLAM HCL 2 MG/2ML IJ SOLN
INTRAMUSCULAR | Status: DC | PRN
  Administered 2018-10-24: 2 mg via INTRAVENOUS

## 2018-10-24 MED ORDER — OXYCODONE HCL 5 MG/5ML PO SOLN: 5.0000 mg | Freq: Once | ORAL | Status: DC | PRN

## 2018-10-24 MED ORDER — LACTATED RINGERS IV SOLN
INTRAVENOUS | Status: DC
  Administered 2018-10-24 (×2): via INTRAVENOUS

## 2018-10-24 MED ORDER — HYDROCHLOROTHIAZIDE 12.5 MG PO CAPS
12.5000 mg | ORAL_CAPSULE | Freq: Every day | ORAL | Status: DC
  Administered 2018-10-24: 12.5 mg via ORAL
  Filled 2018-10-24: qty 1

## 2018-10-24 MED ORDER — FLUTICASONE PROPIONATE 50 MCG/ACT NA SUSP
2.0000 | Freq: Every day | NASAL | Status: DC
  Filled 2018-10-24: qty 16

## 2018-10-24 MED ORDER — CEFAZOLIN SODIUM-DEXTROSE 2-4 GM/100ML-% IV SOLN
INTRAVENOUS | Status: AC
  Filled 2018-10-24: qty 100

## 2018-10-24 MED ORDER — ACETAMINOPHEN 500 MG PO TABS
ORAL_TABLET | ORAL | Status: AC
  Administered 2018-10-24: 1000 mg via ORAL
  Filled 2018-10-24: qty 2

## 2018-10-24 MED ORDER — LISINOPRIL-HYDROCHLOROTHIAZIDE 20-12.5 MG PO TABS: 1.0000 | ORAL_TABLET | Freq: Every day | ORAL | Status: DC

## 2018-10-24 SURGICAL SUPPLY — 69 items
ADH SKN CLS APL DERMABOND .7 (GAUZE/BANDAGES/DRESSINGS) ×1
APL SKNCLS STERI-STRIP NONHPOA (GAUZE/BANDAGES/DRESSINGS) ×1
BAG DECANTER FOR FLEXI CONT (MISCELLANEOUS) ×2 IMPLANT
BASKET BONE COLLECTION (BASKET) IMPLANT
BENZOIN TINCTURE PRP APPL 2/3 (GAUZE/BANDAGES/DRESSINGS) ×2 IMPLANT
BLADE CLIPPER SURG (BLADE) IMPLANT
BLADE SURG 11 STRL SS (BLADE) ×2 IMPLANT
BUR CUTTER 7.0 ROUND (BURR) ×2 IMPLANT
BUR MATCHSTICK NEURO 3.0 LAGG (BURR) ×2 IMPLANT
CANISTER SUCT 3000ML PPV (MISCELLANEOUS) ×2 IMPLANT
CARTRIDGE OIL MAESTRO DRILL (MISCELLANEOUS) ×1 IMPLANT
CONT SPEC 4OZ CLIKSEAL STRL BL (MISCELLANEOUS) ×2 IMPLANT
COVER BACK TABLE 60X90IN (DRAPES) ×2 IMPLANT
COVER WAND RF STERILE (DRAPES) ×2 IMPLANT
DECANTER SPIKE VIAL GLASS SM (MISCELLANEOUS) ×2 IMPLANT
DERMABOND ADVANCED (GAUZE/BANDAGES/DRESSINGS) ×1
DERMABOND ADVANCED .7 DNX12 (GAUZE/BANDAGES/DRESSINGS) ×1 IMPLANT
DIFFUSER DRILL AIR PNEUMATIC (MISCELLANEOUS) ×2 IMPLANT
DRAPE C-ARM 42X72 X-RAY (DRAPES) ×2 IMPLANT
DRAPE C-ARMOR (DRAPES) IMPLANT
DRAPE HALF SHEET 40X57 (DRAPES) IMPLANT
DRAPE LAPAROTOMY 100X72X124 (DRAPES) ×2 IMPLANT
DRAPE SURG 17X23 STRL (DRAPES) ×2 IMPLANT
DRSG OPSITE 4X5.5 SM (GAUZE/BANDAGES/DRESSINGS) ×2 IMPLANT
DRSG OPSITE POSTOP 4X6 (GAUZE/BANDAGES/DRESSINGS) ×2 IMPLANT
DRSG OPSITE POSTOP 4X8 (GAUZE/BANDAGES/DRESSINGS) ×1 IMPLANT
DURAPREP 26ML APPLICATOR (WOUND CARE) ×2 IMPLANT
ELECT REM PT RETURN 9FT ADLT (ELECTROSURGICAL) ×2
ELECTRODE REM PT RTRN 9FT ADLT (ELECTROSURGICAL) ×1 IMPLANT
EVACUATOR 3/16  PVC DRAIN (DRAIN) ×1
EVACUATOR 3/16 PVC DRAIN (DRAIN) ×1 IMPLANT
GAUZE 4X4 16PLY RFD (DISPOSABLE) IMPLANT
GAUZE SPONGE 4X4 12PLY STRL (GAUZE/BANDAGES/DRESSINGS) ×2 IMPLANT
GLOVE BIO SURGEON STRL SZ7 (GLOVE) IMPLANT
GLOVE BIO SURGEON STRL SZ8 (GLOVE) ×4 IMPLANT
GLOVE BIOGEL PI IND STRL 7.0 (GLOVE) IMPLANT
GLOVE BIOGEL PI INDICATOR 7.0 (GLOVE)
GLOVE ECLIPSE 7.5 STRL STRAW (GLOVE) IMPLANT
GLOVE EXAM NITRILE XL STR (GLOVE) IMPLANT
GLOVE INDICATOR 8.5 STRL (GLOVE) ×4 IMPLANT
GOWN STRL REUS W/ TWL LRG LVL3 (GOWN DISPOSABLE) IMPLANT
GOWN STRL REUS W/ TWL XL LVL3 (GOWN DISPOSABLE) ×2 IMPLANT
GOWN STRL REUS W/TWL 2XL LVL3 (GOWN DISPOSABLE) IMPLANT
GOWN STRL REUS W/TWL LRG LVL3 (GOWN DISPOSABLE)
GOWN STRL REUS W/TWL XL LVL3 (GOWN DISPOSABLE) ×4
HEMOSTAT POWDER KIT SURGIFOAM (HEMOSTASIS) IMPLANT
KIT BASIN OR (CUSTOM PROCEDURE TRAY) ×2 IMPLANT
KIT TURNOVER KIT B (KITS) ×2 IMPLANT
MILL MEDIUM DISP (BLADE) ×2 IMPLANT
NDL HYPO 21X1.5 SAFETY (NEEDLE) ×1 IMPLANT
NDL HYPO 25X1 1.5 SAFETY (NEEDLE) ×1 IMPLANT
NEEDLE HYPO 21X1.5 SAFETY (NEEDLE) ×2 IMPLANT
NEEDLE HYPO 25X1 1.5 SAFETY (NEEDLE) ×2 IMPLANT
NS IRRIG 1000ML POUR BTL (IV SOLUTION) ×2 IMPLANT
OIL CARTRIDGE MAESTRO DRILL (MISCELLANEOUS) ×2
PACK LAMINECTOMY NEURO (CUSTOM PROCEDURE TRAY) ×2 IMPLANT
PAD ARMBOARD 7.5X6 YLW CONV (MISCELLANEOUS) ×6 IMPLANT
SPONGE LAP 4X18 RFD (DISPOSABLE) IMPLANT
SPONGE SURGIFOAM ABS GEL 100 (HEMOSTASIS) ×2 IMPLANT
STRIP CLOSURE SKIN 1/2X4 (GAUZE/BANDAGES/DRESSINGS) ×3 IMPLANT
SUT VIC AB 0 CT1 18XCR BRD8 (SUTURE) ×2 IMPLANT
SUT VIC AB 0 CT1 8-18 (SUTURE) ×6
SUT VIC AB 2-0 CT1 18 (SUTURE) ×3 IMPLANT
SUT VIC AB 4-0 PS2 27 (SUTURE) ×2 IMPLANT
SYR 20CC LL (SYRINGE) IMPLANT
TOWEL GREEN STERILE (TOWEL DISPOSABLE) ×2 IMPLANT
TOWEL GREEN STERILE FF (TOWEL DISPOSABLE) ×2 IMPLANT
TRAY FOLEY MTR SLVR 16FR STAT (SET/KITS/TRAYS/PACK) ×2 IMPLANT
WATER STERILE IRR 1000ML POUR (IV SOLUTION) ×2 IMPLANT

## 2018-10-24 NOTE — Anesthesia Procedure Notes (Signed)
Procedure Name: Intubation Date/Time: 10/24/2018 12:17 PM Performed by: Pearson Grippe, CRNA Pre-anesthesia Checklist: Patient identified, Emergency Drugs available, Suction available and Patient being monitored Patient Re-evaluated:Patient Re-evaluated prior to induction Oxygen Delivery Method: Circle system utilized Preoxygenation: Pre-oxygenation with 100% oxygen Induction Type: IV induction Ventilation: Mask ventilation without difficulty, Oral airway inserted - appropriate to patient size, Mask ventilation with difficulty and Two handed mask ventilation required Laryngoscope Size: Miller and 2 Grade View: Grade I Tube type: Oral Tube size: 7.5 mm Number of attempts: 1 Airway Equipment and Method: Stylet and Oral airway Placement Confirmation: ETT inserted through vocal cords under direct vision,  positive ETCO2 and breath sounds checked- equal and bilateral Secured at: 23 cm Tube secured with: Tape Dental Injury: Teeth and Oropharynx as per pre-operative assessment

## 2018-10-24 NOTE — Transfer of Care (Signed)
Immediate Anesthesia Transfer of Care Note  Patient: Devon Wood  Procedure(s) Performed: Re-exploration of fusion and removal of hardware (N/A Back)  Patient Location: PACU  Anesthesia Type:General  Level of Consciousness: awake, alert  and patient cooperative  Airway & Oxygen Therapy: Patient Spontanous Breathing and Patient connected to face mask oxygen  Post-op Assessment: Report given to RN and Post -op Vital signs reviewed and stable  Post vital signs: Reviewed and stable  Last Vitals:  Vitals Value Taken Time  BP 148/83 10/24/2018  1:48 PM  Temp    Pulse 89 10/24/2018  1:49 PM  Resp 18 10/24/2018  1:49 PM  SpO2 99 % 10/24/2018  1:49 PM  Vitals shown include unvalidated device data.  Last Pain:  Vitals:   10/24/18 1105  TempSrc:   PainSc: 4       Patients Stated Pain Goal: 2 (10/24/18 1105)  Complications: No apparent anesthesia complications

## 2018-10-24 NOTE — Anesthesia Postprocedure Evaluation (Signed)
Anesthesia Post Note  Patient: Devon Wood  Procedure(s) Performed: Re-exploration of fusion and removal of hardware (N/A Back)     Patient location during evaluation: PACU Anesthesia Type: General Level of consciousness: awake and alert Pain management: pain level controlled Vital Signs Assessment: post-procedure vital signs reviewed and stable Respiratory status: spontaneous breathing, nonlabored ventilation, respiratory function stable and patient connected to nasal cannula oxygen Cardiovascular status: blood pressure returned to baseline and stable Postop Assessment: no apparent nausea or vomiting Anesthetic complications: no    Last Vitals:  Vitals:   10/24/18 1443 10/24/18 1602  BP: (!) 149/87 (!) 151/76  Pulse: 86 (!) 108  Resp:  16  Temp: 36.9 C 37.1 C  SpO2: 99% 97%    Last Pain:  Vitals:   10/24/18 1602  TempSrc: Oral  PainSc:                  Catheryn Bacon Ellender

## 2018-10-24 NOTE — H&P (Addendum)
Devon Wood is an 58 y.o. male.   Chief Complaint: back pain HPI: 58 year old gentleman who previously undergonecovery 3-4 months postop fractrogress worsening back paiealed broken screws but what appears to be a solid mature fusion So I recommended reexpansion fusion we w redo posterior lateral fusion if it looked incompletely fused. I've extensively ent as well as perioperativtands and agrees to proceed forward.  Past Medical History:  Diagnosis Date  . Anxiety   . Arthritis   . Bronchitis    hx of;last time couple of yrs ago  . Bronchitis    hx of  . CAD (coronary artery disease) 10/10/2013   2004 2.0 x 25 mm drug-eluting cypher stent to LAD   . Chronic back pain    L3 buldging disc;DDD  . Complication of anesthesia    20-30 second asystole episode during induction  04/2003,  was on b-blocker tx at the time, found to have 80% LAD lesion s/p stent (but unclear if CAD was the direct cause)   . Coronary artery disease 2004   stent placement, sees Dr "C" [facility]  . Depression   . Diabetes mellitus    takes Glipizide and Metformin daily  . DM type 2 causing complication (HCC) 10/10/2013  . GERD (gastroesophageal reflux disease)    takes Pepcid daily  . Gout    hx of  . History of kidney stones   . HTN (hypertension) 10/10/2013  . Hypercholesteremia    takes Simvastatin daily  . Hypertension    takes Lisinopril daily  . Joint pain   . Kidney stones    "active ones at present"on the left side  . Mixed hyperlipidemia 10/10/2013  . Morbid obesity (HCC) 10/10/2013  . Nocturia   . Peptic ulcer     Past Surgical History:  Procedure Laterality Date  . BACK SURGERY    . CARDIAC CATHETERIZATION  11/27/2009   widely patent stent  . COLONOSCOPY WITH PROPOFOL N/A 03/17/2015   Procedure: COLONOSCOPY WITH PROPOFOL;  Surgeon: Charolett Bumpers, MD;  Location: [facility];  Service: Endoscopy;  Laterality: N/A;  . CORONARY STENT PLACEMENT  05/30/2003   proximal LAD  .  ESOPHAGOGASTRODUODENOSCOPY  in the 90's  . HAND SURGERY  1980   right  . LUMBAR DISC SURGERY  2002   L5  . LUMBAR FUSION  2004  . NM MYOCAR PERF WALL MOTION  05/19/2004   no evidence of ischemia  . TONSILLECTOMY  1969  . US ECHOCARDIOGRAPHY  04/30/2003   mildly dilated aortic root,mild LA enlargement,mild concentric LVH,trivial mitral and tricuspid regurg.,mild mitral annular ca+    Family History  Problem Relation Age of Onset  . COPD Mother   . Heart attack Father   . COPD Brother   . COPD Brother   . Dementia Maternal Grandmother   . Cancer Maternal Grandfather   . Heart failure Paternal Grandmother   . Heart attack Paternal Grandfather   . Hypertension Sister   . Anesthesia problems Neg Hx   . Hypotension Neg Hx   . Malignant hyperthermia Neg Hx   . Pseudochol deficiency Neg Hx    Social History:  reports that he quit smoking about 36 years ago. His smoking use included cigarettes. He has a 2.00 pack-year smoking history. His smokeless tobacco use includes chew. He reports current alcohol use. He reports that he does not use drugs.  Allergies:  Allergies  Allergen Reactions  . Erythromycin Rash    Medications Prior to  Admission  Medication Sig Dispense Refill  . allopurinol (ZYLOPRIM) 100 MG tablet Take 100 mg by mouth daily.     Marland Kitchen aspirin 81 MG tablet Take 81 mg by mouth daily.    Marland Kitchen escitalopram (LEXAPRO) 10 MG tablet Take 10 mg by mouth daily.    . famotidine (PEPCID) 20 MG tablet Take 20 mg by mouth daily.     . fluticasone (FLONASE) 50 MCG/ACT nasal spray Place 2 sprays into both nostrils daily.    Marland Kitchen glipiZIDE (GLUCOTROL) 5 MG tablet Take 5 mg by mouth daily before breakfast.     . HYDROcodone-acetaminophen (NORCO/VICODIN) 5-325 MG tablet Take 1-2 tablets by mouth every 4 (four) hours as needed (breakthrough pain). 30 tablet 0  . Krill Oil 300 MG CAPS Take 1 capsule by mouth daily.    Marland Kitchen lisinopril-hydrochlorothiazide (PRINZIDE,ZESTORETIC) 20-12.5 MG per tablet  Take 1 tablet by mouth daily.    . meloxicam (MOBIC) 15 MG tablet Take 15 mg by mouth daily.    . metFORMIN (GLUCOPHAGE) 500 MG tablet Take 1,000 mg by mouth 2 (two) times daily with a meal.     . Potassium 99 MG TABS Take 1 tablet by mouth daily.    . simvastatin (ZOCOR) 40 MG tablet Take 20 mg by mouth daily.      . sitaGLIPtin (JANUVIA) 100 MG tablet Take 100 mg by mouth daily.    . colchicine 0.6 MG tablet Take 0.6 mg by mouth daily as needed. For gout flares     . cyclobenzaprine (FLEXERIL) 10 MG tablet Take 1 tablet (10 mg total) by mouth 3 (three) times daily as needed for muscle spasms. 30 tablet 0  . indomethacin (INDOCIN) 25 MG capsule Take 25 mg by mouth 2 (two) times daily as needed. For gout flares      Results for orders placed or performed during the hospital encounter of 10/24/18 (from the past 48 hour(s))  Glucose, capillary     Status: Abnormal   Collection Time: 10/24/18 10:13 AM  Result Value Ref Range   Glucose-Capillary 143 (H) 70 - 99 mg/dL   Comment 1 Notify RN    No results found.  Review of Systems  Musculoskeletal: Positive for back pain.    Blood pressure (!) 144/88, pulse (!) 111, temperature 98.6 F (37 C), temperature source Oral, resp. rate 20, height 5\' 7"  (1.702 m), weight 106.1 kg, SpO2 98 %. Physical Exam  Constitutional: He is oriented to person, place, and time. He appears well-developed and well-nourished.  HENT:  Head: Normocephalic.  Eyes: Pupils are equal, round, and reactive to light.  Neck: Normal range of motion.  GI: Soft.  Neurological: He is alert and oriented to person, place, and time. He has normal strength. GCS eye subscore is 4. GCS verbal subscore is 5. GCS motor subscore is 6.  Strength 5 out of 5 iliopsoas, quads, hamstrings, gastric,anterior tibialis and EHL.  Skin: Skin is warm and dry.     Assessment/Plan 57yo gentleman presents for a reexploration of fusion removal of  Hardware and possible redo posterior  Lateral  fusion  Kresta Templeman P, MD 10/24/2018, 11:58 AM

## 2018-10-24 NOTE — Progress Notes (Signed)
Pt doing well. Pt and wife given D/C instructions with verbal understanding. Pt's incision is covered with Honeycomb dressing and has a minimally amount of drainage. Pt's IV was removed prior to D/C. Pt D/C'd home via wheelchair @ 1830 per MD order. Pt is stable @ D/C and has no other needs at this time. Rema Fendt, RN

## 2018-10-24 NOTE — Discharge Summary (Signed)
Physician Discharge Summary  Patient ID: Devon Wood MRN: [id number] DOB/AGE: 57/10/1960 57 y.o.  Admit date: 10/24/2018 Discharge date: 10/24/2018  Admission Diagnoses: Painful hardware and broken L4 screws with possible pseudoarthrosis   Discharge Diagnoses: Painful hardware and broken L4 screws with solid fusion L2-L4  Discharged Condition: good  Hospital Course: The patient was admitted on 10/24/2018 and taken to the operating room where the patient underwent removal of hardware L2-L4. The patient tolerated the procedure well and was taken to the recovery room and then to the floor in stable condition. The hospital course was routine. There were no complications. The wound remained clean dry and intact. Pt had appropriate back soreness. No complaints of leg pain or new N/T/W. The patient remained afebrile with stable vital signs, and tolerated a regular diet. The patient continued to increase activities, and pain was well controlled with oral pain medications.   Consults: None  Significant Diagnostic Studies:  Results for orders placed or performed during the hospital encounter of 10/24/18  Glucose, capillary  Result Value Ref Range   Glucose-Capillary 143 (H) 70 - 99 mg/dL   Comment 1 Notify RN   Glucose, capillary  Result Value Ref Range   Glucose-Capillary 146 (H) 70 - 99 mg/dL  Glucose, capillary  Result Value Ref Range   Glucose-Capillary 251 (H) 70 - 99 mg/dL   Comment 1 Notify RN    Comment 2 Document in Chart     Ct Lumbar Spine Wo Contrast  Result Date: 10/03/2018 CLINICAL DATA:  Numbness in the thighs and legs bilaterally. Prior lumbar fusion. Fall in 01/2018. EXAM: CT LUMBAR SPINE WITHOUT CONTRAST TECHNIQUE: Multidetector CT imaging of the lumbar spine was performed without intravenous contrast administration. Multiplanar CT image reconstructions were also generated. COMPARISON:  04/17/2018 FINDINGS: Segmentation: 5 lumbar type vertebrae. Alignment: Minimal  retrolisthesis of L1 on L2 and L2 on L3. Vertebrae: No acute fracture or destructive osseous lesion. Prior posterior and interbody fusion from L2-S1. Pedicle screws remain in place bilaterally from L2-L4 with chronic fracture of both L4 screws. There is no evidence of screw loosening. Old pedicle screw tracks are noted at L5 and S1. Solid interbody osseous fusion from L3-4 to L5-S1 and solid bilateral posterolateral osseous fusion masses at L4-5 and L5-S1. Persistent lack of solid interbody arthrodesis at L2-3 though with some mild interval maturation of posterolateral osseous fusion at L2-3 and L3-4. Paraspinal and other soft tissues: Aortic atherosclerosis without aneurysm. Postoperative changes in the posterior lumbar soft tissues. Punctate bilateral renal calculi. Disc levels: T11-12: Disc space narrowing and vacuum disc. Mild disc bulging and moderate right greater than left facet arthrosis result in borderline right neural foraminal stenosis without significant spinal stenosis. T12-L1: Disc space narrowing and vacuum disc. Mild disc bulging and mild facet arthrosis without stenosis, unchanged. L1-2: Progressive, complete disc space height loss with vacuum disc and extensive degenerative endplate changes. Circumferential disc bulging, endplate spurring, and facet hypertrophy result in mild-to-moderate bilateral neural foraminal stenosis, not significantly progressed. No evidence of significant spinal stenosis. L2-3 Prior wide posterior decompression: And fusion. Unchanged central disc osteophyte complex. No osseous spinal canal or neural foraminal stenosis. L3-4: Prior wide posterior decompression and fusion. No osseous spinal canal or neural foraminal stenosis. L4-5: Prior wide posterior decompression and fusion. No osseous spinal canal or neural foraminal stenosis. L5-S1: Prior wide posterior decompression and fusion. No osseous spinal canal or neural foraminal stenosis. IMPRESSION: 1. Slight progression of  severe L1-2 disc degeneration. Unchanged mild-to-moderate bilateral neural foraminal stenosis.  2. L2-S1 fusion as above. Chronic fracture of the L4 pedicle screws. 3.  Aortic Atherosclerosis (ICD10-I70.0). 4. Bilateral nephrolithiasis. Electronically Signed   By: Sebastian Ache M.D.   On: 10/03/2018 18:13    Antibiotics:  Anti-infectives (From admission, onward)   Start     Dose/Rate Route Frequency Ordered Stop   10/24/18 2000  ceFAZolin (ANCEF) IVPB 2g/100 mL premix     2 g 200 mL/hr over 30 Minutes Intravenous Every 8 hours 10/24/18 1436 10/25/18 1159   10/24/18 1248  bacitracin 50,000 Units in sodium chloride 0.9 % 500 mL irrigation  Status:  Discontinued       As needed 10/24/18 1249 10/24/18 1344   10/24/18 1114  ceFAZolin (ANCEF) 2-4 GM/100ML-% IVPB    Note to Pharmacy:  Clydene Laming   : cabinet override      10/24/18 1114 10/24/18 1219   10/24/18 1045  ceFAZolin (ANCEF) IVPB 2g/100 mL premix     2 g 200 mL/hr over 30 Minutes Intravenous On call to O.R. 10/24/18 1038 10/24/18 1249   10/24/18 1045  ceFAZolin (ANCEF) IVPB 2g/100 mL premix  Status:  Discontinued     2 g 200 mL/hr over 30 Minutes Intravenous On call to O.R. 10/24/18 1042 10/24/18 1249      Discharge Exam: Blood pressure (!) 151/76, pulse (!) 108, temperature 98.8 F (37.1 C), temperature source Oral, resp. rate 16, height 5\' 7"  (1.702 m), weight 106.1 kg, SpO2 97 %. Neurologic: Grossly normal Ambulating and voiding well  Discharge Medications:   Allergies as of 10/24/2018      Reactions   Erythromycin Rash      Medication List    TAKE these medications   allopurinol 100 MG tablet Commonly known as:  ZYLOPRIM Take 100 mg by mouth daily.   aspirin 81 MG tablet Take 81 mg by mouth daily.   colchicine 0.6 MG tablet Take 0.6 mg by mouth daily as needed. For gout flares   cyclobenzaprine 10 MG tablet Commonly known as:  FLEXERIL Take 1 tablet (10 mg total) by mouth 3 (three) times daily as needed for  muscle spasms.   escitalopram 10 MG tablet Commonly known as:  LEXAPRO Take 10 mg by mouth daily.   famotidine 20 MG tablet Commonly known as:  PEPCID Take 20 mg by mouth daily.   fluticasone 50 MCG/ACT nasal spray Commonly known as:  FLONASE Place 2 sprays into both nostrils daily.   glipiZIDE 5 MG tablet Commonly known as:  GLUCOTROL Take 5 mg by mouth daily before breakfast.   HYDROcodone-acetaminophen 5-325 MG tablet Commonly known as:  NORCO/VICODIN Take 1-2 tablets by mouth every 4 (four) hours as needed (breakthrough pain). What changed:  Another medication with the same name was added. Make sure you understand how and when to take each.   HYDROcodone-acetaminophen 5-325 MG tablet Commonly known as:  NORCO/VICODIN Take 1 tablet by mouth every 4 (four) hours as needed for moderate pain. What changed:  You were already taking a medication with the same name, and this prescription was added. Make sure you understand how and when to take each.   indomethacin 25 MG capsule Commonly known as:  INDOCIN Take 25 mg by mouth 2 (two) times daily as needed. For gout flares   Krill Oil 300 MG Caps Take 1 capsule by mouth daily.   lisinopril-hydrochlorothiazide 20-12.5 MG tablet Commonly known as:  PRINZIDE,ZESTORETIC Take 1 tablet by mouth daily.   meloxicam 15 MG tablet Commonly known as:  MOBIC Take 15 mg by mouth daily.   metFORMIN 500 MG tablet Commonly known as:  GLUCOPHAGE Take 1,000 mg by mouth 2 (two) times daily with a meal.   Potassium 99 MG Tabs Take 1 tablet by mouth daily.   simvastatin 40 MG tablet Commonly known as:  ZOCOR Take 20 mg by mouth daily.   sitaGLIPtin 100 MG tablet Commonly known as:  JANUVIA Take 100 mg by mouth daily.       Disposition: home   Final Dx: removal of hardware L2-L4  Discharge Instructions     Remove dressing in 72 hours   Complete by:  As directed    Call MD for:  difficulty breathing, headache or visual  disturbances   Complete by:  As directed    Call MD for:  hives   Complete by:  As directed    Call MD for:  persistant dizziness or light-headedness   Complete by:  As directed    Call MD for:  persistant nausea and vomiting   Complete by:  As directed    Call MD for:  redness, tenderness, or signs of infection (pain, swelling, redness, odor or green/yellow discharge around incision site)   Complete by:  As directed    Call MD for:  severe uncontrolled pain   Complete by:  As directed    Call MD for:  temperature >100.4   Complete by:  As directed    Diet - low sodium heart healthy   Complete by:  As directed    Increase activity slowly   Complete by:  As directed          Signed: Tiana Loft Farah Lepak 10/24/2018, 6:04 PM

## 2018-10-24 NOTE — Op Note (Signed)
Preoperative diagnosis: Painful hardware and broken L4 screws with possible pseudoarthrosis   postoperative diagnosis:1. Appeared to Be solid fusion from L2-L4 with broken L4 screws  Procedure: Exploration of fusion removal of hardware L2-L4 with removal of bilateral globus 6.35 Revere screws at L2 and L3 and removal of bilateral screw heads that were 6.35 Legacy at L4  Surgeon: Jillyn Hidden Amitai Delaughter  Asst.: Julien Girt  Anesthesia: Gen.  EBL: Minimal  History of present illness:58 year old gentleman who had undergone an L2-L4 extension of his fusion back about 9-10 months ago in 3-4 months postop fell across about the fracture is L4 screws and has had progressive worsening back pain ever since. Workup with multiple CT scans as shown progressive fusion at L2-3 and L3-4 with broken L4 pedicle screws and is felt the broken hardware and the toggle fact was contributing to his back pain so I recommended exploration of fusion we will hardware. There is possibility that he may not have a solid fusion L3-4 however on his latest CT scan and intraoperatively it looked like the entire L2-S1 construct was fused. I extensively went over the risks and benefits of removal of hardware possible redo posterior lateral arthrodesis with him as well as perioperative course expectations of outcome and alternatives of surgery and he understands and agrees to proceed forward.  Operative procedure: Patient brought into the or was to send general anesthesia positioned prone on the Wilson frame his back was prepped and draped in routine sterile fashion super aspect of his old incision was opened up after infiltration of 10 mL lidocaine with epi and a midline incision was used to dissect through the scar tissue I dissected and identified the L1 spinous process and subsequent L2-L4 screw construct. Then I removed the knots at L2-3 and L4 utilizing the globus removal at L2 and L3 Legacy removal of L4 removed the rod and unscrewed the  remainder the broken screw heads at L4. I then assessed the fusion by grabbing hold of the L3 screw in the entire construct moved as a unit I did not appreciate any evidence of abnormal motion at 23 or 34. So I elected to remove all fall a screws are removed the 4 globus Revere screws I was unable to get to the shanks of the Legacy screws that were distal and the pedicle and vertebral body of L4. They were subsequently left behind. The wound was a go see her get meticulous hemostasis was maintained wax was put the screw holes and the wounds closed in layers with interrupted Vicryl running 4 subcuticular Dermabond benzo and Steri-Strips and a sterile dressing was applied patient recovered in stable condition. At the end the case all needle counts sponge counts were correct.

## 2019-02-05 ENCOUNTER — Other Ambulatory Visit: Payer: Self-pay | Admitting: Neurosurgery

## 2019-02-05 DIAGNOSIS — T84216A Breakdown (mechanical) of internal fixation device of vertebrae, initial encounter: Secondary | ICD-10-CM

## 2019-02-20 ENCOUNTER — Ambulatory Visit
Admission: RE | Admit: 2019-02-20 | Discharge: 2019-02-20 | Disposition: A | Discharge status: Home / Self Care | Payer: BC Managed Care – PPO | Source: Ambulatory Visit | Attending: Neurosurgery | Admitting: Neurosurgery

## 2019-02-20 ENCOUNTER — Other Ambulatory Visit: Payer: Self-pay

## 2019-02-20 DIAGNOSIS — T84216A Breakdown (mechanical) of internal fixation device of vertebrae, initial encounter: Secondary | ICD-10-CM

## 2019-03-13 ENCOUNTER — Telehealth: Payer: Self-pay

## 2019-03-13 NOTE — Telephone Encounter (Signed)
     [facility] Pre-operative Risk Assessment    Request for surgical clearance:  1. What type of surgery is being performed? SPINAL ESI   2. When is this surgery scheduled? 04-03-2019   3. What type of clearance is required (medical clearance vs. Pharmacy clearance to hold med vs. Both)? BOTH  4. Are there any medications that need to be held prior to surgery and how long? ASA 6 DAYS PRIOR   5. Practice name and name of physician performing surgery?  [facility] DR Freda MunroBaltazar Najjar, RN  6. What is your office phone number?  [phone]   7.   What is your office fax number [phone]  8.   Anesthesia type (None, local, MAC, general) ? NOT LISTED   Waylan Rocher 03/13/2019, 2:48 PM  _________________________________________________________________   (provider comments below)

## 2019-03-14 NOTE — Telephone Encounter (Signed)
Left message for the patient to call back and speak to the on call preop APP. Note, he was cleared for back surgery earlier this year, as long as there is no new issues, I think he should cleared.

## 2019-03-18 NOTE — Telephone Encounter (Signed)
   Primary Cardiologist: Sanda Klein, MD  Chart reviewed as part of pre-operative protocol coverage. Patient was contacted 03/18/2019 in reference to pre-operative risk assessment for pending surgery as outlined below.  Devon Wood was last seen on 09/26/18 by Dr. Sallyanne Kuster.  Since that day, Devon Wood has done well without angina with his CAD.    Therefore, based on ACC/AHA guidelines, the patient would be at acceptable risk for the planned procedure without further cardiovascular testing.  It is fine to hold Asprin 6 days prior to injection and resume post op.  I will route this recommendation to the requesting party via Epic fax function and remove from pre-op pool.  Please call with questions.  Cecilie Kicks, NP 03/18/2019, 9:57 AM

## 2019-10-02 ENCOUNTER — Ambulatory Visit: Payer: BC Managed Care – PPO | Admitting: Physician Assistant

## 2019-10-02 ENCOUNTER — Encounter: Payer: Self-pay | Admitting: Physician Assistant

## 2019-10-02 ENCOUNTER — Other Ambulatory Visit: Payer: Self-pay

## 2019-10-02 VITALS — BP 158/82 | HR 101 | Temp 98.2°F | Ht 67.0 in | Wt 229.6 lb

## 2019-10-02 DIAGNOSIS — I10 Essential (primary) hypertension: Secondary | ICD-10-CM | POA: Diagnosis not present

## 2019-10-02 DIAGNOSIS — E782 Mixed hyperlipidemia: Secondary | ICD-10-CM

## 2019-10-02 DIAGNOSIS — Z01810 Encounter for preprocedural cardiovascular examination: Secondary | ICD-10-CM | POA: Diagnosis not present

## 2019-10-02 DIAGNOSIS — I712 Thoracic aortic aneurysm, without rupture, unspecified: Secondary | ICD-10-CM

## 2019-10-02 DIAGNOSIS — I2510 Atherosclerotic heart disease of native coronary artery without angina pectoris: Secondary | ICD-10-CM

## 2019-10-02 DIAGNOSIS — E119 Type 2 diabetes mellitus without complications: Secondary | ICD-10-CM

## 2019-10-02 NOTE — Progress Notes (Signed)
Cardiology Office Note:    Date:  10/04/2019   ID:  Devon Wood, DOB 04/13/1961, MRN [id number]  PCP:  Georgann Housekeeper, MD  Cardiologist:  Thurmon Fair, MD  Electrophysiologist:  None   Referring MD: Georgann Housekeeper, MD   Chief Complaint  Patient presents with  . Follow-up    seen for Dr. Royann Shivers    History of Present Illness:    Devon Wood is a 59 y.o. male with a hx of CAD, chronic back pain, DM 2, hypertension, hyperlipidemia, and morbid obesity.  Patient had a remote stent placement to LAD in 2004 by Dr. Jenne Campus.  Repeat cardiac catheterization in 2011 showed widely patent stent.  A previous remote echocardiogram reported dilated ascending aorta, however this is not confirmed by chest CT.  He was last seen by Dr. Royann Shivers in January 2020 as part of his preoperative clearance prior to back surgery.  Patient presents today for cardiology office visit.  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  Otherwise he denies any recent chest pain or exertional shortness of breath.  He has no lower extremity edema, orthopnea or PND.  Last years back surgery has been quite successful and that he has not had any significant pain since.  I took a look at his previous echocardiogram in 2004 at which time, it was suggested he had a 4.3 cm dilated ascending aorta.  Unfortunately the last CT angiogram of the chest did not mention the size of the aorta.  I do recommend a repeat CTA of the chest sometime in the future.  This is not urgent however ideally should be done in the next 6 to 9 months.  He is trying to avoid social contact due to the COVID-19.  He is aware that he will need a repeat basic metabolic panel within 2 weeks of the CT image.   Past Medical History:  Diagnosis Date  . Anxiety   . Arthritis   . Bronchitis    hx of;last time couple of yrs ago  . Bronchitis    hx of  . CAD (coronary artery disease) 10/10/2013   2004 2.0 x 25 mm drug-eluting cypher stent  to LAD   . Chronic back pain    L3 buldging disc;DDD  . Complication of anesthesia    20-30 second asystole episode during induction  04/2003,  was on b-blocker tx at the time, found to have 80% LAD lesion s/p stent (but unclear if CAD was the direct cause)   . Coronary artery disease 2004   stent placement, sees Dr "C" [facility]  . Depression   . Diabetes mellitus    takes Glipizide and Metformin daily  . DM type 2 causing complication (HCC) 10/10/2013  . GERD (gastroesophageal reflux disease)    takes Pepcid daily  . Gout    hx of  . History of kidney stones   . HTN (hypertension) 10/10/2013  . Hypercholesteremia    takes Simvastatin daily  . Hypertension    takes Lisinopril daily  . Joint pain   . Kidney stones    "active ones at present"on the left side  . Mixed hyperlipidemia 10/10/2013  . Morbid obesity (HCC) 10/10/2013  . Nocturia   . Peptic ulcer     Past Surgical History:  Procedure Laterality Date  . BACK SURGERY    . CARDIAC CATHETERIZATION  11/27/2009   widely patent stent  . COLONOSCOPY WITH PROPOFOL N/A 03/17/2015  Procedure: COLONOSCOPY WITH PROPOFOL;  Surgeon: Garlan Fair, MD;  Location: [facility];  Service: Endoscopy;  Laterality: N/A;  . CORONARY STENT PLACEMENT  05/30/2003   proximal LAD  . ESOPHAGOGASTRODUODENOSCOPY  in the 90's  . HAND SURGERY  1980   right  . LUMBAR DISC SURGERY  2002   L5  . LUMBAR FUSION  2004  . NM MYOCAR PERF WALL MOTION  05/19/2004   no evidence of ischemia  . TONSILLECTOMY  1969  . US ECHOCARDIOGRAPHY  04/30/2003   mildly dilated aortic root,mild LA enlargement,mild concentric LVH,trivial mitral and tricuspid regurg.,mild mitral annular ca+    Current Medications: Current Meds  Medication Sig  . allopurinol (ZYLOPRIM) 100 MG tablet Take 100 mg by mouth daily.   Marland Kitchen aspirin 81 MG tablet Take 81 mg by mouth daily.  . colchicine 0.6 MG tablet Take 0.6 mg by mouth daily as needed. For gout flares   .  cyclobenzaprine (FLEXERIL) 10 MG tablet Take 1 tablet (10 mg total) by mouth 3 (three) times daily as needed for muscle spasms.  Marland Kitchen escitalopram (LEXAPRO) 10 MG tablet Take 10 mg by mouth daily.  . famotidine (PEPCID) 20 MG tablet Take 20 mg by mouth daily.   . fluticasone (FLONASE) 50 MCG/ACT nasal spray Place 2 sprays into both nostrils daily.  Marland Kitchen glipiZIDE (GLUCOTROL) 5 MG tablet Take 5 mg by mouth daily before breakfast.   . indomethacin (INDOCIN) 25 MG capsule Take 25 mg by mouth 2 (two) times daily as needed. For gout flares  . Krill Oil 300 MG CAPS Take 1 capsule by mouth daily.  Marland Kitchen lisinopril-hydrochlorothiazide (PRINZIDE,ZESTORETIC) 20-12.5 MG per tablet Take 1 tablet by mouth daily.  . meloxicam (MOBIC) 15 MG tablet Take 15 mg by mouth daily.  . metFORMIN (GLUCOPHAGE) 500 MG tablet Take 1,000 mg by mouth 2 (two) times daily with a meal.   . Potassium 99 MG TABS Take 1 tablet by mouth daily.  . simvastatin (ZOCOR) 40 MG tablet Take 20 mg by mouth daily.    . sitaGLIPtin (JANUVIA) 100 MG tablet Take 100 mg by mouth daily.     Allergies:   Erythromycin   Social History   Socioeconomic History  . Marital status: Married    Spouse name: Not on file  . Number of children: Not on file  . Years of education: Not on file  . Highest education level: Not on file  Occupational History  . Not on file  Tobacco Use  . Smoking status: Former Smoker    Packs/day: 0.50    Years: 4.00    Pack years: 2.00    Types: Cigarettes    Quit date: 1984    Years since quitting: 37.1  . Smokeless tobacco: Current User    Types: Chew  Substance and Sexual Activity  . Alcohol use: Yes    Comment: occasionally  . Drug use: No  . Sexual activity: Yes  Other Topics Concern  . Not on file  Social History Narrative  . Not on file   Social Determinants of Health   Financial Resource Strain:   . Difficulty of Paying Living Expenses: Not on file  Food Insecurity:   . Worried About Sales executive in the Last Year: Not on file  . Ran Out of Food in the Last Year: Not on file  Transportation Needs:   . Lack of Transportation (Medical): Not on file  . Lack of Transportation (Non-Medical): Not on file  Physical Activity:   . Days of Exercise per Week: Not on file  . Minutes of Exercise per Session: Not on file  Stress:   . Feeling of Stress : Not on file  Social Connections:   . Frequency of Communication with Friends and Family: Not on file  . Frequency of Social Gatherings with Friends and Family: Not on file  . Attends Religious Services: Not on file  . Active Member of Clubs or Organizations: Not on file  . Attends Banker Meetings: Not on file  . Marital Status: Not on file     Family History: The patient's family history includes COPD in his brother, brother, and mother; Cancer in his maternal grandfather; Dementia in his maternal grandmother; Heart attack in his father and paternal grandfather; Heart failure in his paternal grandmother; Hypertension in his sister. There is no history of Anesthesia problems, Hypotension, Malignant hyperthermia, or Pseudochol deficiency.  ROS:   Please see the history of present illness.     All other systems reviewed and are negative.  EKGs/Labs/Other Studies Reviewed:    The following studies were reviewed today:  N/A  EKG:  EKG is ordered today.  The ekg ordered today demonstrates mild sinus tachycardia, otherwise no significant ST-T wave changes.  Recent Labs: 10/19/2018: BUN 23; Creatinine, Ser 1.26; Hemoglobin 12.8; Platelets 189; Potassium 3.9; Sodium 139  Recent Lipid Panel    Component Value Date/Time   CHOL  11/27/2009 1631    110        ATP III CLASSIFICATION:  <200     mg/dL   Desirable  782-956  mg/dL   Borderline High  >=213    mg/dL   High          TRIG 086 (H) 11/27/2009 1631   HDL 31 (L) 11/27/2009 1631   CHOLHDL 3.5 11/27/2009 1631   VLDL 38 11/27/2009 1631   LDLCALC  11/27/2009 1631    41         Total Cholesterol/HDL:CHD Risk Coronary Heart Disease Risk Table                     Men   Women  1/2 Average Risk   3.4   3.3  Average Risk       5.0   4.4  2 X Average Risk   9.6   7.1  3 X Average Risk  23.4   11.0        Use the calculated Patient Ratio above and the CHD Risk Table to determine the patient's CHD Risk.        ATP III CLASSIFICATION (LDL):  <100     mg/dL   Optimal  578-469  mg/dL   Near or Above                    Optimal  130-159  mg/dL   Borderline  629-528  mg/dL   High  >413     mg/dL   Very High    Physical Exam:    VS:  BP (!) 158/82   Pulse (!) 101   Temp 98.2 F (36.8 C)   Ht 5\' 7"  (1.702 m)   Wt 229 lb 9.6 oz (104.1 kg)   SpO2 97%   BMI 35.96 kg/m     Wt Readings from Last 3 Encounters:  10/02/19 229 lb 9.6 oz (104.1 kg)  10/24/18 234 lb (106.1 kg)  10/19/18 234 lb 9.6 oz (106.4 kg)  GEN:  Well nourished, well developed in no acute distress HEENT: Normal NECK: No JVD; No carotid bruits LYMPHATICS: No lymphadenopathy CARDIAC: RRR, no murmurs, rubs, gallops RESPIRATORY:  Clear to auscultation without rales, wheezing or rhonchi  ABDOMEN: Soft, non-tender, non-distended MUSCULOSKELETAL:  No edema; No deformity  SKIN: Warm and dry NEUROLOGIC:  Alert and oriented x 3 PSYCHIATRIC:  Normal affect   ASSESSMENT:    1. Coronary artery disease involving native coronary artery of native heart without angina pectoris   2. Thoracic aortic aneurysm without rupture (HCC)   3. Pre-procedural cardiovascular examination   4. Essential hypertension   5. Mixed hyperlipidemia   6. Controlled type 2 diabetes mellitus without complication, without long-term current use of insulin (HCC)    PLAN:    In order of problems listed above:  1. CAD: Denies any recent chest pain.  Continue aspirin  2. Thoracic aortic aneurysm: Seen on remote echocardiogram.  Measuring at 4.3 cm in 2004.  He is overdue for repeat CTA.  3. Hypertension: Blood  pressure mildly elevated in the office.  He says normally his blood pressure is very well controlled at home  4. Hyperlipidemia: Continue Zocor  5. DM2: Managed by primary care provider   Medication Adjustments/Labs and Tests Ordered: Current medicines are reviewed at length with the patient today.  Concerns regarding medicines are outlined above.  Orders Placed This Encounter  Procedures  . CT ANGIO CHEST AORTA W/CM & OR WO/CM  . Basic metabolic panel  . EKG 12-Lead   No orders of the defined types were placed in this encounter.   Patient Instructions  Medication Instructions:  Your physician recommends that you continue on your current medications as directed. Please refer to the Current Medication list given to you today.  *If you need a refill on your cardiac medications before your next appointment, please call your pharmacy*  Lab Work: NONE ordered at this time of appointment.    Your physician recommends that you return for lab work 2 weeks before your CT Angiography (CTA):   BMET   If you have labs (blood work) drawn today and your tests are completely normal, you will receive your results only by: Marland Kitchen MyChart Message (if you have MyChart) OR . A paper copy in the mail If you have any lab test that is abnormal or we need to change your treatment, we will call you to review the results.  Testing/Procedures: Non-Cardiac CT Angiography (CTA), is a special type of CT scan that uses a computer to produce multi-dimensional views of major blood vessels throughout the body. In CT angiography, a contrast material is injected through an IV to help visualize the blood vessels  Patient will call the office when he is ready to schedule in the next 6-9 months  Follow-Up: At [facility], you and your health needs are our priority.  As part of our continuing mission to provide you with exceptional heart care, we have created designated Provider Care Teams.  These Care Teams  include your primary Cardiologist (physician) and Advanced Practice Providers (APPs -  Physician Assistants and Nurse Practitioners) who all work together to provide you with the care you need, when you need it.  Your next appointment:   12 month(s)  The format for your next appointment:   In Person  Provider:   Thurmon Fair, MD  Other Instructions      Signed, Azalee Course, [place]  10/04/2019 11:11 PM    [facility]

## 2019-10-02 NOTE — Patient Instructions (Addendum)
Medication Instructions:  Your physician recommends that you continue on your current medications as directed. Please refer to the Current Medication list given to you today.  *If you need a refill on your cardiac medications before your next appointment, please call your pharmacy*  Lab Work: NONE ordered at this time of appointment.    Your physician recommends that you return for lab work 2 weeks before your CT Angiography (CTA):   BMET   If you have labs (blood work) drawn today and your tests are completely normal, you will receive your results only by: Marland Kitchen MyChart Message (if you have MyChart) OR . A paper copy in the mail If you have any lab test that is abnormal or we need to change your treatment, we will call you to review the results.  Testing/Procedures: Non-Cardiac CT Angiography (CTA), is a special type of CT scan that uses a computer to produce multi-dimensional views of major blood vessels throughout the body. In CT angiography, a contrast material is injected through an IV to help visualize the blood vessels  Patient will call the office when he is ready to schedule in the next 6-9 months  Follow-Up: At [facility], you and your health needs are our priority.  As part of our continuing mission to provide you with exceptional heart care, we have created designated Provider Care Teams.  These Care Teams include your primary Cardiologist (physician) and Advanced Practice Providers (APPs -  Physician Assistants and Nurse Practitioners) who all work together to provide you with the care you need, when you need it.  Your next appointment:   12 month(s)  The format for your next appointment:   In Person  Provider:   Thurmon Fair, MD  Other Instructions

## 2019-10-04 ENCOUNTER — Encounter: Payer: Self-pay | Admitting: Physician Assistant

## 2020-01-07 ENCOUNTER — Encounter (HOSPITAL_COMMUNITY): Payer: Self-pay | Admitting: Emergency Medicine

## 2020-01-07 ENCOUNTER — Emergency Department (HOSPITAL_COMMUNITY)
Admission: EM | Admit: 2020-01-07 | Discharge: 2020-01-07 | Disposition: A | Discharge status: Home / Self Care | Payer: BC Managed Care – PPO | Attending: Emergency Medicine | Admitting: Emergency Medicine

## 2020-01-07 ENCOUNTER — Emergency Department (HOSPITAL_COMMUNITY): Payer: BC Managed Care – PPO

## 2020-01-07 ENCOUNTER — Other Ambulatory Visit: Payer: Self-pay

## 2020-01-07 DIAGNOSIS — F17290 Nicotine dependence, other tobacco product, uncomplicated: Secondary | ICD-10-CM | POA: Insufficient documentation

## 2020-01-07 DIAGNOSIS — Z7984 Long term (current) use of oral hypoglycemic drugs: Secondary | ICD-10-CM | POA: Diagnosis not present

## 2020-01-07 DIAGNOSIS — E162 Hypoglycemia, unspecified: Secondary | ICD-10-CM

## 2020-01-07 DIAGNOSIS — Z7982 Long term (current) use of aspirin: Secondary | ICD-10-CM | POA: Diagnosis not present

## 2020-01-07 DIAGNOSIS — I2510 Atherosclerotic heart disease of native coronary artery without angina pectoris: Secondary | ICD-10-CM | POA: Diagnosis not present

## 2020-01-07 DIAGNOSIS — Z79899 Other long term (current) drug therapy: Secondary | ICD-10-CM | POA: Insufficient documentation

## 2020-01-07 DIAGNOSIS — I10 Essential (primary) hypertension: Secondary | ICD-10-CM | POA: Diagnosis not present

## 2020-01-07 DIAGNOSIS — R479 Unspecified speech disturbances: Secondary | ICD-10-CM | POA: Diagnosis not present

## 2020-01-07 DIAGNOSIS — E11649 Type 2 diabetes mellitus with hypoglycemia without coma: Secondary | ICD-10-CM | POA: Insufficient documentation

## 2020-01-07 DIAGNOSIS — R531 Weakness: Secondary | ICD-10-CM | POA: Diagnosis present

## 2020-01-07 LAB — DIFFERENTIAL
Abs Immature Granulocytes: 0.04 10*3/uL (ref 0.00–0.07)
Basophils Absolute: 0 10*3/uL (ref 0.0–0.1)
Basophils Relative: 0 %
Eosinophils Absolute: 0 10*3/uL (ref 0.0–0.5)
Eosinophils Relative: 0 %
Immature Granulocytes: 1 %
Lymphocytes Relative: 10 %
Lymphs Abs: 0.8 10*3/uL (ref 0.7–4.0)
Monocytes Absolute: 0.7 10*3/uL (ref 0.1–1.0)
Monocytes Relative: 10 %
Neutro Abs: 5.8 10*3/uL (ref 1.7–7.7)
Neutrophils Relative %: 79 %

## 2020-01-07 LAB — COMPREHENSIVE METABOLIC PANEL
ALT: 32 U/L (ref 0–44)
AST: 57 U/L — ABNORMAL HIGH (ref 15–41)
Albumin: 3.9 g/dL (ref 3.5–5.0)
Alkaline Phosphatase: 32 U/L — ABNORMAL LOW (ref 38–126)
Anion gap: 16 — ABNORMAL HIGH (ref 5–15)
BUN: 13 mg/dL (ref 6–20)
CO2: 21 mmol/L — ABNORMAL LOW (ref 22–32)
Calcium: 9 mg/dL (ref 8.9–10.3)
Chloride: 94 mmol/L — ABNORMAL LOW (ref 98–111)
Creatinine, Ser: 1.04 mg/dL (ref 0.61–1.24)
GFR calc Af Amer: >60 mL/min (ref >60)
GFR calc non Af Amer: >60 mL/min (ref >60)
Glucose, Bld: 57 mg/dL — ABNORMAL LOW (ref 70–99)
Potassium: 3.6 mmol/L (ref 3.5–5.1)
Sodium: 131 mmol/L — ABNORMAL LOW (ref 135–145)
Total Bilirubin: 0.8 mg/dL (ref 0.3–1.2)
Total Protein: 7.2 g/dL (ref 6.5–8.1)

## 2020-01-07 LAB — CBC
HCT: 36.3 % — ABNORMAL LOW (ref 39.0–52.0)
Hemoglobin: 12.8 g/dL — ABNORMAL LOW (ref 13.0–17.0)
MCH: 33.5 pg (ref 26.0–34.0)
MCHC: 35.3 g/dL (ref 30.0–36.0)
MCV: 95 fL (ref 80.0–100.0)
Platelets: 190 10*3/uL (ref 150–400)
RBC: 3.82 MIL/uL — ABNORMAL LOW (ref 4.22–5.81)
RDW: 12.1 % (ref 11.5–15.5)
WBC: 7.3 10*3/uL (ref 4.0–10.5)
nRBC: 0 % (ref 0.0–0.2)

## 2020-01-07 LAB — RAPID URINE DRUG SCREEN, HOSP PERFORMED
Amphetamines: NOT DETECTED
Barbiturates: NOT DETECTED
Benzodiazepines: NOT DETECTED
Cocaine: NOT DETECTED
Opiates: NOT DETECTED
Tetrahydrocannabinol: NOT DETECTED

## 2020-01-07 LAB — URINALYSIS, ROUTINE W REFLEX MICROSCOPIC
Bacteria, UA: NONE SEEN
Bilirubin Urine: NEGATIVE
Glucose, UA: 50 mg/dL — AB
Ketones, ur: NEGATIVE mg/dL
Leukocytes,Ua: NEGATIVE
Nitrite: NEGATIVE
Protein, ur: NEGATIVE mg/dL
Specific Gravity, Urine: 1.004 — ABNORMAL LOW (ref 1.005–1.030)
pH: 6 (ref 5.0–8.0)

## 2020-01-07 LAB — I-STAT CHEM 8, ED
BUN: 14 mg/dL (ref 6–20)
Calcium, Ion: 1.11 mmol/L — ABNORMAL LOW (ref 1.15–1.40)
Chloride: 94 mmol/L — ABNORMAL LOW (ref 98–111)
Creatinine, Ser: 1 mg/dL (ref 0.61–1.24)
Glucose, Bld: 53 mg/dL — ABNORMAL LOW (ref 70–99)
HCT: 38 % — ABNORMAL LOW (ref 39.0–52.0)
Hemoglobin: 12.9 g/dL — ABNORMAL LOW (ref 13.0–17.0)
Potassium: 3.6 mmol/L (ref 3.5–5.1)
Sodium: 131 mmol/L — ABNORMAL LOW (ref 135–145)
TCO2: 22 mmol/L (ref 22–32)

## 2020-01-07 LAB — PROTIME-INR
INR: 1 (ref 0.8–1.2)
Prothrombin Time: 13 seconds (ref 11.4–15.2)

## 2020-01-07 LAB — CBG MONITORING, ED
Glucose-Capillary: 136 mg/dL — ABNORMAL HIGH (ref 70–99)
Glucose-Capillary: 45 mg/dL — ABNORMAL LOW (ref 70–99)
Glucose-Capillary: 50 mg/dL — ABNORMAL LOW (ref 70–99)

## 2020-01-07 LAB — APTT: aPTT: 26 seconds (ref 24–36)

## 2020-01-07 LAB — ETHANOL: Alcohol, Ethyl (B): 63 mg/dL — ABNORMAL HIGH (ref <10)

## 2020-01-07 MED ORDER — DEXTROSE 50 % IV SOLN
INTRAVENOUS | Status: AC
  Filled 2020-01-07: qty 50

## 2020-01-07 MED ORDER — DEXTROSE 50 % IV SOLN
1.0000 | Freq: Once | INTRAVENOUS | Status: AC
  Administered 2020-01-07: 07:00:00 50 mL via INTRAVENOUS

## 2020-01-07 NOTE — Discharge Instructions (Signed)
Follow up with your primary care doctor in 2 to 5 days for hospital follow-up visit.   Please watch your blood sugar closely.  Return to the emergency department for any new or worsening symptoms.

## 2020-01-07 NOTE — ED Notes (Signed)
Pt transported to MRI 

## 2020-01-07 NOTE — ED Notes (Signed)
Pt verbalized understanding of discharge instructions. Follow up care reviewed, pt had no further questions. Pt brought to lobby via wheelchair.

## 2020-01-07 NOTE — ED Triage Notes (Signed)
Per EMS, pt from home, last night went to bed fine at 7pm, when he got up 3:30 he got up and fell to the floor, called EMS at 5:30am.  Wife stated he sounds "drunk" w/ slurred speech and is having trouble coming up w/ words.

## 2020-01-07 NOTE — Progress Notes (Signed)
Inpatient Diabetes Program Recommendations  AACE/ADA: New Consensus Statement on Inpatient Glycemic Control (2015)  Target Ranges:  Prepandial:   less than 140 mg/dL      Peak postprandial:   less than 180 mg/dL (1-2 hours)      Critically ill patients:  140 - 180 mg/dL   Lab Results  Component Value Date   GLUCAP 136 (H) 01/07/2020   HGBA1C 5.8 (H) 10/19/2018    Review of Glycemic Control  Results for Devon Wood, PERLEBERG (MRN [id number]) as of 01/07/2020 14:32  Ref. Range 01/07/2020 06:49 01/07/2020 06:53 01/07/2020 07:17  Glucose-Capillary Latest Ref Range: 70 - 99 mg/dL 45 (L) 50 (L) 093 (H)   Diabetes history: DM2  Outpatient Diabetes medications: Metformin 1000 mg BID + Januvia 100 mg QD + Glipizide 5 mg QD  Current orders for Inpatient glycemic control: NONE  Spoke with patient and wife at bedside.  Confirmed above medications.  Patient started to have slurred speech at home and was able to bare weight on right leg.  States his CBG was 96 mg/dl at home prior to coming to ED.  Was hypoglycemic on arrival to ED.  Wife states he hadn't eaten in 2 days and was very sleepy.  He checks his blood sugar daily and states his meter usually reads 110-120 mg/dl.  He last saw Dr. Donette Larry over a year ago prior to COVID.  Explained that Glipizide can cause hypoglycemia if not eating well.  This can be compounded with Januvia.  Encouraged patient to follow up with Dr. Donette Larry.  Encouraged patient to eat small amounts or drink something with a little sugar if he is taking his oral DM medications not feeling well to avoid hypoglycemia.  Normally he does not drink any sugary drinks and watches his CHO intake.  Last A1C was 5.8%.  Thank you, Dulce Sellar, RN, BSN Diabetes Coordinator Inpatient Diabetes Program [phone] (team pager from 8a-5p)

## 2020-01-07 NOTE — ED Notes (Signed)
Pt stated he cannot produce a urine sample right now.

## 2020-01-07 NOTE — ED Provider Notes (Signed)
[facility] [place] [facility] EMERGENCY DEPARTMENT Provider Note   CSN: [id number] Arrival date & time: 01/07/20  2778     History Chief Complaint  Patient presents with  . right sided weakness  . Aphasia    Devon Wood is a 59 y.o. male past medical history significant for CAD, chronic back pain, type 2 diabetes, hypertension presents to emergency department today via EMS with chief complaint of right-sided weakness. Patient states he went to bed at 730 PM last night and was feeling normal. He woke up at 330 AM this morning to use the bathroom. He states when he stood up to get out of bed, he fell to the floor because he could not feel his right side. He denies loss of consciousness or hitting his head. He laid on the floor x1 hour because his wife was unable to get him up. EMS was called to transport him to the hospital. His CBG for EMS was 113 and he was given aspirin. Patient's wife is at the bedside and is contributing historian who states he sounded like he was drunk. He admits to drinking alcohol frequently, he did have 2 drinks yesterday. He is having pain in his right shoulder which he state is chronic as he needs a replacement. He denies fever, chills, recent illness, headache, neck pain or stiffness, cough, shortness of breath, syncope, palpitations, chest pain, abdominal pain, urinary symptoms diarrhea, lower extremity edema, dizziness. He denies tobacco use.  Risk factors for CVA include hypertension, diabetic, frequent alcohol use, hyperlipidemia.  Past Medical History:  Diagnosis Date  . Anxiety   . Arthritis   . Bronchitis    hx of;last time couple of yrs ago  . Bronchitis    hx of  . CAD (coronary artery disease) 10/10/2013   2004 2.0 x 25 mm drug-eluting cypher stent to LAD   . Chronic back pain    L3 buldging disc;DDD  . Complication of anesthesia    20-30 second asystole episode during induction  04/2003,  was on b-blocker tx at the time, found to have 80% LAD lesion  s/p stent (but unclear if CAD was the direct cause)   . Coronary artery disease 2004   stent placement, sees Dr "C" [facility]  . Depression   . Diabetes mellitus    takes Glipizide and Metformin daily  . DM type 2 causing complication ([place]) 10/10/2351  . GERD (gastroesophageal reflux disease)    takes Pepcid daily  . Gout    hx of  . History of kidney stones   . HTN (hypertension) 10/10/2013  . Hypercholesteremia    takes Simvastatin daily  . Hypertension    takes Lisinopril daily  . Joint pain   . Kidney stones    "active ones at present"on the left side  . Mixed hyperlipidemia 10/10/2013  . Morbid obesity ([place]) 10/10/2013  . Nocturia   . Peptic ulcer     Patient Active Problem List   Diagnosis Date Noted  . Painful orthopaedic hardware ([place]) 10/24/2018  . Dyslipidemia (high LDL; low HDL) 09/29/2018  . Spinal stenosis 04/05/2017  . Severe obesity (BMI 35.0-35.9 with comorbidity) ([place]) 04/04/2017  . CAD (coronary artery disease) 10/10/2013  . Mixed hyperlipidemia 10/10/2013  . DM type 2 causing complication ([place]) 61/44/3154  . Essential hypertension 10/10/2013  . Morbid obesity ([place]) 10/10/2013  . Lumbar spinal stenosis 10/10/2013    Past Surgical History:  Procedure Laterality Date  . BACK SURGERY    .  CARDIAC CATHETERIZATION  11/27/2009   widely patent stent  . COLONOSCOPY WITH PROPOFOL N/A 03/17/2015   Procedure: COLONOSCOPY WITH PROPOFOL;  Surgeon: Charolett Bumpers, MD;  Location: [facility] END[facility]COPY;  Service: Endoscopy;  Laterality: N/A;  . CORONARY STENT PLACEMENT  05/30/2003   proximal LAD  . ESOPHAGOGASTRODUODENOSCOPY  in the 90's  . HAND SURGERY  1980   right  . LUMBAR DISC SURGERY  2002   L5  . LUMBAR FUSION  2004  . NM MYOCAR PERF WALL MOTION  05/19/2004   no evidence of ischemia  . TONSILLECTOMY  1969  . US ECHOCARDIOGRAPHY  04/30/2003   mildly dilated aortic root,mild LA enlargement,mild concentric LVH,trivial mitral and tricuspid regurg.,mild  mitral annular ca+       Family History  Problem Relation Age of Onset  . COPD Mother   . Heart attack Father   . COPD Brother   . COPD Brother   . Dementia Maternal Grandmother   . Cancer Maternal Grandfather   . Heart failure Paternal Grandmother   . Heart attack Paternal Grandfather   . Hypertension Sister   . Anesthesia problems Neg Hx   . Hypotension Neg Hx   . Malignant hyperthermia Neg Hx   . Pseudochol deficiency Neg Hx     Social History   Tobacco Use  . Smoking status: Former Smoker    Packs/day: 0.50    Years: 4.00    Pack years: 2.00    Types: Cigarettes    Quit date: 1984    Years since quitting: 37.3  . Smokeless tobacco: Current User    Types: Chew  Substance Use Topics  . Alcohol use: Yes    Comment: occasionally  . Drug use: No    Home Medications Prior to Admission medications   Medication Sig Start Date End Date Taking? Authorizing Provider  allopurinol (ZYLOPRIM) 100 MG tablet Take 100 mg by mouth daily.    Yes [provider]  aspirin 81 MG tablet Take 81 mg by mouth daily.   Yes [provider]  colchicine 0.6 MG tablet Take 0.6 mg by mouth daily as needed. For gout flares    Yes [provider]  cyclobenzaprine (FLEXERIL) 10 MG tablet Take 1 tablet (10 mg total) by mouth 3 (three) times daily as needed for muscle spasms. 04/07/17  Yes Donalee Citrin, MD  escitalopram (LEXAPRO) 10 MG tablet Take 10 mg by mouth daily. 10/07/13  Yes [provider]  famotidine (PEPCID) 20 MG tablet Take 20 mg by mouth daily.    Yes [provider]  fluticasone (FLONASE) 50 MCG/ACT nasal spray Place 2 sprays into both nostrils daily.   Yes [provider]  glipiZIDE (GLUCOTROL) 5 MG tablet Take 5 mg by mouth daily before breakfast.    Yes [provider]  indomethacin (INDOCIN) 25 MG capsule Take 25 mg by mouth 2 (two) times daily as needed. For gout flares   Yes [provider]  Krill Oil 300 MG  CAPS Take 1 capsule by mouth daily.   Yes [provider]  lisinopril-hydrochlorothiazide (PRINZIDE,ZESTORETIC) 20-12.5 MG per tablet Take 1 tablet by mouth daily. 09/20/13  Yes [provider]  meloxicam (MOBIC) 15 MG tablet Take 15 mg by mouth daily.   Yes [provider]  metFORMIN (GLUCOPHAGE) 500 MG tablet Take 1,000 mg by mouth 2 (two) times daily with a meal.    Yes [provider]  Potassium 99 MG TABS Take 1 tablet by mouth  daily.   Yes [provider]  simvastatin (ZOCOR) 40 MG tablet Take 20 mg by mouth daily.     Yes [provider]  sitaGLIPtin (JANUVIA) 100 MG tablet Take 100 mg by mouth daily.   Yes [provider]    Allergies    Erythromycin  Review of Systems   Review of Systems All other systems are reviewed and are negative for acute change except as noted in the HPI.  Physical Exam Updated Vital Signs BP (!) 156/84 (BP Location: Right Arm)   Pulse (!) 104   Temp 98.7 F (37.1 C) (Oral)   Ht 5\' 7"  (1.702 m)   Wt 102.5 kg   SpO2 98%   BMI 35.40 kg/m   Physical Exam Vitals and nursing note reviewed.  Constitutional:      General: He is not in acute distress.    Appearance: He is not ill-appearing.  HENT:     Head: Normocephalic and atraumatic.     Comments: No tenderness to palpation of skull. No deformities or crepitus noted. No open wounds, abrasions or lacerations.    Right Ear: Tympanic membrane and external ear normal.     Left Ear: Tympanic membrane and external ear normal.     Nose: Nose normal.     Mouth/Throat:     Mouth: Mucous membranes are moist.     Pharynx: Oropharynx is clear.  Eyes:     General: No scleral icterus.       Right eye: No discharge.        Left eye: No discharge.     Extraocular Movements: Extraocular movements intact.     Conjunctiva/sclera: Conjunctivae normal.     Pupils: Pupils are equal, round, and reactive to light.  Neck:     Vascular: No JVD.      Comments: Full ROM intact without spinous process TTP. No bony stepoffs or deformities, no paraspinous muscle TTP or muscle spasms. No rigidity or meningeal signs. No bruising, erythema, or swelling.  Cardiovascular:     Rate and Rhythm: Regular rhythm. Tachycardia present.     Pulses: Normal pulses.          Radial pulses are 2+ on the right side and 2+ on the left side.     Heart sounds: Normal heart sounds.     Comments: Heart rate 100-102 during exam Pulmonary:     Comments: Lungs clear to auscultation in all fields. Symmetric chest rise. No wheezing, rales, or rhonchi. Abdominal:     Comments: Abdomen is soft, non-distended, and non-tender in all quadrants. No rigidity, no guarding. No peritoneal signs.  Musculoskeletal:        General: Normal range of motion.     Cervical back: Normal range of motion.  Skin:    General: Skin is warm and dry.     Capillary Refill: Capillary refill takes less than 2 seconds.  Neurological:     Mental Status: He is oriented to person, place, and time.     GCS: GCS eye subscore is 4. GCS verbal subscore is 5. GCS motor subscore is 6.     Comments: Mental Status:  Alert, oriented, thought content appropriate, able to give a coherent history. Speech fluent without evidence of aphasia. Able to follow 2 step commands without difficulty.  Cranial Nerves:  II:  Peripheral visual fields grossly normal, pupils equal, round, reactive to light III,IV, VI: ptosis not present, extra-ocular motions intact bilaterally  V,VII: smile symmetric, facial light touch  sensation equal VIII: hearing grossly normal to voice  X: uvula elevates symmetrically  XI: bilateral shoulder shrug symmetric and strong XII: midline tongue extension without fassiculations Motor:  Normal tone. 5/5 in bilateral upper extremities. 4/5 strength in right lower extremity compared to 5/5 on left lower extremity. Sensory: Pinprick and light touch normal in all extremities.  Deep Tendon  Reflexes: 2+ and symmetric in the biceps and patella Cerebellar: normal finger-to-nose with bilateral upper extremities Slight right sided dysmetria and normal on the left. CV: distal pulses palpable throughout     Psychiatric:        Behavior: Behavior normal.       ED Results / Procedures / Treatments   Labs (all labs ordered are listed, but only abnormal results are displayed) Labs Reviewed  ETHANOL - Abnormal; Notable for the following components:      Result Value   Alcohol, Ethyl (B) 63 (*)    All other components within normal limits  CBC - Abnormal; Notable for the following components:   RBC 3.82 (*)    Hemoglobin 12.8 (*)    HCT 36.3 (*)    All other components within normal limits  COMPREHENSIVE METABOLIC PANEL - Abnormal; Notable for the following components:   Sodium 131 (*)    Chloride 94 (*)    CO2 21 (*)    Glucose, Bld 57 (*)    AST 57 (*)    Alkaline Phosphatase 32 (*)    Anion gap 16 (*)    All other components within normal limits  URINALYSIS, ROUTINE W REFLEX MICROSCOPIC - Abnormal; Notable for the following components:   Color, Urine STRAW (*)    Specific Gravity, Urine 1.004 (*)    Glucose, UA 50 (*)    Hgb urine dipstick MODERATE (*)    All other components within normal limits  I-STAT CHEM 8, ED - Abnormal; Notable for the following components:   Sodium 131 (*)    Chloride 94 (*)    Glucose, Bld 53 (*)    Calcium, Ion 1.11 (*)    Hemoglobin 12.9 (*)    HCT 38.0 (*)    All other components within normal limits  CBG MONITORING, ED - Abnormal; Notable for the following components:   Glucose-Capillary 45 (*)    All other components within normal limits  CBG MONITORING, ED - Abnormal; Notable for the following components:   Glucose-Capillary 50 (*)    All other components within normal limits  CBG MONITORING, ED - Abnormal; Notable for the following components:   Glucose-Capillary 136 (*)    All other components within normal limits    PROTIME-INR  APTT  DIFFERENTIAL  RAPID URINE DRUG SCREEN, HOSP PERFORMED    EKG EKG Interpretation  Date/Time:  Tuesday Jan 07 2020 06:45:35 EDT Ventricular Rate:  102 PR Interval:    QRS Duration: 102 QT Interval:  362 QTC Calculation: 472 R Axis:   -55 Text Interpretation: Sinus tachycardia Left anterior fascicular block Abnormal R-wave progression, late transition Confirmed by Kennis Carina [phone]) on 01/07/2020 7:31:18 AM Also confirmed by Kennis Carina [id number]), editor Elita Quick ([id number])  on 01/07/2020 10:51:37 AM   Radiology CT Head Wo Contrast  Result Date: 01/07/2020 CLINICAL DATA:  Neuro deficit, acute, stroke suspected. Right-sided weakness, aphasia per patient. EXAM: CT HEAD WITHOUT CONTRAST TECHNIQUE: Contiguous axial images were obtained from the base of the skull through the vertex without intravenous contrast. COMPARISON:  No pertinent prior studies available for comparison. FINDINGS: Brain: There  is mild generalized parenchymal atrophy. Minimal ill-defined hypoattenuation within the cerebral white matter which is nonspecific, but consistent with chronic small vessel ischemic disease. There is no acute intracranial hemorrhage. No demarcated cortical infarct. No extra-axial fluid collection. No evidence of intracranial mass. No midline shift. Vascular: No hyperdense vessel.  Atherosclerotic calcifications. Skull: Normal. Negative for fracture or focal lesion. Sinuses/Orbits: Visualized orbits show no acute finding. Mild ethmoid and maxillary sinus mucosal thickening. No significant mastoid effusion. IMPRESSION: 1. No evidence of acute intracranial abnormality. 2. Mild generalized parenchymal atrophy and chronic small vessel ischemic disease. 3. Mild ethmoid and maxillary sinus mucosal thickening. Electronically Signed   By: Jackey LogeKyle  Golden DO   On: 01/07/2020 07:58   MR ANGIO HEAD WO CONTRAST  Result Date: 01/07/2020 CLINICAL DATA:  Neuro deficit, acute, stroke suspected.  Additional history provided: Right-sided weakness, aphasia. EXAM: MRI HEAD WITHOUT CONTRAST MRA HEAD WITHOUT CONTRAST TECHNIQUE: Multiplanar, multiecho pulse sequences of the brain and surrounding structures were obtained without intravenous contrast. Angiographic images of the head were obtained using MRA technique without contrast. COMPARISON:  Noncontrast head CT performed earlier the same day 01/07/2020 FINDINGS: MRI HEAD FINDINGS Brain: Mild-to-moderate scattered T2/FLAIR hyperintensity within the cerebral white matter is nonspecific, but consistent with chronic small vessel ischemic disease. Findings are advanced for age. Mild chronic small vessel ischemic changes are also present within the pons. Mild generalized parenchymal atrophy. There is no acute infarct. No evidence of intracranial mass. No chronic intracranial blood products. No extra-axial fluid collection. No midline shift. Vascular: Reported below. Skull and upper cervical spine: No focal marrow lesion. Sinuses/Orbits: Visualized orbits show no acute finding. Mild ethmoid and maxillary sinus mucosal thickening. No significant mastoid effusion. MRA HEAD FINDINGS The intracranial internal carotid arteries are patent without significant stenosis. The M1 middle cerebral arteries are patent without significant stenosis. Atherosclerotic irregularity of the M2 and more distal MCA branch vessels bilaterally. No M2 proximal branch occlusion or high-grade proximal stenosis is identified The anterior cerebral arteries are patent. Apparent moderate focal stenosis within the distal A2 left anterior cerebral artery. No intracranial aneurysm is identified. The dominant intracranial left vertebral artery is patent without significant stenosis. The non dominant intracranial right vertebral artery is only faintly seen beyond the origin of the right PICA and this may be due to developmentally small vessel size and/or stenosis. The basilar artery is patent without  significant stenosis. The posterior cerebral arteries are patent without significant proximal stenosis. Posterior communicating arteries are hypoplastic or absent bilaterally. IMPRESSION: MRI brain: 1. No evidence of acute intracranial abnormality, including acute infarction. 2. Mild/moderate chronic small vessel ischemic disease which is advanced for age. 3. Mild generalized parenchymal atrophy. 4. Mild ethmoid and maxillary sinus mucosal thickening. MRA head: 1. No intracranial large vessel occlusion. 2. Atherosclerotic irregularity of the M2 and more distal MCA branch vessels bilaterally. No M2 proximal branch occlusion or high-grade proximal stenosis is identified. 3. Moderate focal stenosis within the distal A2 left anterior cerebral artery. 4. The non-dominant right vertebral artery is only faintly appreciated beyond the origin of the right PICA, which may be due to developmentally small vessel size and/or stenosis. Electronically Signed   By: Jackey LogeKyle  Golden DO   On: 01/07/2020 13:33   MR BRAIN WO CONTRAST  Result Date: 01/07/2020 CLINICAL DATA:  Neuro deficit, acute, stroke suspected. Additional history provided: Right-sided weakness, aphasia. EXAM: MRI HEAD WITHOUT CONTRAST MRA HEAD WITHOUT CONTRAST TECHNIQUE: Multiplanar, multiecho pulse sequences of the brain and surrounding structures were obtained without intravenous  contrast. Angiographic images of the head were obtained using MRA technique without contrast. COMPARISON:  Noncontrast head CT performed earlier the same day 01/07/2020 FINDINGS: MRI HEAD FINDINGS Brain: Mild-to-moderate scattered T2/FLAIR hyperintensity within the cerebral white matter is nonspecific, but consistent with chronic small vessel ischemic disease. Findings are advanced for age. Mild chronic small vessel ischemic changes are also present within the pons. Mild generalized parenchymal atrophy. There is no acute infarct. No evidence of intracranial mass. No chronic intracranial  blood products. No extra-axial fluid collection. No midline shift. Vascular: Reported below. Skull and upper cervical spine: No focal marrow lesion. Sinuses/Orbits: Visualized orbits show no acute finding. Mild ethmoid and maxillary sinus mucosal thickening. No significant mastoid effusion. MRA HEAD FINDINGS The intracranial internal carotid arteries are patent without significant stenosis. The M1 middle cerebral arteries are patent without significant stenosis. Atherosclerotic irregularity of the M2 and more distal MCA branch vessels bilaterally. No M2 proximal branch occlusion or high-grade proximal stenosis is identified The anterior cerebral arteries are patent. Apparent moderate focal stenosis within the distal A2 left anterior cerebral artery. No intracranial aneurysm is identified. The dominant intracranial left vertebral artery is patent without significant stenosis. The non dominant intracranial right vertebral artery is only faintly seen beyond the origin of the right PICA and this may be due to developmentally small vessel size and/or stenosis. The basilar artery is patent without significant stenosis. The posterior cerebral arteries are patent without significant proximal stenosis. Posterior communicating arteries are hypoplastic or absent bilaterally. IMPRESSION: MRI brain: 1. No evidence of acute intracranial abnormality, including acute infarction. 2. Mild/moderate chronic small vessel ischemic disease which is advanced for age. 3. Mild generalized parenchymal atrophy. 4. Mild ethmoid and maxillary sinus mucosal thickening. MRA head: 1. No intracranial large vessel occlusion. 2. Atherosclerotic irregularity of the M2 and more distal MCA branch vessels bilaterally. No M2 proximal branch occlusion or high-grade proximal stenosis is identified. 3. Moderate focal stenosis within the distal A2 left anterior cerebral artery. 4. The non-dominant right vertebral artery is only faintly appreciated beyond the  origin of the right PICA, which may be due to developmentally small vessel size and/or stenosis. Electronically Signed   By: Jackey Loge DO   On: 01/07/2020 13:33    Procedures Procedures (including critical care time)  Medications Ordered in ED Medications  dextrose 50 % solution 50 mL (50 mLs Intravenous Given 01/07/20 0086)    ED Course  I have reviewed the triage vital signs and the nursing notes.  Pertinent labs & imaging results that were available during my care of the patient were reviewed by me and considered in my medical decision making (see chart for details).  Clinical Course as of Jan 07 1440  Tue Jan 07, 2020  0656 CBP for EMS was 113, in ED cbg is 50. 1 amp of 50 given   [KA]  0718 Repeat cbg is 136   [KA]  0824 I ambulated patient and he did so without difficulty. Normal grip strength in all extremities   [KA]    Clinical Course User Index [KA] Lizvet Chunn, Caroleen Hamman, PA-C   MDM Rules/Calculators/A&P                      History provided by patient and spouse with additional history obtained from chart review.    Patient seen and examined. Patient presents awake, alert, hemodynamically stable, afebrile, non toxic.He is slightly tachycardic 102 during exam. His CBG with EMS was 113. When checked  during my exam it is 45. His speech is clear, no aphasia appreciated. CN 2-12 intact. He has 4/5 strength in right lower extremity compared to right and left upper and left lower extremities. Amp of 50 given and glucose 136 when rechecked. Stroke work up initiated without activation of code stroke given last known normal was 7pm last night.  Ct head is negative for bleed, no acute abnormalities. Labs show no leukocytosis, hemoglobin 12.8 is consistent with his baseline. Bicarb 21 with anion gap of 16, patient does look dehydrated suggesting possible cause. Mild hyponatremia 131, no other severe electrolyte derangements, no renal insufficiency. INR/PTT normal.  Ethanol level is  elevated at 63. EKG without ischemic changes. Patient has ABCD2 score of 6. The patient was discussed with and seen by Dr. Wilkie Aye who agrees with the treatment plan.  Case discussed with on call neurologist Dr. Otelia Limes who recommends MR and MRA brain to r/o stroke. Imaging is negative for any acute findings. Patient updated with results and is eager to be discharged home. He has no neuro deficits, speech is clear and he ambulates with steady gait.   The patient appears reasonably screened and/or stabilized for discharge and I doubt any other medical condition or other [facility] requiring further screening, evaluation, or treatment in the ED at this time prior to discharge. The patient is safe for discharge with strict return precautions discussed. Recommend close pcp follow up for hypoglycemia. Patient and spouse are agreeable with plan of care. The patient was discussed with and seen by Dr. Wilkie Aye who agrees with the treatment plan.   Portions of this note were generated with Scientist, clinical (histocompatibility and immunogenetics). Dictation errors may occur despite best attempts at proofreading.    Final Clinical Impression(s) / ED Diagnoses Final diagnoses:  Hypoglycemia    Rx / DC Orders ED Discharge Orders    None       Sherene Sires, PA-C 01/07/20 1441    Shon Baton, MD 01/07/20 2307

## 2020-02-14 ENCOUNTER — Telehealth: Payer: Self-pay | Admitting: *Deleted

## 2020-02-14 NOTE — Telephone Encounter (Signed)
Left message for patient to call to schedule CTA chest/aorta due in July 2021.

## 2020-03-02 NOTE — Telephone Encounter (Signed)
Left message for patient to call to discuss scheduling CTA chest aorta due in July

## 2020-03-06 ENCOUNTER — Encounter: Payer: Self-pay | Admitting: Physician Assistant

## 2020-07-21 ENCOUNTER — Other Ambulatory Visit: Payer: Self-pay | Admitting: *Deleted

## 2020-07-23 ENCOUNTER — Other Ambulatory Visit (HOSPITAL_COMMUNITY): Payer: Self-pay | Admitting: Neurosurgery

## 2020-07-23 ENCOUNTER — Ambulatory Visit (HOSPITAL_COMMUNITY)
Admission: RE | Admit: 2020-07-23 | Discharge: 2020-07-23 | Disposition: A | Discharge status: Home / Self Care | Payer: No Typology Code available for payment source | Source: Ambulatory Visit | Attending: Neurosurgery | Admitting: Neurosurgery

## 2020-07-23 ENCOUNTER — Other Ambulatory Visit: Payer: Self-pay

## 2020-07-23 DIAGNOSIS — M79604 Pain in right leg: Secondary | ICD-10-CM

## 2020-07-23 DIAGNOSIS — M79605 Pain in left leg: Secondary | ICD-10-CM | POA: Insufficient documentation

## 2021-02-23 ENCOUNTER — Emergency Department (HOSPITAL_COMMUNITY)
Admission: EM | Admit: 2021-02-23 | Discharge: 2021-02-23 | Disposition: A | Discharge status: Home / Self Care | Payer: No Typology Code available for payment source | Attending: Emergency Medicine | Admitting: Emergency Medicine

## 2021-02-23 ENCOUNTER — Encounter (HOSPITAL_COMMUNITY): Payer: Self-pay

## 2021-02-23 ENCOUNTER — Other Ambulatory Visit: Payer: Self-pay

## 2021-02-23 ENCOUNTER — Emergency Department (HOSPITAL_COMMUNITY): Payer: No Typology Code available for payment source

## 2021-02-23 DIAGNOSIS — Z7982 Long term (current) use of aspirin: Secondary | ICD-10-CM | POA: Insufficient documentation

## 2021-02-23 DIAGNOSIS — I10 Essential (primary) hypertension: Secondary | ICD-10-CM | POA: Insufficient documentation

## 2021-02-23 DIAGNOSIS — M546 Pain in thoracic spine: Secondary | ICD-10-CM | POA: Diagnosis not present

## 2021-02-23 DIAGNOSIS — I2510 Atherosclerotic heart disease of native coronary artery without angina pectoris: Secondary | ICD-10-CM | POA: Diagnosis not present

## 2021-02-23 DIAGNOSIS — W19XXXA Unspecified fall, initial encounter: Secondary | ICD-10-CM

## 2021-02-23 DIAGNOSIS — Z79899 Other long term (current) drug therapy: Secondary | ICD-10-CM | POA: Insufficient documentation

## 2021-02-23 DIAGNOSIS — Y990 Civilian activity done for income or pay: Secondary | ICD-10-CM | POA: Diagnosis not present

## 2021-02-23 DIAGNOSIS — Z87891 Personal history of nicotine dependence: Secondary | ICD-10-CM | POA: Diagnosis not present

## 2021-02-23 DIAGNOSIS — W01198A Fall on same level from slipping, tripping and stumbling with subsequent striking against other object, initial encounter: Secondary | ICD-10-CM | POA: Insufficient documentation

## 2021-02-23 DIAGNOSIS — Z7984 Long term (current) use of oral hypoglycemic drugs: Secondary | ICD-10-CM | POA: Insufficient documentation

## 2021-02-23 DIAGNOSIS — S2243XA Multiple fractures of ribs, bilateral, initial encounter for closed fracture: Secondary | ICD-10-CM | POA: Diagnosis not present

## 2021-02-23 DIAGNOSIS — E119 Type 2 diabetes mellitus without complications: Secondary | ICD-10-CM | POA: Diagnosis not present

## 2021-02-23 DIAGNOSIS — S299XXA Unspecified injury of thorax, initial encounter: Secondary | ICD-10-CM | POA: Diagnosis present

## 2021-02-23 MED ORDER — HYDROCODONE-ACETAMINOPHEN 7.5-325 MG PO TABS: 1.0000 | ORAL_TABLET | Freq: Four times a day (QID) | ORAL | 0 refills | Status: DC | PRN

## 2021-02-23 MED ORDER — KETOROLAC TROMETHAMINE 30 MG/ML IJ SOLN
30.0000 mg | Freq: Once | INTRAMUSCULAR | Status: AC
  Administered 2021-02-23: 30 mg via INTRAMUSCULAR
  Filled 2021-02-23: qty 1

## 2021-02-23 MED ORDER — HYDROMORPHONE HCL 1 MG/ML IJ SOLN
0.5000 mg | Freq: Once | INTRAMUSCULAR | Status: AC
  Administered 2021-02-23: 0.5 mg via INTRAMUSCULAR
  Filled 2021-02-23: qty 1

## 2021-02-23 NOTE — ED Triage Notes (Signed)
Per EMS- patient fell at work today and 2 hours later patient had increased pain to the mid back. Patient has a significant back pain history. Patient hit his head, but no LOC or blurred vision.

## 2021-02-23 NOTE — ED Provider Notes (Signed)
[facility] Provider Note   CSN: [id number] Arrival date & time: 02/23/21  1336     History Chief Complaint  Patient presents with   Fall   Back Pain    Devon Wood is a 60 y.o. male.   Fall Pertinent negatives include no abdominal pain and no shortness of breath.  Back Pain Associated symptoms: no abdominal pain and no weakness   Patient presents with back pain after fall.  States that he was at work today and slipped on some floor stripper.  States he landed flat onto his back.  Was doing okay but then a couple hours later developed more pain in the mid back.  States he will get some episodes of sharp pain.  States it hurts to move.  States it hurts to breathe.  Pain in the upper to mid back and also on both sides.  Patient is only on aspirin.  States that he did hit his head and was a little dizzy initially but that is cleared up.  No neck pain.  Has known severe cervical spine disease and is due to have a surgery.  States he cannot raise his arms up over his head at baseline.  No other arm or extremity weakness.    Past Medical History:  Diagnosis Date   Anxiety    Arthritis    Bronchitis    hx of;last time couple of yrs ago   Bronchitis    hx of   CAD (coronary artery disease) 10/10/2013   2004 2.0 x 25 mm drug-eluting cypher stent to LAD    Chronic back pain    L3 buldging disc;DDD   Complication of anesthesia    20-30 second asystole episode during induction  04/2003,  was on b-blocker tx at the time, found to have 80% LAD lesion s/p stent (but unclear if CAD was the direct cause)    Coronary artery disease 2004   stent placement, sees Dr "C" [facility]   Depression    Diabetes mellitus    takes Glipizide and Metformin daily   DM type 2 causing complication (HCC) 10/10/2013   GERD (gastroesophageal reflux disease)    takes Pepcid daily   Gout    hx of   History of kidney stones    HTN (hypertension) 10/10/2013    Hypercholesteremia    takes Simvastatin daily   Hypertension    takes Lisinopril daily   Joint pain    Kidney stones    "active ones at present"on the left side   Mixed hyperlipidemia 10/10/2013   Morbid obesity (HCC) 10/10/2013   Nocturia    Peptic ulcer     Patient Active Problem List   Diagnosis Date Noted   Painful orthopaedic hardware (HCC) 10/24/2018   Dyslipidemia (high LDL; low HDL) 09/29/2018   Spinal stenosis 04/05/2017   Severe obesity (BMI 35.0-35.9 with comorbidity) (HCC) 04/04/2017   CAD (coronary artery disease) 10/10/2013   Mixed hyperlipidemia 10/10/2013   DM type 2 causing complication (HCC) 10/10/2013   Essential hypertension 10/10/2013   Morbid obesity (HCC) 10/10/2013   Lumbar spinal stenosis 10/10/2013    Past Surgical History:  Procedure Laterality Date   BACK SURGERY     CARDIAC CATHETERIZATION  11/27/2009   widely patent stent   COLONOSCOPY WITH PROPOFOL N/A 03/17/2015   Procedure: COLONOSCOPY WITH PROPOFOL;  Surgeon: Charolett BumpersMartin K Johnson, MD;  Location: [facility];  Service: Endoscopy;  Laterality: N/A;   CORONARY STENT  PLACEMENT  05/30/2003   proximal LAD   ESOPHAGOGASTRODUODENOSCOPY  in the 90's   HAND SURGERY  1980   right   LUMBAR DISC SURGERY  2002   L5   LUMBAR FUSION  2004   NM MYOCAR PERF WALL MOTION  05/19/2004   no evidence of ischemia   TONSILLECTOMY  1969   US ECHOCARDIOGRAPHY  04/30/2003   mildly dilated aortic root,mild LA enlargement,mild concentric LVH,trivial mitral and tricuspid regurg.,mild mitral annular ca+       Family History  Problem Relation Age of Onset   COPD Mother    Heart attack Father    COPD Brother    COPD Brother    Dementia Maternal Grandmother    Cancer Maternal Grandfather    Heart failure Paternal Grandmother    Heart attack Paternal Grandfather    Hypertension Sister    Anesthesia problems Neg Hx    Hypotension Neg Hx    Malignant hyperthermia Neg Hx    Pseudochol deficiency Neg Hx     Social  History   Tobacco Use   Smoking status: Former    Packs/day: 0.50    Years: 4.00    Pack years: 2.00    Types: Cigarettes    Quit date: 1984    Years since quitting: 38.4   Smokeless tobacco: Current    Types: Chew  Vaping Use   Vaping Use: Never used  Substance Use Topics   Alcohol use: Yes    Comment: occasionally   Drug use: No    Home Medications Prior to Admission medications   Medication Sig Start Date End Date Taking? Authorizing Provider  HYDROcodone-acetaminophen (NORCO) 7.5-325 MG tablet Take 1-2 tablets by mouth every 6 (six) hours as needed for moderate pain. 02/23/21  Yes Benjiman Core, MD  allopurinol (ZYLOPRIM) 100 MG tablet Take 100 mg by mouth daily.     [provider]  aspirin 81 MG tablet Take 81 mg by mouth daily.    [provider]  colchicine 0.6 MG tablet Take 0.6 mg by mouth daily as needed. For gout flares     [provider]  cyclobenzaprine (FLEXERIL) 10 MG tablet Take 1 tablet (10 mg total) by mouth 3 (three) times daily as needed for muscle spasms. 04/07/17   Donalee Citrin, MD  escitalopram (LEXAPRO) 10 MG tablet Take 10 mg by mouth daily. 10/07/13   [provider]  famotidine (PEPCID) 20 MG tablet Take 20 mg by mouth daily.     [provider]  fluticasone (FLONASE) 50 MCG/ACT nasal spray Place 2 sprays into both nostrils daily.    [provider]  glipiZIDE (GLUCOTROL) 5 MG tablet Take 5 mg by mouth daily before breakfast.     [provider]  indomethacin (INDOCIN) 25 MG capsule Take 25 mg by mouth 2 (two) times daily as needed. For gout flares    [provider]  Krill Oil 300 MG CAPS Take 1 capsule by mouth daily.    [provider]  lisinopril-hydrochlorothiazide (PRINZIDE,ZESTORETIC) 20-12.5 MG per tablet Take 1 tablet by mouth daily. 09/20/13   [provider]  meloxicam (MOBIC) 15 MG tablet Take 15 mg by mouth daily.    [provider]  metFORMIN  (GLUCOPHAGE) 500 MG tablet Take 1,000 mg by mouth 2 (two) times daily with a meal.     [provider]  Potassium 99 MG TABS Take 1 tablet by mouth daily.    [provider]  simvastatin (  ZOCOR) 40 MG tablet Take 20 mg by mouth daily.      [provider]  sitaGLIPtin (JANUVIA) 100 MG tablet Take 100 mg by mouth daily.    [provider]    Allergies    Erythromycin  Review of Systems   Review of Systems  Constitutional:  Negative for appetite change.  HENT:  Negative for congestion.   Respiratory:  Negative for shortness of breath.   Gastrointestinal:  Negative for abdominal pain.  Genitourinary:  Negative for flank pain.  Musculoskeletal:  Positive for back pain.  Skin:  Negative for pallor and wound.  Neurological:  Negative for weakness.  Psychiatric/Behavioral:  Negative for confusion.    Physical Exam Updated Vital Signs BP 139/89   Pulse (!) 105   Temp 99.6 F (37.6 C) (Oral)   Resp 16   Ht  (1.702 m)   Wt 99.8 kg   SpO2 96%   BMI 34.46 kg/m   Physical Exam Vitals and nursing note reviewed.  HENT:     Head: Atraumatic.  Eyes:     Pupils: Pupils are equal, round, and reactive to light.  Neck:     Comments: No midline tenderness. Cardiovascular:     Rate and Rhythm: Normal rate.  Pulmonary:     Breath sounds: Normal breath sounds. No stridor. No wheezing.     Comments: Tenderness to posterior Chest:     Chest wall: Tenderness present.  Abdominal:     Tenderness: There is no abdominal tenderness.  Musculoskeletal:        General: Tenderness present.     Cervical back: Neck supple.     Comments: Tenderness over bilateral somewhat lateral posterior mid back.  No deformity.  Equal breath sounds.  Mild tenderness over thoracic spine.  No deformity.  Skin:    General: Skin is warm.     Capillary Refill: Capillary refill takes less than 2 seconds.  Neurological:     Mental Status: He is alert and oriented to person,  place, and time.     Comments: Unable to raise arm overhead at baseline    ED Results / Procedures / Treatments   Labs (all labs ordered are listed, but only abnormal results are displayed) Labs Reviewed - No data to display  EKG None  Radiology CT Chest Wo Contrast  Result Date: 02/23/2021 CLINICAL DATA:  Status post fall.  Evaluate for rib fracture. EXAM: CT CHEST WITHOUT CONTRAST TECHNIQUE: Multidetector CT imaging of the chest was performed following the standard protocol without IV contrast. COMPARISON:  CT angio chest 11/28/2009 FINDINGS: Cardiovascular: Normal heart size. Aortic atherosclerosis. Coronary artery calcifications identified. No pericardial effusion. Mediastinum/Nodes: No enlarged mediastinal or axillary lymph nodes. Thyroid gland, trachea, and esophagus demonstrate no significant findings. Lungs/Pleura: Asymmetric elevation of the left hemidiaphragm is identified. No pleural effusion, atelectasis, airspace consolidation or pneumothorax. No suspicious nodule or mass. Upper Abdomen: Multiple left renal stones. Aortic atherosclerosis. No acute abnormality noted. Musculoskeletal: Thoracolumbar spondylosis identified. Status post L2 through S1 fusion. Marked degenerative changes noted involving the right glenohumeral joint. -Acute, nondisplaced right posterior sixth and seventh ribs, image 61/7 and image 72/7. -Subacute to subacute right anterior seventh rib and left posterior twelfth rib fractures noted. -Acute fractures involves the posterior aspect of the left seventh, eighth, ninth, tenth, eleventh, and twelfth ribs. Multi level degenerative disc disease noted throughout the thoracic spine. IMPRESSION: 1. Acute, nondisplaced right posterior sixth and seventh rib fractures. Acute left posterior seventh, eighth, ninth, tenth,  eleventh, and twelfth rib fractures. 2. Subacute fractures involve the right seventh rib and left twelfth rib. 3. Coronary artery calcifications noted. 4.  Nonobstructing left renal stones. Aortic Atherosclerosis (ICD10-I70.0). Electronically Signed   By: Signa Kell M.D.   On: 02/23/2021 19:39   CT T-SPINE NO CHARGE  Result Date: 02/23/2021 CLINICAL DATA:  60 year old male with fall and trauma to the thoracic spine. EXAM: CT THORACIC SPINE WITHOUT CONTRAST TECHNIQUE: Multidetector CT images of the thoracic were obtained using the standard protocol without intravenous contrast. COMPARISON:  Chest CT dated 02/23/2021. FINDINGS: Alignment: No acute subluxation. Vertebrae: No acute vertebral body fracture. There is osteopenia. The visualized posterior elements are intact. Partially visualized fractures of the posterior left eighth-tenth ribs. These fractures are better seen on the chest CT. There is subacute or old appearing fracture of the posterior left twelfth rib with nonunion. Paraspinal and other soft tissues: No perispinal fluid collection or hematoma. Please refer to the chest CT report for intrathoracic findings. Disc levels: Multilevel degenerative changes with disc space narrowing and endplate irregularity and spurring. Disc spacers noted at L2-L3 and L3-L4. IMPRESSION: 1. No acute/traumatic thoracic spine pathology. 2. Partially visualized acute fractures of the posterior left eighth-tenth ribs. Subacute or old appearing fracture of the posterior left twelfth rib with nonunion. Electronically Signed   By: Elgie Collard M.D.   On: 02/23/2021 20:19    Procedures Procedures   Medications Ordered in ED Medications  ketorolac (TORADOL) 30 MG/ML injection 30 mg (30 mg Intramuscular Given 02/23/21 1826)  HYDROmorphone (DILAUDID) injection 0.5 mg (0.5 mg Intramuscular Given 02/23/21 1826)    ED Course  I have reviewed the triage vital signs and the nursing notes.  Pertinent labs & imaging results that were available during my care of the patient were reviewed by me and considered in my medical decision making (see chart for details).    MDM  Rules/Calculators/A&P                          Patient with mechanical fall.  Landed backwards slipping.  Complaining of posterior chest pain.  No abdominal pain.  Hit head but no deficits or headache at this time.  Nonfocal exam.  Lungs are clear but hurts to breathe.  Initial tachycardia improved.  Not hypoxic.  Found to have 8 rib fractures.  No pneumothorax.  Discussed with patient he does not want to be admitted to the hospital.  Feels if his pain can be controlled at home.  States he is feeling better and thinks he will be able to manage it better at home sleeping in his own bed.  Discussed with Dr. Donell Beers and states the patient can follow-up in the trauma clinic if needed.  If symptoms worsen can return to [facility].  Will discharge home with pain medicines.  States the hydrocodone 7-1/2 pills have worked in the past for his back pain.  We will represcribe this.  We will also give incentive spirometer.  Doubt intra-abdominal injury although was given instructions in terms of follow-up and what to watch for such as hematuria.  Will discharge home. Final Clinical Impression(s) / ED Diagnoses Final diagnoses:  Fall  Closed fracture of multiple ribs of both sides, initial encounter    Rx / DC Orders ED Discharge Orders          Ordered    HYDROcodone-acetaminophen (NORCO) 7.5-325 MG tablet  Every 6 hours PRN  02/23/21 2049             Benjiman Core, MD 02/23/21 2052

## 2021-02-23 NOTE — ED Notes (Addendum)
Patient instructed on use of incentive spirometer. Teach back method used. Patient verbalizes understanding. 

## 2021-02-23 NOTE — Discharge Instructions (Addendum)
Take the pain medicine and your muscle relaxer as needed.  Use the spirometer to help keep your lungs open.  If you develop more trouble breathing uncontrolled pains or fevers.  Return to the hospital, ideally come in hospital.  Also watch for blood in the urine or abdominal pain.

## 2021-04-22 ENCOUNTER — Other Ambulatory Visit: Payer: Self-pay | Admitting: Family Medicine

## 2021-04-22 ENCOUNTER — Ambulatory Visit: Payer: Self-pay

## 2021-04-22 ENCOUNTER — Other Ambulatory Visit: Payer: Self-pay

## 2021-04-22 DIAGNOSIS — S20212D Contusion of left front wall of thorax, subsequent encounter: Secondary | ICD-10-CM

## 2021-05-27 ENCOUNTER — Ambulatory Visit
Admission: RE | Admit: 2021-05-27 | Discharge: 2021-05-27 | Disposition: A | Discharge status: Home / Self Care | Payer: Worker's Compensation | Source: Ambulatory Visit | Attending: Family Medicine | Admitting: Family Medicine

## 2021-05-27 ENCOUNTER — Other Ambulatory Visit: Payer: Self-pay | Admitting: Family Medicine

## 2021-05-27 DIAGNOSIS — R0781 Pleurodynia: Secondary | ICD-10-CM

## 2021-07-08 ENCOUNTER — Other Ambulatory Visit: Payer: Self-pay | Admitting: Neurosurgery

## 2021-07-08 ENCOUNTER — Telehealth: Payer: Self-pay

## 2021-07-08 NOTE — Telephone Encounter (Signed)
   [facility] Pre-operative Risk Assessment    Patient Name: Devon Wood  DOB: 28-Mar-1961 MRN: [id number]  HEARTCARE STAFF:  - IMPORTANT!!!!!! Under Visit Info/Reason for Call, type in Other and utilize the format Clearance MM/DD/YY or Clearance TBD. Do not use dashes or single digits. - Please review there is not already an duplicate clearance open for this procedure. - If request is for dental extraction, please clarify the # of teeth to be extracted. - If the patient is currently at the dentist's office, call Pre-Op Callback Staff (MA/nurse) to input urgent request.  - If the patient is not currently in the dentist office, please route to the Pre-Op pool.  Request for surgical clearance:  What type of surgery is being performed? Cervical Fusion   When is this surgery scheduled? 07/28/2021  What type of clearance is required (medical clearance vs. Pharmacy clearance to hold med vs. Both)? Medical   Are there any medications that need to be held prior to surgery and how long? None  Practice name and name of physician performing surgery? [facility] w/Dr. Kary Kos  What is the office phone number? [phone]   7.   What is the office fax number? [phone]  8.   Anesthesia type (None, local, MAC, general) ? General    Zebedee Iba 07/08/2021, 4:34 PM  _________________________________________________________________   (provider comments below)

## 2021-07-09 NOTE — Telephone Encounter (Signed)
   Name: Devon Wood  DOB: 09/28/1960  MRN: [id number]  Primary Cardiologist: Thurmon Fair, MD  Chart reviewed as part of pre-operative protocol coverage. Because of Macgregor Aeschliman Wadas's past medical history and time since last visit, he will require a follow-up visit in order to better assess preoperative cardiovascular risk.  Pre-op covering staff: - Please schedule appointment and call patient to inform them. Please add "pre-op clearance" to the appointment notes so provider is aware. - Please contact requesting surgeon's office via preferred method (i.e, phone, fax) to inform them of need for appointment prior to surgery.   Beatriz Stallion, PA-C  07/09/2021, 2:00 PM

## 2021-07-09 NOTE — Telephone Encounter (Signed)
Left very detailed message that the pt will need an appt for pre op clearance. I left detailed message that I will tentatively schedule with Diamantina Monks, [facility] 07/21/21 @ 12:15 for pre op clearance though I do need him to call back and either confirm appt 07/21/21 or change if needed. Left message that schedules are full and may be hard to find another appt before his surgery date 07/28/21. Left message appt 07/21/21 @ 12:15 at the [facility] location with Jacolyn Reedy, PAC.

## 2021-07-12 NOTE — Telephone Encounter (Signed)
Pt called back and confirmed the pre op appt with Jacolyn Reedy, [facility] 07/21/21. I will forward notes to [facility] for upcoming appt. Will send FYI to requesting office pt has appt 07/21/21.

## 2021-07-14 NOTE — Progress Notes (Signed)
Cardiology Office Note    Date:  07/21/2021   ID:  Devon Wood, DOB 12/20/1960, MRN [id number]   PCP:  Georgann Housekeeper, MD   [facility]  Cardiologist:  Thurmon Fair, MD   Advanced Practice Provider:  No care team member to display Electrophysiologist:  None   [phone]   Chief Complaint  Patient presents with   Pre-op Exam     History of Present Illness:  Devon Wood is a 60 y.o. male with a hx of CAD, chronic back pain, DM 2, hypertension, hyperlipidemia, and morbid obesity.  Patient had a remote stent placement to LAD in 2004 by Dr. Jenne Wood.  Repeat cardiac catheterization in 2011 showed widely patent stent.  A previous remote echocardiogram reported dilated ascending aorta, however this is not confirmed by chest CT.   Patient last seen in our office 10/02/2019 by [name] who recommended CT angiogram to assess dilated ascending aorta otherwise doing well. CTA never done. CT 02/23/21 didn't mention it but does have aortic atherosclerosis and coronary art calcifications present   Preop clearance for cervical fusion 07/28/2021 by Dr. Donalee Citrin. Denies chest pain, palpitations, dyspnea, dizziness or presyncope. Fell in June and broke 8 ribs. 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 Walks 8-12 miles/day in his job. BP has been up. Eating a lot of fast food. HR running fast-couldn't give blood because they couldn't get HR below 100/m. Drinks 2 cups coffee, 1- 24 ounce tea, drinks a lot of diet gingerale, occasional diet coke daily.   Past Medical History:  Diagnosis Date   Anxiety    Arthritis    Bronchitis    hx of;last time couple of yrs ago   Bronchitis    hx of   CAD (coronary artery disease) 10/10/2013   2004 2.0 x 25 mm drug-eluting cypher stent to LAD    Chronic back pain    L3 buldging disc;DDD   Complication of anesthesia    20-30 second asystole episode during induction  04/2003,  was on b-blocker tx at the time, found to have 80% LAD  lesion s/p stent (but unclear if CAD was the direct cause)    Coronary artery disease 2004   stent placement, sees Dr "C" [facility]   Depression    Diabetes mellitus    takes Glipizide and Metformin daily   DM type 2 causing complication (HCC) 10/10/2013   GERD (gastroesophageal reflux disease)    takes Pepcid daily   Gout    hx of   History of kidney stones    HTN (hypertension) 10/10/2013   Hypercholesteremia    takes Simvastatin daily   Hypertension    takes Lisinopril daily   Joint pain    Kidney stones    "active ones at present"on the left side   Mixed hyperlipidemia 10/10/2013   Morbid obesity (HCC) 10/10/2013   Nocturia    Peptic ulcer     Past Surgical History:  Procedure Laterality Date   BACK SURGERY     CARDIAC CATHETERIZATION  11/27/2009   widely patent stent   COLONOSCOPY WITH PROPOFOL N/A 03/17/2015   Procedure: COLONOSCOPY WITH PROPOFOL;  Surgeon: Charolett Bumpers, MD;  Location: [facility];  Service: Endoscopy;  Laterality: N/A;   CORONARY STENT PLACEMENT  05/30/2003   proximal LAD   ESOPHAGOGASTRODUODENOSCOPY  in the 90's   HAND SURGERY  1980   right   LUMBAR DISC SURGERY  2002  L5   LUMBAR FUSION  2004   NM MYOCAR PERF WALL MOTION  05/19/2004   no evidence of ischemia   TONSILLECTOMY  1969   US ECHOCARDIOGRAPHY  04/30/2003   mildly dilated aortic root,mild LA enlargement,mild concentric LVH,trivial mitral and tricuspid regurg.,mild mitral annular ca+    Current Medications: Current Meds  Medication Sig   allopurinol (ZYLOPRIM) 100 MG tablet Take 100 mg by mouth daily.    aspirin 81 MG tablet Take 81 mg by mouth daily.   colchicine 0.6 MG tablet Take 0.6 mg by mouth daily as needed. For gout flares    cyclobenzaprine (FLEXERIL) 10 MG tablet Take 1 tablet (10 mg total) by mouth 3 (three) times daily as needed for muscle spasms.   escitalopram (LEXAPRO) 10 MG tablet Take 10 mg by mouth daily.   famotidine (PEPCID) 20 MG tablet Take 20 mg  by mouth daily.    fluticasone (FLONASE) 50 MCG/ACT nasal spray Place 2 sprays into both nostrils daily.   HYDROcodone-acetaminophen (NORCO) 7.5-325 MG tablet Take 1-2 tablets by mouth every 6 (six) hours as needed for moderate pain.   indomethacin (INDOCIN) 25 MG capsule Take 25 mg by mouth 2 (two) times daily as needed. For gout flares   lisinopril-hydrochlorothiazide (PRINZIDE,ZESTORETIC) 20-12.5 MG per tablet Take 1 tablet by mouth daily.   meloxicam (MOBIC) 15 MG tablet Take 15 mg by mouth daily.   metFORMIN (GLUCOPHAGE) 500 MG tablet Take 1,000 mg by mouth 2 (two) times daily with a meal.    metoprolol succinate (TOPROL XL) 25 MG 24 hr tablet Take 1 tablet (25 mg total) by mouth daily.   Potassium 99 MG TABS Take 1 tablet by mouth daily.   simvastatin (ZOCOR) 40 MG tablet Take 20 mg by mouth daily.     sitaGLIPtin (JANUVIA) 100 MG tablet Take 100 mg by mouth daily.     Allergies:   Erythromycin   Social History   Socioeconomic History   Marital status: Married    Spouse name: Not on file   Number of children: Not on file   Years of education: Not on file   Highest education level: Not on file  Occupational History   Not on file  Tobacco Use   Smoking status: Former    Packs/day: 0.50    Years: 4.00    Pack years: 2.00    Types: Cigarettes    Quit date: 53    Years since quitting: 38.9   Smokeless tobacco: Current    Types: Chew  Vaping Use   Vaping Use: Never used  Substance and Sexual Activity   Alcohol use: Yes    Comment: occasionally   Drug use: No   Sexual activity: Yes  Other Topics Concern   Not on file  Social History Narrative   Not on file   Social Determinants of Health   Financial Resource Strain: Not on file  Food Insecurity: Not on file  Transportation Needs: Not on file  Physical Activity: Not on file  Stress: Not on file  Social Connections: Not on file     Family History:  The patient's  family history includes COPD in his brother,  brother, and mother; Cancer in his maternal grandfather; Dementia in his maternal grandmother; Heart attack in his father and paternal grandfather; Heart failure in his paternal grandmother; Hypertension in his sister.   ROS:   Please see the history of present illness.    ROS All other systems reviewed and are negative.  PHYSICAL EXAM:   VS:  BP (!) 140/96   Pulse (!) 108   Ht 5\' 6"  (1.676 m)   Wt 227 lb (103 kg)   SpO2 98%   BMI 36.64 kg/m   Physical Exam  GEN: Obese, in no acute distress  Neck: no JVD, carotid bruits, or masses Cardiac:RRR; no murmurs, rubs, or gallops  Respiratory:  clear to auscultation bilaterally, normal work of breathing GI: soft, nontender, nondistended, + BS Ext: without cyanosis, clubbing, or edema, Good distal pulses bilaterally Neuro:  Alert and Oriented x 3 Psych: euthymic mood, full affect  Wt Readings from Last 3 Encounters:  07/21/21 227 lb (103 kg)  02/23/21 220 lb (99.8 kg)  01/07/20 226 lb (102.5 kg)      Studies/Labs Reviewed:   EKG:  EKG is  ordered today.  The ekg ordered today demonstrates Sinus tachycardia 108/m LAFB poor R wave progression  Recent Labs: No results found for requested labs within last 8760 hours.   Lipid Panel    Component Value Date/Time   CHOL  11/27/2009 1631    110        ATP III CLASSIFICATION:  <200     mg/dL   Desirable  11/29/2009  mg/dL   Borderline High  637-858    mg/dL   High          TRIG >=850 (H) 11/27/2009 1631   HDL 31 (L) 11/27/2009 1631   CHOLHDL 3.5 11/27/2009 1631   VLDL 38 11/27/2009 1631   LDLCALC  11/27/2009 1631    41        Total Cholesterol/HDL:CHD Risk Coronary Heart Disease Risk Table                     Men   Women  1/2 Average Risk   3.4   3.3  Average Risk       5.0   4.4  2 X Average Risk   9.6   7.1  3 X Average Risk  23.4   11.0        Use the calculated Patient Ratio above and the CHD Risk Table to determine the patient's CHD Risk.        ATP III CLASSIFICATION  (LDL):  <100     mg/dL   Optimal  11/29/2009  mg/dL   Near or Above                    Optimal  130-159  mg/dL   Borderline  412-878  mg/dL   High  676-720     mg/dL   Very High    Additional studies/ records that were reviewed today include:   Chest CT 02/23/21 FINDINGS: Cardiovascular: Normal heart size. Aortic atherosclerosis. Coronary artery calcifications identified. No pericardial effusion.   Mediastinum/Nodes: No enlarged mediastinal or axillary lymph nodes. Thyroid gland, trachea, and esophagus demonstrate no significant findings.   Lungs/Pleura: Asymmetric elevation of the left hemidiaphragm is identified. No pleural effusion, atelectasis, airspace consolidation or pneumothorax. No suspicious nodule or mass.   Upper Abdomen: Multiple left renal stones. Aortic atherosclerosis. No acute abnormality noted.   Musculoskeletal: Thoracolumbar spondylosis identified. Status post L2 through S1 fusion.   Marked degenerative changes noted involving the right glenohumeral joint.   -Acute, nondisplaced right posterior sixth and seventh ribs, image 61/7 and image 72/7.   -Subacute to subacute right anterior seventh rib and left posterior twelfth rib fractures noted.   -Acute fractures involves the posterior  aspect of the left seventh, eighth, ninth, tenth, eleventh, and twelfth ribs.   Multi level degenerative disc disease noted throughout the thoracic spine.   IMPRESSION: 1. Acute, nondisplaced right posterior sixth and seventh rib fractures. Acute left posterior seventh, eighth, ninth, tenth, eleventh, and twelfth rib fractures. 2. Subacute fractures involve the right seventh rib and left twelfth rib. 3. Coronary artery calcifications noted. 4. Nonobstructing left renal stones.   Aortic Atherosclerosis (ICD10-I70.0).     Electronically Signed   By: Signa Kell M.D.   On: 02/23/2021 19:39  Risk Assessment/Calculations:         ASSESSMENT:    1. Preoperative  clearance   2. Coronary artery disease involving native coronary artery of native heart without angina pectoris   3. Ascending aorta dilatation (HCC)   4. Essential hypertension   5. Other hyperlipidemia   6. DM type 2 causing complication (HCC)   7. Class 2 severe obesity due to excess calories with serious comorbidity in adult, unspecified BMI (HCC)      PLAN:  In order of problems listed above:  Preop clearance for cervical fusion 07/28/2021 by Dr. Donalee Citrin. Patient has elevated BP and HR today-has been running high for awhile. Decrease caffeine intake and update echo. If ok can proceed with surgery. METS>6. Add Toprol XL 25 mg once daily. Check labs   According to the Revised Cardiac Risk Index (RCRI), his Perioperative Risk of Major Cardiac Event is (%): 0.9  His Functional Capacity in METs is: 6.61 according to the Duke Activity Status Index (DASI).   CAD status post remote stent to the LAD 2000 4 repeat cath in 2011 widely patent stent, calcification on CT 02/2021-no angina, on simvastatin and ASA  Dilated ascending aorta 4.3 cm in 2004 CTA recommended but has not been done-will order   Hypertension BP and HR up-will add low dose Toprol XL 25 mg once daily. Eating a lot of fast food. Decrease caffeine and sodium intake.  Hyperlipidemia-due for labs on Zocor  DM2 on metformin and [place]  Obesity-has gained 10 lbs since he broke his ribs.  Shared Decision Making/Informed Consent        Medication Adjustments/Labs and Tests Ordered: Current medicines are reviewed at length with the patient today.  Concerns regarding medicines are outlined above.  Medication changes, Labs and Tests ordered today are listed in the Patient Instructions below. Patient Instructions  Medication Instructions:  1.Start metoprolol succinate (Toprol XL) 25 mg, take one tablet by mouth daily *If you need a refill on your cardiac medications before your next appointment, please call your  pharmacy*   Lab Work: CMET, CBC, fasting lipid, and TSH, can be done on same day as ECHO If you have labs (blood work) drawn today and your tests are completely normal, you will receive your results only by: MyChart Message (if you have MyChart) OR A paper copy in the mail If you have any lab test that is abnormal or we need to change your treatment, we will call you to review the results.   Testing/Procedures: Your physician has requested that you have an echocardiogram next available, this week if possible. Echocardiography is a painless test that uses sound waves to create images of your heart. It provides your doctor with information about the size and shape of your heart and how well your heart's chambers and valves are working. This procedure takes approximately one hour. There are no restrictions for this procedure.   Non-Cardiac CT Angiography (CTA), is  a special type of CT scan that uses a computer to produce multi-dimensional views of major blood vessels throughout the body. In CT angiography, a contrast material is injected through an IV to help visualize the blood vessels    Follow-Up: At [facility], you and your health needs are our priority.  As part of our continuing mission to provide you with exceptional heart care, we have created designated Provider Care Teams.  These Care Teams include your primary Cardiologist (physician) and Advanced Practice Providers (APPs -  Physician Assistants and Nurse Practitioners) who all work together to provide you with the care you need, when you need it.   Your next appointment:   3-4 month(s)  The format for your next appointment:   In Person  Provider:   Thurmon Fair, MD {   Other Instructions Try to decrease your caffeine intake   Two Gram Sodium Diet 2000 mg  What is Sodium? Sodium is a mineral found naturally in many foods. The most significant source of sodium in the diet is table salt, which is about 40% sodium.   Processed, convenience, and preserved foods also contain a large amount of sodium.  The body needs only 500 mg of sodium daily to function,  A normal diet provides more than enough sodium even if you do not use salt.  Why Limit Sodium? A build up of sodium in the body can cause thirst, increased blood pressure, shortness of breath, and water retention.  Decreasing sodium in the diet can reduce edema and risk of heart attack or stroke associated with high blood pressure.  Keep in mind that there are many other factors involved in these health problems.  Heredity, obesity, lack of exercise, cigarette smoking, stress and what you eat all play a role.  General Guidelines: Do not add salt at the table or in cooking.  One teaspoon of salt contains over 2 grams of sodium. Read food labels Avoid processed and convenience foods Ask your dietitian before eating any foods not dicussed in the menu planning guidelines Consult your physician if you wish to use a salt substitute or a sodium containing medication such as antacids.  Limit milk and milk products to 16 oz (2 cups) per day.  Shopping Hints: READ LABELS!! "Dietetic" does not necessarily mean low sodium. Salt and other sodium ingredients are often added to foods during processing.    Menu Planning Guidelines Food Group Choose More Often Avoid  Beverages (see also the milk group All fruit juices, low-sodium, salt-free vegetables juices, low-sodium carbonated beverages Regular vegetable or tomato juices, commercially softened water used for drinking or cooking  Breads and Cereals Enriched white, wheat, rye and pumpernickel bread, hard rolls and dinner rolls; muffins, cornbread and waffles; most dry cereals, cooked cereal without added salt; unsalted crackers and breadsticks; low sodium or homemade bread crumbs Bread, rolls and crackers with salted tops; quick breads; instant hot cereals; pancakes; commercial bread stuffing; self-rising flower and  biscuit mixes; regular bread crumbs or cracker crumbs  Desserts and Sweets Desserts and sweets mad with mild should be within allowance Instant pudding mixes and cake mixes  Fats Butter or margarine; vegetable oils; unsalted salad dressings, regular salad dressings limited to 1 Tbs; light, sour and heavy cream Regular salad dressings containing bacon fat, bacon bits, and salt pork; snack dips made with instant soup mixes or processed cheese; salted nuts  Fruits Most fresh, frozen and canned fruits Fruits processed with salt or sodium-containing ingredient (some dried fruits are  processed with sodium sulfites        Vegetables Fresh, frozen vegetables and low- sodium canned vegetables Regular canned vegetables, sauerkraut, pickled vegetables, and others prepared in brine; frozen vegetables in sauces; vegetables seasoned with ham, bacon or salt pork  Condiments, Sauces, Miscellaneous  Salt substitute with physician's approval; pepper, herbs, spices; vinegar, lemon or lime juice; hot pepper sauce; garlic powder, onion powder, low sodium soy sauce (1 Tbs.); low sodium condiments (ketchup, chili sauce, mustard) in limited amounts (1 tsp.) fresh ground horseradish; unsalted tortilla chips, pretzels, potato chips, popcorn, salsa (1/4 cup) Any seasoning made with salt including garlic salt, celery salt, onion salt, and seasoned salt; sea salt, rock salt, kosher salt; meat tenderizers; monosodium glutamate; mustard, regular soy sauce, barbecue, sauce, chili sauce, teriyaki sauce, steak sauce, Worcestershire sauce, and most flavored vinegars; canned gravy and mixes; regular condiments; salted snack foods, olives, picles, relish, horseradish sauce, catsup   Food preparation: Try these seasonings Meats:    Pork Sage, onion Serve with applesauce  Chicken Poultry seasoning, thyme, parsley Serve with cranberry sauce  Lamb Curry powder, rosemary, garlic, thyme Serve with mint sauce or jelly  Veal Marjoram, basil  Serve with current jelly, cranberry sauce  Beef Pepper, bay leaf Serve with dry mustard, unsalted chive butter  Fish [place] leaf, dill Serve with unsalted lemon butter, unsalted parsley butter  Vegetables:    Asparagus Lemon juice   Broccoli Lemon juice   Carrots Mustard dressing parsley, mint, nutmeg, glazed with unsalted butter and sugar   Green beans Marjoram, lemon juice, nutmeg,dill seed   Tomatoes Basil, marjoram, onion   Spice /blend for [employer]" 4 tsp ground thyme 1 tsp ground sage 3 tsp ground rosemary 4 tsp ground marjoram   Test your knowledge A product that says "Salt Free" may still contain sodium. True or False Garlic Powder and Hot Pepper Sauce an be used as alternative seasonings.True or False Processed foods have more sodium than fresh foods.  True or False Canned Vegetables have less sodium than froze True or False   WAYS TO DECREASE YOUR SODIUM INTAKE Avoid the use of added salt in cooking and at the table.  Table salt (and other prepared seasonings which contain salt) is probably one of the greatest sources of sodium in the diet.  Unsalted foods can gain flavor from the sweet, sour, and butter taste sensations of herbs and spices.  Instead of using salt for seasoning, try the following seasonings with the foods listed.  Remember: how you use them to enhance natural food flavors is limited only by your creativity... Allspice-Meat, fish, eggs, fruit, peas, red and yellow vegetables Almond Extract-Fruit baked goods Anise Seed-Sweet breads, fruit, carrots, beets, cottage cheese, cookies (tastes like licorice) Basil-Meat, fish, eggs, vegetables, rice, vegetables salads, soups, sauces Bay Leaf-Meat, fish, stews, poultry Burnet-Salad, vegetables (cucumber-like flavor) Caraway Seed-Bread, cookies, cottage cheese, meat, vegetables, cheese, rice Cardamon-Baked goods, fruit, soups Celery Powder or seed-Salads, salad dressings, sauces, meatloaf, soup, bread.Do not use  celery  salt Chervil-Meats, salads, fish, eggs, vegetables, cottage cheese (parsley-like flavor) Chili Power-Meatloaf, chicken cheese, corn, eggplant, egg dishes Chives-Salads cottage cheese, egg dishes, soups, vegetables, sauces Cilantro-Salsa, casseroles Cinnamon-Baked goods, fruit, pork, lamb, chicken, carrots Cloves-Fruit, baked goods, fish, pot roast, green beans, beets, carrots Coriander-Pastry, cookies, meat, salads, cheese (lemon-orange flavor) Cumin-Meatloaf, fish,cheese, eggs, cabbage,fruit pie (caraway flavor) [employer], fruit, eggs, fish, poultry, cottage cheese, vegetables Dill Seed-Meat, cottage cheese, poultry, vegetables, fish, salads, bread Fennel Seed-Bread, cookies, apples, pork, eggs, fish, beets,  cabbage, cheese, Licorice-like flavor Garlic-(buds or powder) Salads, meat, poultry, fish, bread, butter, vegetables, potatoes.Do not  use garlic salt Ginger-Fruit, vegetables, baked goods, meat, fish, poultry Horseradish Root-Meet, vegetables, butter Lemon Juice or Extract-Vegetables, fruit, tea, baked goods, fish salads Mace-Baked goods fruit, vegetables, fish, poultry (taste like nutmeg) Maple Extract-Syrups Marjoram-Meat, chicken, fish, vegetables, breads, green salads (taste like Sage) Mint-Tea, lamb, sherbet, vegetables, desserts, carrots, cabbage Mustard, Dry or Seed-Cheese, eggs, meats, vegetables, poultry Nutmeg-Baked goods, fruit, chicken, eggs, vegetables, desserts Onion Powder-Meat, fish, poultry, vegetables, cheese, eggs, bread, rice salads (Do not use   Onion salt) Orange Extract-Desserts, baked goods Oregano-Pasta, eggs, cheese, onions, pork, lamb, fish, chicken, vegetables, green salads Paprika-Meat, fish, poultry, eggs, cheese, vegetables Parsley Flakes-Butter, vegetables, meat fish, poultry, eggs, bread, salads (certain forms may   Contain sodium Pepper-Meat fish, poultry, vegetables, eggs Peppermint Extract-Desserts, baked goods Poppy Seed-Eggs, bread,  cheese, fruit dressings, baked goods, noodles, vegetables, cottage  [employer], poultry, meat, fish, cauliflower, turnips,eggs bread Saffron-Rice, bread, veal, chicken, fish, eggs Sage-Meat, fish, poultry, onions, eggplant, tomateos, pork, stews Savory-Eggs, salads, poultry, meat, rice, vegetables, soups, pork Tarragon-Meat, poultry, fish, eggs, butter, vegetables (licorice-like flavor)  Thyme-Meat, poultry, fish, eggs, vegetables, (clover-like flavor), sauces, soups Tumeric-Salads, butter, eggs, fish, rice, vegetables (saffron-like flavor) Vanilla Extract-Baked goods, candy Vinegar-Salads, vegetables, meat marinades Walnut Extract-baked goods, candy   2. Choose your Foods Wisely   The following is a list of foods to avoid which are high in sodium:  Meats-Avoid all smoked, canned, salt cured, dried and kosher meat and fish as well as Anchovies   [employer] meats:Bologna, Liverwurst, Pastrami Canned meat or fish  Marinated herring Caviar    Pepperoni Corned Beef   Pizza Dried chipped beef  Salami Frozen breaded fish or meat Salt pork Frankfurters or hot dogs  Sardines Gefilte fish   Sausage Ham (boiled ham, Proscuitto Smoked butt    spiced ham)   Spam      TV Dinners Vegetables Canned vegetables (Regular) Relish Canned mushrooms  Sauerkraut Olives    Tomato juice Pickles  Bakery and Dessert Products Canned puddings  Cream pies Cheesecake   Decorated cakes Cookies  Beverages/Juices Tomato juice, regular  Gatorade   V-8 vegetable juice, regular  Breads and Cereals Biscuit mixes   Salted potato chips, corn chips, pretzels Bread stuffing mixes  Salted crackers and rolls Pancake and waffle mixes Self-rising flour  Seasonings Accent    Meat sauces Barbecue sauce  Meat tenderizer Catsup    Monosodium glutamate (MSG) Celery salt   Onion salt Chili sauce   Prepared mustard Garlic salt   Salt, seasoned salt, sea  salt Gravy mixes   Soy sauce Horseradish   Steak sauce Ketchup   Tartar sauce Lite salt    Teriyaki sauce Marinade mixes   Worcestershire sauce  Others Baking powder   Cocoa and cocoa mixes Baking soda   Commercial casserole mixes Candy-caramels, chocolate  Dehydrated soups    Bars, fudge,nougats  Instant rice and pasta mixes Canned broth or soup  Maraschino cherries Cheese, aged and processed cheese and cheese spreads  Learning Assessment Quiz  Indicated T (for True) or F (for False) for each of the following statements:  _____ Fresh fruits and vegetables and unprocessed grains are generally low in sodium _____ Water may contain a considerable amount of sodium, depending on the source _____ You can always tell if a food is high in sodium by tasting it _____ Certain laxatives my be  high in sodium and should be avoided unless prescribed   by a physician or pharmacist _____ Salt substitutes may be used freely by anyone on a sodium restricted diet _____ Sodium is present in table salt, food additives and as a natural component of   most foods _____ Table salt is approximately 90% sodium _____ Limiting sodium intake may help prevent excess fluid accumulation in the body _____ On a sodium-restricted diet, seasonings such as bouillon soy sauce, and    cooking wine should be used in place of table salt _____ On an ingredient list, a product which lists monosodium glutamate as the first   ingredient is an appropriate food to include on a low sodium diet  Circle the best answer(s) to the following statements (Hint: there may be more than one correct answer)  11. On a low-sodium diet, some acceptable snack items are:    A. Olives  F. Bean dip   K. Grapefruit juice    B. Salted Pretzels G. Commercial Popcorn   L. Canned peaches    C. Carrot Sticks  H. Bouillon   M. Unsalted nuts   D. [place] fries  I. Peanut butter crackers N. Salami   E. Sweet pickles J. Tomato Juice   O. Pizza  12.   Seasonings that may be used freely on a reduced - sodium diet include   A. Lemon wedges F.Monosodium glutamate K. Celery seed    B.Soysauce   G. Pepper   L. Mustard powder   C. Sea salt  H. Cooking wine  M. Onion flakes   D. Vinegar  E. Prepared horseradish N. Salsa   E. Sage   J. Worcestershire sauce  O. Chutney   Signed, Jacolyn Reedy, PA-C  07/21/2021 1:06 PM    [facility] [address] [place], [place], [place]  [address] Phone: [phone]; Fax: [phone]

## 2021-07-21 ENCOUNTER — Other Ambulatory Visit: Payer: Self-pay

## 2021-07-21 ENCOUNTER — Ambulatory Visit: Payer: BC Managed Care – PPO | Admitting: Physician Assistant

## 2021-07-21 ENCOUNTER — Encounter: Payer: Self-pay | Admitting: Physician Assistant

## 2021-07-21 VITALS — BP 140/96 | HR 108 | Ht 66.0 in | Wt 227.0 lb

## 2021-07-21 DIAGNOSIS — Z01818 Encounter for other preprocedural examination: Secondary | ICD-10-CM | POA: Diagnosis not present

## 2021-07-21 DIAGNOSIS — I77810 Thoracic aortic ectasia: Secondary | ICD-10-CM

## 2021-07-21 DIAGNOSIS — I2510 Atherosclerotic heart disease of native coronary artery without angina pectoris: Secondary | ICD-10-CM

## 2021-07-21 DIAGNOSIS — I10 Essential (primary) hypertension: Secondary | ICD-10-CM

## 2021-07-21 DIAGNOSIS — E7849 Other hyperlipidemia: Secondary | ICD-10-CM

## 2021-07-21 DIAGNOSIS — E118 Type 2 diabetes mellitus with unspecified complications: Secondary | ICD-10-CM

## 2021-07-21 MED ORDER — METOPROLOL SUCCINATE ER 25 MG PO TB24: 25.0000 mg | ORAL_TABLET | Freq: Every day | ORAL | 3 refills | Status: DC

## 2021-07-21 NOTE — Patient Instructions (Addendum)
Medication Instructions:  1.Start metoprolol succinate (Toprol XL) 25 mg, take one tablet by mouth daily *If you need a refill on your cardiac medications before your next appointment, please call your pharmacy*   Lab Work: CMET, CBC, fasting lipid, and TSH, can be done on same day as ECHO If you have labs (blood work) drawn today and your tests are completely normal, you will receive your results only by: MyChart Message (if you have MyChart) OR A paper copy in the mail If you have any lab test that is abnormal or we need to change your treatment, we will call you to review the results.   Testing/Procedures: Your physician has requested that you have an echocardiogram next available, this week if possible. Echocardiography is a painless test that uses sound waves to create images of your heart. It provides your doctor with information about the size and shape of your heart and how well your heart's chambers and valves are working. This procedure takes approximately one hour. There are no restrictions for this procedure.   Non-Cardiac CT Angiography (CTA), is a special type of CT scan that uses a computer to produce multi-dimensional views of major blood vessels throughout the body. In CT angiography, a contrast material is injected through an IV to help visualize the blood vessels    Follow-Up: At [facility], you and your health needs are our priority.  As part of our continuing mission to provide you with exceptional heart care, we have created designated Provider Care Teams.  These Care Teams include your primary Cardiologist (physician) and Advanced Practice Providers (APPs -  Physician Assistants and Nurse Practitioners) who all work together to provide you with the care you need, when you need it.   Your next appointment:   3-4 month(s)  The format for your next appointment:   In Person  Provider:   Thurmon Fair, MD {   Other Instructions Try to decrease your caffeine  intake   Two Gram Sodium Diet 2000 mg  What is Sodium? Sodium is a mineral found naturally in many foods. The most significant source of sodium in the diet is table salt, which is about 40% sodium.  Processed, convenience, and preserved foods also contain a large amount of sodium.  The body needs only 500 mg of sodium daily to function,  A normal diet provides more than enough sodium even if you do not use salt.  Why Limit Sodium? A build up of sodium in the body can cause thirst, increased blood pressure, shortness of breath, and water retention.  Decreasing sodium in the diet can reduce edema and risk of heart attack or stroke associated with high blood pressure.  Keep in mind that there are many other factors involved in these health problems.  Heredity, obesity, lack of exercise, cigarette smoking, stress and what you eat all play a role.  General Guidelines: Do not add salt at the table or in cooking.  One teaspoon of salt contains over 2 grams of sodium. Read food labels Avoid processed and convenience foods Ask your dietitian before eating any foods not dicussed in the menu planning guidelines Consult your physician if you wish to use a salt substitute or a sodium containing medication such as antacids.  Limit milk and milk products to 16 oz (2 cups) per day.  Shopping Hints: READ LABELS!! "Dietetic" does not necessarily mean low sodium. Salt and other sodium ingredients are often added to foods during processing.    Menu Planning Guidelines  Food Group Choose More Often Avoid  Beverages (see also the milk group All fruit juices, low-sodium, salt-free vegetables juices, low-sodium carbonated beverages Regular vegetable or tomato juices, commercially softened water used for drinking or cooking  Breads and Cereals Enriched white, wheat, rye and pumpernickel bread, hard rolls and dinner rolls; muffins, cornbread and waffles; most dry cereals, cooked cereal without added salt; unsalted  crackers and breadsticks; low sodium or homemade bread crumbs Bread, rolls and crackers with salted tops; quick breads; instant hot cereals; pancakes; commercial bread stuffing; self-rising flower and biscuit mixes; regular bread crumbs or cracker crumbs  Desserts and Sweets Desserts and sweets mad with mild should be within allowance Instant pudding mixes and cake mixes  Fats Butter or margarine; vegetable oils; unsalted salad dressings, regular salad dressings limited to 1 Tbs; light, sour and heavy cream Regular salad dressings containing bacon fat, bacon bits, and salt pork; snack dips made with instant soup mixes or processed cheese; salted nuts  Fruits Most fresh, frozen and canned fruits Fruits processed with salt or sodium-containing ingredient (some dried fruits are processed with sodium sulfites        Vegetables Fresh, frozen vegetables and low- sodium canned vegetables Regular canned vegetables, sauerkraut, pickled vegetables, and others prepared in brine; frozen vegetables in sauces; vegetables seasoned with ham, bacon or salt pork  Condiments, Sauces, Miscellaneous  Salt substitute with physician's approval; pepper, herbs, spices; vinegar, lemon or lime juice; hot pepper sauce; garlic powder, onion powder, low sodium soy sauce (1 Tbs.); low sodium condiments (ketchup, chili sauce, mustard) in limited amounts (1 tsp.) fresh ground horseradish; unsalted tortilla chips, pretzels, potato chips, popcorn, salsa (1/4 cup) Any seasoning made with salt including garlic salt, celery salt, onion salt, and seasoned salt; sea salt, rock salt, kosher salt; meat tenderizers; monosodium glutamate; mustard, regular soy sauce, barbecue, sauce, chili sauce, teriyaki sauce, steak sauce, Worcestershire sauce, and most flavored vinegars; canned gravy and mixes; regular condiments; salted snack foods, olives, picles, relish, horseradish sauce, catsup   Food preparation: Try these seasonings Meats:    Pork  Sage, onion Serve with applesauce  Chicken Poultry seasoning, thyme, parsley Serve with cranberry sauce  Lamb Curry powder, rosemary, garlic, thyme Serve with mint sauce or jelly  Veal Marjoram, basil Serve with current jelly, cranberry sauce  Beef Pepper, bay leaf Serve with dry mustard, unsalted chive butter  Fish [place] leaf, dill Serve with unsalted lemon butter, unsalted parsley butter  Vegetables:    Asparagus Lemon juice   Broccoli Lemon juice   Carrots Mustard dressing parsley, mint, nutmeg, glazed with unsalted butter and sugar   Green beans Marjoram, lemon juice, nutmeg,dill seed   Tomatoes Basil, marjoram, onion   Spice /blend for [employer]" 4 tsp ground thyme 1 tsp ground sage 3 tsp ground rosemary 4 tsp ground marjoram   Test your knowledge A product that says "Salt Free" may still contain sodium. True or False Garlic Powder and Hot Pepper Sauce an be used as alternative seasonings.True or False Processed foods have more sodium than fresh foods.  True or False Canned Vegetables have less sodium than froze True or False   WAYS TO DECREASE YOUR SODIUM INTAKE Avoid the use of added salt in cooking and at the table.  Table salt (and other prepared seasonings which contain salt) is probably one of the greatest sources of sodium in the diet.  Unsalted foods can gain flavor from the sweet, sour, and butter taste sensations of herbs and spices.  Instead  of using salt for seasoning, try the following seasonings with the foods listed.  Remember: how you use them to enhance natural food flavors is limited only by your creativity... Allspice-Meat, fish, eggs, fruit, peas, red and yellow vegetables Almond Extract-Fruit baked goods Anise Seed-Sweet breads, fruit, carrots, beets, cottage cheese, cookies (tastes like licorice) Basil-Meat, fish, eggs, vegetables, rice, vegetables salads, soups, sauces Bay Leaf-Meat, fish, stews, poultry Burnet-Salad, vegetables (cucumber-like  flavor) Caraway Seed-Bread, cookies, cottage cheese, meat, vegetables, cheese, rice Cardamon-Baked goods, fruit, soups Celery Powder or seed-Salads, salad dressings, sauces, meatloaf, soup, bread.Do not use  celery salt Chervil-Meats, salads, fish, eggs, vegetables, cottage cheese (parsley-like flavor) Chili Power-Meatloaf, chicken cheese, corn, eggplant, egg dishes Chives-Salads cottage cheese, egg dishes, soups, vegetables, sauces Cilantro-Salsa, casseroles Cinnamon-Baked goods, fruit, pork, lamb, chicken, carrots Cloves-Fruit, baked goods, fish, pot roast, green beans, beets, carrots Coriander-Pastry, cookies, meat, salads, cheese (lemon-orange flavor) Cumin-Meatloaf, fish,cheese, eggs, cabbage,fruit pie (caraway flavor) [employer], fruit, eggs, fish, poultry, cottage cheese, vegetables Dill Seed-Meat, cottage cheese, poultry, vegetables, fish, salads, bread Fennel Seed-Bread, cookies, apples, pork, eggs, fish, beets, cabbage, cheese, Licorice-like flavor Garlic-(buds or powder) Salads, meat, poultry, fish, bread, butter, vegetables, potatoes.Do not  use garlic salt Ginger-Fruit, vegetables, baked goods, meat, fish, poultry Horseradish Root-Meet, vegetables, butter Lemon Juice or Extract-Vegetables, fruit, tea, baked goods, fish salads Mace-Baked goods fruit, vegetables, fish, poultry (taste like nutmeg) Maple Extract-Syrups Marjoram-Meat, chicken, fish, vegetables, breads, green salads (taste like Sage) Mint-Tea, lamb, sherbet, vegetables, desserts, carrots, cabbage Mustard, Dry or Seed-Cheese, eggs, meats, vegetables, poultry Nutmeg-Baked goods, fruit, chicken, eggs, vegetables, desserts Onion Powder-Meat, fish, poultry, vegetables, cheese, eggs, bread, rice salads (Do not use   Onion salt) Orange Extract-Desserts, baked goods Oregano-Pasta, eggs, cheese, onions, pork, lamb, fish, chicken, vegetables, green salads Paprika-Meat, fish, poultry, eggs, cheese, vegetables Parsley  Flakes-Butter, vegetables, meat fish, poultry, eggs, bread, salads (certain forms may   Contain sodium Pepper-Meat fish, poultry, vegetables, eggs Peppermint Extract-Desserts, baked goods Poppy Seed-Eggs, bread, cheese, fruit dressings, baked goods, noodles, vegetables, cottage  [employer], poultry, meat, fish, cauliflower, turnips,eggs bread Saffron-Rice, bread, veal, chicken, fish, eggs Sage-Meat, fish, poultry, onions, eggplant, tomateos, pork, stews Savory-Eggs, salads, poultry, meat, rice, vegetables, soups, pork Tarragon-Meat, poultry, fish, eggs, butter, vegetables (licorice-like flavor)  Thyme-Meat, poultry, fish, eggs, vegetables, (clover-like flavor), sauces, soups Tumeric-Salads, butter, eggs, fish, rice, vegetables (saffron-like flavor) Vanilla Extract-Baked goods, candy Vinegar-Salads, vegetables, meat marinades Walnut Extract-baked goods, candy   2. Choose your Foods Wisely   The following is a list of foods to avoid which are high in sodium:  Meats-Avoid all smoked, canned, salt cured, dried and kosher meat and fish as well as Anchovies   [employer] meats:Bologna, Liverwurst, Pastrami Canned meat or fish  Marinated herring Caviar    Pepperoni Corned Beef   Pizza Dried chipped beef  Salami Frozen breaded fish or meat Salt pork Frankfurters or hot dogs  Sardines Gefilte fish   Sausage Ham (boiled ham, Proscuitto Smoked butt    spiced ham)   Spam      TV Dinners Vegetables Canned vegetables (Regular) Relish Canned mushrooms  Sauerkraut Olives    Tomato juice Pickles  Bakery and Dessert Products Canned puddings  Cream pies Cheesecake   Decorated cakes Cookies  Beverages/Juices Tomato juice, regular  Gatorade   V-8 vegetable juice, regular  Breads and Cereals Biscuit mixes   Salted potato chips, corn chips, pretzels Bread stuffing mixes  Salted crackers and rolls Pancake and waffle mixes Self-rising  flour  Seasonings Accent    Meat sauces Barbecue sauce  Meat tenderizer Catsup    Monosodium glutamate (MSG) Celery salt   Onion salt Chili sauce   Prepared mustard Garlic salt   Salt, seasoned salt, sea salt Gravy mixes   Soy sauce Horseradish   Steak sauce Ketchup   Tartar sauce Lite salt    Teriyaki sauce Marinade mixes   Worcestershire sauce  Others Baking powder   Cocoa and cocoa mixes Baking soda   Commercial casserole mixes Candy-caramels, chocolate  Dehydrated soups    Bars, fudge,nougats  Instant rice and pasta mixes Canned broth or soup  Maraschino cherries Cheese, aged and processed cheese and cheese spreads  Learning Assessment Quiz  Indicated T (for True) or F (for False) for each of the following statements:  _____ Fresh fruits and vegetables and unprocessed grains are generally low in sodium _____ Water may contain a considerable amount of sodium, depending on the source _____ You can always tell if a food is high in sodium by tasting it _____ Certain laxatives my be high in sodium and should be avoided unless prescribed   by a physician or pharmacist _____ Salt substitutes may be used freely by anyone on a sodium restricted diet _____ Sodium is present in table salt, food additives and as a natural component of   most foods _____ Table salt is approximately 90% sodium _____ Limiting sodium intake may help prevent excess fluid accumulation in the body _____ On a sodium-restricted diet, seasonings such as bouillon soy sauce, and    cooking wine should be used in place of table salt _____ On an ingredient list, a product which lists monosodium glutamate as the first   ingredient is an appropriate food to include on a low sodium diet  Circle the best answer(s) to the following statements (Hint: there may be more than one correct answer)  11. On a low-sodium diet, some acceptable snack items are:    A. Olives  F. Bean dip   K. Grapefruit juice    B. Salted  Pretzels G. Commercial Popcorn   L. Canned peaches    C. Carrot Sticks  H. Bouillon   M. Unsalted nuts   D. [place] fries  I. Peanut butter crackers N. Salami   E. Sweet pickles J. Tomato Juice   O. Pizza  12.  Seasonings that may be used freely on a reduced - sodium diet include   A. Lemon wedges F.Monosodium glutamate K. Celery seed    B.Soysauce   G. Pepper   L. Mustard powder   C. Sea salt  H. Cooking wine  M. Onion flakes   D. Vinegar  E. Prepared horseradish N. Salsa   E. Sage   J. Worcestershire sauce  O. Chutney

## 2021-07-22 ENCOUNTER — Ambulatory Visit (INDEPENDENT_AMBULATORY_CARE_PROVIDER_SITE_OTHER): Payer: BC Managed Care – PPO

## 2021-07-22 DIAGNOSIS — I77810 Thoracic aortic ectasia: Secondary | ICD-10-CM | POA: Diagnosis not present

## 2021-07-22 DIAGNOSIS — Z01810 Encounter for preprocedural cardiovascular examination: Secondary | ICD-10-CM

## 2021-07-22 DIAGNOSIS — I2510 Atherosclerotic heart disease of native coronary artery without angina pectoris: Secondary | ICD-10-CM | POA: Diagnosis not present

## 2021-07-22 DIAGNOSIS — Z01818 Encounter for other preprocedural examination: Secondary | ICD-10-CM

## 2021-07-22 LAB — ECHOCARDIOGRAM COMPLETE
AR max vel: 3.45 cm2
AV Area VTI: 3.73 cm2
AV Area mean vel: 3.34 cm2
AV Mean grad: 4 mmHg
AV Peak grad: 7.3 mmHg
Ao pk vel: 1.35 m/s
Area-P 1/2: 5.93 cm2
S' Lateral: 3.2 cm

## 2021-07-22 MED ORDER — PERFLUTREN LIPID MICROSPHERE
1.0000 mL | INTRAVENOUS | Status: AC | PRN
  Administered 2021-07-22: 2 mL via INTRAVENOUS

## 2021-07-26 ENCOUNTER — Other Ambulatory Visit: Payer: Self-pay

## 2021-07-26 ENCOUNTER — Encounter (HOSPITAL_COMMUNITY): Payer: Self-pay | Admitting: Neurosurgery

## 2021-07-26 NOTE — Progress Notes (Signed)
Spoke with pt for pre-op call. Pt has hx of CAD with stents in 2004. Sees Dr. Royann Shivers and Herma Carson, PA. Cardiac clearance in Epic noted on Echo report.  Pt is a type 2 Diabetic. Last A1C was 6.1 in 2021. Instructed pt to hold his Januvia and Metformin the morning of surgery. Instructed him to check his blood sugar when he wakes up Wednesday AM and every 2 hours until he leaves for the hospital. If blood sugar is 70 or below, treat with 1/2 cup of clear juice (apple or cranberry) and recheck blood sugar 15 minutes after drinking juice. If blood sugar continues to be 70 or below, call the Short Stay department and ask to speak to a nurse. Pt voiced understanding.   Covid test will need to be done day of surgery.  Sent to Anesthesia PA to review recent EKG.

## 2021-07-27 ENCOUNTER — Other Ambulatory Visit: Payer: BC Managed Care – PPO | Admitting: *Deleted

## 2021-07-27 DIAGNOSIS — Z01818 Encounter for other preprocedural examination: Secondary | ICD-10-CM

## 2021-07-27 DIAGNOSIS — E7849 Other hyperlipidemia: Secondary | ICD-10-CM

## 2021-07-27 DIAGNOSIS — E118 Type 2 diabetes mellitus with unspecified complications: Secondary | ICD-10-CM

## 2021-07-27 DIAGNOSIS — I77810 Thoracic aortic ectasia: Secondary | ICD-10-CM

## 2021-07-27 DIAGNOSIS — I2510 Atherosclerotic heart disease of native coronary artery without angina pectoris: Secondary | ICD-10-CM

## 2021-07-27 DIAGNOSIS — I10 Essential (primary) hypertension: Secondary | ICD-10-CM

## 2021-07-27 LAB — COMPREHENSIVE METABOLIC PANEL
ALT: 24 IU/L (ref 0–44)
AST: 30 IU/L (ref 0–40)
Albumin/Globulin Ratio: 1.7 (ref 1.2–2.2)
Albumin: 4.7 g/dL (ref 3.8–4.9)
Alkaline Phosphatase: 56 IU/L (ref 44–121)
BUN/Creatinine Ratio: 15 (ref 9–20)
BUN: 21 mg/dL (ref 6–24)
Bilirubin Total: 0.4 mg/dL (ref 0.0–1.2)
CO2: 24 mmol/L (ref 20–29)
Calcium: 9.3 mg/dL (ref 8.7–10.2)
Chloride: 100 mmol/L (ref 96–106)
Creatinine, Ser: 1.39 mg/dL — ABNORMAL HIGH (ref 0.76–1.27)
Globulin, Total: 2.7 g/dL (ref 1.5–4.5)
Glucose: 141 mg/dL — ABNORMAL HIGH (ref 70–99)
Potassium: 4.7 mmol/L (ref 3.5–5.2)
Sodium: 137 mmol/L (ref 134–144)
Total Protein: 7.4 g/dL (ref 6.0–8.5)
eGFR: 59 mL/min/{1.73_m2} — ABNORMAL LOW (ref >59)

## 2021-07-27 LAB — LIPID PANEL
Chol/HDL Ratio: 3.1 ratio (ref 0.0–5.0)
Cholesterol, Total: 135 mg/dL (ref 100–199)
HDL: 43 mg/dL (ref >39)
LDL Chol Calc (NIH): 70 mg/dL (ref 0–99)
Triglycerides: 122 mg/dL (ref 0–149)
VLDL Cholesterol Cal: 22 mg/dL (ref 5–40)

## 2021-07-27 LAB — CBC
Hematocrit: 40 % (ref 37.5–51.0)
Hemoglobin: 13.6 g/dL (ref 13.0–17.7)
MCH: 32.9 pg (ref 26.6–33.0)
MCHC: 34 g/dL (ref 31.5–35.7)
MCV: 97 fL (ref 79–97)
Platelets: 224 10*3/uL (ref 150–450)
RBC: 4.14 x10E6/uL (ref 4.14–5.80)
RDW: 11.9 % (ref 11.6–15.4)
WBC: 6.3 10*3/uL (ref 3.4–10.8)

## 2021-07-27 LAB — TSH: TSH: 1.26 u[IU]/mL (ref 0.450–4.500)

## 2021-07-27 NOTE — Anesthesia Preprocedure Evaluation (Addendum)
Anesthesia Evaluation  Patient identified by MRN, date of birth, ID band Patient awake    Reviewed: Allergy & Precautions, H&P , NPO status , Patient's Chart, lab work & pertinent test results, reviewed documented beta blocker date and time   Airway Mallampati: III  TM Distance: >3 FB Neck ROM: Full    Dental no notable dental hx. (+) Teeth Intact, Dental Advisory Given   Pulmonary neg pulmonary ROS, former smoker,    Pulmonary exam normal breath sounds clear to auscultation       Cardiovascular hypertension, Pt. on medications and Pt. on home beta blockers + CAD and + Cardiac Stents   Rhythm:Regular Rate:Normal     Neuro/Psych Anxiety Depression negative neurological ROS     GI/Hepatic Neg liver ROS, PUD, GERD  Medicated,  Endo/Other  diabetes, Type 2, Oral Hypoglycemic Agents  Renal/GU negative Renal ROS  negative genitourinary   Musculoskeletal  (+) Arthritis , Osteoarthritis,    Abdominal   Peds  Hematology negative hematology ROS (+)   Anesthesia Other Findings   Reproductive/Obstetrics negative OB ROS                           Anesthesia Physical Anesthesia Plan  ASA: 3  Anesthesia Plan: General   Post-op Pain Management: Tylenol PO (pre-op)   Induction: Intravenous  PONV Risk Score and Plan: 3 and Ondansetron, Dexamethasone and Midazolam  Airway Management Planned: Oral ETT  Additional Equipment:   Intra-op Plan:   Post-operative Plan: Extubation in OR  Informed Consent: I have reviewed the patients History and Physical, chart, labs and discussed the procedure including the risks, benefits and alternatives for the proposed anesthesia with the patient or authorized representative who has indicated his/her understanding and acceptance.     Dental advisory given  Plan Discussed with: CRNA  Anesthesia Plan Comments: (PAT note by Antionette Poles, PA-C: Follows with  cardiology for history of HTN, HLD, CAD with remote stent placement to LAD in 2004.  Repeat catheterization in 2011 showed widely patent stent.  Last seen 07/21/2021 for preop evaluation.  At that time he was also noted to have elevated resting heart rate and metoprolol was added.  Per note, "Preop clearance for cervical fusion 07/28/2021 by Dr. Donalee Citrin. Patient has elevated BP and HR today-has been running high for awhile. Decrease caffeine intake and update echo. If ok can proceed with surgery. METS>6. Add Toprol XL 25 mg once daily. Check labs. According to the Revised Cardiac Risk Index (RCRI),hisPerioperative Risk of Major Cardiac Event is (%): 0.9. HisFunctional Capacity in METs is: 6.61according to the Duke Activity Status Index (DASI)."  Echo 07/22/2021 showed normal LVEF, normal wall motion, grade 1 DD, mildly dilated aorta.  History of DM2, maintained on metformin and Sitagliptin.  Patient had blood work drawn at cardiology office 07/27/2021.  Results pending.  Patient will need day of surgery evaluation.  EKG 07/21/2021: Sinus tachycardia.  Rate 108.  Left anterior fascicular block.  Poor R wave progression.  TTE 07/22/2021: 1. Left ventricular ejection fraction, by estimation, is 55%. The left  ventricle has normal function. The left ventricle has no regional wall  motion abnormalities. Left ventricular diastolic parameters are consistent  with Grade I diastolic dysfunction  (impaired relaxation).  2. Right ventricular systolic function is normal. The right ventricular  size is normal.  3. The mitral valve is normal in structure. No evidence of mitral valve  regurgitation.  4. The aortic valve is  grossly normal. Aortic valve regurgitation is not  visualized.  5. Aortic dilatation noted. There is mild dilatation of the ascending  aorta, measuring 40 mm.  6. The inferior vena cava is normal in size with greater than 50%  respiratory variability, suggesting right atrial  pressure of 3 mmHg.   Comparison(s): LVEF 55%, Aorta 4.3 cm.   According to 11/28/09 discharge summary,cardiac catheterization on 11/27/2009 showed normal left main, LAD stent widely patent, left circumflex was a nondominant vessel without visible obstruction, RCA large dominant vessel that generates a large PDA and bifurcating posterior lateral artery without meaningful obstruction, LV size was normal with ejection fraction of 60-65%. )       Anesthesia Quick Evaluation

## 2021-07-27 NOTE — Progress Notes (Signed)
Anesthesia Chart Review: Same day workup  Follows with cardiology for history of HTN, HLD, CAD with remote stent placement to LAD in 2004.  Repeat catheterization in 2011 showed widely patent stent.  Last seen 07/21/2021 for preop evaluation.  At that time he was also noted to have elevated resting heart rate and metoprolol was added.  Per note, "Preop clearance for cervical fusion 07/28/2021 by Dr. Donalee Citrin. Patient has elevated BP and HR today-has been running high for awhile. Decrease caffeine intake and update echo. If ok can proceed with surgery. METS>6. Add Toprol XL 25 mg once daily. Check labs. According to the Revised Cardiac Risk Index (RCRI), his Perioperative Risk of Major Cardiac Event is (%): 0.9. His Functional Capacity in METs is: 6.61 according to the Duke Activity Status Index (DASI)."  Echo 07/22/2021 showed normal LVEF, normal wall motion, grade 1 DD, mildly dilated aorta.   History of DM2, maintained on metformin and Sitagliptin.  Patient had blood work drawn at cardiology office 07/27/2021.  Results pending.  Patient will need day of surgery evaluation.  EKG 07/21/2021: Sinus tachycardia.  Rate 108.  Left anterior fascicular block.  Poor R wave progression.  TTE 07/22/2021:  1. Left ventricular ejection fraction, by estimation, is 55%. The left  ventricle has normal function. The left ventricle has no regional wall  motion abnormalities. Left ventricular diastolic parameters are consistent  with Grade I diastolic dysfunction  (impaired relaxation).   2. Right ventricular systolic function is normal. The right ventricular  size is normal.   3. The mitral valve is normal in structure. No evidence of mitral valve  regurgitation.   4. The aortic valve is grossly normal. Aortic valve regurgitation is not  visualized.   5. Aortic dilatation noted. There is mild dilatation of the ascending  aorta, measuring 40 mm.   6. The inferior vena cava is normal in size with greater  than 50%  respiratory variability, suggesting right atrial pressure of 3 mmHg.   Comparison(s): LVEF 55%, Aorta 4.3 cm.   According to 11/28/09 discharge summary, cardiac catheterization on 11/27/2009 showed normal left main, LAD stent widely patent, left circumflex was a nondominant vessel without visible obstruction, RCA large dominant vessel that generates a large PDA and bifurcating posterior lateral artery without meaningful obstruction, LV size was normal with ejection fraction of 60-65%.    Zannie C[facility] Short Stay Center/Anesthesiology Phone [phone] 07/27/2021 9:46 AM

## 2021-07-28 ENCOUNTER — Ambulatory Visit (HOSPITAL_COMMUNITY): Payer: BC Managed Care – PPO | Admitting: Physician Assistant

## 2021-07-28 ENCOUNTER — Encounter (HOSPITAL_COMMUNITY)
Admission: RE | Disposition: A | Discharge status: Home / Self Care | Payer: Self-pay | Source: Home / Self Care | Attending: Neurosurgery

## 2021-07-28 ENCOUNTER — Ambulatory Visit (HOSPITAL_COMMUNITY)
Admission: RE | Admit: 2021-07-28 | Discharge: 2021-07-28 | Disposition: A | Discharge status: Home / Self Care | Payer: BC Managed Care – PPO | Attending: Neurosurgery | Admitting: Neurosurgery

## 2021-07-28 ENCOUNTER — Ambulatory Visit (HOSPITAL_COMMUNITY): Payer: BC Managed Care – PPO

## 2021-07-28 ENCOUNTER — Encounter (HOSPITAL_COMMUNITY): Payer: Self-pay | Admitting: Neurosurgery

## 2021-07-28 DIAGNOSIS — Z20822 Contact with and (suspected) exposure to covid-19: Secondary | ICD-10-CM | POA: Insufficient documentation

## 2021-07-28 DIAGNOSIS — M4712 Other spondylosis with myelopathy, cervical region: Secondary | ICD-10-CM | POA: Diagnosis not present

## 2021-07-28 DIAGNOSIS — G959 Disease of spinal cord, unspecified: Secondary | ICD-10-CM | POA: Diagnosis present

## 2021-07-28 DIAGNOSIS — Z87891 Personal history of nicotine dependence: Secondary | ICD-10-CM | POA: Insufficient documentation

## 2021-07-28 DIAGNOSIS — M4802 Spinal stenosis, cervical region: Secondary | ICD-10-CM | POA: Diagnosis not present

## 2021-07-28 DIAGNOSIS — M79603 Pain in arm, unspecified: Secondary | ICD-10-CM | POA: Diagnosis not present

## 2021-07-28 DIAGNOSIS — Z419 Encounter for procedure for purposes other than remedying health state, unspecified: Secondary | ICD-10-CM

## 2021-07-28 HISTORY — PX: ANTERIOR CERVICAL DECOMP/DISCECTOMY FUSION: SHX1161

## 2021-07-28 LAB — SURGICAL PCR SCREEN
MRSA, PCR: NEGATIVE
Staphylococcus aureus: POSITIVE — AB

## 2021-07-28 LAB — TYPE AND SCREEN
ABO/RH(D): A POS
Antibody Screen: NEGATIVE

## 2021-07-28 LAB — SARS CORONAVIRUS 2 BY RT PCR (HOSPITAL ORDER, PERFORMED IN ~~LOC~~ HOSPITAL LAB): SARS Coronavirus 2: NEGATIVE

## 2021-07-28 LAB — GLUCOSE, CAPILLARY
Glucose-Capillary: 133 mg/dL — ABNORMAL HIGH (ref 70–99)
Glucose-Capillary: 177 mg/dL — ABNORMAL HIGH (ref 70–99)

## 2021-07-28 SURGERY — ANTERIOR CERVICAL DECOMPRESSION/DISCECTOMY FUSION 2 LEVELS
Anesthesia: General

## 2021-07-28 MED ORDER — FAMOTIDINE 20 MG PO TABS: 20.0000 mg | ORAL_TABLET | Freq: Every day | ORAL | Status: DC

## 2021-07-28 MED ORDER — HYDROMORPHONE HCL 1 MG/ML IJ SOLN: 0.2500 mg | INTRAMUSCULAR | Status: DC | PRN

## 2021-07-28 MED ORDER — HYDROCODONE-ACETAMINOPHEN 5-325 MG PO TABS: 1.0000 | ORAL_TABLET | ORAL | 0 refills | Status: AC | PRN

## 2021-07-28 MED ORDER — SIMVASTATIN 20 MG PO TABS: 20.0000 mg | ORAL_TABLET | Freq: Every day | ORAL | Status: DC

## 2021-07-28 MED ORDER — MIDAZOLAM HCL 2 MG/2ML IJ SOLN
INTRAMUSCULAR | Status: AC
  Filled 2021-07-28: qty 2

## 2021-07-28 MED ORDER — DEXAMETHASONE SODIUM PHOSPHATE 10 MG/ML IJ SOLN
INTRAMUSCULAR | Status: AC
  Filled 2021-07-28: qty 1

## 2021-07-28 MED ORDER — METOPROLOL SUCCINATE ER 25 MG PO TB24: 25.0000 mg | ORAL_TABLET | Freq: Every day | ORAL | Status: DC

## 2021-07-28 MED ORDER — COLCHICINE 0.6 MG PO TABS
0.6000 mg | ORAL_TABLET | Freq: Every day | ORAL | Status: DC | PRN
  Filled 2021-07-28: qty 1

## 2021-07-28 MED ORDER — CHLORHEXIDINE GLUCONATE CLOTH 2 % EX PADS: 6.0000 | MEDICATED_PAD | Freq: Once | CUTANEOUS | Status: DC

## 2021-07-28 MED ORDER — METFORMIN HCL 500 MG PO TABS: 1000.0000 mg | ORAL_TABLET | Freq: Two times a day (BID) | ORAL | Status: DC

## 2021-07-28 MED ORDER — SODIUM CHLORIDE 0.9 % IV SOLN: INTRAVENOUS | Status: DC | PRN

## 2021-07-28 MED ORDER — THROMBIN 5000 UNITS EX SOLR
CUTANEOUS | Status: DC | PRN
  Administered 2021-07-28 (×2): 5000 [IU] via TOPICAL

## 2021-07-28 MED ORDER — SUGAMMADEX SODIUM 200 MG/2ML IV SOLN
INTRAVENOUS | Status: DC | PRN
  Administered 2021-07-28: 199.6 mg via INTRAVENOUS

## 2021-07-28 MED ORDER — CYCLOBENZAPRINE HCL 10 MG PO TABS: 10.0000 mg | ORAL_TABLET | Freq: Three times a day (TID) | ORAL | Status: DC | PRN

## 2021-07-28 MED ORDER — HYDROMORPHONE HCL 1 MG/ML IJ SOLN: 0.5000 mg | INTRAMUSCULAR | Status: DC | PRN

## 2021-07-28 MED ORDER — HEMOSTATIC AGENTS (NO CHARGE) OPTIME
TOPICAL | Status: DC | PRN
  Administered 2021-07-28: 1 via TOPICAL

## 2021-07-28 MED ORDER — PHENYLEPHRINE HCL-NACL 20-0.9 MG/250ML-% IV SOLN
INTRAVENOUS | Status: DC | PRN
  Administered 2021-07-28: 25 ug/min via INTRAVENOUS

## 2021-07-28 MED ORDER — CEFAZOLIN SODIUM-DEXTROSE 2-4 GM/100ML-% IV SOLN
2.0000 g | Freq: Three times a day (TID) | INTRAVENOUS | Status: DC
  Administered 2021-07-28: 2 g via INTRAVENOUS
  Filled 2021-07-28: qty 100

## 2021-07-28 MED ORDER — ALLOPURINOL 100 MG PO TABS: 100.0000 mg | ORAL_TABLET | Freq: Every day | ORAL | Status: DC

## 2021-07-28 MED ORDER — ONDANSETRON HCL 4 MG/2ML IJ SOLN
INTRAMUSCULAR | Status: AC
  Filled 2021-07-28: qty 2

## 2021-07-28 MED ORDER — LINAGLIPTIN 5 MG PO TABS
5.0000 mg | ORAL_TABLET | Freq: Every day | ORAL | Status: DC
  Filled 2021-07-28: qty 1

## 2021-07-28 MED ORDER — DEXAMETHASONE SODIUM PHOSPHATE 10 MG/ML IJ SOLN
INTRAMUSCULAR | Status: DC | PRN
  Administered 2021-07-28: 5 mg via INTRAVENOUS

## 2021-07-28 MED ORDER — PROPOFOL 10 MG/ML IV BOLUS
INTRAVENOUS | Status: AC
  Filled 2021-07-28: qty 20

## 2021-07-28 MED ORDER — OXYCODONE HCL 5 MG PO TABS
10.0000 mg | ORAL_TABLET | ORAL | Status: DC | PRN
  Administered 2021-07-28: 10 mg via ORAL
  Filled 2021-07-28: qty 2

## 2021-07-28 MED ORDER — ALUM & MAG HYDROXIDE-SIMETH 200-200-20 MG/5ML PO SUSP: 30.0000 mL | Freq: Four times a day (QID) | ORAL | Status: DC | PRN

## 2021-07-28 MED ORDER — FENTANYL CITRATE (PF) 250 MCG/5ML IJ SOLN
INTRAMUSCULAR | Status: AC
  Filled 2021-07-28: qty 5

## 2021-07-28 MED ORDER — 0.9 % SODIUM CHLORIDE (POUR BTL) OPTIME
TOPICAL | Status: DC | PRN
  Administered 2021-07-28 (×2): 1000 mL

## 2021-07-28 MED ORDER — PHENOL 1.4 % MT LIQD: 1.0000 | OROMUCOSAL | Status: DC | PRN

## 2021-07-28 MED ORDER — ROCURONIUM BROMIDE 10 MG/ML (PF) SYRINGE
PREFILLED_SYRINGE | INTRAVENOUS | Status: DC | PRN
  Administered 2021-07-28: 20 mg via INTRAVENOUS
  Administered 2021-07-28: 70 mg via INTRAVENOUS
  Administered 2021-07-28: 30 mg via INTRAVENOUS

## 2021-07-28 MED ORDER — LACTATED RINGERS IV SOLN: INTRAVENOUS | Status: DC

## 2021-07-28 MED ORDER — THROMBIN 5000 UNITS EX SOLR
OROMUCOSAL | Status: DC | PRN
  Administered 2021-07-28: 5 mL via TOPICAL

## 2021-07-28 MED ORDER — THROMBIN 5000 UNITS EX SOLR
CUTANEOUS | Status: AC
  Filled 2021-07-28: qty 15000

## 2021-07-28 MED ORDER — SODIUM CHLORIDE 0.9% FLUSH: 3.0000 mL | Freq: Two times a day (BID) | INTRAVENOUS | Status: DC

## 2021-07-28 MED ORDER — FLUTICASONE PROPIONATE 50 MCG/ACT NA SUSP
2.0000 | Freq: Every day | NASAL | Status: DC
  Filled 2021-07-28: qty 16

## 2021-07-28 MED ORDER — LISINOPRIL-HYDROCHLOROTHIAZIDE 20-12.5 MG PO TABS: 1.0000 | ORAL_TABLET | Freq: Every day | ORAL | Status: DC

## 2021-07-28 MED ORDER — ACETAMINOPHEN 325 MG PO TABS: 650.0000 mg | ORAL_TABLET | ORAL | Status: DC | PRN

## 2021-07-28 MED ORDER — ASPIRIN EC 81 MG PO TBEC: 81.0000 mg | DELAYED_RELEASE_TABLET | Freq: Every day | ORAL | Status: DC

## 2021-07-28 MED ORDER — ASPIRIN EC 325 MG PO TBEC
650.0000 mg | DELAYED_RELEASE_TABLET | Freq: Four times a day (QID) | ORAL | Status: DC | PRN
  Filled 2021-07-28: qty 2

## 2021-07-28 MED ORDER — ONDANSETRON HCL 4 MG/2ML IJ SOLN
INTRAMUSCULAR | Status: DC | PRN
  Administered 2021-07-28: 4 mg via INTRAVENOUS

## 2021-07-28 MED ORDER — ONDANSETRON HCL 4 MG/2ML IJ SOLN: 4.0000 mg | Freq: Four times a day (QID) | INTRAMUSCULAR | Status: DC | PRN

## 2021-07-28 MED ORDER — LIDOCAINE 2% (20 MG/ML) 5 ML SYRINGE
INTRAMUSCULAR | Status: DC | PRN
  Administered 2021-07-28: 60 mg via INTRAVENOUS

## 2021-07-28 MED ORDER — MENTHOL 3 MG MT LOZG: 1.0000 | LOZENGE | OROMUCOSAL | Status: DC | PRN

## 2021-07-28 MED ORDER — SODIUM CHLORIDE 0.9 % IV SOLN: 250.0000 mL | INTRAVENOUS | Status: DC

## 2021-07-28 MED ORDER — ACETAMINOPHEN 650 MG RE SUPP: 650.0000 mg | RECTAL | Status: DC | PRN

## 2021-07-28 MED ORDER — MIDAZOLAM HCL 5 MG/5ML IJ SOLN
INTRAMUSCULAR | Status: DC | PRN
  Administered 2021-07-28: 2 mg via INTRAVENOUS

## 2021-07-28 MED ORDER — POTASSIUM 99 MG PO TABS: 99.0000 mg | ORAL_TABLET | Freq: Every day | ORAL | Status: DC

## 2021-07-28 MED ORDER — PROPOFOL 10 MG/ML IV BOLUS
INTRAVENOUS | Status: DC | PRN
  Administered 2021-07-28: 50 mg via INTRAVENOUS
  Administered 2021-07-28: 150 mg via INTRAVENOUS
  Administered 2021-07-28: 40 mg via INTRAVENOUS

## 2021-07-28 MED ORDER — ESCITALOPRAM OXALATE 10 MG PO TABS: 10.0000 mg | ORAL_TABLET | Freq: Every day | ORAL | Status: DC

## 2021-07-28 MED ORDER — FENTANYL CITRATE (PF) 250 MCG/5ML IJ SOLN
INTRAMUSCULAR | Status: DC | PRN
  Administered 2021-07-28: 100 ug via INTRAVENOUS
  Administered 2021-07-28 (×3): 50 ug via INTRAVENOUS

## 2021-07-28 MED ORDER — ONDANSETRON HCL 4 MG PO TABS: 4.0000 mg | ORAL_TABLET | Freq: Four times a day (QID) | ORAL | Status: DC | PRN

## 2021-07-28 MED ORDER — SODIUM CHLORIDE 0.9% FLUSH: 3.0000 mL | INTRAVENOUS | Status: DC | PRN

## 2021-07-28 MED ORDER — HYDROCHLOROTHIAZIDE 12.5 MG PO TABS: 12.5000 mg | ORAL_TABLET | Freq: Every day | ORAL | Status: DC

## 2021-07-28 MED ORDER — ORAL CARE MOUTH RINSE: 15.0000 mL | Freq: Once | OROMUCOSAL | Status: AC

## 2021-07-28 MED ORDER — PANTOPRAZOLE SODIUM 40 MG IV SOLR: 40.0000 mg | Freq: Every day | INTRAVENOUS | Status: DC

## 2021-07-28 MED ORDER — CHLORHEXIDINE GLUCONATE 0.12 % MT SOLN
15.0000 mL | Freq: Once | OROMUCOSAL | Status: AC
  Administered 2021-07-28: 15 mL via OROMUCOSAL
  Filled 2021-07-28: qty 15

## 2021-07-28 MED ORDER — LISINOPRIL 20 MG PO TABS: 20.0000 mg | ORAL_TABLET | Freq: Every day | ORAL | Status: DC

## 2021-07-28 MED ORDER — METHOCARBAMOL 500 MG PO TABS: 500.0000 mg | ORAL_TABLET | Freq: Four times a day (QID) | ORAL | 0 refills | Status: DC

## 2021-07-28 MED ORDER — CEFAZOLIN SODIUM-DEXTROSE 2-4 GM/100ML-% IV SOLN
2.0000 g | INTRAVENOUS | Status: AC
  Administered 2021-07-28: 2 g via INTRAVENOUS
  Filled 2021-07-28: qty 100

## 2021-07-28 MED ORDER — ACETAMINOPHEN 500 MG PO TABS
1000.0000 mg | ORAL_TABLET | Freq: Once | ORAL | Status: AC
  Administered 2021-07-28: 1000 mg via ORAL
  Filled 2021-07-28: qty 2

## 2021-07-28 SURGICAL SUPPLY — 61 items
ADH SKN CLS APL DERMABOND .7 (GAUZE/BANDAGES/DRESSINGS) ×1
APL SKNCLS STERI-STRIP NONHPOA (GAUZE/BANDAGES/DRESSINGS) ×1
BAG COUNTER SPONGE SURGICOUNT (BAG) ×3 IMPLANT
BAG SPNG CNTER NS LX DISP (BAG) ×2
BAND INSRT 18 STRL LF DISP RB (MISCELLANEOUS) ×2
BAND RUBBER #18 3X1/16 STRL (MISCELLANEOUS) ×4 IMPLANT
BASKET BONE COLLECTION (BASKET) ×2 IMPLANT
BENZOIN TINCTURE PRP APPL 2/3 (GAUZE/BANDAGES/DRESSINGS) ×2 IMPLANT
BIT DRILL NEURO 2X3.1 SFT TUCH (MISCELLANEOUS) ×1 IMPLANT
BONE VIVIGEN FORMABLE 5.4CC (Bone Implant) ×2 IMPLANT
BUR MATCHSTICK NEURO 3.0 LAGG (BURR) ×2 IMPLANT
CANISTER SUCT 3000ML PPV (MISCELLANEOUS) ×2 IMPLANT
CARTRIDGE OIL MAESTRO DRILL (MISCELLANEOUS) ×1 IMPLANT
DERMABOND ADVANCED (GAUZE/BANDAGES/DRESSINGS) ×1
DERMABOND ADVANCED .7 DNX12 (GAUZE/BANDAGES/DRESSINGS) IMPLANT
DIFFUSER DRILL AIR PNEUMATIC (MISCELLANEOUS) ×2 IMPLANT
DRAPE C-ARM 42X72 X-RAY (DRAPES) ×4 IMPLANT
DRAPE LAPAROTOMY 100X72 PEDS (DRAPES) ×2 IMPLANT
DRAPE MICROSCOPE LEICA (MISCELLANEOUS) ×2 IMPLANT
DRILL NEURO 2X3.1 SOFT TOUCH (MISCELLANEOUS) ×2
DRSG OPSITE POSTOP 4X6 (GAUZE/BANDAGES/DRESSINGS) ×1 IMPLANT
DURAPREP 6ML APPLICATOR 50/CS (WOUND CARE) ×2 IMPLANT
ELECT COATED BLADE 2.86 ST (ELECTRODE) ×2 IMPLANT
ELECT REM PT RETURN 9FT ADLT (ELECTROSURGICAL) ×2
ELECTRODE REM PT RTRN 9FT ADLT (ELECTROSURGICAL) ×1 IMPLANT
GAUZE 4X4 16PLY ~~LOC~~+RFID DBL (SPONGE) ×1 IMPLANT
GAUZE SPONGE 4X4 12PLY STRL (GAUZE/BANDAGES/DRESSINGS) ×2 IMPLANT
GLOVE EXAM NITRILE XL STR (GLOVE) IMPLANT
GLOVE SURG ENC MOIS LTX SZ7 (GLOVE) ×1 IMPLANT
GLOVE SURG ENC MOIS LTX SZ8 (GLOVE) ×2 IMPLANT
GLOVE SURG UNDER LTX SZ8.5 (GLOVE) ×2 IMPLANT
GLOVE SURG UNDER POLY LF SZ7 (GLOVE) IMPLANT
GOWN STRL REUS W/ TWL LRG LVL3 (GOWN DISPOSABLE) IMPLANT
GOWN STRL REUS W/ TWL XL LVL3 (GOWN DISPOSABLE) ×1 IMPLANT
GOWN STRL REUS W/TWL 2XL LVL3 (GOWN DISPOSABLE) IMPLANT
GOWN STRL REUS W/TWL LRG LVL3 (GOWN DISPOSABLE) ×2
GOWN STRL REUS W/TWL XL LVL3 (GOWN DISPOSABLE) ×4
GRAFT BNE MATRIX VG FRMBL MD 5 (Bone Implant) IMPLANT
HALTER HD/CHIN CERV TRACTION D (MISCELLANEOUS) ×2 IMPLANT
HEMOSTAT POWDER KIT SURGIFOAM (HEMOSTASIS) ×2 IMPLANT
KIT BASIN OR (CUSTOM PROCEDURE TRAY) ×2 IMPLANT
KIT TURNOVER KIT B (KITS) ×2 IMPLANT
NDL SPNL 20GX3.5 QUINCKE YW (NEEDLE) ×1 IMPLANT
NEEDLE SPNL 20GX3.5 QUINCKE YW (NEEDLE) ×2 IMPLANT
NS IRRIG 1000ML POUR BTL (IV SOLUTION) ×3 IMPLANT
OIL CARTRIDGE MAESTRO DRILL (MISCELLANEOUS) ×2
PACK LAMINECTOMY NEURO (CUSTOM PROCEDURE TRAY) ×2 IMPLANT
PAD ARMBOARD 7.5X6 YLW CONV (MISCELLANEOUS) ×3 IMPLANT
PIN DISTRACTION 14MM (PIN) IMPLANT
PLATE ANT CERV XTEND 2 LV 28 (Plate) ×1 IMPLANT
SCREW VAR 4.2 XD SELF DRILL 14 (Screw) ×6 IMPLANT
SPACER C HEDRON 12X14 7M 7D (Spacer) ×2 IMPLANT
SPONGE INTESTINAL PEANUT (DISPOSABLE) ×3 IMPLANT
SPONGE SURGIFOAM ABS GEL SZ50 (HEMOSTASIS) ×2 IMPLANT
STRIP CLOSURE SKIN 1/2X4 (GAUZE/BANDAGES/DRESSINGS) ×2 IMPLANT
SUT VIC AB 3-0 SH 8-18 (SUTURE) ×2 IMPLANT
SUT VICRYL 4-0 PS2 18IN ABS (SUTURE) ×2 IMPLANT
TAPE CLOTH 4X10 WHT NS (GAUZE/BANDAGES/DRESSINGS) ×1 IMPLANT
TOWEL GREEN STERILE (TOWEL DISPOSABLE) ×2 IMPLANT
TOWEL GREEN STERILE FF (TOWEL DISPOSABLE) ×2 IMPLANT
WATER STERILE IRR 1000ML POUR (IV SOLUTION) ×2 IMPLANT

## 2021-07-28 NOTE — Evaluation (Signed)
Physical Therapy Evaluation Patient Details Name: Devon Wood MRN: [id number] DOB: 09-Oct-1960 Today's Date: 07/28/2021  History of Present Illness  pt is a 60 y/o male with neck, bil should and arm pain admitted 11/23 for elective ACDF at C45 and C56 with cages, plating and artografting.  PMHx: CAD with stenting, DM, Gout, HTN, lumbar fusions  Clinical Impression  Pt is close to baseline functioning and should be safe at home. There are no further acute PT needs.  Will sign off at this time.        Recommendations for follow up therapy are one component of a multi-disciplinary discharge planning process, led by the attending physician.  Recommendations may be updated based on patient status, additional functional criteria and insurance authorization.  Follow Up Recommendations No PT follow up    Assistance Recommended at Discharge Intermittent Supervision/Assistance  Functional Status Assessment Patient has had a recent decline in their functional status and demonstrates the ability to make significant improvements in function in a reasonable and predictable amount of time.  Equipment Recommendations  None recommended by PT    Recommendations for Other Services       Precautions / Restrictions Precautions Precautions: Fall;Cervical Precaution Booklet Issued: No (per pt) Required Braces or Orthoses: Cervical Brace (for comfort/ reminding of cervical precautions) Cervical Brace: Soft collar;For comfort      Mobility  Bed Mobility Overal bed mobility: Needs Assistance Bed Mobility: Rolling;Sidelying to Sit Rolling: Supervision Sidelying to sit: Supervision       General bed mobility comments: cues for best technique, pt transitioned without assist    Transfers Overall transfer level: Needs assistance   Transfers: Sit to/from Stand Sit to Stand: Supervision                Ambulation/Gait Ambulation/Gait assistance: Supervision Gait Distance (Feet): 300  Feet Assistive device: IV Pole;None Gait Pattern/deviations: Step-through pattern   Gait velocity interpretation: >2.62 ft/sec, indicative of community ambulatory   General Gait Details: short mildly unsteady gait, but safe without need for AD  Stairs            Wheelchair Mobility    Modified Rankin (Stroke Patients Only)       Balance Overall balance assessment: Mild deficits observed, not formally tested                                           Pertinent Vitals/Pain Pain Assessment: No/denies pain    Home Living Family/patient expects to be discharged to:: Private residence Living Arrangements: Spouse/significant other Available Help at Discharge: Family;Available 24 hours/day Type of Home: House Home Access: Ramped entrance       Home Layout: One level Home Equipment: Cane - single point      Prior Function Prior Level of Function : Independent/Modified Independent                     Hand Dominance        Extremity/Trunk Assessment   Upper Extremity Assessment Upper Extremity Assessment: Overall WFL for tasks assessed    Lower Extremity Assessment Lower Extremity Assessment: RLE deficits/detail;LLE deficits/detail RLE Deficits / Details: still numb and effortful AROM at shoulders, but improved RLE Coordination: decreased fine motor LLE Deficits / Details: numb and effortful shoulder flexion, but improved per pt. LLE Coordination: decreased fine motor    Cervical / Trunk Assessment Cervical /  Trunk Assessment: Neck Surgery  Communication   Communication: No difficulties  Cognition Arousal/Alertness: Awake/alert Behavior During Therapy: WFL for tasks assessed/performed Overall Cognitive Status: Within Functional Limits for tasks assessed                                          General Comments General comments (skin integrity, edema, etc.): pt instructed in cervical care/prec, log roll, bracing  issues, lifting precautions and progression of activity after d/c.  pt verbalized/demo'd    Exercises     Assessment/Plan    PT Assessment Patient does not need any further PT services  PT Problem List         PT Treatment Interventions      PT Goals (Current goals can be found in the Care Plan section)  Acute Rehab PT Goals Patient Stated Goal: home today PT Goal Formulation: All assessment and education complete, DC therapy    Frequency     Barriers to discharge        Co-evaluation               AM-PAC PT "6 Clicks" Mobility  Outcome Measure Help needed turning from your back to your side while in a flat bed without using bedrails?: None Help needed moving from lying on your back to sitting on the side of a flat bed without using bedrails?: None Help needed moving to and from a bed to a chair (including a wheelchair)?: None Help needed standing up from a chair using your arms (e.g., wheelchair or bedside chair)?: None Help needed to walk in hospital room?: A Little Help needed climbing 3-5 steps with a railing? : A Little 6 Click Score: 22    End of Session   Activity Tolerance: Patient tolerated treatment well Patient left: in bed;with call bell/phone within reach;Other (comment) (sitting EOB) Nurse Communication: Mobility status PT Visit Diagnosis: Other abnormalities of gait and mobility (R26.89)    Time: 6415-8309 PT Time Calculation (min) (ACUTE ONLY): 21 min   Charges:   PT Evaluation $PT Eval Low Complexity: 1 Low          07/28/2021  Jacinto Halim., PT Acute Rehabilitation Services [phone]  (pager) [phone]  (office)  Eliseo Gum Talen Poser 07/28/2021, 5:07 PM

## 2021-07-28 NOTE — Discharge Summary (Signed)
Physician Discharge Summary  Patient ID: Devon Wood MRN: [id number] DOB/AGE: 59-Oct-1962 59 y.o.  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 The patient tolerated the procedure well and was taken to the recovery room and then to the floor in stable condition. The hospital course was routine. There were no complications. The wound remained clean dry and intact. Pt had appropriate neck soreness. No complaints of arm pain or new N/T/W. The patient remained afebrile with stable vital signs, and tolerated a regular diet. The patient continued to increase activities, and pain was well controlled with oral pain medications.   Consults: None  Significant Diagnostic Studies:  Results for orders placed or performed during the hospital encounter of 07/28/21  SARS Coronavirus 2 by RT PCR (hospital order, performed in [facility] hospital lab) Nasopharyngeal Nasopharyngeal Swab   Specimen: Nasopharyngeal Swab  Result Value Ref Range   SARS Coronavirus 2 NEGATIVE NEGATIVE  Surgical pcr screen   Specimen: Nasal Mucosa; Nasal Swab  Result Value Ref Range   MRSA, PCR NEGATIVE NEGATIVE   Staphylococcus aureus POSITIVE (A) NEGATIVE  Glucose, capillary  Result Value Ref Range   Glucose-Capillary 133 (H) 70 - 99 mg/dL  Glucose, capillary  Result Value Ref Range   Glucose-Capillary 177 (H) 70 - 99 mg/dL  Type and screen  Result Value Ref Range   ABO/RH(D) A POS    Antibody Screen NEG    Sample Expiration      07/31/2021,2359 Performed at [facility], [address], [place], [place] [address]     DG Cervical Spine 1 View  Result Date: 07/28/2021 CLINICAL DATA:   C4-6 ACDF EXAM: DG CERVICAL SPINE - 1 VIEW COMPARISON:  02/16/2021 FINDINGS: Three fluoroscopic images are obtained during the performance of the procedure and are provided for interpretation only. Evaluation is limited due to overlying structures. Postsurgical changes are seen from anterior plate and screw fixation from a C4-6 ACDF. Alignment appears anatomic. Endotracheal tube is identified. FLUOROSCOPY TIME:  9 seconds IMPRESSION: 1. C4-6 ACDF. Electronically Signed   By: Sharlet Salina M.D.   On: 07/28/2021 15:15   DG C-Arm 1-60 Min-No Report  Result Date: 07/28/2021 Fluoroscopy was utilized by the requesting physician.  No radiographic interpretation.   DG C-Arm 1-60 Min-No Report  Result Date: 07/28/2021 Fluoroscopy was utilized by the requesting physician.  No radiographic interpretation.   ECHOCARDIOGRAM COMPLETE  Result Date: 07/22/2021    ECHOCARDIOGRAM REPORT   Patient Name:   Devon Wood Westrich Date of Exam: 07/22/2021 Medical Rec #:  [id number]     Height:       66.0 in Accession #:    [id number]    Weight:       227.0 lb Date of Birth:  15-Feb-1961     BSA:          2.110 m Patient Age:    60 years      BP:           140/96 mmHg Patient Gender: M             HR:           98 bpm. Exam Location:  [facility] Procedure: 2D Echo, Cardiac  Doppler, Color Doppler and Intracardiac            Opacification Agent Indications:    I25.110 Atherosclerotic heart disease of native coronary artery                 with unstable angina pectoris  History:        Patient has prior history of Echocardiogram examinations, most                 recent 04/30/2003. CAD; Risk Factors:Hypertension, Diabetes and                 Dyslipidemia.  Sonographer:    San Jetty RDCS, RVT Sonographer#2:  Dondra Prader RVT Referring Phys: [id number] MICHELE M LENZE IMPRESSIONS  1. Left ventricular ejection fraction, by estimation, is 55%. The left ventricle has normal function. The left ventricle has no regional wall motion abnormalities.  Left ventricular diastolic parameters are consistent with Grade I diastolic dysfunction (impaired relaxation).  2. Right ventricular systolic function is normal. The right ventricular size is normal.  3. The mitral valve is normal in structure. No evidence of mitral valve regurgitation.  4. The aortic valve is grossly normal. Aortic valve regurgitation is not visualized.  5. Aortic dilatation noted. There is mild dilatation of the ascending aorta, measuring 40 mm.  6. The inferior vena cava is normal in size with greater than 50% respiratory variability, suggesting right atrial pressure of 3 mmHg. Comparison(s): LVEF 55%, Aorta 4.3 cm. FINDINGS  Left Ventricle: Left ventricular ejection fraction, by estimation, is 55%. The left ventricle has normal function. The left ventricle has no regional wall motion abnormalities. Definity contrast agent was given IV to delineate the left ventricular endocardial borders. The left ventricular internal cavity size was normal in size. There is no left ventricular hypertrophy. Left ventricular diastolic parameters are consistent with Grade I diastolic dysfunction (impaired relaxation). Right Ventricle: The right ventricular size is normal. No increase in right ventricular wall thickness. Right ventricular systolic function is normal. Left Atrium: Left atrial size was normal in size. Right Atrium: Right atrial size was normal in size. Pericardium: There is no evidence of pericardial effusion. Mitral Valve: The mitral valve is normal in structure. No evidence of mitral valve regurgitation. Tricuspid Valve: The tricuspid valve is normal in structure. Tricuspid valve regurgitation is not demonstrated. Aortic Valve: The aortic valve is grossly normal. Aortic valve regurgitation is not visualized. Aortic valve mean gradient measures 4.0 mmHg. Aortic valve peak gradient measures 7.3 mmHg. Aortic valve area, by VTI measures 3.73 cm. Pulmonic Valve: The pulmonic valve was not well  visualized. Pulmonic valve regurgitation is not visualized. Aorta: Aortic dilatation noted. There is mild dilatation of the ascending aorta, measuring 40 mm. Venous: The inferior vena cava is normal in size with greater than 50% respiratory variability, suggesting right atrial pressure of 3 mmHg. IAS/Shunts: No atrial level shunt detected by color flow Doppler.  LEFT VENTRICLE PLAX 2D LVIDd:         4.70 cm   Diastology LVIDs:         3.20 cm   LV e' medial:    5.77 cm/s LV PW:         1.10 cm   LV E/e' medial:  15.2 LV IVS:        0.90 cm   LV e' lateral:   9.14 cm/s LVOT diam:     2.40 cm   LV E/e' lateral: 9.6 LV SV:  100 LV SV Index:   47 LVOT Area:     4.52 cm  RIGHT VENTRICLE             IVC RV S prime:     12.30 cm/s  IVC diam: 1.50 cm TAPSE (M-mode): 2.1 cm LEFT ATRIUM           Index LA Vol (A4C): 40.4 ml 19.14 ml/m  AORTIC VALVE                    PULMONIC VALVE AV Area (Vmax):    3.45 cm     PV Vmax:       0.94 m/s AV Area (Vmean):   3.34 cm     PV Peak grad:  3.5 mmHg AV Area (VTI):     3.73 cm AV Vmax:           135.00 cm/s AV Vmean:          92.300 cm/s AV VTI:            0.267 m AV Peak Grad:      7.3 mmHg AV Mean Grad:      4.0 mmHg LVOT Vmax:         103.00 cm/s LVOT Vmean:        68.100 cm/s LVOT VTI:          0.220 m LVOT/AV VTI ratio: 0.82  AORTA Ao Root diam: 3.80 cm Ao Asc diam:  4.00 cm Ao Arch diam: 2.8 cm MITRAL VALVE                TRICUSPID VALVE MV Area (PHT): 5.93 cm     TR Peak grad:   10.1 mmHg MV Decel Time: 128 msec     TR Vmax:        159.00 cm/s MV E velocity: 87.80 cm/s MV A velocity: 126.00 cm/s  SHUNTS MV E/A ratio:  0.70         Systemic VTI:  0.22 m                             Systemic Diam: 2.40 cm Debbe Odea MD Electronically signed by Debbe Odea MD Signature Date/Time: 07/22/2021/6:34:54 PM    Final     Antibiotics:  Anti-infectives (From admission, onward)    Start     Dose/Rate Route Frequency Ordered Stop   07/28/21 1930  ceFAZolin  (ANCEF) IVPB 2g/100 mL premix        2 g 200 mL/hr over 30 Minutes Intravenous Every 8 hours 07/28/21 1531 07/29/21 1129   07/28/21 0815  ceFAZolin (ANCEF) IVPB 2g/100 mL premix        2 g 200 mL/hr over 30 Minutes Intravenous On call to O.R. 07/28/21 0811 07/28/21 1138       Discharge Exam: Blood pressure (!) 145/76, pulse 90, temperature 98.4 F (36.9 C), temperature source Oral, resp. rate 20, height 5\' 7"  (1.702 m), weight 99.8 kg, SpO2 100 %. Neurologic: Grossly normal Ambulating and voiding well incision cdi   Discharge Medications:   Allergies as of 07/28/2021       Reactions   Erythromycin Rash        Medication List     TAKE these medications    allopurinol 100 MG tablet Commonly known as: ZYLOPRIM Take 100 mg by mouth daily.   aspirin EC 325 MG tablet Take 650 mg by mouth every 6 (six)  hours as needed for moderate pain.   aspirin 81 MG tablet Take 81 mg by mouth daily.   colchicine 0.6 MG tablet Take 0.6 mg by mouth daily as needed (For gout flares).   escitalopram 10 MG tablet Commonly known as: LEXAPRO Take 10 mg by mouth daily.   famotidine 20 MG tablet Commonly known as: PEPCID Take 20 mg by mouth daily.   fluticasone 50 MCG/ACT nasal spray Commonly known as: FLONASE Place 2 sprays into both nostrils daily.   HYDROcodone-acetaminophen 5-325 MG tablet Commonly known as: NORCO/VICODIN Take 1 tablet by mouth every 4 (four) hours as needed for moderate pain.   indomethacin 50 MG capsule Commonly known as: INDOCIN Take 50 mg by mouth daily as needed (gout flare).   lisinopril-hydrochlorothiazide 20-12.5 MG tablet Commonly known as: ZESTORETIC Take 1 tablet by mouth daily.   meloxicam 15 MG tablet Commonly known as: MOBIC Take 15 mg by mouth daily.   metFORMIN 500 MG tablet Commonly known as: GLUCOPHAGE Take 1,000 mg by mouth 2 (two) times daily with a meal.   methocarbamol 500 MG tablet Commonly known as: Robaxin Take 1 tablet (500  mg total) by mouth 4 (four) times daily.   metoprolol succinate 25 MG 24 hr tablet Commonly known as: Toprol XL Take 1 tablet (25 mg total) by mouth daily.   Potassium 99 MG Tabs Take 99 mg by mouth daily.   simvastatin 40 MG tablet Commonly known as: ZOCOR Take 20 mg by mouth daily.   sitaGLIPtin 100 MG tablet Commonly known as: JANUVIA Take 50 mg by mouth daily.        Disposition: home   Final Dx: acdf C4-5, 5-6  Discharge Instructions      Remove dressing in 72 hours   Complete by: As directed    Call MD for:  difficulty breathing, headache or visual disturbances   Complete by: As directed    Call MD for:  hives   Complete by: As directed    Call MD for:  persistant nausea and vomiting   Complete by: As directed    Call MD for:  redness, tenderness, or signs of infection (pain, swelling, redness, odor or green/yellow discharge around incision site)   Complete by: As directed    Call MD for:  severe uncontrolled pain   Complete by: As directed    Call MD for:  temperature >100.4   Complete by: As directed    Diet - low sodium heart healthy   Complete by: As directed    Increase activity slowly   Complete by: As directed           Signed: Tiana Loft Jericha Bryden 07/28/2021, 5:25 PM

## 2021-07-28 NOTE — Anesthesia Postprocedure Evaluation (Signed)
Anesthesia Post Note  Patient: Devon Wood  Procedure(s) Performed: Anterior Cervical Decompression Fusion Cervical four-five,Cervical five-six     Patient location during evaluation: PACU Anesthesia Type: General Level of consciousness: awake and alert Pain management: pain level controlled Vital Signs Assessment: post-procedure vital signs reviewed and stable Respiratory status: spontaneous breathing, nonlabored ventilation and respiratory function stable Cardiovascular status: blood pressure returned to baseline and stable Postop Assessment: no apparent nausea or vomiting Anesthetic complications: no   No notable events documented.  Last Vitals:  Vitals:   07/28/21 1430 07/28/21 1500  BP: (!) 151/82 (!) 163/76  Pulse: 88 90  Resp: 18 19  Temp:  36.6 C  SpO2: 96% 94%    Last Pain:  Vitals:   07/28/21 1500  TempSrc:   PainSc: 3                  [name],W. EDMOND

## 2021-07-28 NOTE — Anesthesia Procedure Notes (Signed)
Procedure Name: Intubation Date/Time: 07/28/2021 11:33 AM Performed by: Aundria Rud, CRNA Pre-anesthesia Checklist: Patient identified, Emergency Drugs available, Suction available and Patient being monitored Patient Re-evaluated:Patient Re-evaluated prior to induction Oxygen Delivery Method: Circle System Utilized Preoxygenation: Pre-oxygenation with 100% oxygen Induction Type: IV induction Ventilation: Mask ventilation without difficulty, Oral airway inserted - appropriate to patient size and Two handed mask ventilation required Laryngoscope Size: Glidescope and 4 Grade View: Grade I Tube type: Oral Tube size: 7.5 mm Number of attempts: 1 Airway Equipment and Method: Stylet and Oral airway Placement Confirmation: ETT inserted through vocal cords under direct vision, positive ETCO2 and breath sounds checked- equal and bilateral Secured at: 23 cm Tube secured with: Tape Dental Injury: Teeth and Oropharynx as per pre-operative assessment

## 2021-07-28 NOTE — Progress Notes (Signed)
Orthopedic Tech Progress Note Patient Details:  Devon Wood 15-Nov-1960 [id number]  Ortho Devices Type of Ortho Device: Soft collar Ortho Device/Splint Location: NECK Ortho Device/Splint Interventions: Ordered, Application   Post Interventions Patient Tolerated: Well Instructions Provided: Care of device  [name] 07/28/2021, 3:35 PM

## 2021-07-28 NOTE — Progress Notes (Signed)
PHARMACIST - PHYSICIAN ORDER COMMUNICATION  CONCERNING: P&T Medication Policy on Herbal Medications  DESCRIPTION:  This patient's order for:  Potassium chloride 99 mg (2.53 mEq)  has been noted.  This product(s) is classified as an "herbal" or natural product. Due to a lack of definitive safety studies or FDA approval, nonstandard manufacturing practices, plus the potential risk of unknown drug-drug interactions while on inpatient medications, the Pharmacy and Therapeutics Committee does not permit the use of "herbal" or natural products of this type within [place].   ACTION TAKEN: The pharmacy department is unable to verify this order at this time and your patient has been informed of this safety policy. Please reevaluate patient's clinical condition at discharge and address if the herbal or natural product(s) should be resumed at that time.  

## 2021-07-28 NOTE — Plan of Care (Signed)
Pt doing well. Pt given D/C instructions with verbal understanding. Rx's were sent to the pharmacy by MD. Pt's incision is clean and dry with no sign of infection. Pt's IV was removed prior to D/C. Pt D/C'd home via wheelchair per MD order. Pt is stable @ D/C and has no other needs at this time. Quanisha Drewry, RN  

## 2021-07-28 NOTE — H&P (Addendum)
Devon Wood is an 60 y.o. male.   Chief Complaint: Neck pain bilateral shoulder and arm pain HPI: 60 year old gentleman with progressive worsening neck pain bilateral shoulder and arm pain numbness and tingling in his hands and fingers and difficulty walking.  Work-up revealed severe cervical spinal stenosis with cord compression at C4-5 and C5-6.  Due to patient's progression of clinical syndrome imaging findings failed conservative treatment I recommended anterior cervical discectomies and fusion at those 2 levels.  I extensively went over the risks and benefits of that operation with him as well as perioperative course expectations of outcome and alternatives to surgery and he understands and agrees to proceed forward.  Past Medical History:  Diagnosis Date   Anxiety    Arthritis    Bronchitis    hx of;last time couple of yrs ago   Bronchitis    hx of   CAD (coronary artery disease) 10/10/2013   2004 2.0 x 25 mm drug-eluting cypher stent to LAD    Chronic back pain    L3 buldging disc;DDD   Complication of anesthesia    20-30 second asystole episode during induction  04/2003,  was on b-blocker tx at the time, found to have 80% LAD lesion s/p stent (but unclear if CAD was the direct cause)    Coronary artery disease 2004   stent placement, sees [facility]   Depression    Diabetes mellitus    takes Glipizide and Metformin daily   DM type 2 causing complication (HCC) 10/10/2013   GERD (gastroesophageal reflux disease)    takes Pepcid daily   Gout    hx of   History of kidney stones    HTN (hypertension) 10/10/2013   Hypercholesteremia    takes Simvastatin daily   Hypertension    takes Lisinopril daily   Joint pain    Mixed hyperlipidemia 10/10/2013   Morbid obesity (HCC) 10/10/2013   Nocturia    Peptic ulcer     Past Surgical History:  Procedure Laterality Date   BACK SURGERY     CARDIAC CATHETERIZATION  11/27/2009   widely patent stent    COLONOSCOPY WITH PROPOFOL N/A 03/17/2015   Procedure: COLONOSCOPY WITH PROPOFOL;  Surgeon: Charolett Bumpers, MD;  Location: [facility];  Service: Endoscopy;  Laterality: N/A;   CORONARY STENT PLACEMENT  05/30/2003   proximal LAD   ESOPHAGOGASTRODUODENOSCOPY  in the 90's   HAND SURGERY  1980   right   LUMBAR DISC SURGERY  2002   L5   LUMBAR FUSION  2004   NM MYOCAR PERF WALL MOTION  05/19/2004   no evidence of ischemia   TONSILLECTOMY  1969   US ECHOCARDIOGRAPHY  04/30/2003   mildly dilated aortic root,mild LA enlargement,mild concentric LVH,trivial mitral and tricuspid regurg.,mild mitral annular ca+    Family History  Problem Relation Age of Onset   COPD Mother    Heart attack Father    COPD Brother    COPD Brother    Dementia Maternal Grandmother    Cancer Maternal Grandfather    Heart failure Paternal Grandmother    Heart attack Paternal Grandfather    Hypertension Sister    Anesthesia problems Neg Hx    Hypotension Neg Hx    Malignant hyperthermia Neg Hx    Pseudochol deficiency Neg Hx    Social History:  reports that he quit smoking about 38 years ago. His smoking use included cigarettes. He has a 2.00 pack-year smoking history. He  has quit using smokeless tobacco.  His smokeless tobacco use included chew. He reports current alcohol use. He reports that he does not use drugs.  Allergies:  Allergies  Allergen Reactions   Erythromycin Rash    Medications Prior to Admission  Medication Sig Dispense Refill   allopurinol (ZYLOPRIM) 100 MG tablet Take 100 mg by mouth daily.      aspirin 81 MG tablet Take 81 mg by mouth daily.     aspirin EC 325 MG tablet Take 650 mg by mouth every 6 (six) hours as needed for moderate pain.     escitalopram (LEXAPRO) 10 MG tablet Take 10 mg by mouth daily.     famotidine (PEPCID) 20 MG tablet Take 20 mg by mouth daily.      fluticasone (FLONASE) 50 MCG/ACT nasal spray Place 2 sprays into both nostrils daily.      lisinopril-hydrochlorothiazide (PRINZIDE,ZESTORETIC) 20-12.5 MG per tablet Take 1 tablet by mouth daily.     meloxicam (MOBIC) 15 MG tablet Take 15 mg by mouth daily.     metFORMIN (GLUCOPHAGE) 500 MG tablet Take 1,000 mg by mouth 2 (two) times daily with a meal.      metoprolol succinate (TOPROL XL) 25 MG 24 hr tablet Take 1 tablet (25 mg total) by mouth daily. 90 tablet 3   Potassium 99 MG TABS Take 99 mg by mouth daily.     simvastatin (ZOCOR) 40 MG tablet Take 20 mg by mouth daily.       sitaGLIPtin (JANUVIA) 100 MG tablet Take 50 mg by mouth daily.     colchicine 0.6 MG tablet Take 0.6 mg by mouth daily as needed (For gout flares).     indomethacin (INDOCIN) 50 MG capsule Take 50 mg by mouth daily as needed (gout flare).      Results for orders placed or performed during the hospital encounter of 07/28/21 (from the past 48 hour(s))  SARS Coronavirus 2 by RT PCR (hospital order, performed in [facility] hospital lab) Nasopharyngeal Nasopharyngeal Swab     Status: None   Collection Time: 07/28/21  8:05 AM   Specimen: Nasopharyngeal Swab  Result Value Ref Range   SARS Coronavirus 2 NEGATIVE NEGATIVE    Comment: (NOTE) SARS-CoV-2 target nucleic acids are NOT DETECTED.  The SARS-CoV-2 RNA is generally detectable in upper and lower respiratory specimens during the acute phase of infection. The lowest concentration of SARS-CoV-2 viral copies this assay can detect is 250 copies / mL. A negative result does not preclude SARS-CoV-2 infection and should not be used as the sole basis for treatment or other patient management decisions.  A negative result may occur with improper specimen collection / handling, submission of specimen other than nasopharyngeal swab, presence of viral mutation(s) within the areas targeted by this assay, and inadequate number of viral copies (<250 copies / mL). A negative result must be combined with clinical observations, patient history, and epidemiological  information.  Fact Sheet for Patients:   BoilerBrush.com.cy  Fact Sheet for Healthcare Providers: https://pope.com/  This test is not yet approved or  cleared by the [place] FDA and has been authorized for detection and/or diagnosis of SARS-CoV-2 by FDA under an Emergency Use Authorization (EUA).  This EUA will remain in effect (meaning this test can be used) for the duration of the COVID-19 declaration under Section 564(b)(1) of the Act, 21 U.S.C. section 360bbb-3(b)(1), unless the authorization is terminated or revoked sooner.  Performed at [facility], [address], [place], [place] [address]   Glucose, capillary     Status: Abnormal   Collection Time: 07/28/21  8:28 AM  Result Value Ref Range   Glucose-Capillary 133 (H) 70 - 99 mg/dL    Comment: Glucose reference range applies only to samples taken after fasting for at least 8 hours.  Type and screen     Status: None   Collection Time: 07/28/21  8:30 AM  Result Value Ref Range   ABO/RH(D) A POS    Antibody Screen NEG    Sample Expiration      07/31/2021,2359 Performed at [facility], [address], [place], [place] [address]   Surgical pcr screen     Status: Abnormal   Collection Time: 07/28/21  8:43 AM   Specimen: Nasal Mucosa; Nasal Swab  Result Value Ref Range   MRSA, PCR NEGATIVE NEGATIVE   Staphylococcus aureus POSITIVE (A) NEGATIVE    Comment: (NOTE) The Xpert SA Assay (FDA approved for NASAL specimens in patients 55 years of age and older), is one component of a comprehensive surveillance program. It is not intended to diagnose infection nor to guide or monitor treatment. Performed at [facility], [address], [place], [place] [address]    No results found.  Review of Systems  Musculoskeletal:  Positive for neck pain.  Neurological:  Positive for weakness and numbness.   Blood pressure (!) 166/76, pulse 79, temperature 98.8  F (37.1 C), temperature source Oral, resp. rate 18, height 5\' 7"  (1.702 m), weight 99.8 kg, SpO2 99 %. Physical Exam HENT:     Head: Normocephalic.     Right Ear: Tympanic membrane normal.     Nose: Nose normal.     Mouth/Throat:     Mouth: Mucous membranes are moist.  Cardiovascular:     Rate and Rhythm: Normal rate.  Pulmonary:     Effort: Pulmonary effort is normal.  Abdominal:     General: Abdomen is flat.  Musculoskeletal:        General: Normal range of motion.  Neurological:     General: No focal deficit present.     Mental Status: He is alert.     Comments: Strength is 5-5 bicep, tricep, wrist flexion, wrist extension, hand intrinsics on the left on the right he is got a 4 to 5-week right tricep and both deltoids are 4 out of 5 to 60 degrees but he is not able to get him to 90 or over his head     Assessment/Plan 60 year old presents for an ACDF C4-5 C5-6  41, MD 07/28/2021, 10:36 AM

## 2021-07-28 NOTE — Transfer of Care (Signed)
Immediate Anesthesia Transfer of Care Note  Patient: Devon Wood  Procedure(s) Performed: Anterior Cervical Decompression Fusion Cervical four-five,Cervical five-six  Patient Location: PACU  Anesthesia Type:General  Level of Consciousness: awake, alert  and oriented  Airway & Oxygen Therapy: Patient Spontanous Breathing  Post-op Assessment: Report given to RN and Post -op Vital signs reviewed and stable  Post vital signs: Reviewed and stable  Last Vitals:  Vitals Value Taken Time  BP 152/70 07/28/21 1401  Temp    Pulse 84 07/28/21 1401  Resp 20 07/28/21 1401  SpO2 92 % 07/28/21 1401  Vitals shown include unvalidated device data.  Last Pain:  Vitals:   07/28/21 0843  TempSrc:   PainSc: 0-No pain         Complications: No notable events documented.

## 2021-07-28 NOTE — Op Note (Signed)
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  Work-up revealed severe cord compression cervical stenosis at C4-5 and C5-6.  Due to patient's progression of clinical syndrome imaging findings and failure of conservative treatment I recommended anterior cervical discectomy and fusion at those 2 levels.  I extensively went over the risks and benefits of the operation with him as well as perioperative course expectations of outcome and alternatives to surgery and he understood and agreed to proceed forward.  Operative procedure: Patient was brought into the OR was [place]on general anesthesia positioned supine with neck extension 5 pounds halter traction the right side was next prepped and draped in routine sterile fashion.  Preoperative x-ray localized the appropriate level so a curvilinear incision was made just off the midline to the entry border of the sternocleidomastoid and the superficial of platysma was dissected out divided longitudinally the avascular plane between the sternomastoid and strap muscle was developed down to the prevertebral fascia and prevertebral fascia was dissected away with Kitners.  Intraoperative x-ray confirmed identification appropriate level.  So annulotomy's were made with a 15 blade scalpel longus was reflected laterally self-retaining retractors were placed.  Anterior osteophytes were bitten off the Leksell  rongeur and a 3 Miller Kerrison punch.  Disc base were drilled down capturing the bone shavings and mucus trap and under microscope illumination first working at C5-6 the disc base was further drilled down exposing the posterior annulus posterior logical ligament.  This was all removed in piecemeal fashion I aggressively under Bitton both vertebral bodies decompressing central canal marching laterally both C6 nerve roots identified both C6 nerve roots were skeletonized and decompressed flush with the pedicle.  This was then packed with Gelfoam and tension taken at C4-5 in a similar fashion C4 I was drilled down the bone shavings were removed Through the mucus trap aggressive under biting both endplates decompressing central canal and removing the posterior longitudinal ligament allowed indication thecal sac both C5 nerve roots were decompressed and skeletonized flush with the pedicle at the end of discectomy there is no further stenosis either centrally or foraminally.  I sized up to 7 mm lordotic implants and inserted those 2 mm deep to the anterior vertebral line.  I selected then a 28 mm globus extend plate all screws had excellent purchase locking mechanism was engaged.  Wound was then copiously irrigated with 6 hemostasis was maintained some additional bone graft been packed laterally to the cages and then the wound was closed in layers with active Vicryl and a running 4 subcuticular Dermabond benzoin Steri-Strips and a sterile dressing was applied patient recovery in stable condition.  At the end the case all needle counts and sponge counts were correct.

## 2021-07-29 ENCOUNTER — Encounter (HOSPITAL_COMMUNITY): Payer: Self-pay | Admitting: Neurosurgery

## 2021-08-02 ENCOUNTER — Other Ambulatory Visit: Payer: Self-pay

## 2021-08-02 DIAGNOSIS — E118 Type 2 diabetes mellitus with unspecified complications: Secondary | ICD-10-CM

## 2021-08-10 ENCOUNTER — Other Ambulatory Visit: Payer: Self-pay

## 2021-08-10 ENCOUNTER — Other Ambulatory Visit: Payer: Self-pay | Admitting: Neurosurgery

## 2021-08-10 NOTE — Progress Notes (Addendum)
Spoke with pt for pre-op call. Pt just had surgery on 07/28/21 and he states nothing has changed with his medical history, medications or allergies. I spoke with pt for that pre-op call.  Pt states his fasting blood sugar has been around 130. Pt will not take his Metformin or Januvia in the AM. Instructed him to check his blood sugar in the AM when he wakes up and every 2 hours until he leaves for the hospital. If blood sugar is >220 take 1/2 of usual correction dose of Novolog insulin. If blood sugar is 70 or below, treat with 1/2 cup of clear juice (apple or cranberry) and recheck blood sugar 15 minutes after drinking juice. If blood sugar continues to be 70 or below, call the Short Stay department and ask to speak to a nurse. Pt voiced understanding.  Will need Covid test on arrival.

## 2021-08-11 ENCOUNTER — Inpatient Hospital Stay (HOSPITAL_COMMUNITY)
Admission: RE | Admit: 2021-08-11 | Discharge: 2021-08-12 | DRG: 471 | Disposition: A | Discharge status: Home / Self Care | Payer: BC Managed Care – PPO | Attending: Neurosurgery | Admitting: Neurosurgery

## 2021-08-11 ENCOUNTER — Encounter (HOSPITAL_COMMUNITY): Payer: Self-pay | Admitting: Neurosurgery

## 2021-08-11 ENCOUNTER — Encounter (HOSPITAL_COMMUNITY)
Admission: RE | Disposition: A | Discharge status: Home / Self Care | Payer: Self-pay | Source: Home / Self Care | Attending: Neurosurgery

## 2021-08-11 ENCOUNTER — Inpatient Hospital Stay (HOSPITAL_COMMUNITY): Payer: BC Managed Care – PPO | Admitting: Anesthesiology

## 2021-08-11 ENCOUNTER — Inpatient Hospital Stay (HOSPITAL_COMMUNITY): Payer: BC Managed Care – PPO

## 2021-08-11 DIAGNOSIS — Z79899 Other long term (current) drug therapy: Secondary | ICD-10-CM

## 2021-08-11 DIAGNOSIS — Z87891 Personal history of nicotine dependence: Secondary | ICD-10-CM

## 2021-08-11 DIAGNOSIS — Z8711 Personal history of peptic ulcer disease: Secondary | ICD-10-CM

## 2021-08-11 DIAGNOSIS — Z7982 Long term (current) use of aspirin: Secondary | ICD-10-CM

## 2021-08-11 DIAGNOSIS — K219 Gastro-esophageal reflux disease without esophagitis: Secondary | ICD-10-CM | POA: Diagnosis present

## 2021-08-11 DIAGNOSIS — Z87442 Personal history of urinary calculi: Secondary | ICD-10-CM

## 2021-08-11 DIAGNOSIS — Z981 Arthrodesis status: Secondary | ICD-10-CM | POA: Diagnosis not present

## 2021-08-11 DIAGNOSIS — W19XXXA Unspecified fall, initial encounter: Secondary | ICD-10-CM | POA: Diagnosis present

## 2021-08-11 DIAGNOSIS — I2510 Atherosclerotic heart disease of native coronary artery without angina pectoris: Secondary | ICD-10-CM | POA: Diagnosis present

## 2021-08-11 DIAGNOSIS — M2578 Osteophyte, vertebrae: Secondary | ICD-10-CM | POA: Diagnosis present

## 2021-08-11 DIAGNOSIS — Z7984 Long term (current) use of oral hypoglycemic drugs: Secondary | ICD-10-CM | POA: Diagnosis not present

## 2021-08-11 DIAGNOSIS — Z955 Presence of coronary angioplasty implant and graft: Secondary | ICD-10-CM

## 2021-08-11 DIAGNOSIS — Z20822 Contact with and (suspected) exposure to covid-19: Secondary | ICD-10-CM | POA: Diagnosis present

## 2021-08-11 DIAGNOSIS — Z8249 Family history of ischemic heart disease and other diseases of the circulatory system: Secondary | ICD-10-CM

## 2021-08-11 DIAGNOSIS — M4712 Other spondylosis with myelopathy, cervical region: Secondary | ICD-10-CM | POA: Diagnosis present

## 2021-08-11 DIAGNOSIS — G992 Myelopathy in diseases classified elsewhere: Secondary | ICD-10-CM | POA: Diagnosis present

## 2021-08-11 DIAGNOSIS — E782 Mixed hyperlipidemia: Secondary | ICD-10-CM | POA: Diagnosis present

## 2021-08-11 DIAGNOSIS — S14124A Central cord syndrome at C4 level of cervical spinal cord, initial encounter: Secondary | ICD-10-CM | POA: Diagnosis present

## 2021-08-11 DIAGNOSIS — Z791 Long term (current) use of non-steroidal anti-inflammatories (NSAID): Secondary | ICD-10-CM | POA: Diagnosis not present

## 2021-08-11 DIAGNOSIS — I10 Essential (primary) hypertension: Secondary | ICD-10-CM | POA: Diagnosis present

## 2021-08-11 DIAGNOSIS — Z419 Encounter for procedure for purposes other than remedying health state, unspecified: Secondary | ICD-10-CM

## 2021-08-11 DIAGNOSIS — M1990 Unspecified osteoarthritis, unspecified site: Secondary | ICD-10-CM | POA: Diagnosis present

## 2021-08-11 DIAGNOSIS — Z6834 Body mass index (BMI) 34.0-34.9, adult: Secondary | ICD-10-CM | POA: Diagnosis not present

## 2021-08-11 DIAGNOSIS — M109 Gout, unspecified: Secondary | ICD-10-CM | POA: Diagnosis present

## 2021-08-11 DIAGNOSIS — Z881 Allergy status to other antibiotic agents status: Secondary | ICD-10-CM

## 2021-08-11 DIAGNOSIS — M4802 Spinal stenosis, cervical region: Principal | ICD-10-CM

## 2021-08-11 HISTORY — PX: POSTERIOR CERVICAL FUSION/FORAMINOTOMY: SHX5038

## 2021-08-11 LAB — GLUCOSE, CAPILLARY
Glucose-Capillary: 142 mg/dL — ABNORMAL HIGH (ref 70–99)
Glucose-Capillary: 168 mg/dL — ABNORMAL HIGH (ref 70–99)
Glucose-Capillary: 221 mg/dL — ABNORMAL HIGH (ref 70–99)
Glucose-Capillary: 325 mg/dL — ABNORMAL HIGH (ref 70–99)

## 2021-08-11 LAB — SARS CORONAVIRUS 2 BY RT PCR (HOSPITAL ORDER, PERFORMED IN ~~LOC~~ HOSPITAL LAB): SARS Coronavirus 2: NEGATIVE

## 2021-08-11 SURGERY — POSTERIOR CERVICAL FUSION/FORAMINOTOMY LEVEL 2
Anesthesia: General | Site: Spine Cervical

## 2021-08-11 MED ORDER — FAMOTIDINE 20 MG PO TABS: 20.0000 mg | ORAL_TABLET | Freq: Every day | ORAL | Status: DC

## 2021-08-11 MED ORDER — SODIUM CHLORIDE 0.9% FLUSH: 3.0000 mL | Freq: Two times a day (BID) | INTRAVENOUS | Status: DC

## 2021-08-11 MED ORDER — LISINOPRIL 20 MG PO TABS
20.0000 mg | ORAL_TABLET | Freq: Every day | ORAL | Status: DC
  Administered 2021-08-11: 20 mg via ORAL
  Filled 2021-08-11: qty 1

## 2021-08-11 MED ORDER — HYDROMORPHONE HCL 1 MG/ML IJ SOLN: 0.5000 mg | INTRAMUSCULAR | Status: DC | PRN

## 2021-08-11 MED ORDER — PROPOFOL 10 MG/ML IV BOLUS
INTRAVENOUS | Status: AC
  Filled 2021-08-11: qty 20

## 2021-08-11 MED ORDER — ROCURONIUM BROMIDE 10 MG/ML (PF) SYRINGE
PREFILLED_SYRINGE | INTRAVENOUS | Status: DC | PRN
  Administered 2021-08-11: 20 mg via INTRAVENOUS
  Administered 2021-08-11: 50 mg via INTRAVENOUS
  Administered 2021-08-11: 30 mg via INTRAVENOUS
  Administered 2021-08-11: 20 mg via INTRAVENOUS

## 2021-08-11 MED ORDER — ESCITALOPRAM OXALATE 10 MG PO TABS
10.0000 mg | ORAL_TABLET | Freq: Every day | ORAL | Status: DC
  Filled 2021-08-11: qty 1

## 2021-08-11 MED ORDER — CHLORHEXIDINE GLUCONATE CLOTH 2 % EX PADS: 6.0000 | MEDICATED_PAD | Freq: Once | CUTANEOUS | Status: DC

## 2021-08-11 MED ORDER — OXYCODONE HCL 5 MG PO TABS: 5.0000 mg | ORAL_TABLET | Freq: Once | ORAL | Status: DC | PRN

## 2021-08-11 MED ORDER — ALUM & MAG HYDROXIDE-SIMETH 200-200-20 MG/5ML PO SUSP: 30.0000 mL | Freq: Four times a day (QID) | ORAL | Status: DC | PRN

## 2021-08-11 MED ORDER — DEXAMETHASONE SODIUM PHOSPHATE 10 MG/ML IJ SOLN
INTRAMUSCULAR | Status: DC | PRN
  Administered 2021-08-11: 5 mg via INTRAVENOUS

## 2021-08-11 MED ORDER — LIDOCAINE 2% (20 MG/ML) 5 ML SYRINGE
INTRAMUSCULAR | Status: AC
  Filled 2021-08-11: qty 5

## 2021-08-11 MED ORDER — HYDROCODONE-ACETAMINOPHEN 5-325 MG PO TABS: 1.0000 | ORAL_TABLET | ORAL | Status: DC | PRN

## 2021-08-11 MED ORDER — CYCLOBENZAPRINE HCL 10 MG PO TABS: 10.0000 mg | ORAL_TABLET | Freq: Three times a day (TID) | ORAL | Status: DC | PRN

## 2021-08-11 MED ORDER — OXYCODONE HCL 5 MG PO TABS
10.0000 mg | ORAL_TABLET | ORAL | Status: DC | PRN
  Administered 2021-08-11 – 2021-08-12 (×4): 10 mg via ORAL
  Filled 2021-08-11 (×4): qty 2

## 2021-08-11 MED ORDER — LIDOCAINE-EPINEPHRINE 1 %-1:100000 IJ SOLN
INTRAMUSCULAR | Status: AC
  Filled 2021-08-11: qty 1

## 2021-08-11 MED ORDER — MIDAZOLAM HCL 2 MG/2ML IJ SOLN
INTRAMUSCULAR | Status: AC
  Filled 2021-08-11: qty 2

## 2021-08-11 MED ORDER — LIDOCAINE-EPINEPHRINE 1 %-1:100000 IJ SOLN
INTRAMUSCULAR | Status: DC | PRN
  Administered 2021-08-11: 8 mL

## 2021-08-11 MED ORDER — INSULIN ASPART 100 UNIT/ML IJ SOLN: 0.0000 [IU] | Freq: Three times a day (TID) | INTRAMUSCULAR | Status: DC

## 2021-08-11 MED ORDER — ALLOPURINOL 100 MG PO TABS
100.0000 mg | ORAL_TABLET | Freq: Every day | ORAL | Status: DC
  Filled 2021-08-11: qty 1

## 2021-08-11 MED ORDER — SUCCINYLCHOLINE CHLORIDE 200 MG/10ML IV SOSY
PREFILLED_SYRINGE | INTRAVENOUS | Status: DC | PRN
  Administered 2021-08-11: 140 mg via INTRAVENOUS

## 2021-08-11 MED ORDER — HYDROMORPHONE HCL 1 MG/ML IJ SOLN
0.2500 mg | INTRAMUSCULAR | Status: DC | PRN
  Administered 2021-08-11 (×2): 0.5 mg via INTRAVENOUS

## 2021-08-11 MED ORDER — LINAGLIPTIN 5 MG PO TABS
5.0000 mg | ORAL_TABLET | Freq: Every day | ORAL | Status: DC
  Administered 2021-08-11: 5 mg via ORAL
  Filled 2021-08-11 (×3): qty 1

## 2021-08-11 MED ORDER — METHOCARBAMOL 500 MG PO TABS
500.0000 mg | ORAL_TABLET | Freq: Four times a day (QID) | ORAL | Status: DC | PRN
  Administered 2021-08-11 – 2021-08-12 (×3): 500 mg via ORAL
  Filled 2021-08-11 (×4): qty 1

## 2021-08-11 MED ORDER — ASPIRIN EC 81 MG PO TBEC
81.0000 mg | DELAYED_RELEASE_TABLET | Freq: Every day | ORAL | Status: DC
  Administered 2021-08-11: 81 mg via ORAL
  Filled 2021-08-11: qty 1

## 2021-08-11 MED ORDER — ONDANSETRON HCL 4 MG PO TABS: 4.0000 mg | ORAL_TABLET | Freq: Four times a day (QID) | ORAL | Status: DC | PRN

## 2021-08-11 MED ORDER — POTASSIUM 99 MG PO TABS: 99.0000 mg | ORAL_TABLET | Freq: Every day | ORAL | Status: DC

## 2021-08-11 MED ORDER — BUPIVACAINE HCL (PF) 0.25 % IJ SOLN
INTRAMUSCULAR | Status: AC
  Filled 2021-08-11: qty 30

## 2021-08-11 MED ORDER — PHENYLEPHRINE 40 MCG/ML (10ML) SYRINGE FOR IV PUSH (FOR BLOOD PRESSURE SUPPORT)
PREFILLED_SYRINGE | INTRAVENOUS | Status: DC | PRN
  Administered 2021-08-11 (×2): 80 ug via INTRAVENOUS
  Administered 2021-08-11: 120 ug via INTRAVENOUS

## 2021-08-11 MED ORDER — LIDOCAINE 2% (20 MG/ML) 5 ML SYRINGE
INTRAMUSCULAR | Status: DC | PRN
  Administered 2021-08-11: 100 mg via INTRAVENOUS

## 2021-08-11 MED ORDER — CEFAZOLIN SODIUM-DEXTROSE 2-4 GM/100ML-% IV SOLN
2.0000 g | Freq: Three times a day (TID) | INTRAVENOUS | Status: AC
  Administered 2021-08-11 – 2021-08-12 (×2): 2 g via INTRAVENOUS
  Filled 2021-08-11 (×2): qty 100

## 2021-08-11 MED ORDER — ONDANSETRON HCL 4 MG/2ML IJ SOLN
INTRAMUSCULAR | Status: AC
  Filled 2021-08-11: qty 2

## 2021-08-11 MED ORDER — CEFAZOLIN SODIUM-DEXTROSE 2-4 GM/100ML-% IV SOLN
2.0000 g | INTRAVENOUS | Status: AC
  Administered 2021-08-11: 2 g via INTRAVENOUS

## 2021-08-11 MED ORDER — ACETAMINOPHEN 650 MG RE SUPP: 650.0000 mg | RECTAL | Status: DC | PRN

## 2021-08-11 MED ORDER — THROMBIN 20000 UNITS EX SOLR: CUTANEOUS | Status: DC | PRN

## 2021-08-11 MED ORDER — BUPIVACAINE HCL (PF) 0.25 % IJ SOLN
INTRAMUSCULAR | Status: DC | PRN
  Administered 2021-08-11: 10 mL

## 2021-08-11 MED ORDER — ASPIRIN EC 325 MG PO TBEC: 650.0000 mg | DELAYED_RELEASE_TABLET | Freq: Four times a day (QID) | ORAL | Status: DC | PRN

## 2021-08-11 MED ORDER — METFORMIN HCL 500 MG PO TABS
1000.0000 mg | ORAL_TABLET | Freq: Two times a day (BID) | ORAL | Status: DC
  Administered 2021-08-11: 1000 mg via ORAL
  Filled 2021-08-11: qty 2

## 2021-08-11 MED ORDER — BACITRACIN ZINC 500 UNIT/GM EX OINT
TOPICAL_OINTMENT | CUTANEOUS | Status: DC | PRN
  Administered 2021-08-11: 1 via TOPICAL

## 2021-08-11 MED ORDER — EPHEDRINE SULFATE 50 MG/ML IJ SOLN
INTRAMUSCULAR | Status: DC | PRN
  Administered 2021-08-11 (×2): 5 mg via INTRAVENOUS

## 2021-08-11 MED ORDER — FENTANYL CITRATE (PF) 250 MCG/5ML IJ SOLN
INTRAMUSCULAR | Status: AC
  Filled 2021-08-11: qty 5

## 2021-08-11 MED ORDER — SODIUM CHLORIDE 0.9 % IV SOLN: 250.0000 mL | INTRAVENOUS | Status: DC

## 2021-08-11 MED ORDER — COLCHICINE 0.6 MG PO TABS: 0.6000 mg | ORAL_TABLET | Freq: Every day | ORAL | Status: DC | PRN

## 2021-08-11 MED ORDER — ACETAMINOPHEN 325 MG PO TABS
650.0000 mg | ORAL_TABLET | ORAL | Status: DC | PRN
  Administered 2021-08-11 – 2021-08-12 (×2): 650 mg via ORAL
  Filled 2021-08-11 (×2): qty 2

## 2021-08-11 MED ORDER — ONDANSETRON HCL 4 MG/2ML IJ SOLN: 4.0000 mg | Freq: Once | INTRAMUSCULAR | Status: DC | PRN

## 2021-08-11 MED ORDER — INDOMETHACIN 25 MG PO CAPS: 50.0000 mg | ORAL_CAPSULE | Freq: Every day | ORAL | Status: DC | PRN

## 2021-08-11 MED ORDER — HYDROCHLOROTHIAZIDE 12.5 MG PO TABS
12.5000 mg | ORAL_TABLET | Freq: Every day | ORAL | Status: DC
  Administered 2021-08-11: 12.5 mg via ORAL
  Filled 2021-08-11: qty 1

## 2021-08-11 MED ORDER — LISINOPRIL-HYDROCHLOROTHIAZIDE 20-12.5 MG PO TABS: 1.0000 | ORAL_TABLET | Freq: Every day | ORAL | Status: DC

## 2021-08-11 MED ORDER — 0.9 % SODIUM CHLORIDE (POUR BTL) OPTIME
TOPICAL | Status: DC | PRN
  Administered 2021-08-11: 1000 mL

## 2021-08-11 MED ORDER — SODIUM CHLORIDE 0.9% FLUSH: 3.0000 mL | INTRAVENOUS | Status: DC | PRN

## 2021-08-11 MED ORDER — INSULIN ASPART 100 UNIT/ML IJ SOLN
0.0000 [IU] | Freq: Every day | INTRAMUSCULAR | Status: DC
  Administered 2021-08-11: 5 [IU] via SUBCUTANEOUS

## 2021-08-11 MED ORDER — LACTATED RINGERS IV SOLN: INTRAVENOUS | Status: DC

## 2021-08-11 MED ORDER — FENTANYL CITRATE (PF) 250 MCG/5ML IJ SOLN
INTRAMUSCULAR | Status: DC | PRN
  Administered 2021-08-11 (×2): 50 ug via INTRAVENOUS
  Administered 2021-08-11: 100 ug via INTRAVENOUS
  Administered 2021-08-11: 50 ug via INTRAVENOUS

## 2021-08-11 MED ORDER — MENTHOL 3 MG MT LOZG: 1.0000 | LOZENGE | OROMUCOSAL | Status: DC | PRN

## 2021-08-11 MED ORDER — PHENYLEPHRINE HCL-NACL 20-0.9 MG/250ML-% IV SOLN
INTRAVENOUS | Status: DC | PRN
  Administered 2021-08-11: 60 ug/min via INTRAVENOUS

## 2021-08-11 MED ORDER — CEFAZOLIN SODIUM-DEXTROSE 2-4 GM/100ML-% IV SOLN
INTRAVENOUS | Status: AC
  Filled 2021-08-11: qty 100

## 2021-08-11 MED ORDER — ONDANSETRON HCL 4 MG/2ML IJ SOLN: 4.0000 mg | Freq: Four times a day (QID) | INTRAMUSCULAR | Status: DC | PRN

## 2021-08-11 MED ORDER — BACITRACIN ZINC 500 UNIT/GM EX OINT
TOPICAL_OINTMENT | CUTANEOUS | Status: AC
  Filled 2021-08-11: qty 28.35

## 2021-08-11 MED ORDER — PHENOL 1.4 % MT LIQD: 1.0000 | OROMUCOSAL | Status: DC | PRN

## 2021-08-11 MED ORDER — SUGAMMADEX SODIUM 200 MG/2ML IV SOLN
INTRAVENOUS | Status: DC | PRN
  Administered 2021-08-11: 200 mg via INTRAVENOUS

## 2021-08-11 MED ORDER — PANTOPRAZOLE SODIUM 40 MG PO TBEC
40.0000 mg | DELAYED_RELEASE_TABLET | Freq: Every day | ORAL | Status: DC
  Administered 2021-08-11: 40 mg via ORAL
  Filled 2021-08-11: qty 1

## 2021-08-11 MED ORDER — HYDROMORPHONE HCL 1 MG/ML IJ SOLN
INTRAMUSCULAR | Status: AC
  Filled 2021-08-11: qty 0.5

## 2021-08-11 MED ORDER — OXYCODONE HCL 5 MG/5ML PO SOLN: 5.0000 mg | Freq: Once | ORAL | Status: DC | PRN

## 2021-08-11 MED ORDER — SIMVASTATIN 20 MG PO TABS: 20.0000 mg | ORAL_TABLET | Freq: Every day | ORAL | Status: DC

## 2021-08-11 MED ORDER — HYDROMORPHONE HCL 1 MG/ML IJ SOLN
INTRAMUSCULAR | Status: AC
  Filled 2021-08-11: qty 1

## 2021-08-11 MED ORDER — DEXAMETHASONE SODIUM PHOSPHATE 10 MG/ML IJ SOLN
INTRAMUSCULAR | Status: AC
  Filled 2021-08-11: qty 1

## 2021-08-11 MED ORDER — HYDROMORPHONE HCL 1 MG/ML IJ SOLN
INTRAMUSCULAR | Status: DC | PRN
  Administered 2021-08-11 (×2): .5 mg via INTRAVENOUS

## 2021-08-11 MED ORDER — METOPROLOL SUCCINATE ER 25 MG PO TB24: 25.0000 mg | ORAL_TABLET | Freq: Every day | ORAL | Status: DC

## 2021-08-11 MED ORDER — FLUTICASONE PROPIONATE 50 MCG/ACT NA SUSP: 2.0000 | Freq: Every day | NASAL | Status: DC

## 2021-08-11 MED ORDER — THROMBIN 20000 UNITS EX SOLR
CUTANEOUS | Status: AC
  Filled 2021-08-11: qty 20000

## 2021-08-11 MED ORDER — PANTOPRAZOLE SODIUM 40 MG IV SOLR: 40.0000 mg | Freq: Every day | INTRAVENOUS | Status: DC

## 2021-08-11 MED ORDER — MIDAZOLAM HCL 2 MG/2ML IJ SOLN
INTRAMUSCULAR | Status: DC | PRN
  Administered 2021-08-11: 2 mg via INTRAVENOUS

## 2021-08-11 MED ORDER — PROPOFOL 10 MG/ML IV BOLUS
INTRAVENOUS | Status: DC | PRN
  Administered 2021-08-11: 160 mg via INTRAVENOUS

## 2021-08-11 MED ORDER — CHLORHEXIDINE GLUCONATE 0.12 % MT SOLN
OROMUCOSAL | Status: AC
  Administered 2021-08-11: 15 mL
  Filled 2021-08-11: qty 15

## 2021-08-11 MED ORDER — ROCURONIUM BROMIDE 10 MG/ML (PF) SYRINGE
PREFILLED_SYRINGE | INTRAVENOUS | Status: AC
  Filled 2021-08-11: qty 10

## 2021-08-11 MED ORDER — INSULIN ASPART 100 UNIT/ML IJ SOLN
0.0000 [IU] | Freq: Three times a day (TID) | INTRAMUSCULAR | Status: DC
  Administered 2021-08-12: 3 [IU] via SUBCUTANEOUS

## 2021-08-11 MED ORDER — ONDANSETRON HCL 4 MG/2ML IJ SOLN
INTRAMUSCULAR | Status: DC | PRN
  Administered 2021-08-11: 4 mg via INTRAVENOUS

## 2021-08-11 SURGICAL SUPPLY — 73 items
ADH SKN CLS APL DERMABOND .7 (GAUZE/BANDAGES/DRESSINGS) ×1
APL SKNCLS STERI-STRIP NONHPOA (GAUZE/BANDAGES/DRESSINGS) ×1
BAG COUNTER SPONGE SURGICOUNT (BAG) ×2 IMPLANT
BAG SPNG CNTER NS LX DISP (BAG) ×1
BAND INSRT 18 STRL LF DISP RB (MISCELLANEOUS)
BAND RUBBER #18 3X1/16 STRL (MISCELLANEOUS) IMPLANT
BENZOIN TINCTURE PRP APPL 2/3 (GAUZE/BANDAGES/DRESSINGS) ×3 IMPLANT
BIT DRILL 3.5 SHORT (BIT) ×1 IMPLANT
BLADE CLIPPER SURG (BLADE) ×2 IMPLANT
BLADE SURG 11 STRL SS (BLADE) ×2 IMPLANT
BONE VIVIGEN FORMABLE 5.4CC (Bone Implant) ×2 IMPLANT
BUR MATCHSTICK NEURO 3.0 LAGG (BURR) ×2 IMPLANT
CANISTER SUCT 3000ML PPV (MISCELLANEOUS) ×2 IMPLANT
CAP LOCKING (Cap) IMPLANT
CARTRIDGE OIL MAESTRO DRILL (MISCELLANEOUS) ×1 IMPLANT
CLSR STERI-STRIP ANTIMIC 1/2X4 (GAUZE/BANDAGES/DRESSINGS) ×1 IMPLANT
DECANTER SPIKE VIAL GLASS SM (MISCELLANEOUS) ×1 IMPLANT
DERMABOND ADVANCED (GAUZE/BANDAGES/DRESSINGS) ×1
DERMABOND ADVANCED .7 DNX12 (GAUZE/BANDAGES/DRESSINGS) ×1 IMPLANT
DIFFUSER DRILL AIR PNEUMATIC (MISCELLANEOUS) ×2 IMPLANT
DRAPE C-ARM 42X72 X-RAY (DRAPES) ×2 IMPLANT
DRAPE LAPAROTOMY 100X72 PEDS (DRAPES) ×2 IMPLANT
DRAPE MICROSCOPE LEICA (MISCELLANEOUS) IMPLANT
DRAPE SURG 17X23 STRL (DRAPES) ×8 IMPLANT
DRSG OPSITE 4X5.5 SM (GAUZE/BANDAGES/DRESSINGS) ×1 IMPLANT
DRSG OPSITE POSTOP 4X6 (GAUZE/BANDAGES/DRESSINGS) ×1 IMPLANT
DURAPREP 26ML APPLICATOR (WOUND CARE) ×2 IMPLANT
ELECT BLADE 4.0 EZ CLEAN MEGAD (MISCELLANEOUS) ×2
ELECT REM PT RETURN 9FT ADLT (ELECTROSURGICAL) ×2
ELECTRODE BLDE 4.0 EZ CLN MEGD (MISCELLANEOUS) IMPLANT
ELECTRODE REM PT RTRN 9FT ADLT (ELECTROSURGICAL) ×1 IMPLANT
EVACUATOR 1/8 PVC DRAIN (DRAIN) ×1 IMPLANT
GAUZE 4X4 16PLY ~~LOC~~+RFID DBL (SPONGE) ×1 IMPLANT
GAUZE SPONGE 4X4 12PLY STRL (GAUZE/BANDAGES/DRESSINGS) ×2 IMPLANT
GLOVE EXAM NITRILE XL STR (GLOVE) IMPLANT
GLOVE SURG ENC MOIS LTX SZ7 (GLOVE) ×2 IMPLANT
GLOVE SURG ENC MOIS LTX SZ8 (GLOVE) ×2 IMPLANT
GLOVE SURG UNDER LTX SZ8.5 (GLOVE) ×2 IMPLANT
GLOVE SURG UNDER POLY LF SZ7 (GLOVE) ×2 IMPLANT
GOWN STRL REUS W/ TWL LRG LVL3 (GOWN DISPOSABLE) IMPLANT
GOWN STRL REUS W/ TWL XL LVL3 (GOWN DISPOSABLE) ×1 IMPLANT
GOWN STRL REUS W/TWL 2XL LVL3 (GOWN DISPOSABLE) IMPLANT
GOWN STRL REUS W/TWL LRG LVL3 (GOWN DISPOSABLE) ×4
GOWN STRL REUS W/TWL XL LVL3 (GOWN DISPOSABLE) ×2
GRAFT BNE MATRIX VG FRMBL MD 5 (Bone Implant) IMPLANT
IMPL QUARTEX 3.5X12MM (Neuro Prosthesis/Implant) IMPLANT
IMPLANT QUARTEX 3.5X12MM (Neuro Prosthesis/Implant) ×12 IMPLANT
KIT BASIN OR (CUSTOM PROCEDURE TRAY) ×2 IMPLANT
KIT TURNOVER KIT B (KITS) ×2 IMPLANT
LOCKING CAP (Cap) ×12 IMPLANT
MARKER SKIN DUAL TIP RULER LAB (MISCELLANEOUS) ×2 IMPLANT
NDL HYPO 25X1 1.5 SAFETY (NEEDLE) ×1 IMPLANT
NDL SPNL 20GX3.5 QUINCKE YW (NEEDLE) ×1 IMPLANT
NEEDLE HYPO 25X1 1.5 SAFETY (NEEDLE) ×2 IMPLANT
NEEDLE SPNL 20GX3.5 QUINCKE YW (NEEDLE) IMPLANT
NS IRRIG 1000ML POUR BTL (IV SOLUTION) ×2 IMPLANT
OIL CARTRIDGE MAESTRO DRILL (MISCELLANEOUS) ×2
PACK LAMINECTOMY NEURO (CUSTOM PROCEDURE TRAY) ×2 IMPLANT
PAD ARMBOARD 7.5X6 YLW CONV (MISCELLANEOUS) ×6 IMPLANT
PIN MAYFIELD SKULL DISP (PIN) ×2 IMPLANT
ROD CURVED CAP 3.5X45 (Rod) ×2 IMPLANT
SPONGE SURGIFOAM ABS GEL 100 (HEMOSTASIS) ×2 IMPLANT
SPONGE T-LAP 4X18 ~~LOC~~+RFID (SPONGE) IMPLANT
STRIP CLOSURE SKIN 1/2X4 (GAUZE/BANDAGES/DRESSINGS) ×2 IMPLANT
SUT ETHILON 4 0 PS 2 18 (SUTURE) IMPLANT
SUT VIC AB 0 CT1 18XCR BRD8 (SUTURE) ×1 IMPLANT
SUT VIC AB 0 CT1 8-18 (SUTURE) ×2
SUT VIC AB 2-0 CT1 18 (SUTURE) ×3 IMPLANT
SUT VIC AB 4-0 PS2 27 (SUTURE) ×2 IMPLANT
TOWEL GREEN STERILE (TOWEL DISPOSABLE) ×2 IMPLANT
TOWEL GREEN STERILE FF (TOWEL DISPOSABLE) ×2 IMPLANT
TRAY FOLEY MTR SLVR 16FR STAT (SET/KITS/TRAYS/PACK) IMPLANT
WATER STERILE IRR 1000ML POUR (IV SOLUTION) ×2 IMPLANT

## 2021-08-11 NOTE — Anesthesia Preprocedure Evaluation (Addendum)
Anesthesia Evaluation  Patient identified by MRN, date of birth, ID band Patient awake    Reviewed: Allergy & Precautions, H&P , NPO status , Patient's Chart, lab work & pertinent test results  Airway Mallampati: II  TM Distance: >3 FB Neck ROM: Limited    Dental no notable dental hx.    Pulmonary neg pulmonary ROS, former smoker,    Pulmonary exam normal breath sounds clear to auscultation       Cardiovascular hypertension, + CAD and + Cardiac Stents  Normal cardiovascular exam Rhythm:Regular Rate:Normal     Neuro/Psych negative neurological ROS  negative psych ROS   GI/Hepatic Neg liver ROS, GERD  ,  Endo/Other  diabetes, Type 2  Renal/GU negative Renal ROS  negative genitourinary   Musculoskeletal negative musculoskeletal ROS (+)   Abdominal   Peds negative pediatric ROS (+)  Hematology negative hematology ROS (+)   Anesthesia Other Findings   Reproductive/Obstetrics negative OB ROS                            Anesthesia Physical Anesthesia Plan  ASA: 3  Anesthesia Plan: General   Post-op Pain Management:    Induction: Intravenous  PONV Risk Score and Plan: 2 and Ondansetron, Dexamethasone and Treatment may vary due to age or medical condition  Airway Management Planned: Oral ETT and Video Laryngoscope Planned  Additional Equipment:   Intra-op Plan:   Post-operative Plan: Extubation in OR  Informed Consent: I have reviewed the patients History and Physical, chart, labs and discussed the procedure including the risks, benefits and alternatives for the proposed anesthesia with the patient or authorized representative who has indicated his/her understanding and acceptance.     Dental advisory given  Plan Discussed with: CRNA and Surgeon  Anesthesia Plan Comments:        Anesthesia Quick Evaluation

## 2021-08-11 NOTE — Anesthesia Procedure Notes (Signed)
Procedure Name: Intubation Date/Time: 08/11/2021 1:13 PM Performed by: Amadeo Garnet, CRNA Pre-anesthesia Checklist: Patient identified, Emergency Drugs available, Suction available and Patient being monitored Patient Re-evaluated:Patient Re-evaluated prior to induction Oxygen Delivery Method: Circle system utilized Preoxygenation: Pre-oxygenation with 100% oxygen Induction Type: IV induction Ventilation: Two handed mask ventilation required and Oral airway inserted - appropriate to patient size Laryngoscope Size: Glidescope, Mac and 4 Grade View: Grade I Tube type: Oral Tube size: 7.5 mm Number of attempts: 1 Airway Equipment and Method: Stylet and Oral airway Placement Confirmation: ETT inserted through vocal cords under direct vision, positive ETCO2 and breath sounds checked- equal and bilateral Secured at: 21 cm Tube secured with: Tape Dental Injury: Teeth and Oropharynx as per pre-operative assessment  Comments: Intubated by Drucie Opitz, Fernanda Drum

## 2021-08-11 NOTE — H&P (Signed)
Devon Wood is an 60 y.o. male.   Chief Complaint: Neck pain shoulder arm pain numbness and weakness of her extremities HPI: 60 year old gentleman who is 2 weeks out from an ACDF who is doing very well until he had a fall early Sunday morning.  Landing on his left side since then has been experiencing increased numbness tingling weakness in his arms and now cold sensation in his feet and swelling feeling in his arms and legs.  Patient was seen in the office yesterday plain films overall showed stable position of hardware emergent MRI scan showed spinal cord injury with signal change within his cord severe stenosis primarily from posterior compression.  There was some blood behind the implants anteriorly but overall his anterior compression was significantly improved from preop.  So due to his spinal cord injury persistent stenosis primarily from posterior osteophytic complex I recommended posterior decompression and augmentation of fusion with lateral mass screws.  I have extensively gone over the risks and benefits of that operation with him as well as perioperative course expectations of outcome and alternatives to surgery and he understands and agrees to proceed forward.  Past Medical History:  Diagnosis Date   Anxiety    Arthritis    Bronchitis    hx of;last time couple of yrs ago   Bronchitis    hx of   CAD (coronary artery disease) 10/10/2013   2004 2.0 x 25 mm drug-eluting cypher stent to LAD    Chronic back pain    L3 buldging disc;DDD   Complication of anesthesia    20-30 second asystole episode during induction  04/2003,  was on b-blocker tx at the time, found to have 80% LAD lesion s/p stent (but unclear if CAD was the direct cause)    Coronary artery disease 2004   stent placement, sees Dr "[facility]" [facility]   Depression    Diabetes mellitus    takes Glipizide and Metformin daily   DM type 2 causing complication (HCC) 10/10/2013   GERD (gastroesophageal reflux  disease)    takes Pepcid daily   Gout    hx of   History of kidney stones    HTN (hypertension) 10/10/2013   Hypercholesteremia    takes Simvastatin daily   Hypertension    takes Lisinopril daily   Joint pain    Mixed hyperlipidemia 10/10/2013   Morbid obesity (HCC) 10/10/2013   Nocturia    Peptic ulcer     Past Surgical History:  Procedure Laterality Date   ANTERIOR CERVICAL DECOMP/DISCECTOMY FUSION N/A 07/28/2021   Procedure: Anterior Cervical Decompression Fusion Cervical four-five,Cervical five-six;  Surgeon: Donalee Citrin, MD;  Location: [facility] OR;  Service: Neurosurgery;  Laterality: N/A;   BACK SURGERY     CARDIAC CATHETERIZATION  11/27/2009   widely patent stent   COLONOSCOPY WITH PROPOFOL N/A 03/17/2015   Procedure: COLONOSCOPY WITH PROPOFOL;  Surgeon: Charolett Bumpers, MD;  Location: [facility];  Service: Endoscopy;  Laterality: N/A;   CORONARY STENT PLACEMENT  05/30/2003   proximal LAD   ESOPHAGOGASTRODUODENOSCOPY  in the 90's   HAND SURGERY  1980   right   LUMBAR DISC SURGERY  2002   L5   LUMBAR FUSION  2004   NM MYOCAR PERF WALL MOTION  05/19/2004   no evidence of ischemia   TONSILLECTOMY  1969   US ECHOCARDIOGRAPHY  04/30/2003   mildly dilated aortic root,mild LA enlargement,mild concentric LVH,trivial mitral and tricuspid regurg.,mild mitral annular ca+  Family History  Problem Relation Age of Onset   COPD Mother    Heart attack Father    COPD Brother    COPD Brother    Dementia Maternal Grandmother    Cancer Maternal Grandfather    Heart failure Paternal Grandmother    Heart attack Paternal Grandfather    Hypertension Sister    Anesthesia problems Neg Hx    Hypotension Neg Hx    Malignant hyperthermia Neg Hx    Pseudochol deficiency Neg Hx    Social History:  reports that he quit smoking about 38 years ago. His smoking use included cigarettes. He has a 2.00 pack-year smoking history. He has quit using smokeless tobacco.  His smokeless tobacco use  included chew. He reports current alcohol use. He reports that he does not use drugs.  Allergies:  Allergies  Allergen Reactions   Erythromycin Rash    Medications Prior to Admission  Medication Sig Dispense Refill   allopurinol (ZYLOPRIM) 100 MG tablet Take 100 mg by mouth daily.      aspirin 81 MG tablet Take 81 mg by mouth daily.     aspirin EC 325 MG tablet Take 650 mg by mouth every 6 (six) hours as needed for moderate pain.     escitalopram (LEXAPRO) 10 MG tablet Take 10 mg by mouth daily.     famotidine (PEPCID) 20 MG tablet Take 20 mg by mouth daily.      fluticasone (FLONASE) 50 MCG/ACT nasal spray Place 2 sprays into both nostrils daily.     HYDROcodone-acetaminophen (NORCO/VICODIN) 5-325 MG tablet Take 1 tablet by mouth every 4 (four) hours as needed for moderate pain. 20 tablet 0   lisinopril-hydrochlorothiazide (PRINZIDE,ZESTORETIC) 20-12.5 MG per tablet Take 1 tablet by mouth daily.     meloxicam (MOBIC) 15 MG tablet Take 15 mg by mouth daily.     metFORMIN (GLUCOPHAGE) 500 MG tablet Take 1,000 mg by mouth 2 (two) times daily with a meal.      methocarbamol (ROBAXIN) 500 MG tablet Take 1 tablet (500 mg total) by mouth 4 (four) times daily. 45 tablet 0   metoprolol succinate (TOPROL XL) 25 MG 24 hr tablet Take 1 tablet (25 mg total) by mouth daily. 90 tablet 3   Potassium 99 MG TABS Take 99 mg by mouth daily.     simvastatin (ZOCOR) 40 MG tablet Take 20 mg by mouth daily.       sitaGLIPtin (JANUVIA) 100 MG tablet Take 50 mg by mouth daily.     colchicine 0.6 MG tablet Take 0.6 mg by mouth daily as needed (For gout flares).     indomethacin (INDOCIN) 50 MG capsule Take 50 mg by mouth daily as needed (gout flare).      Results for orders placed or performed during the hospital encounter of 08/11/21 (from the past 48 hour(s))  Glucose, capillary     Status: Abnormal   Collection Time: 08/11/21 11:48 AM  Result Value Ref Range   Glucose-Capillary 142 (H) 70 - 99 mg/dL     Comment: Glucose reference range applies only to samples taken after fasting for at least 8 hours.   No results found.  Review of Systems  Musculoskeletal:  Positive for neck pain.  Neurological:  Positive for weakness and numbness.   Blood pressure (!) 154/84, pulse (!) 102, temperature 98.4 F (36.9 C), temperature source Oral, resp. rate 20, height 5\' 7"  (1.702 m), weight 99.8 kg, SpO2 97 %. Physical Exam HENT:  Head: Normocephalic.     Right Ear: Tympanic membrane normal.     Nose: Nose normal.     Mouth/Throat:     Mouth: Mucous membranes are moist.  Eyes:     Pupils: Pupils are equal, round, and reactive to light.  Cardiovascular:     Rate and Rhythm: Normal rate.  Pulmonary:     Effort: Pulmonary effort is normal.  Abdominal:     General: Abdomen is flat.  Musculoskeletal:        General: Normal range of motion.  Skin:    General: Skin is warm.  Neurological:     Mental Status: He is alert.     Comments: Patient is awake and alert deficits are 4+ out of 5 grip strength 4-5 right tricep otherwise appears 5-5 upper and lower extremities.     Assessment/Plan 60 year old presents for posterior cervical decompressive laminectomy and fusion  Mariam Dollar, MD 08/11/2021, 12:09 PM

## 2021-08-11 NOTE — Transfer of Care (Signed)
Immediate Anesthesia Transfer of Care Note  Patient: Devon Wood  Procedure(s) Performed: Post Cervical Lami/Multi Level - Cervical four-Cervical five - Cervical five-Cervical six with lateral mass screw fixation (Spine Cervical)  Patient Location: PACU  Anesthesia Type:General  Level of Consciousness: awake, alert  and oriented  Airway & Oxygen Therapy: Patient Spontanous Breathing and Patient connected to face mask oxygen  Post-op Assessment: Report given to RN and Post -op Vital signs reviewed and stable  Post vital signs: Reviewed and stable  Last Vitals:  Vitals Value Taken Time  BP 175/79 08/11/21 1549  Temp 36.2 C 08/11/21 1549  Pulse 107 08/11/21 1601  Resp 22 08/11/21 1602  SpO2 94 % 08/11/21 1601  Vitals shown include unvalidated device data.  Last Pain:  Vitals:   08/11/21 1549  TempSrc:   PainSc: Asleep      Patients Stated Pain Goal: 2 (08/11/21 1148)  Complications: No notable events documented.

## 2021-08-11 NOTE — Op Note (Signed)
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  Lateral mass fixation C4-5 C5-6 utilizing the globus lateral mass screw system  3.  Posterior lateral arthrodesis C4-5 C5-6 utilizing locally harvested autograft mixed with vivigen  Surgeon: Jillyn Hidden Voris Tigert  Assistant: Julien Girt  Anesthesia: General  EBL: Minimal  HPI: 60 year old gentleman per 2 weeks ago underwent an ACDF and was doing very well but had a fall at home landing on the left side of his shoulder neck and back immediately*experiencing increased numbness tingling weakness in his arms and hands patient showed up in the office plain films were unremarkable MRI scan showed cord contusion and severe stenosis primarily from posterior osteophytes there was a small amount of epidural hematoma behind the implant however the anterior compression was much improved from preop.  So I recommended posterior decompression and fusion.  I extensively went over the risks and benefits of that operation with him as well as perioperative course expectations of outcome and alternatives to surgery and he understood and agreed to proceed forward.  Operative procedure: Patient was brought in the-year-old male used under general anesthesia and positioned prone in pins and neck in slight flexion backside of his neck was shaved prepped and draped in routine sterile fashion.  After infiltration of 10 cc lidocaine with epi a midline incision was made and Bovie electrocautery was used take down the subcutaneous tissue and subperiosteal dissection was carried out on the lamina of C4, C5, C6 and C7.  Intraoperative x-ray confirmed identification appropriate level.  So then the spinous process at C4 and C5 were removed central  decompression was begun there was a large amount of compression at the C4-5 interspace there was also an extensive mount of inflammatory tissue from chronic compression immediately identified and this was freed up relieving a stricture causing severe cord compression.  After this was alleviated with complete laminectomy of C4 and C5 and removing the superior aspect of C6 foraminotomies were performed of C4-5 and C6 and then tension was taken the lateral mass screw placement utilizing inferomedial entry point projecting the superolateral all 6 lateral mass screws were placed all screws with excellent purchase.  Postop imaging confirmed good position although was limited with the patient's anatomy however we did confirm placement level.  Then the wound was copiously irrigated to Kassim states was maintained aggressive decortication was carried out along the TPs and in the facet joints at C4-5 and C5-6 I packed extensive and of autograft mix in the facet joints and along the lateral masses contoured to rods anchored everything in place placed a medium Hemovac drain and injected Marcaine in the fascia I then closed the wound in layers with opted Vicryl and a running 4 subcuticular Dermabond benzoin Steri-Strips and a sterile dressing was applied and patient went to cover him in stable condition.  At the end the case all needle counts and sponge counts were correct

## 2021-08-11 NOTE — Anesthesia Postprocedure Evaluation (Signed)
Anesthesia Post Note  Patient: Caylen Yardley Tippetts  Procedure(s) Performed: Post Cervical Lami/Multi Level - Cervical four-Cervical five - Cervical five-Cervical six with lateral mass screw fixation (Spine Cervical)     Patient location during evaluation: PACU Anesthesia Type: General Level of consciousness: awake and alert Pain management: pain level controlled Vital Signs Assessment: post-procedure vital signs reviewed and stable Respiratory status: spontaneous breathing, nonlabored ventilation, respiratory function stable and patient connected to nasal cannula oxygen Cardiovascular status: blood pressure returned to baseline and stable Postop Assessment: no apparent nausea or vomiting Anesthetic complications: no   No notable events documented.  Last Vitals:  Vitals:   08/11/21 1603 08/11/21 1618  BP: (!) 165/91 (!) 160/90  Pulse: (!) 106 (!) 101  Resp: 19 15  Temp:    SpO2: 94% 93%    Last Pain:  Vitals:   08/11/21 1618  TempSrc:   PainSc: 0-No pain    LLE Motor Response: Purposeful movement (08/11/21 1618) LLE Sensation: Full sensation (08/11/21 1618) RLE Motor Response: Purposeful movement (08/11/21 1618) RLE Sensation: Full sensation (08/11/21 1618)      Emmanuelle Coxe S

## 2021-08-12 ENCOUNTER — Encounter (HOSPITAL_COMMUNITY): Payer: Self-pay | Admitting: Neurosurgery

## 2021-08-12 LAB — HEMOGLOBIN A1C
Hgb A1c MFr Bld: 6.9 % — ABNORMAL HIGH (ref 4.8–5.6)
Mean Plasma Glucose: 151.33 mg/dL

## 2021-08-12 LAB — GLUCOSE, CAPILLARY: Glucose-Capillary: 175 mg/dL — ABNORMAL HIGH (ref 70–99)

## 2021-08-12 MED ORDER — METHOCARBAMOL 500 MG PO TABS: 500.0000 mg | ORAL_TABLET | Freq: Four times a day (QID) | ORAL | 0 refills | Status: DC

## 2021-08-12 MED ORDER — OXYCODONE-ACETAMINOPHEN 5-325 MG PO TABS: 1.0000 | ORAL_TABLET | ORAL | 0 refills | Status: AC | PRN

## 2021-08-12 NOTE — Progress Notes (Signed)
Patient awaiting for transport via wheelchair by volunteer for discharge home; in no acute distress nor complaints of pain nor discomfort; incision on his posterior neck with honeycomb dressing and is clean, dry and intact with Aspen collar on; room was checked and accounted for all his belongings; discharge instructions concerning his medications, incision care, follow up appointment and when to call the doctor as needed were all discussed with patient by RN and he expressed understanding on the instructions given.

## 2021-08-12 NOTE — Discharge Instructions (Signed)
Wound Care  Keep the incision clean and dry remove the outer dressing in 2 days, leave the Steri-Strips intact. Wrap with Saran wrap for showers only Do not put any creams, lotions, or ointments on incision. Leave steri-strips on neck.  They will fall off by themselves.  Activity Walk each and every day, increasing distance each day. No lifting greater than 5 lbs.  Avoid excessive neck motion. No lifting no bending no twisting no driving or riding a car unless coming back and forth to see me. Wear neck brace at all times except when showering.   Diet Resume your normal diet.   Call Your Doctor If Any of These Occur Redness, drainage, or swelling at the wound.  Temperature greater than 101 degrees. Severe pain not relieved by pain medication. Incision starts to come apart.  Follow Up Appt Call today for appointment in 1-2 weeks ([phone]) or for problems.  If you have any hardware placed in your spine, you will need an x-ray before your appointment.   

## 2021-08-12 NOTE — Evaluation (Signed)
Occupational Therapy Evaluation Patient Details Name: Devon Wood MRN: [id number] DOB: 1961-07-08 Today's Date: 08/12/2021   History of Present Illness Pt is a 60 y/o male who was recently admitted for elective C4-C6 ACDF on 07/28/21 who presents back s/p fall at home. Pt now s/p C4-C6 posterior cervical fusion on 08/11/2021. PMH significant for CAD with stenting, DM, Gout, HTN, prior lumbar fusions.   Clinical Impression   Pt admitted for procedure listed above. PTA pt reported that his wife was assisting minimally with all ADL's and IADL's due to limited ROM, BUE numbness, and weakness. At this time, pt appears near his baseline, completing most ADL's independently, and needing min A for UB dressing and buttoning of his pants. Pt educated on compensatory strategies for all basic ADL's. Pt has no further OT needs at this time and acute OT will sign off.      Recommendations for follow up therapy are one component of a multi-disciplinary discharge planning process, led by the attending physician.  Recommendations may be updated based on patient status, additional functional criteria and insurance authorization.   Follow Up Recommendations  No OT follow up    Assistance Recommended at Discharge Set up Supervision/Assistance  Functional Status Assessment  Patient has had a recent decline in their functional status and demonstrates the ability to make significant improvements in function in a reasonable and predictable amount of time.  Equipment Recommendations  None recommended by OT    Recommendations for Other Services       Precautions / Restrictions Precautions Precautions: Cervical Precaution Booklet Issued: Yes (comment) Precaution Comments: reviewed all precautions and compensatory strategies for basic ADL's Required Braces or Orthoses: Cervical Brace Cervical Brace: Soft collar;For comfort Restrictions Weight Bearing Restrictions: No      Mobility Bed Mobility                General bed mobility comments: Pt was received sitting up in the recliner.    Transfers Overall transfer level: Needs assistance Equipment used: None Transfers: Sit to/from Stand Sit to Stand: Supervision           General transfer comment: No assist required to power up to full stand. Increased time taken, and pt appears guarded.      Balance Overall balance assessment: Mild deficits observed, not formally tested                                         ADL either performed or assessed with clinical judgement   ADL Overall ADL's : At baseline                                       General ADL Comments: Pt able to complete all ADL's at baseline level. Due to limited ROM of BUE, pt requires min A for UB dressing and due to numbness in his finger tips, pt needed min A to button his pants. Attempted to educated pt on compensatory strategies for ADL's, pt reporting that he can't do things like that he has his wife help him with all ADL's.     Vision Baseline Vision/History: 0 No visual deficits Ability to See in Adequate Light: 0 Adequate Patient Visual Report: No change from baseline Vision Assessment?: No apparent visual deficits     Perception  Praxis      Pertinent Vitals/Pain Pain Assessment: 0-10 Pain Score: 5  Faces Pain Scale: Hurts a little bit Pain Location: surgical site Pain Descriptors / Indicators: Discomfort Pain Intervention(s): Limited activity within patient's tolerance;Monitored during session;Repositioned     Hand Dominance Right   Extremity/Trunk Assessment Upper Extremity Assessment Upper Extremity Assessment: Defer to OT evaluation (Pt reports bilateral N/T in the UE's) RUE Deficits / Details: Pt unable to raise his RUE over 90 degrees, finger tips are numb. RUE Sensation: decreased light touch RUE Coordination: decreased gross motor;decreased fine motor LUE Deficits / Details: Pt unable to raise  his arms over 80 degrees, finger tips are numb LUE Sensation: decreased light touch LUE Coordination: decreased fine motor;decreased gross motor   Lower Extremity Assessment Lower Extremity Assessment: Generalized weakness   Cervical / Trunk Assessment Cervical / Trunk Assessment: Neck Surgery   Communication Communication Communication: No difficulties   Cognition Arousal/Alertness: Awake/alert Behavior During Therapy: WFL for tasks assessed/performed Overall Cognitive Status: Within Functional Limits for tasks assessed                                       General Comments  Pt provded education on compensatory strategies to assist him in following his cervical precautions during ADL's.    Exercises     Shoulder Instructions      Home Living Family/patient expects to be discharged to:: Private residence Living Arrangements: Spouse/significant other Available Help at Discharge: Family;Available 24 hours/day Type of Home: House Home Access: Ramped entrance     Home Layout: One level     Bathroom Shower/Tub: Producer, television/film/video: Handicapped height Bathroom Accessibility: Yes How Accessible: Accessible via walker Home Equipment: Cane - single point          Prior Functioning/Environment Prior Level of Function : Independent/Modified Independent             Mobility Comments: Pt reports he did not require the RW at home and was ambulating out to the mailbox and back. This is also where he fell at home prior to this admission. ADLs Comments: Pt reports wife washes lower legs and feet        OT Problem List: Decreased strength;Decreased range of motion;Decreased activity tolerance;Impaired balance (sitting and/or standing);Impaired UE functional use;Pain      OT Treatment/Interventions:      OT Goals(Current goals can be found in the care plan section) Acute Rehab OT Goals Patient Stated Goal: To go home OT Goal Formulation:  All assessment and education complete, DC therapy Time For Goal Achievement: 08/12/21 Potential to Achieve Goals: Good  OT Frequency:     Barriers to D/C:            Co-evaluation              AM-PAC OT "6 Clicks" Daily Activity     Outcome Measure Help from another person eating meals?: A Little Help from another person taking care of personal grooming?: A Little Help from another person toileting, which includes using toliet, bedpan, or urinal?: A Little Help from another person bathing (including washing, rinsing, drying)?: A Little Help from another person to put on and taking off regular upper body clothing?: A Little Help from another person to put on and taking off regular lower body clothing?: A Little 6 Click Score: 18   End of Session Nurse Communication: Mobility  status  Activity Tolerance: Patient tolerated treatment well Patient left: in chair;with call bell/phone within reach  OT Visit Diagnosis: Unsteadiness on feet (R26.81);Muscle weakness (generalized) (M62.81)                Time: 3817-7116 OT Time Calculation (min): 18 min Charges:  OT General Charges $OT Visit: 1 Visit OT Evaluation $OT Eval Low Complexity: 1 Low  Jaylyn Iyer H., OTR/L Acute Rehabilitation  Lajuana Patchell Elane Bing Plume 08/12/2021, 10:33 AM

## 2021-08-12 NOTE — Discharge Summary (Signed)
Physician Discharge Summary  Patient ID: Devon Wood MRN: [id number] DOB/AGE: 1961/01/22 60 y.o.  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 The patient tolerated the procedure well and was taken to the recovery room and then to the floor in stable condition. The hospital course was routine. There were no complications. The wound remained clean dry and intact. Pt had appropriate neck soreness. No complaints of arm pain or new N/T/W. The patient remained afebrile with stable vital signs, and tolerated a regular diet. The patient continued to increase activities, and pain was well controlled with oral pain medications.   Consults: None  Significant Diagnostic Studies:  Results for orders placed or performed during the hospital encounter of 08/11/21  SARS Coronavirus 2 by RT PCR (hospital order, performed in [facility] hospital lab) Nasopharyngeal Nasopharyngeal Swab   Specimen: Nasopharyngeal Swab  Result Value Ref Range   SARS Coronavirus 2 NEGATIVE NEGATIVE  Glucose, capillary  Result Value Ref Range   Glucose-Capillary 142 (H) 70 - 99 mg/dL  Glucose, capillary  Result Value Ref Range   Glucose-Capillary 168 (H) 70 - 99 mg/dL  Hemoglobin F3L  Result Value Ref Range   Hgb A1c MFr Bld 6.9 (H) 4.8 - 5.6 %   Mean Plasma Glucose 151.33 mg/dL  Glucose, capillary  Result Value Ref Range   Glucose-Capillary 221 (H) 70 - 99 mg/dL   Comment 1 Notify RN    Comment 2 Document in Chart   Glucose, capillary  Result Value Ref Range   Glucose-Capillary 325 (H) 70 - 99 mg/dL   Comment 1 Notify RN    Comment 2 Document in Chart    Glucose, capillary  Result Value Ref Range   Glucose-Capillary 175 (H) 70 - 99 mg/dL   Comment 1 Notify RN    Comment 2 Document in Chart     DG Cervical Spine 1 View  Result Date: 07/28/2021 CLINICAL DATA:  C4-6 ACDF EXAM: DG CERVICAL SPINE - 1 VIEW COMPARISON:  02/16/2021 FINDINGS: Three fluoroscopic images are obtained during the performance of the procedure and are provided for interpretation only. Evaluation is limited due to overlying structures. Postsurgical changes are seen from anterior plate and screw fixation from a C4-6 ACDF. Alignment appears anatomic. Endotracheal tube is identified. FLUOROSCOPY TIME:  9 seconds IMPRESSION: 1. C4-6 ACDF. Electronically Signed   By: Sharlet Salina M.D.   On: 07/28/2021 15:15   DG Cervical Spine 2 or 3 views  Result Date: 08/11/2021 CLINICAL DATA:  C4-5, C5-6 fusion. EXAM: CERVICAL SPINE - 2-3 VIEW COMPARISON:  Cervical spine x-ray 07/28/2021 FINDINGS: Intraoperative cervical spine. 3 low resolution intraoperative spot views of the cervical spine were obtained. C4-6 anterior fusion hardware, not well seen. Total fluoroscopy time: 17 seconds Total radiation dose: 5.22 micro Gy IMPRESSION: Intraoperative C4-C6 anterior fusion. Electronically Signed   By: Darliss Cheney M.D.   On: 08/11/2021 15:54   DG C-Arm 1-60 Min-No Report  Result Date: 08/11/2021 Fluoroscopy was utilized by the requesting physician.  No radiographic interpretation.   DG C-Arm 1-60 Min-No Report  Result Date: 07/28/2021 Fluoroscopy was utilized by the requesting physician.  No radiographic interpretation.   DG C-Arm 1-60 Min-No Report  Result Date:  07/28/2021 Fluoroscopy was utilized by the requesting physician.  No radiographic interpretation.   ECHOCARDIOGRAM COMPLETE  Result Date: 07/22/2021    ECHOCARDIOGRAM REPORT   Patient Name:   Devon Wood Date of Exam: 07/22/2021 Medical Rec #:  [id number]     Height:       66.0 in Accession #:    [id number]    Weight:        227.0 lb Date of Birth:  1961-08-14     BSA:          2.110 m Patient Age:    59 years      BP:           140/96 mmHg Patient Gender: M             HR:           98 bpm. Exam Location:  [place] Procedure: 2D Echo, Cardiac Doppler, Color Doppler and Intracardiac            Opacification Agent Indications:    I25.110 Atherosclerotic heart disease of native coronary artery                 with unstable angina pectoris  History:        Patient has prior history of Echocardiogram examinations, most                 recent 04/30/2003. CAD; Risk Factors:Hypertension, Diabetes and                 Dyslipidemia.  Sonographer:    San Jetty RDCS, RVT Sonographer#2:  Dondra Prader RVT Referring Phys: [id number] MICHELE M LENZE IMPRESSIONS  1. Left ventricular ejection fraction, by estimation, is 55%. The left ventricle has normal function. The left ventricle has no regional wall motion abnormalities. Left ventricular diastolic parameters are consistent with Grade I diastolic dysfunction (impaired relaxation).  2. Right ventricular systolic function is normal. The right ventricular size is normal.  3. The mitral valve is normal in structure. No evidence of mitral valve regurgitation.  4. The aortic valve is grossly normal. Aortic valve regurgitation is not visualized.  5. Aortic dilatation noted. There is mild dilatation of the ascending aorta, measuring 40 mm.  6. The inferior vena cava is normal in size with greater than 50% respiratory variability, suggesting right atrial pressure of 3 mmHg. Comparison(s): LVEF 55%, Aorta 4.3 cm. FINDINGS  Left Ventricle: Left ventricular ejection fraction, by estimation, is 55%. The left ventricle has normal function. The left ventricle has no regional wall motion abnormalities. Definity contrast agent was given IV to delineate the left ventricular endocardial borders. The left ventricular internal cavity size was normal in size. There is no left ventricular hypertrophy. Left ventricular  diastolic parameters are consistent with Grade I diastolic dysfunction (impaired relaxation). Right Ventricle: The right ventricular size is normal. No increase in right ventricular wall thickness. Right ventricular systolic function is normal. Left Atrium: Left atrial size was normal in size. Right Atrium: Right atrial size was normal in size. Pericardium: There is no evidence of pericardial effusion. Mitral Valve: The mitral valve is normal in structure. No evidence of mitral valve regurgitation. Tricuspid Valve: The tricuspid valve is normal in structure. Tricuspid valve regurgitation is not demonstrated. Aortic Valve: The aortic valve is grossly normal. Aortic valve regurgitation is not visualized. Aortic valve mean gradient measures 4.0 mmHg. Aortic valve peak gradient measures 7.3 mmHg. Aortic valve area, by VTI measures 3.73 cm. Pulmonic Valve: The pulmonic valve was  not well visualized. Pulmonic valve regurgitation is not visualized. Aorta: Aortic dilatation noted. There is mild dilatation of the ascending aorta, measuring 40 mm. Venous: The inferior vena cava is normal in size with greater than 50% respiratory variability, suggesting right atrial pressure of 3 mmHg. IAS/Shunts: No atrial level shunt detected by color flow Doppler.  LEFT VENTRICLE PLAX 2D LVIDd:         4.70 cm   Diastology LVIDs:         3.20 cm   LV e' medial:    5.77 cm/s LV PW:         1.10 cm   LV E/e' medial:  15.2 LV IVS:        0.90 cm   LV e' lateral:   9.14 cm/s LVOT diam:     2.40 cm   LV E/e' lateral: 9.6 LV SV:         100 LV SV Index:   47 LVOT Area:     4.52 cm  RIGHT VENTRICLE             IVC RV S prime:     12.30 cm/s  IVC diam: 1.50 cm TAPSE (M-mode): 2.1 cm LEFT ATRIUM           Index LA Vol (A4C): 40.4 ml 19.14 ml/m  AORTIC VALVE                    PULMONIC VALVE AV Area (Vmax):    3.45 cm     PV Vmax:       0.94 m/s AV Area (Vmean):   3.34 cm     PV Peak grad:  3.5 mmHg AV Area (VTI):     3.73 cm AV Vmax:            135.00 cm/s AV Vmean:          92.300 cm/s AV VTI:            0.267 m AV Peak Grad:      7.3 mmHg AV Mean Grad:      4.0 mmHg LVOT Vmax:         103.00 cm/s LVOT Vmean:        68.100 cm/s LVOT VTI:          0.220 m LVOT/AV VTI ratio: 0.82  AORTA Ao Root diam: 3.80 cm Ao Asc diam:  4.00 cm Ao Arch diam: 2.8 cm MITRAL VALVE                TRICUSPID VALVE MV Area (PHT): 5.93 cm     TR Peak grad:   10.1 mmHg MV Decel Time: 128 msec     TR Vmax:        159.00 cm/s MV E velocity: 87.80 cm/s MV A velocity: 126.00 cm/s  SHUNTS MV E/A ratio:  0.70         Systemic VTI:  0.22 m                             Systemic Diam: 2.40 cm Debbe Odea MD Electronically signed by Debbe Odea MD Signature Date/Time: 07/22/2021/6:34:54 PM    Final     Antibiotics:  Anti-infectives (From admission, onward)    Start     Dose/Rate Route Frequency Ordered Stop   08/11/21 2100  ceFAZolin (ANCEF) IVPB 2g/100 mL premix        2 g 200 mL/hr  over 30 Minutes Intravenous Every 8 hours 08/11/21 1701 08/12/21 0526   08/11/21 1146  ceFAZolin (ANCEF) 2-4 GM/100ML-% IVPB       Note to Pharmacy: Wilburn Mylar J: cabinet override      08/11/21 1146 08/11/21 1314   08/11/21 1145  ceFAZolin (ANCEF) IVPB 2g/100 mL premix        2 g 200 mL/hr over 30 Minutes Intravenous On call to O.R. 08/11/21 1141 08/11/21 1329       Discharge Exam: Blood pressure (!) 152/83, pulse (!) 106, temperature 99.1 F (37.3 C), temperature source Oral, resp. rate 18, height 5\' 7"  (1.702 m), weight 99.8 kg, SpO2 97 %. Neurologic: Grossly normal Ambulating and voiding well, incision cdi   Discharge Medications:   Allergies as of 08/12/2021       Reactions   Erythromycin Rash        Medication List     TAKE these medications    allopurinol 100 MG tablet Commonly known as: ZYLOPRIM Take 100 mg by mouth daily.   aspirin 81 MG tablet Take 81 mg by mouth daily.   colchicine 0.6 MG tablet Take 0.6 mg by mouth daily as needed (For gout  flares).   escitalopram 10 MG tablet Commonly known as: LEXAPRO Take 10 mg by mouth daily.   famotidine 20 MG tablet Commonly known as: PEPCID Take 20 mg by mouth daily.   fluticasone 50 MCG/ACT nasal spray Commonly known as: FLONASE Place 2 sprays into both nostrils daily as needed for allergies.   HYDROcodone-acetaminophen 5-325 MG tablet Commonly known as: NORCO/VICODIN Take 1 tablet by mouth every 4 (four) hours as needed for moderate pain.   indomethacin 50 MG capsule Commonly known as: INDOCIN Take 50 mg by mouth daily as needed (gout flare).   lisinopril-hydrochlorothiazide 20-12.5 MG tablet Commonly known as: ZESTORETIC Take 1 tablet by mouth daily.   meloxicam 15 MG tablet Commonly known as: MOBIC Take 15 mg by mouth daily.   metFORMIN 500 MG tablet Commonly known as: GLUCOPHAGE Take 1,000 mg by mouth 2 (two) times daily with a meal.   methocarbamol 500 MG tablet Commonly known as: Robaxin Take 1 tablet (500 mg total) by mouth 4 (four) times daily.   methylPREDNISolone 4 MG Tbpk tablet Commonly known as: MEDROL DOSEPAK Take 4-24 mg by mouth as directed.   metoprolol succinate 25 MG 24 hr tablet Commonly known as: Toprol XL Take 1 tablet (25 mg total) by mouth daily.   oxyCODONE-acetaminophen 5-325 MG tablet Commonly known as: Percocet Take 1 tablet by mouth every 4 (four) hours as needed for severe pain.   Potassium 99 MG Tabs Take 99 mg by mouth daily.   simvastatin 40 MG tablet Commonly known as: ZOCOR Take 20 mg by mouth daily.   sitaGLIPtin 100 MG tablet Commonly known as: JANUVIA Take 50 mg by mouth daily.        Disposition: home   Final Dx: posterior cervical decompression and fusion C4-C6  Discharge Instructions      Remove dressing in 72 hours   Complete by: As directed    Call MD for:  difficulty breathing, headache or visual disturbances   Complete by: As directed    Call MD for:  hives   Complete by: As directed    Call  MD for:  persistant dizziness or light-headedness   Complete by: As directed    Call MD for:  persistant nausea and vomiting   Complete by: As directed  Call MD for:  redness, tenderness, or signs of infection (pain, swelling, redness, odor or green/yellow discharge around incision site)   Complete by: As directed    Call MD for:  severe uncontrolled pain   Complete by: As directed    Call MD for:  temperature >100.4   Complete by: As directed    Diet - low sodium heart healthy   Complete by: As directed    Increase activity slowly   Complete by: As directed           Signed: Tiana Loft Camilah Spillman 08/12/2021, 7:58 AM

## 2021-08-12 NOTE — Plan of Care (Signed)

## 2021-08-12 NOTE — Evaluation (Signed)
Physical Therapy Evaluation Patient Details Name: Devon Wood MRN: [id number] DOB: [birth year] Today's Date: 08/12/2021  History of Present Illness  Pt is a 60 y/o male who was recently admitted for elective C4-C6 ACDF on 07/28/21 who presents back s/p fall at home. Pt now s/p C4-C6 posterior cervical fusion on 08/11/2021. PMH significant for CAD with stenting, DM, Gout, HTN, prior lumbar fusions.   Clinical Impression  Pt admitted with above diagnosis. At the time of PT eval, pt was able to demonstrate transfers and ambulation with gross supervision for safety to min guard assist and no AD. Pt was educated on precautions, brace application/wearing schedule, appropriate activity progression, and car transfer. Pt currently with functional limitations due to the deficits listed below (see PT Problem List). Pt will benefit from skilled PT to increase their independence and safety with mobility to allow discharge to the venue listed below.         Recommendations for follow up therapy are one component of a multi-disciplinary discharge planning process, led by the attending physician.  Recommendations may be updated based on patient status, additional functional criteria and insurance authorization.  Follow Up Recommendations No PT follow up    Assistance Recommended at Discharge PRN  Functional Status Assessment Patient has had a recent decline in their functional status and demonstrates the ability to make significant improvements in function in a reasonable and predictable amount of time.  Equipment Recommendations  None recommended by PT    Recommendations for Other Services       Precautions / Restrictions Precautions Precautions: Cervical Precaution Booklet Issued: Yes (comment) Precaution Comments: reviewed all precautions and compensatory strategies for basic ADL's Required Braces or Orthoses: Cervical Brace Cervical Brace: Soft collar;For comfort Restrictions Weight Bearing  Restrictions: No      Mobility  Bed Mobility               General bed mobility comments: Pt was received sitting up in the recliner.    Transfers Overall transfer level: Needs assistance Equipment used: None Transfers: Sit to/from Stand Sit to Stand: Supervision           General transfer comment: No assist required to power up to full stand. Increased time taken, and pt appears guarded.    Ambulation/Gait Ambulation/Gait assistance: Min guard Gait Distance (Feet): 300 Feet Assistive device: None Gait Pattern/deviations: Step-through pattern;Antalgic;Trunk flexed Gait velocity: Decreased Gait velocity interpretation: <1.31 ft/sec, indicative of household ambulator   General Gait Details: Significant forward head posture with rounded shoulders in standing, and pt with difficulty improving posture with cues. Very guarded and shaky initially with movement, however improved with distance.  Stairs            Wheelchair Mobility    Modified Rankin (Stroke Patients Only)       Balance Overall balance assessment: Mild deficits observed, not formally tested                                           Pertinent Vitals/Pain Pain Assessment: 0-10 Pain Score: 5  Pain Intervention(s): Limited activity within patient's tolerance;Monitored during session;Repositioned    Home Living Family/patient expects to be discharged to:: Private residence Living Arrangements: Spouse/significant other Available Help at Discharge: Family;Available 24 hours/day Type of Home: House Home Access: Ramped entrance       Home Layout: One level Home Equipment: Cane - single point  Prior Function Prior Level of Function : Independent/Modified Independent             Mobility Comments: Pt reports he did not require the RW at home and was ambulating out to the mailbox and back. This is also where he fell at home prior to this admission. ADLs Comments:  Pt reports wife washes lower legs and feet     Hand Dominance   Dominant Hand: Right    Extremity/Trunk Assessment   Upper Extremity Assessment Upper Extremity Assessment: Defer to OT evaluation (Pt reports bilateral N/T in the UE's)    Lower Extremity Assessment Lower Extremity Assessment: Generalized weakness    Cervical / Trunk Assessment Cervical / Trunk Assessment: Neck Surgery  Communication   Communication: No difficulties  Cognition Arousal/Alertness: Awake/alert Behavior During Therapy: WFL for tasks assessed/performed Overall Cognitive Status: Within Functional Limits for tasks assessed                                          General Comments      Exercises     Assessment/Plan    PT Assessment Patient needs continued PT services  PT Problem List Decreased strength;Decreased activity tolerance;Decreased balance;Decreased mobility;Decreased knowledge of use of DME;Decreased safety awareness;Decreased knowledge of precautions;Pain       PT Treatment Interventions DME instruction;Gait training;Functional mobility training;Therapeutic activities;Therapeutic exercise;Neuromuscular re-education;Balance training;Patient/family education    PT Goals (Current goals can be found in the Care Plan section)  Acute Rehab PT Goals Patient Stated Goal: home today PT Goal Formulation: With patient Time For Goal Achievement: 08/19/21 Potential to Achieve Goals: Good    Frequency Min 5X/week   Barriers to discharge        Co-evaluation               AM-PAC PT "6 Clicks" Mobility  Outcome Measure Help needed turning from your back to your side while in a flat bed without using bedrails?: None Help needed moving from lying on your back to sitting on the side of a flat bed without using bedrails?: A Little Help needed moving to and from a bed to a chair (including a wheelchair)?: A Little Help needed standing up from a chair using your arms  (e.g., wheelchair or bedside chair)?: A Little Help needed to walk in hospital room?: A Little Help needed climbing 3-5 steps with a railing? : A Little 6 Click Score: 19    End of Session Equipment Utilized During Treatment: Gait belt Activity Tolerance: Patient tolerated treatment well Patient left: in bed;with call bell/phone within reach;Other (comment) (sitting EOB) Nurse Communication: Mobility status PT Visit Diagnosis: Other abnormalities of gait and mobility (R26.89);Pain Pain - part of body:  (neck)    Time: 3710-6269 PT Time Calculation (min) (ACUTE ONLY): 12 min   Charges:   PT Evaluation $PT Eval Low Complexity: 1 Low          Conni Slipper, PT, DPT Acute Rehabilitation Services Pager: [phone] Office: [phone]   Marylynn Pearson 08/12/2021, 10:25 AM

## 2021-08-24 ENCOUNTER — Telehealth: Payer: Self-pay

## 2021-08-24 DIAGNOSIS — Z01812 Encounter for preprocedural laboratory examination: Secondary | ICD-10-CM

## 2021-08-24 DIAGNOSIS — I2510 Atherosclerotic heart disease of native coronary artery without angina pectoris: Secondary | ICD-10-CM

## 2021-08-24 NOTE — Telephone Encounter (Signed)
-----   Message from Fabiola Backer, [place] sent at 08/24/2021  9:08 AM EST ----- Regarding: BMET NEEDED Can you please place an order for a BMET for this patient?

## 2021-08-24 NOTE — Telephone Encounter (Signed)
Placed order for BMET for patient's CT. Left message for patient to call back, so we can schedule lab appointment before his CT on 09/01/21.

## 2021-08-24 NOTE — Telephone Encounter (Signed)
Patient has lab appointment on 08/27/21 for BMET.

## 2021-08-27 ENCOUNTER — Other Ambulatory Visit: Payer: Self-pay

## 2021-08-27 ENCOUNTER — Other Ambulatory Visit: Payer: BC Managed Care – PPO | Admitting: *Deleted

## 2021-08-27 DIAGNOSIS — Z01812 Encounter for preprocedural laboratory examination: Secondary | ICD-10-CM

## 2021-08-27 DIAGNOSIS — I2510 Atherosclerotic heart disease of native coronary artery without angina pectoris: Secondary | ICD-10-CM

## 2021-08-27 LAB — BASIC METABOLIC PANEL
BUN/Creatinine Ratio: 18 (ref 9–20)
BUN: 21 mg/dL (ref 6–24)
CO2: 23 mmol/L (ref 20–29)
Calcium: 9.1 mg/dL (ref 8.7–10.2)
Chloride: 96 mmol/L (ref 96–106)
Creatinine, Ser: 1.16 mg/dL (ref 0.76–1.27)
Glucose: 157 mg/dL — ABNORMAL HIGH (ref 70–99)
Potassium: 4.7 mmol/L (ref 3.5–5.2)
Sodium: 134 mmol/L (ref 134–144)
eGFR: 73 mL/min/{1.73_m2} (ref >59)

## 2021-09-01 ENCOUNTER — Ambulatory Visit (INDEPENDENT_AMBULATORY_CARE_PROVIDER_SITE_OTHER)
Admission: RE | Admit: 2021-09-01 | Discharge: 2021-09-01 | Disposition: A | Discharge status: Home / Self Care | Payer: BC Managed Care – PPO | Source: Ambulatory Visit | Attending: Physician Assistant | Admitting: Physician Assistant

## 2021-09-01 ENCOUNTER — Other Ambulatory Visit: Payer: Self-pay

## 2021-09-01 ENCOUNTER — Ambulatory Visit (INDEPENDENT_AMBULATORY_CARE_PROVIDER_SITE_OTHER): Payer: BC Managed Care – PPO | Admitting: Pharmacist

## 2021-09-01 VITALS — BP 134/80 | HR 102

## 2021-09-01 DIAGNOSIS — E782 Mixed hyperlipidemia: Secondary | ICD-10-CM

## 2021-09-01 DIAGNOSIS — I10 Essential (primary) hypertension: Secondary | ICD-10-CM | POA: Diagnosis not present

## 2021-09-01 DIAGNOSIS — I77810 Thoracic aortic ectasia: Secondary | ICD-10-CM | POA: Diagnosis not present

## 2021-09-01 MED ORDER — METOPROLOL SUCCINATE ER 50 MG PO TB24: 50.0000 mg | ORAL_TABLET | Freq: Every day | ORAL | 3 refills | Status: DC

## 2021-09-01 MED ORDER — IOHEXOL 350 MG/ML SOLN
100.0000 mL | Freq: Once | INTRAVENOUS | Status: AC | PRN
  Administered 2021-09-01: 09:00:00 100 mL via INTRAVENOUS

## 2021-09-01 NOTE — Progress Notes (Signed)
Patient ID: Devon Wood                 DOB: [birth year]                      MRN: [id number]     HPI: [name] is a 60 y.o. male patient of Dr. Royann Shivers referred by Dayton Scrape, PA to HTN clinic. PMH is significant for CAD, chronic back pain, DM 2, hypertension, hyperlipidemia, and morbid obesity.  Patient had a remote stent placement to LAD in 2004 by Dr. Jenne Campus.  Repeat cardiac catheterization in 2011 showed widely patent stent.  A previous remote echocardiogram reported dilated ascending aorta, however this is not confirmed by chest CT. Patient was seen by Elon Jester for preop clearance. Found to have elevated HR and BP. Metoprolol succinate 25mg  daily was added and he was advised to decrease caffeine.   Patient presents today for follow up. He has had 2 neck surgeries since seeing . States currently his pain level is a 4. When he woke up it was an 8. He does not check his BP/HR at home. Does not have a cuff. Has decreased his coffee intake to 1 cup per day. Still drinking tea. Denies dizziness, lightheadedness, headache, blurred vision, SOB, palpitations, chest pain or swelling. Activity level is limited due to surgeries.   Current HTN meds: lisinopril/HCTZ 20/21.5mg  daily, metoprolol succinate 25mg  daily BP goal: < 130/80 HR goal <100 LDL goal: <55  Family History: The patient's  family history includes COPD in his brother, brother, and mother; Cancer in his maternal grandfather; Dementia in his maternal grandmother; Heart attack in his father and paternal grandfather; Heart failure in his paternal grandmother; Hypertension in his sister.   Social History: prior tobacco use, occasional ETOH, no illict drugs  Diet: 1 cup of coffee, 3 cups of crystal light, diet gingerale  Exercise: none due to neck surgery  Home BP readings: none  Wt Readings from Last 3 Encounters:  08/11/21 220 lb (99.8 kg)  07/28/21 220 lb (99.8 kg)  07/21/21 227 lb (103 kg)   BP Readings from Last 3  Encounters:  08/12/21 (!) 152/83  07/28/21 (!) 145/76  07/21/21 (!) 140/96   Pulse Readings from Last 3 Encounters:  08/12/21 (!) 106  07/28/21 90  07/21/21 (!) 108    Renal function: CrCl cannot be calculated (Unknown ideal weight.).  Past Medical History:  Diagnosis Date   Anxiety    Arthritis    Bronchitis    hx of;last time couple of yrs ago   Bronchitis    hx of   CAD (coronary artery disease) 10/10/2013   2004 2.0 x 25 mm drug-eluting cypher stent to LAD    Chronic back pain    L3 buldging disc;DDD   Complication of anesthesia    20-30 second asystole episode during induction  04/2003,  was on b-blocker tx at the time, found to have 80% LAD lesion s/p stent (but unclear if CAD was the direct cause)    Coronary artery disease 2004   stent placement, sees Dr "C" [facility]   Depression    Diabetes mellitus    takes Glipizide and Metformin daily   DM type 2 causing complication (HCC) 10/10/2013   GERD (gastroesophageal reflux disease)    takes Pepcid daily   Gout    hx of   History of kidney stones    HTN (hypertension) 10/10/2013   Hypercholesteremia  takes Simvastatin daily   Hypertension    takes Lisinopril daily   Joint pain    Mixed hyperlipidemia 10/10/2013   Morbid obesity (HCC) 10/10/2013   Nocturia    Peptic ulcer     Current Outpatient Medications on File Prior to Visit  Medication Sig Dispense Refill   allopurinol (ZYLOPRIM) 100 MG tablet Take 100 mg by mouth daily.      aspirin 81 MG tablet Take 81 mg by mouth daily.     colchicine 0.6 MG tablet Take 0.6 mg by mouth daily as needed (For gout flares).     escitalopram (LEXAPRO) 10 MG tablet Take 10 mg by mouth daily.     famotidine (PEPCID) 20 MG tablet Take 20 mg by mouth daily.      fluticasone (FLONASE) 50 MCG/ACT nasal spray Place 2 sprays into both nostrils daily as needed for allergies.     HYDROcodone-acetaminophen (NORCO/VICODIN) 5-325 MG tablet Take 1 tablet by mouth  every 4 (four) hours as needed for moderate pain. 20 tablet 0   indomethacin (INDOCIN) 50 MG capsule Take 50 mg by mouth daily as needed (gout flare).     lisinopril-hydrochlorothiazide (PRINZIDE,ZESTORETIC) 20-12.5 MG per tablet Take 1 tablet by mouth daily.     meloxicam (MOBIC) 15 MG tablet Take 15 mg by mouth daily.     metFORMIN (GLUCOPHAGE) 500 MG tablet Take 1,000 mg by mouth 2 (two) times daily with a meal.      methocarbamol (ROBAXIN) 500 MG tablet Take 1 tablet (500 mg total) by mouth 4 (four) times daily. 45 tablet 0   methylPREDNISolone (MEDROL DOSEPAK) 4 MG TBPK tablet Take 4-24 mg by mouth as directed.     metoprolol succinate (TOPROL XL) 25 MG 24 hr tablet Take 1 tablet (25 mg total) by mouth daily. 90 tablet 3   oxyCODONE-acetaminophen (PERCOCET) 5-325 MG tablet Take 1 tablet by mouth every 4 (four) hours as needed for severe pain. 20 tablet 0   Potassium 99 MG TABS Take 99 mg by mouth daily.     simvastatin (ZOCOR) 40 MG tablet Take 20 mg by mouth daily.       sitaGLIPtin (JANUVIA) 100 MG tablet Take 50 mg by mouth daily.     Current Facility-Administered Medications on File Prior to Visit  Medication Dose Route Frequency Provider Last Rate Last Admin   iohexol (OMNIPAQUE) 350 MG/ML injection 100 mL  100 mL Intravenous Once PRN Dyann Kief, PA-C        Allergies  Allergen Reactions   Erythromycin Rash    There were no vitals taken for this visit.   Assessment/Plan:  1. Hypertension - BP is close to goal of <130/80. HR elevated. Will increase metoprolol succinate to 50mg  daily. Continue lisinopril/HCTZ 20/12.5mg  daily. I have asked him to pick up a BP cuff and monitor BP and HR at home. Follow up in 4 weeks.  2. HLD- LDL 70 on last check. He is on simvastatin 20mg  daily. He had leg pain with simvastatin 40mg . I offered him a different statin, but he states he is doing fine on the 20mg . Due to his CAD and DM his LDL should be <55. I will re-address at next  appointment trying a high intensity statin such as rosuvastatin 20mg  or adding on zetia if he does not want to change statins.   Thank you  , Pharm.D, BCPS, CPP [facility]  [address], [place],   Phone: [phone]; Fax: [phone]

## 2021-09-01 NOTE — Patient Instructions (Addendum)
Please increase metoprolol to 50mg  daily. Continue lisinopril/HCTZ 20/21.5mg  daily.  Follow up in clinic in 1 month  If you can, pick up a blood pressure cuff and start checking your blood pressure a few times a week. Also record your heart rate.  Hypertension "High blood pressure"  Hypertension is often called The Silent Killer. It rarely causes symptoms until it is extremely  high or has done damage to other organs in the body. For this reason, you should have your  blood pressure checked regularly by your physician. We will check your blood pressure  every time you see a provider at one of our offices.   Your blood pressure reading consists of two numbers. Ideally, blood pressure should be  below 120/80. The first (top) number is called the systolic pressure. It measures the  pressure in your arteries as your heart beats. The second (bottom) number is called the diastolic pressure. It measures the pressure in your arteries as the heart relaxes between beats.  The benefits of getting your blood pressure under control are enormous. A 10-point  reduction in systolic blood pressure can reduce your risk of stroke by 27% and heart failure by 28%  Your blood pressure goal is <130/80  To check your pressure at home you will need to:  1. Sit up in a chair, with feet flat on the floor and back supported. Do not cross your ankles or legs. 2. Rest your left arm so that the cuff is about heart level. If the cuff goes on your upper arm,  then just relax the arm on the table, arm of the chair or your lap. If you have a wrist cuff, we  suggest relaxing your wrist against your chest (think of it as Pledging the Flag with the  wrong arm).  3. Place the cuff snugly around your arm, about 1 inch above the crook of your elbow. The  cords should be inside the groove of your elbow.  4. Sit quietly, with the cuff in place, for about 5 minutes. After that 5 minutes press the power  button to  start a reading. 5. Do not talk or move while the reading is taking place.  6. Record your readings on a sheet of paper. Although most cuffs have a memory, it is often  easier to see a pattern developing when the numbers are all in front of you.  7. You can repeat the reading after 1-3 minutes if it is recommended  Make sure your bladder is empty and you have not had caffeine or tobacco within the last 30 min  Always bring your blood pressure log with you to your appointments. If you have not brought your monitor in to be double checked for accuracy, please bring it to your next appointment.  You can find a list of validated (accurate) blood pressure cuffs at   SUGAR  Sugar is a huge problem in the modern day diet. Sugar is a HUGE contributor to heart disease, diabetes, high triglyceride levels, fatty liver diease and obesity. Sugar is hidden in almost all packaged foods/beverages. It adds no nutritional benefit to your body and can cause major harm. Added sugar is extra sugar that is added beyond what is naturally found. The American Heart Association recommends limiting added sugars to no more than 25g for women and 36 grams for men per day.  There are many names for sugar maltose, sucrose (names ending in "ose"), high fructose corn syrup, molasses, cane sugar,  corn sweetener, raw sugar, syrup, honey or fruit juice concentrate.   One of the best ways to limit your added sugars is to stop drinking sweetened beverages such as soda, sweet tea, fruit juice or fancy coffee's. There is 65g of added sugars in one 20oz bottle of Coke!! That is equal to 6 donuts.   Pay attention and read all nutrition facts labels. Below is an examples of a nutrition facts label. The #1 is showing you the total sugars where the # 2 is showing you the added sugars. This one severing has almost the max amount of added sugars per day!  Watch out for items that say "low fat" or "no added sugar" as these  products are typically very high in sugar. The food industry uses these terms to fool you into thinking they are healthy.  For more information on the dangers of sugar watch WHY Sugar is as Bad as Alcohol (Fructose, The Liver Toxin) on YouTube.    EXERCISE  Exercise can help lower your blood pressure ~5 points systolic (top #) and 8 points diastolic (bottom #)  Exercise is good. Weve all heard that. In an ideal world, we would all have time and resources to  get plenty of it. When you are active your heart pumps more efficiently and you will feel better.  Multiple studies show that even walking regularly has benefits that include living a longer life.  The American Heart Association recommends 90-150 minutes per week of exercise (30 minutes  per day most days of the week). You can do this in any increment you wish. Nine or more  10-minute walks count. So does an hour-long exercise class. Break the time apart into what will  work in your life. Some of the best things you can do include walking briskly, jogging, cycling or  swimming laps. Not everyone is ready to exercise. Sometimes we need to start with just getting active. Here  are some easy ways to be more active throughout the day:  Take the stairs instead of the elevator  Go for a 10-15 minute walk during your lunch break (find a friend to make it more enjoyable)  When shopping, park at the back of the parking lot  If you take public transportation, get off one stop early and walk the extra distance  Pace around while making phone calls (most of [place] are not attached to phone cords any longer!) Check with your doctor if you arent sure what your limitations may be. Always remember to drink plenty of water when doing any type of exercise. Dont feel like a failure if youre not getting the 90-150 minutes per week. If you started by being  a couch potato, then just a 10-minute walk each day is a huge improvement. Start with little   victories and work your way up.   Healthy Eating Tips  When looking to improve your eating habits, whether to lose weight, lower blood pressure or just be healthier, it helps to know what a serving size is.   Grains 1 slice of bread,  bagel,  cup pasta or rice  Vegetables 1 cup fresh or raw vegetables,  cup cooked or canned Fruits 1 piece of medium sized fruit,  cup canned,   Meats/Proteins  cup dried       1 oz meat, 1 egg,  cup cooked beans, nuts or seeds  Dairy        Fats Individual yogurt container, 1 cup (8oz)  1 teaspoon margarine/butter or vegetable  milk or milk alternative, 1 slice of cheese          oil; 1 tablespoon mayonnaise or salad dressing                  Plan ahead: make a menu of the meals for a week then create a grocery list to go with  that menu. Consider meals that easily stretch into a night of leftovers, such as stews or  casseroles. Or consider making two of your favorite meal and put one in the freezer or fridge for  another night.  When you get home from the grocery store wash and prepare your vegetables and fruits.  Then when you need them they are ready to go.  Tips for going to the grocery store:  Buy store or generic brands  Check the weekly ad from your store on-line or in their in-store flyer  Look at the unit price on the shelf tag to compare/contrast the costs of different items  Buy fruits/vegetables in season  Carrots, bananas and apples are low-cost, naturally healthy items  If meats or frozen vegetables are on sale, buy some extras and put in your freezer  Limit buying prepared or ready to eat items, even if they are pre-made salads or fruit snacks  Do not shop when youre hungry  Foods at eye level tend to be more expensive. Look on the high and low shelves for deals.  Consider shopping at the farmers market for fresh foods in season.  Choose canned tuna or salmon instead of fresh  Avoid the cookie and chip aisles (these are  expensive, high in calories and low in  nutritional value) Healthy food preparations:  If you cant get lean hamburger, be sure to drain the fat when cooking  Steam, saut (in olive oil), grill or bake foods  Experiment with different seasonings to avoid adding salt to your foods. Kosher salt, sea salt and Himalayan salt are all still salt and should be avoided

## 2021-09-02 ENCOUNTER — Telehealth: Payer: Self-pay

## 2021-09-02 DIAGNOSIS — I77810 Thoracic aortic ectasia: Secondary | ICD-10-CM

## 2021-09-02 NOTE — Telephone Encounter (Signed)
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 needs yearly CTA, multivessel atherosclerosis-already on statin but didn't tolerate higher dose and known stent. LDL goal less than 55 and being followed by lipid clinic(seen yesterday). No changes today

## 2021-09-02 NOTE — Telephone Encounter (Signed)
Spoke with patient regarding results of CTAngio. Pt agrees to plan of care and will have repeat CTAngio in 1 year to monitor AA. CTAngio order placed at this time, along with BMP. Maralyn Sago, RN

## 2021-09-30 ENCOUNTER — Ambulatory Visit (INDEPENDENT_AMBULATORY_CARE_PROVIDER_SITE_OTHER): Payer: BC Managed Care – PPO | Admitting: Pharmacist

## 2021-09-30 ENCOUNTER — Other Ambulatory Visit: Payer: Self-pay

## 2021-09-30 VITALS — BP 130/70 | HR 107

## 2021-09-30 DIAGNOSIS — I10 Essential (primary) hypertension: Secondary | ICD-10-CM | POA: Diagnosis not present

## 2021-09-30 MED ORDER — ROSUVASTATIN CALCIUM 20 MG PO TABS: 20.0000 mg | ORAL_TABLET | Freq: Every day | ORAL | 3 refills | Status: DC

## 2021-09-30 NOTE — Patient Instructions (Addendum)
Continue lisinopril/HCTZ 20/12.5mg  daily and metoprolol succinate 50mg  daily. STOP simvastatin. START rosuvastatin 20mg  daily.  Please start checking blood pressure at home 1-2 times a day  Call me with any questions [phone]

## 2021-09-30 NOTE — Progress Notes (Signed)
Patient ID: Devon Wood                 DOB: 01-03-1961                      MRN: [id number]     HPI: Devon Wood is a 61 y.o. male patient of Dr. Sallyanne Kuster referred by Nena Polio, PA to HTN clinic. PMH is significant for CAD, chronic back pain, DM 2, hypertension, hyperlipidemia, and morbid obesity.  Patient had a remote stent placement to LAD in 2004 by Dr. Tami Ribas.  Repeat cardiac catheterization in 2011 showed widely patent stent.  A previous remote echocardiogram reported dilated ascending aorta, however this is not confirmed by chest CT. Patient was seen by Selinda Eon for preop clearance. Found to have elevated HR and BP. Metoprolol succinate 25mg  daily was added and he was advised to decrease caffeine.   At last visit with PharmD, metoprolol was increased to 50mg  daily. He was asked to pick up a blood pressure cuff and start monitoring his BP and HR at home.   Patient presents today for follow up. He did not pick up a BP cuff yet, therefore home BP is not known. Patient has not received his short term disability checks and money is tight. He has a long hx of several back/neck/rib injuries. He has been out of metoprolol for 4 days because [facility] took a long time to fill it due to staff shortages. He denies dizziness, lightheadedness, headache, blurred vision, SOB, palpitations, chest pain or swelling. Diet has been poor due to lack on finances. Pain level has been about a 3-4. Started physical therapy. Not taking pain medications except meloxicam.  Current HTN meds: lisinopril/HCTZ 20/12.5mg  daily, metoprolol succinate 50mg  daily BP goal: < 130/80 HR goal <100 LDL goal: <55  Family History: The patient's  family history includes COPD in his brother, brother, and mother; Cancer in his maternal grandfather; Dementia in his maternal grandmother; Heart attack in his father and paternal grandfather; Heart failure in his paternal grandmother; Hypertension in his sister.   Social History: prior tobacco  use, occasional ETOH, no illict drugs  Diet: 1 cup of coffee, 3 cups of crystal light, diet gingerale  Exercise: none due to neck surgery  Home BP readings: none  Wt Readings from Last 3 Encounters:  08/11/21 220 lb (99.8 kg)  07/28/21 220 lb (99.8 kg)  07/21/21 227 lb (103 kg)   BP Readings from Last 3 Encounters:  09/01/21 134/80  08/12/21 (!) 152/83  07/28/21 (!) 145/76   Pulse Readings from Last 3 Encounters:  09/01/21 (!) 102  08/12/21 (!) 106  07/28/21 90    Renal function: CrCl cannot be calculated (Patient's most recent lab result is older than the maximum 21 days allowed.).  Past Medical History:  Diagnosis Date   Anxiety    Arthritis    Bronchitis    hx of;last time couple of yrs ago   Bronchitis    hx of   CAD (coronary artery disease) 10/10/2013   2004 2.0 x 25 mm drug-eluting cypher stent to LAD    Chronic back pain    L3 buldging disc;DDD   Complication of anesthesia    20-30 second asystole episode during induction  04/2003,  was on b-blocker tx at the time, found to have 80% LAD lesion s/p stent (but unclear if CAD was the direct cause)    Coronary artery disease 2004   stent placement, sees Dr Marland Kitchen  [facility]   Depression    Diabetes mellitus    takes Glipizide and Metformin daily   DM type 2 causing complication ([place]) [id number]   GERD (gastroesophageal reflux disease)    takes Pepcid daily   Gout    hx of   History of kidney stones    HTN (hypertension) 10/10/2013   Hypercholesteremia    takes Simvastatin daily   Hypertension    takes Lisinopril daily   Joint pain    Mixed hyperlipidemia 10/10/2013   Morbid obesity ([place]) 10/10/2013   Nocturia    Peptic ulcer     Current Outpatient Medications on File Prior to Visit  Medication Sig Dispense Refill   allopurinol (ZYLOPRIM) 100 MG tablet Take 100 mg by mouth daily.      aspirin 81 MG tablet Take 81 mg by mouth daily.     colchicine 0.6 MG tablet Take 0.6 mg by mouth  daily as needed (For gout flares).     escitalopram (LEXAPRO) 10 MG tablet Take 10 mg by mouth daily.     famotidine (PEPCID) 20 MG tablet Take 20 mg by mouth daily.      fluticasone (FLONASE) 50 MCG/ACT nasal spray Place 2 sprays into both nostrils daily as needed for allergies.     HYDROcodone-acetaminophen (NORCO/VICODIN) 5-325 MG tablet Take 1 tablet by mouth every 4 (four) hours as needed for moderate pain. 20 tablet 0   indomethacin (INDOCIN) 50 MG capsule Take 50 mg by mouth daily as needed (gout flare).     lisinopril-hydrochlorothiazide (PRINZIDE,ZESTORETIC) 20-12.5 MG per tablet Take 1 tablet by mouth daily.     meloxicam (MOBIC) 15 MG tablet Take 15 mg by mouth daily.     metFORMIN (GLUCOPHAGE) 500 MG tablet Take 1,000 mg by mouth 2 (two) times daily with a meal.      methocarbamol (ROBAXIN) 500 MG tablet Take 1 tablet (500 mg total) by mouth 4 (four) times daily. 45 tablet 0   metoprolol succinate (TOPROL-XL) 50 MG 24 hr tablet Take 1 tablet (50 mg total) by mouth daily. Take with or immediately following a meal. 90 tablet 3   oxyCODONE-acetaminophen (PERCOCET) 5-325 MG tablet Take 1 tablet by mouth every 4 (four) hours as needed for severe pain. 20 tablet 0   Potassium 99 MG TABS Take 99 mg by mouth daily.     simvastatin (ZOCOR) 40 MG tablet Take 20 mg by mouth daily.       sitaGLIPtin (JANUVIA) 100 MG tablet Take 50 mg by mouth daily.     No current facility-administered medications on file prior to visit.    Allergies  Allergen Reactions   Erythromycin Rash    There were no vitals taken for this visit.   Assessment/Plan:  1. Hypertension - BP is close to goal of <130/80. HR elevated. He has been out of metoprolol for several days. Is picking up refill today. I gave him a BP cuff from the patient care fund. Taught him how to use the cuff. He will start monitoring blood pressure and HR at home. Follow up in 1 month.  2. HLD- LDL 70 on last check. He is on simvastatin 20mg   daily. He had leg pain with simvastatin 40mg . Patient agreeable to switching to rosuvastatin 20mg  daily. Recheck labs in 2-3 months. Will set up lab work at next appointment.   Thank you  Ramond Dial, Pharm.D, BCPS, CPP [place]  [id number]  [place], [place] [address]  Phone: [phone]; Fax: [phone]

## 2021-10-28 ENCOUNTER — Other Ambulatory Visit: Payer: Self-pay

## 2021-10-28 ENCOUNTER — Ambulatory Visit (INDEPENDENT_AMBULATORY_CARE_PROVIDER_SITE_OTHER): Payer: BC Managed Care – PPO | Admitting: Pharmacist

## 2021-10-28 VITALS — BP 142/80 | HR 80

## 2021-10-28 DIAGNOSIS — I10 Essential (primary) hypertension: Secondary | ICD-10-CM

## 2021-10-28 DIAGNOSIS — E782 Mixed hyperlipidemia: Secondary | ICD-10-CM | POA: Diagnosis not present

## 2021-10-28 MED ORDER — LISINOPRIL-HYDROCHLOROTHIAZIDE 20-12.5 MG PO TABS: 2.0000 | ORAL_TABLET | Freq: Every day | ORAL | 3 refills | Status: DC

## 2021-10-28 NOTE — Progress Notes (Signed)
Patient ID: Devon Wood                 DOB: 10-01-1960                      MRN: [id number]     HPI: Devon Wood is a 61 y.o. male patient of Dr. Royann Shivers referred by Dayton Scrape, PA to HTN clinic. PMH is significant for CAD, chronic back pain, DM 2, hypertension, hyperlipidemia, and morbid obesity.  Patient had a remote stent placement to LAD in 2004 by Dr. Jenne Campus.  Repeat cardiac catheterization in 2011 showed widely patent stent.  A previous remote echocardiogram reported dilated ascending aorta, however this is not confirmed by chest CT. Patient was seen by Elon Jester for preop clearance. Found to have elevated HR and BP. Metoprolol succinate 25mg  daily was added and he was advised to decrease caffeine.   At first visit with PharmD, metoprolol was increased to 50mg  daily. He was asked to pick up a blood pressure cuff and start monitoring his BP and HR at home. On repeat appointment patient admitted to being out of metoprolol for several days. He also did not have the funds to pick up a new blood pressure cuff, therefore I provided him with one from the patient care fund.   Patient presents today for follow up. He reports his home blood pressures of 150/90 on the left and 180/105 on the right arm. Denies dizziness, lightheadedness, headache, blurred vision, SOB or swelling. He took his blood pressure medications around 7AM. Pain around a 3 today. Finally received his check from disability. Doing physical therapy 2 times a week. Otherwise very limited in mobility.  Current HTN meds: lisinopril/HCTZ 20/12.5mg  daily, metoprolol succinate 50mg  daily BP goal: < 130/80 HR goal <100 LDL goal: <55  Family History: The patient's  family history includes COPD in his brother, brother, and mother; Cancer in his maternal grandfather; Dementia in his maternal grandmother; Heart attack in his father and paternal grandfather; Heart failure in his paternal grandmother; Hypertension in his sister.   Social  History: prior tobacco use, occasional ETOH, no illict drugs  Diet: 1 cup of coffee, 3 cups of crystal light, diet gingerale  Exercise: none due to neck surgery  Home BP readings: 150/90 left 180/105 on right HR 93-97  Wt Readings from Last 3 Encounters:  08/11/21 220 lb (99.8 kg)  07/28/21 220 lb (99.8 kg)  07/21/21 227 lb (103 kg)   BP Readings from Last 3 Encounters:  09/30/21 130/70  09/01/21 134/80  08/12/21 (!) 152/83   Pulse Readings from Last 3 Encounters:  09/30/21 (!) 107  09/01/21 (!) 102  08/12/21 (!) 106    Renal function: CrCl cannot be calculated (Patient's most recent lab result is older than the maximum 21 days allowed.).  Past Medical History:  Diagnosis Date   Anxiety    Arthritis    Bronchitis    hx of;last time couple of yrs ago   Bronchitis    hx of   CAD (coronary artery disease) 10/10/2013   2004 2.0 x 25 mm drug-eluting cypher stent to LAD    Chronic back pain    L3 buldging disc;DDD   Complication of anesthesia    20-30 second asystole episode during induction  04/2003,  was on b-blocker tx at the time, found to have 80% LAD lesion s/p stent (but unclear if CAD was the direct cause)    Coronary artery disease 2004  stent placement, sees Dr "[facility]" [facility]   Depression    Diabetes mellitus    takes Glipizide and Metformin daily   DM type 2 causing complication (HCC) 10/10/2013   GERD (gastroesophageal reflux disease)    takes Pepcid daily   Gout    hx of   History of kidney stones    HTN (hypertension) 10/10/2013   Hypercholesteremia    takes Simvastatin daily   Hypertension    takes Lisinopril daily   Joint pain    Mixed hyperlipidemia 10/10/2013   Morbid obesity (HCC) 10/10/2013   Nocturia    Peptic ulcer     Current Outpatient Medications on File Prior to Visit  Medication Sig Dispense Refill   allopurinol (ZYLOPRIM) 100 MG tablet Take 100 mg by mouth daily.      aspirin 81 MG tablet Take 81 mg by mouth  daily.     colchicine 0.6 MG tablet Take 0.6 mg by mouth daily as needed (For gout flares).     escitalopram (LEXAPRO) 10 MG tablet Take 10 mg by mouth daily.     famotidine (PEPCID) 20 MG tablet Take 20 mg by mouth daily.      fluticasone (FLONASE) 50 MCG/ACT nasal spray Place 2 sprays into both nostrils daily as needed for allergies.     HYDROcodone-acetaminophen (NORCO/VICODIN) 5-325 MG tablet Take 1 tablet by mouth every 4 (four) hours as needed for moderate pain. (Patient not taking: Reported on 09/30/2021) 20 tablet 0   indomethacin (INDOCIN) 50 MG capsule Take 50 mg by mouth daily as needed (gout flare).     lisinopril-hydrochlorothiazide (PRINZIDE,ZESTORETIC) 20-12.5 MG per tablet Take 1 tablet by mouth daily.     meloxicam (MOBIC) 15 MG tablet Take 15 mg by mouth daily.     metFORMIN (GLUCOPHAGE) 500 MG tablet Take 1,000 mg by mouth 2 (two) times daily with a meal.      methocarbamol (ROBAXIN) 500 MG tablet Take 1 tablet (500 mg total) by mouth 4 (four) times daily. (Patient not taking: Reported on 09/30/2021) 45 tablet 0   metoprolol succinate (TOPROL-XL) 50 MG 24 hr tablet Take 1 tablet (50 mg total) by mouth daily. Take with or immediately following a meal. 90 tablet 3   oxyCODONE-acetaminophen (PERCOCET) 5-325 MG tablet Take 1 tablet by mouth every 4 (four) hours as needed for severe pain. (Patient not taking: Reported on 09/30/2021) 20 tablet 0   Potassium 99 MG TABS Take 99 mg by mouth daily.     rosuvastatin (CRESTOR) 20 MG tablet Take 1 tablet (20 mg total) by mouth daily. 90 tablet 3   sitaGLIPtin (JANUVIA) 100 MG tablet Take 50 mg by mouth daily.     No current facility-administered medications on file prior to visit.    Allergies  Allergen Reactions   Erythromycin Rash    There were no vitals taken for this visit.   Assessment/Plan:  1. Hypertension - BP is above goal of <130/80. HR much better controlled today. Will increase lisinopril/HCTZ 20/12.5mg  daily to 40/25mg   daily. He will take 2 tablets on the combo 20/12.5mg  daily. Follow up in clinic in 4 weeks. He will need a BMP at that time. I advised he check his BP at home with his left arm.  2. HLD- LDL-C 70 on last check. He is on simvastatin 20mg  daily. He had leg pain with simvastatin 40mg . Patient agreeable to switching to rosuvastatin 20mg  daily. Recheck labs at next appointment.   Thank you  Olene Floss, Pharm.D, BCPS, CPP [facility]  [address] [place], [place] [address]  Phone: [phone]; Fax: [phone]

## 2021-10-28 NOTE — Patient Instructions (Addendum)
Please increase lisinopril 20/12.5mg  to 2 tablets daily (total of 40mg  of lisinopril and 25mg  of hydrochlorothiazide daily) Continue metoprolol 50mg  daily  Continue checking blood pressure at home  We will do fasting labs at next appointment

## 2021-11-23 ENCOUNTER — Ambulatory Visit: Payer: BC Managed Care – PPO

## 2021-11-23 ENCOUNTER — Other Ambulatory Visit: Payer: BC Managed Care – PPO

## 2022-01-27 ENCOUNTER — Other Ambulatory Visit (HOSPITAL_COMMUNITY): Payer: Self-pay | Admitting: Neurosurgery

## 2022-01-27 ENCOUNTER — Other Ambulatory Visit: Payer: Self-pay | Admitting: Neurosurgery

## 2022-01-27 DIAGNOSIS — G959 Disease of spinal cord, unspecified: Secondary | ICD-10-CM

## 2022-02-06 ENCOUNTER — Ambulatory Visit (HOSPITAL_COMMUNITY)
Admission: RE | Admit: 2022-02-06 | Discharge: 2022-02-06 | Disposition: A | Discharge status: Home / Self Care | Payer: BC Managed Care – PPO | Source: Ambulatory Visit | Attending: Neurosurgery | Admitting: Neurosurgery

## 2022-02-06 DIAGNOSIS — G959 Disease of spinal cord, unspecified: Secondary | ICD-10-CM | POA: Diagnosis present

## 2022-07-12 ENCOUNTER — Other Ambulatory Visit: Payer: Self-pay | Admitting: Neurosurgery

## 2022-07-12 DIAGNOSIS — M5440 Lumbago with sciatica, unspecified side: Secondary | ICD-10-CM

## 2022-07-12 DIAGNOSIS — M542 Cervicalgia: Secondary | ICD-10-CM

## 2022-07-21 ENCOUNTER — Ambulatory Visit: Payer: Self-pay | Admitting: Physician Assistant

## 2022-07-26 ENCOUNTER — Ambulatory Visit
Admission: RE | Admit: 2022-07-26 | Discharge: 2022-07-26 | Disposition: A | Discharge status: Home / Self Care | Payer: BC Managed Care – PPO | Source: Ambulatory Visit | Attending: Neurosurgery | Admitting: Neurosurgery

## 2022-07-26 DIAGNOSIS — M5440 Lumbago with sciatica, unspecified side: Secondary | ICD-10-CM

## 2022-07-26 DIAGNOSIS — M542 Cervicalgia: Secondary | ICD-10-CM

## 2022-08-15 ENCOUNTER — Other Ambulatory Visit: Payer: BC Managed Care – PPO

## 2022-08-17 ENCOUNTER — Ambulatory Visit (HOSPITAL_BASED_OUTPATIENT_CLINIC_OR_DEPARTMENT_OTHER): Admission: RE | Admit: 2022-08-17 | Payer: BC Managed Care – PPO | Source: Ambulatory Visit

## 2022-08-31 ENCOUNTER — Inpatient Hospital Stay: Admission: RE | Admit: 2022-08-31 | Payer: BC Managed Care – PPO | Source: Ambulatory Visit

## 2022-09-10 ENCOUNTER — Other Ambulatory Visit: Payer: Self-pay | Admitting: Cardiovascular Disease

## 2022-11-09 ENCOUNTER — Other Ambulatory Visit: Payer: Self-pay | Admitting: Cardiovascular Disease

## 2022-11-25 IMAGING — CT CT ANGIO CHEST
2 of 8 series · 16 of 46 positions shown · IV contrast (OMNIPAQUE 350)
Comparison: CT chest, 02/23/2021.  Chest XR, 05/27/2021.

CLINICAL DATA: Dilated ascending aorta

EXAM:
CT ANGIOGRAPHY CHEST WITH CONTRAST
TECHNIQUE: Multidetector CT imaging of the chest was performed using the
standard protocol during bolus administration of intravenous
contrast. Multiplanar CT image reconstructions and MIPs were
obtained to evaluate the vascular anatomy.
CONTRAST:  100mL OMNIPAQUE IOHEXOL 350 MG/ML SOLN

[Series 4: aorta 3.0 bf37 2 · axial · 0.80mm/px · z∈[-322,-46]mm · 13 of 108 slices shown]
[im 8/108  lung]
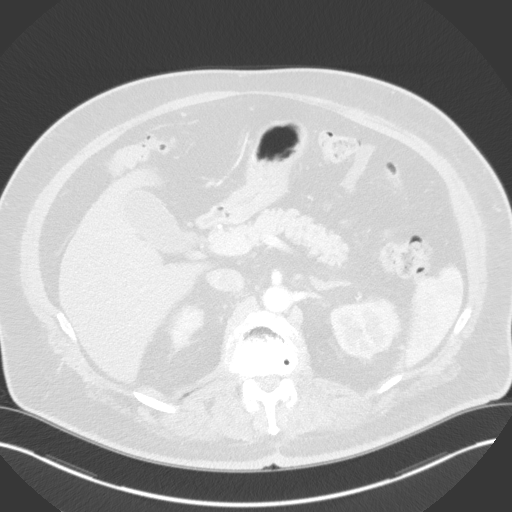
[im 16/108  soft-tissue]
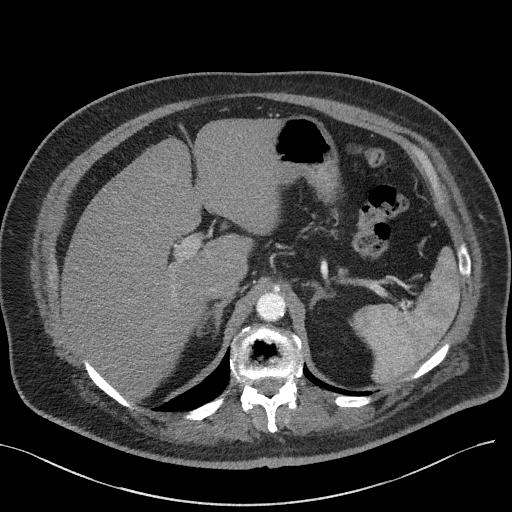
[im 23/108  lung]
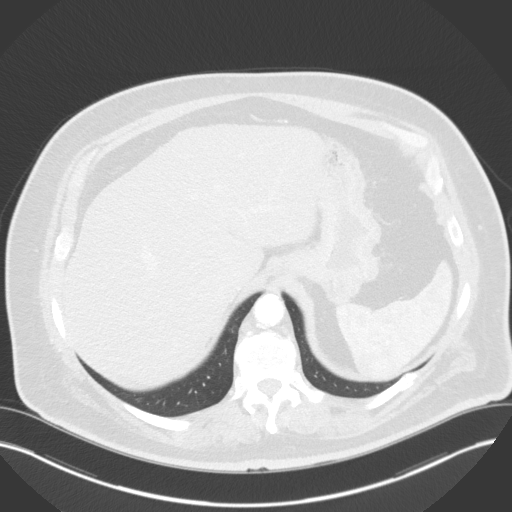
[im 31/108  soft-tissue]
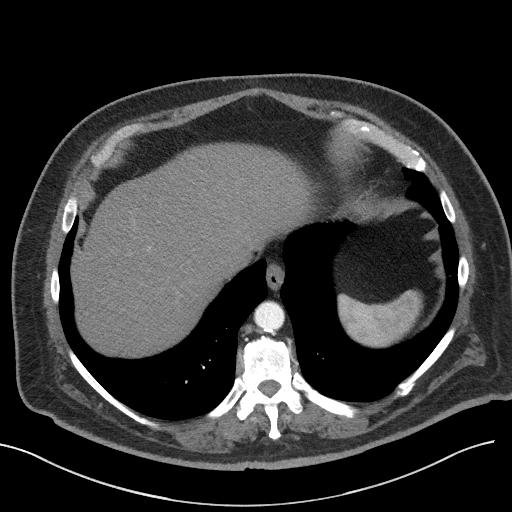
[im 39/108  lung]
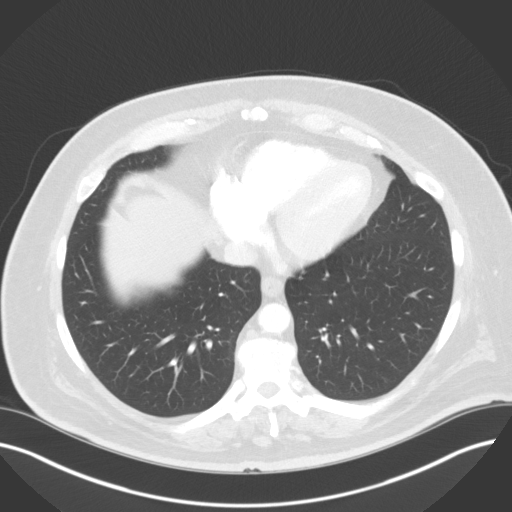
[im 46/108  soft-tissue]
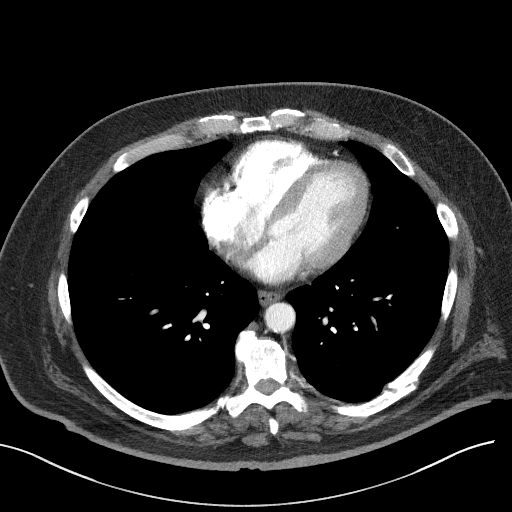
[im 54/108  lung]
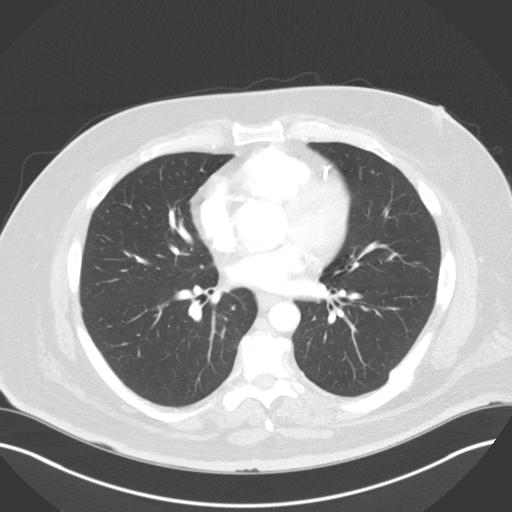
[im 62/108  soft-tissue]
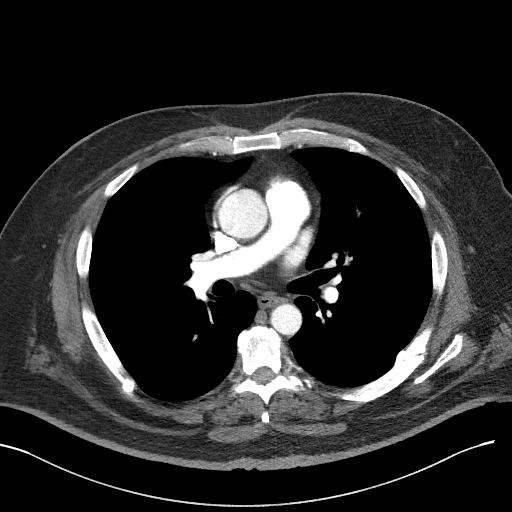
[im 69/108  lung]
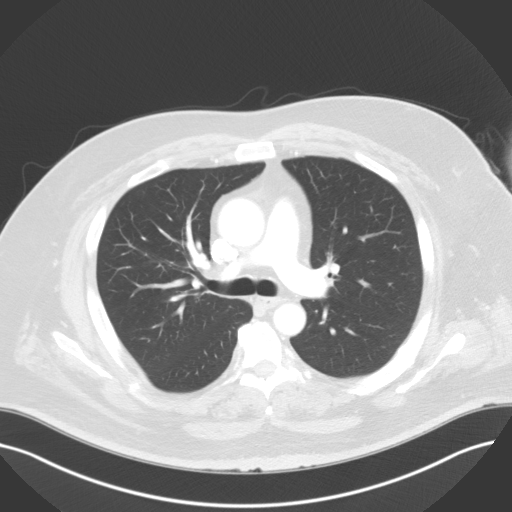
[im 77/108  soft-tissue]
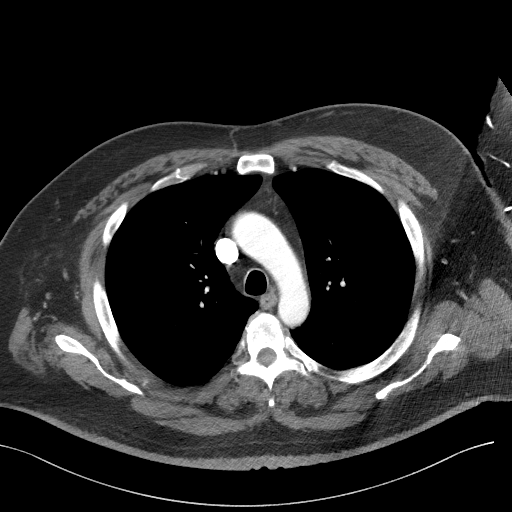
[im 85/108  lung]
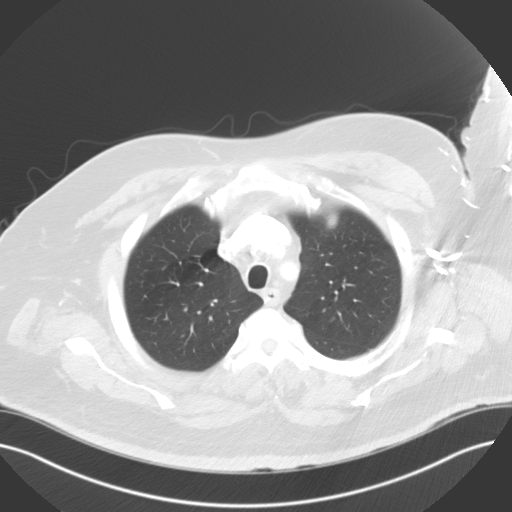
[im 92/108  soft-tissue]
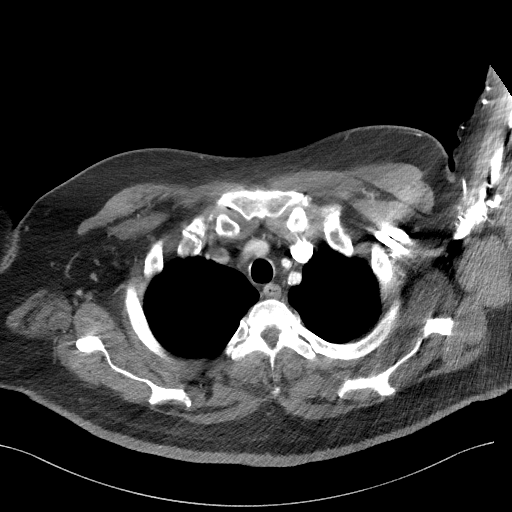
[im 100/108  lung]
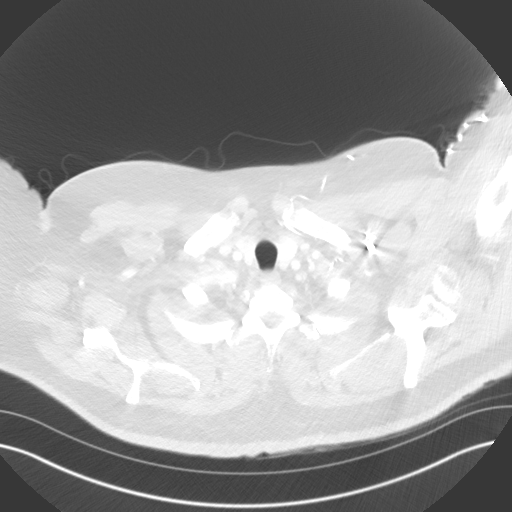

[Series 7: coronals · coronal · 0.63mm/px · 3 of 140 slices shown]
[im 35/140  soft-tissue]
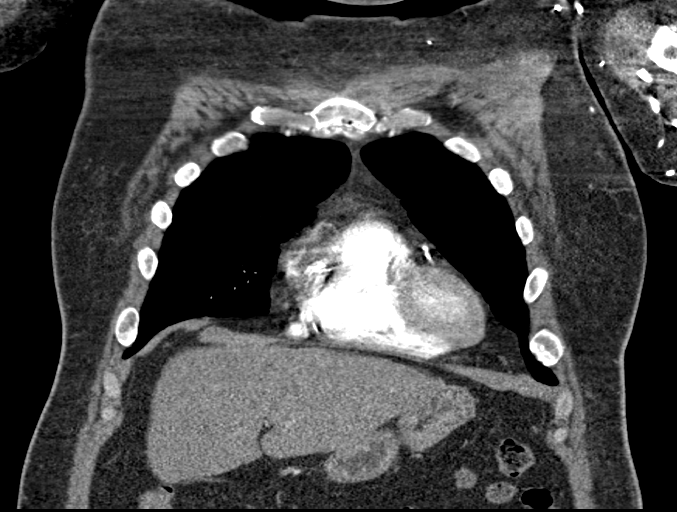
[im 70/140  soft-tissue]
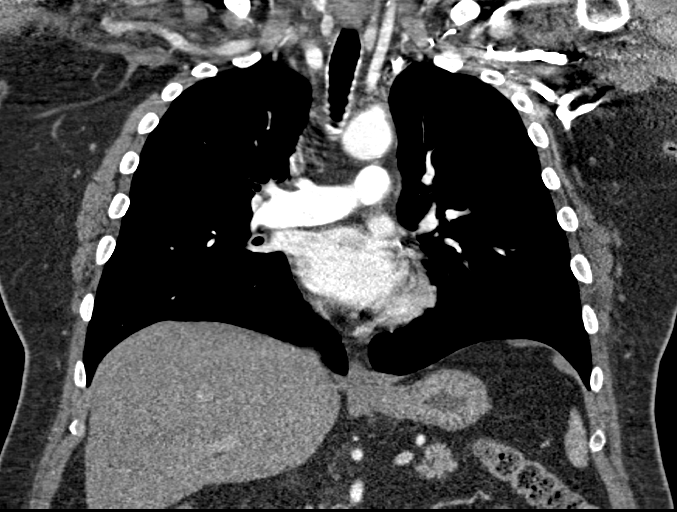
[im 105/140  soft-tissue]
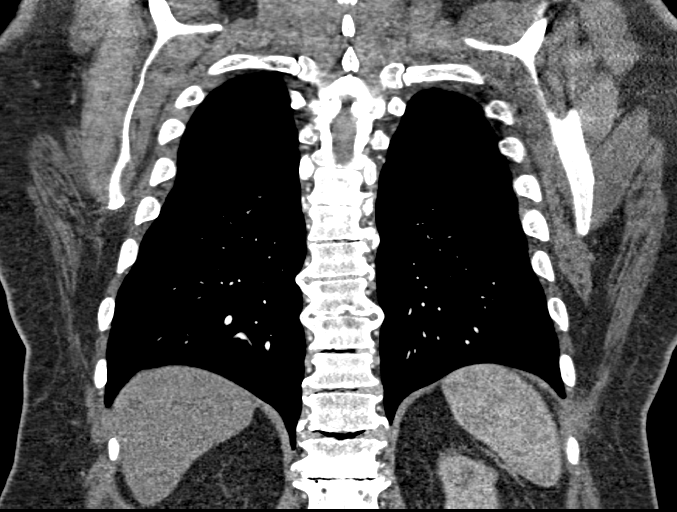

[16 of 46 positions shown; findings below may reference images not displayed]

FINDINGS: CARDIOVASCULAR:

Limitations by motion: Moderate

Preferential opacification of the thoracic aorta. No evidence of
thoracic aortic dissection.

Aortic Root: Motion degraded, such that the aortic root can not be
accurately measured.

Thoracic Aorta:

--Ascending Aorta: 4.0 cm

--Aortic Arch: 3.1 cm

--Descending Aorta: 2.6 cm

Conventional 3 vessel LEFT aortic arch. Difficult evaluation of the
innominate origin, secondary to contrast bolus. No significant
atherosclerotic disease at the origins of the great vessels. No
stenosis.

Other:

Normal heart size. No pericardial effusion. Multivessel calcified
coronary atherosclerosis. No segmental or larger pulmonary embolus.

Mediastinum/Nodes: No enlarged mediastinal, hilar, or axillary lymph
nodes. Thyroid gland, trachea, and esophagus demonstrate no
significant findings.

Lungs/Pleura: Lungs are clear. No suspicious pulmonary nodule or
mass. No pleural effusion or pneumothorax.

Upper Abdomen: No acute abnormality. Incidental retroaortic LEFT
renal vein. Multifocal punctate, nonobstructing LEFT
nephrolithiasis.

Musculoskeletal: Asymmetric severe RIGHT shoulder arthropathy,
incompletely imaged. Healing, multilevel bilateral rib fractures,
better described on 02/23/2021 comparison. No interval osseous
abnormality. Multilevel degenerative change of imaged thoracic
spine.

Review of the MIP images confirms the above findings.
IMPRESSION: 1. 4.0 cm ascending thoracic aorta. Recommend annual imaging
followup by CTA or MRA. This recommendation follows 4717
ACCF/AHA/AATS/ACR/ASA/SCA/TRUTER/DELALEU/MALCI/ESQIN Guidelines for the
Diagnosis and Management of Patients with Thoracic Aortic Disease.
Circulation. 4717; 121: E266-e369. Aortic aneurysm NOS (M5KSV-LR6.4)
2. Multivessel, severe burden of coronary atherosclerosis. Consider
coronary calcium scoring and referral, if not already performed.
3. Healing multilevel bilateral rib fracture deformities, without
evidence of complication since [DATE] comparison.
4. Additional incidental and senescent findings, as above.

## 2023-01-10 ENCOUNTER — Other Ambulatory Visit: Payer: Self-pay | Admitting: Neurosurgery

## 2023-01-10 DIAGNOSIS — M5440 Lumbago with sciatica, unspecified side: Secondary | ICD-10-CM

## 2023-01-12 ENCOUNTER — Other Ambulatory Visit: Payer: Self-pay

## 2023-01-12 MED ORDER — METOPROLOL SUCCINATE ER 50 MG PO TB24: 50.0000 mg | ORAL_TABLET | Freq: Every day | ORAL | 0 refills | Status: DC

## 2023-01-17 ENCOUNTER — Other Ambulatory Visit: Payer: Self-pay

## 2023-01-17 MED ORDER — METOPROLOL SUCCINATE ER 50 MG PO TB24: 50.0000 mg | ORAL_TABLET | Freq: Every day | ORAL | 0 refills | Status: DC

## 2023-01-25 ENCOUNTER — Other Ambulatory Visit: Payer: BC Managed Care – PPO

## 2023-01-26 ENCOUNTER — Ambulatory Visit
Admission: RE | Admit: 2023-01-26 | Discharge: 2023-01-26 | Disposition: A | Discharge status: Home / Self Care | Payer: BC Managed Care – PPO | Source: Ambulatory Visit | Attending: Neurosurgery | Admitting: Neurosurgery

## 2023-01-26 DIAGNOSIS — M5440 Lumbago with sciatica, unspecified side: Secondary | ICD-10-CM

## 2023-03-04 ENCOUNTER — Other Ambulatory Visit: Payer: Self-pay | Admitting: Cardiovascular Disease

## 2023-07-09 ENCOUNTER — Other Ambulatory Visit: Payer: Self-pay | Admitting: Cardiovascular Disease

## 2023-07-19 ENCOUNTER — Other Ambulatory Visit: Payer: Self-pay | Admitting: Cardiovascular Disease

## 2023-07-19 NOTE — Telephone Encounter (Signed)
Patient has request metoprolol succinate 50 mg. He has already had all three refill request. Lats office visit was 10/28/21. He doesn't have an appointment scheduled.

## 2023-09-06 ENCOUNTER — Inpatient Hospital Stay (HOSPITAL_COMMUNITY)
Admission: EM | Admit: 2023-09-06 | Discharge: 2023-09-07 | DRG: 322 | Disposition: A | Discharge status: Home / Self Care | Payer: 59 | Attending: Cardiology | Admitting: Cardiology

## 2023-09-06 ENCOUNTER — Other Ambulatory Visit: Payer: Self-pay

## 2023-09-06 ENCOUNTER — Encounter (HOSPITAL_COMMUNITY): Payer: Self-pay

## 2023-09-06 ENCOUNTER — Encounter (HOSPITAL_COMMUNITY)
Admission: EM | Disposition: A | Discharge status: Home / Self Care | Payer: Self-pay | Source: Home / Self Care | Attending: Cardiology

## 2023-09-06 DIAGNOSIS — N1832 Chronic kidney disease, stage 3b: Secondary | ICD-10-CM | POA: Diagnosis present

## 2023-09-06 DIAGNOSIS — Z7902 Long term (current) use of antithrombotics/antiplatelets: Secondary | ICD-10-CM

## 2023-09-06 DIAGNOSIS — Z809 Family history of malignant neoplasm, unspecified: Secondary | ICD-10-CM | POA: Diagnosis not present

## 2023-09-06 DIAGNOSIS — I129 Hypertensive chronic kidney disease with stage 1 through stage 4 chronic kidney disease, or unspecified chronic kidney disease: Secondary | ICD-10-CM | POA: Diagnosis present

## 2023-09-06 DIAGNOSIS — F32A Depression, unspecified: Secondary | ICD-10-CM | POA: Diagnosis present

## 2023-09-06 DIAGNOSIS — I440 Atrioventricular block, first degree: Secondary | ICD-10-CM

## 2023-09-06 DIAGNOSIS — Z79899 Other long term (current) drug therapy: Secondary | ICD-10-CM

## 2023-09-06 DIAGNOSIS — Z7984 Long term (current) use of oral hypoglycemic drugs: Secondary | ICD-10-CM

## 2023-09-06 DIAGNOSIS — E118 Type 2 diabetes mellitus with unspecified complications: Secondary | ICD-10-CM | POA: Diagnosis present

## 2023-09-06 DIAGNOSIS — Z8249 Family history of ischemic heart disease and other diseases of the circulatory system: Secondary | ICD-10-CM

## 2023-09-06 DIAGNOSIS — Z7982 Long term (current) use of aspirin: Secondary | ICD-10-CM

## 2023-09-06 DIAGNOSIS — I2510 Atherosclerotic heart disease of native coronary artery without angina pectoris: Secondary | ICD-10-CM

## 2023-09-06 DIAGNOSIS — Z87891 Personal history of nicotine dependence: Secondary | ICD-10-CM | POA: Diagnosis not present

## 2023-09-06 DIAGNOSIS — I213 ST elevation (STEMI) myocardial infarction of unspecified site: Principal | ICD-10-CM

## 2023-09-06 DIAGNOSIS — Z820 Family history of epilepsy and other diseases of the nervous system: Secondary | ICD-10-CM | POA: Diagnosis not present

## 2023-09-06 DIAGNOSIS — Z981 Arthrodesis status: Secondary | ICD-10-CM

## 2023-09-06 DIAGNOSIS — Z955 Presence of coronary angioplasty implant and graft: Secondary | ICD-10-CM

## 2023-09-06 DIAGNOSIS — I10 Essential (primary) hypertension: Secondary | ICD-10-CM

## 2023-09-06 DIAGNOSIS — I219 Acute myocardial infarction, unspecified: Secondary | ICD-10-CM

## 2023-09-06 DIAGNOSIS — N183 Chronic kidney disease, stage 3 unspecified: Secondary | ICD-10-CM | POA: Insufficient documentation

## 2023-09-06 DIAGNOSIS — E1122 Type 2 diabetes mellitus with diabetic chronic kidney disease: Secondary | ICD-10-CM | POA: Diagnosis present

## 2023-09-06 DIAGNOSIS — Z825 Family history of asthma and other chronic lower respiratory diseases: Secondary | ICD-10-CM | POA: Diagnosis not present

## 2023-09-06 DIAGNOSIS — K219 Gastro-esophageal reflux disease without esophagitis: Secondary | ICD-10-CM | POA: Diagnosis present

## 2023-09-06 DIAGNOSIS — I77810 Thoracic aortic ectasia: Secondary | ICD-10-CM | POA: Diagnosis present

## 2023-09-06 DIAGNOSIS — M109 Gout, unspecified: Secondary | ICD-10-CM | POA: Diagnosis present

## 2023-09-06 DIAGNOSIS — Z791 Long term (current) use of non-steroidal anti-inflammatories (NSAID): Secondary | ICD-10-CM | POA: Diagnosis not present

## 2023-09-06 DIAGNOSIS — I2129 ST elevation (STEMI) myocardial infarction involving other sites: Principal | ICD-10-CM

## 2023-09-06 DIAGNOSIS — E119 Type 2 diabetes mellitus without complications: Secondary | ICD-10-CM

## 2023-09-06 DIAGNOSIS — E782 Mixed hyperlipidemia: Secondary | ICD-10-CM

## 2023-09-06 HISTORY — DX: Atrioventricular block, first degree: I44.0

## 2023-09-06 HISTORY — DX: Acute myocardial infarction, unspecified: I21.9

## 2023-09-06 HISTORY — PX: CORONARY/GRAFT ACUTE MI REVASCULARIZATION: CATH118305

## 2023-09-06 HISTORY — PX: LEFT HEART CATH AND CORONARY ANGIOGRAPHY: CATH118249

## 2023-09-06 LAB — COMPREHENSIVE METABOLIC PANEL
ALT: 23 U/L (ref 0–44)
AST: 27 U/L (ref 15–41)
Albumin: 4.4 g/dL (ref 3.5–5.0)
Alkaline Phosphatase: 57 U/L (ref 38–126)
Anion gap: 10 (ref 5–15)
BUN: 28 mg/dL — ABNORMAL HIGH (ref 8–23)
CO2: 20 mmol/L — ABNORMAL LOW (ref 22–32)
Calcium: 9.4 mg/dL (ref 8.9–10.3)
Chloride: 101 mmol/L (ref 98–111)
Creatinine, Ser: 2.24 mg/dL — ABNORMAL HIGH (ref 0.61–1.24)
GFR, Estimated: 33 mL/min — ABNORMAL LOW (ref >60)
Glucose, Bld: 235 mg/dL — ABNORMAL HIGH (ref 70–99)
Potassium: 4.6 mmol/L (ref 3.5–5.1)
Sodium: 131 mmol/L — ABNORMAL LOW (ref 135–145)
Total Bilirubin: 0.4 mg/dL (ref 0.0–1.2)
Total Protein: 7.9 g/dL (ref 6.5–8.1)

## 2023-09-06 LAB — MRSA NEXT GEN BY PCR, NASAL: MRSA by PCR Next Gen: NOT DETECTED

## 2023-09-06 LAB — CBC WITH DIFFERENTIAL/PLATELET
Abs Immature Granulocytes: 0.07 10*3/uL (ref 0.00–0.07)
Basophils Absolute: 0.1 10*3/uL (ref 0.0–0.1)
Basophils Relative: 1 %
Eosinophils Absolute: 0.1 10*3/uL (ref 0.0–0.5)
Eosinophils Relative: 1 %
HCT: 38 % — ABNORMAL LOW (ref 39.0–52.0)
Hemoglobin: 12.8 g/dL — ABNORMAL LOW (ref 13.0–17.0)
Immature Granulocytes: 1 %
Lymphocytes Relative: 20 %
Lymphs Abs: 1.8 10*3/uL (ref 0.7–4.0)
MCH: 29.6 pg (ref 26.0–34.0)
MCHC: 33.7 g/dL (ref 30.0–36.0)
MCV: 88 fL (ref 80.0–100.0)
Monocytes Absolute: 0.9 10*3/uL (ref 0.1–1.0)
Monocytes Relative: 10 %
Neutro Abs: 6.2 10*3/uL (ref 1.7–7.7)
Neutrophils Relative %: 67 %
Platelets: 250 10*3/uL (ref 150–400)
RBC: 4.32 MIL/uL (ref 4.22–5.81)
RDW: 11.9 % (ref 11.5–15.5)
WBC: 9.1 10*3/uL (ref 4.0–10.5)
nRBC: 0 % (ref 0.0–0.2)

## 2023-09-06 LAB — POCT I-STAT, CHEM 8
BUN: 28 mg/dL — ABNORMAL HIGH (ref 8–23)
Calcium, Ion: 1.08 mmol/L — ABNORMAL LOW (ref 1.15–1.40)
Chloride: 75 mmol/L — ABNORMAL LOW (ref 98–111)
Creatinine, Ser: 1.1 mg/dL (ref 0.61–1.24)
Glucose, Bld: 138 mg/dL — ABNORMAL HIGH (ref 70–99)
HCT: 35 % — ABNORMAL LOW (ref 39.0–52.0)
Hemoglobin: 11.9 g/dL — ABNORMAL LOW (ref 13.0–17.0)
Potassium: 4.1 mmol/L (ref 3.5–5.1)
Sodium: 108 mmol/L — CL (ref 135–145)
TCO2: 15 mmol/L — ABNORMAL LOW (ref 22–32)

## 2023-09-06 LAB — GLUCOSE, CAPILLARY
Glucose-Capillary: 170 mg/dL — ABNORMAL HIGH (ref 70–99)
Glucose-Capillary: 148 mg/dL — ABNORMAL HIGH (ref 70–99)

## 2023-09-06 LAB — LIPID PANEL
Cholesterol: 149 mg/dL (ref 0–200)
HDL: 32 mg/dL — ABNORMAL LOW (ref >40)
LDL Cholesterol: 75 mg/dL (ref 0–99)
Total CHOL/HDL Ratio: 4.7 {ratio}
Triglycerides: 212 mg/dL — ABNORMAL HIGH (ref <150)
VLDL: 42 mg/dL — ABNORMAL HIGH (ref 0–40)

## 2023-09-06 LAB — CBC
HCT: 36.4 % — ABNORMAL LOW (ref 39.0–52.0)
Hemoglobin: 12.5 g/dL — ABNORMAL LOW (ref 13.0–17.0)
MCH: 29.9 pg (ref 26.0–34.0)
MCHC: 34.3 g/dL (ref 30.0–36.0)
MCV: 87.1 fL (ref 80.0–100.0)
Platelets: 259 10*3/uL (ref 150–400)
RBC: 4.18 MIL/uL — ABNORMAL LOW (ref 4.22–5.81)
RDW: 11.9 % (ref 11.5–15.5)
WBC: 12.7 10*3/uL — ABNORMAL HIGH (ref 4.0–10.5)
nRBC: 0 % (ref 0.0–0.2)

## 2023-09-06 LAB — CG4 I-STAT (LACTIC ACID): Lactic Acid, Venous: 1.3 mmol/L (ref 0.5–1.9)

## 2023-09-06 LAB — PROTIME-INR
INR: 1 (ref 0.8–1.2)
Prothrombin Time: 13.3 s (ref 11.4–15.2)

## 2023-09-06 LAB — CREATININE, SERUM
Creatinine, Ser: 2.12 mg/dL — ABNORMAL HIGH (ref 0.61–1.24)
GFR, Estimated: 35 mL/min — ABNORMAL LOW (ref >60)

## 2023-09-06 LAB — APTT: aPTT: 26 s (ref 24–36)

## 2023-09-06 LAB — I-STAT CG4 LACTIC ACID, ED: Lactic Acid, Venous: 3.5 mmol/L (ref 0.5–1.9)

## 2023-09-06 LAB — TROPONIN I (HIGH SENSITIVITY)
Troponin I (High Sensitivity): 62 ng/L — ABNORMAL HIGH (ref <18)
Troponin I (High Sensitivity): 4577 ng/L (ref <18)

## 2023-09-06 LAB — POCT ACTIVATED CLOTTING TIME: Activated Clotting Time: 337 s

## 2023-09-06 SURGERY — CORONARY/GRAFT ACUTE MI REVASCULARIZATION
Anesthesia: LOCAL

## 2023-09-06 MED ORDER — FENTANYL CITRATE (PF) 100 MCG/2ML IJ SOLN
INTRAMUSCULAR | Status: DC | PRN
  Administered 2023-09-06: 50 ug via INTRAVENOUS
  Administered 2023-09-06: 25 ug via INTRAVENOUS

## 2023-09-06 MED ORDER — LIDOCAINE HCL (PF) 1 % IJ SOLN
INTRAMUSCULAR | Status: DC | PRN
  Administered 2023-09-06: 2 mL via INTRADERMAL

## 2023-09-06 MED ORDER — TICAGRELOR 90 MG PO TABS
ORAL_TABLET | ORAL | Status: DC | PRN
  Administered 2023-09-06: 180 mg via ORAL

## 2023-09-06 MED ORDER — NITROGLYCERIN 1 MG/10 ML FOR IR/CATH LAB
INTRA_ARTERIAL | Status: AC
  Filled 2023-09-06: qty 10

## 2023-09-06 MED ORDER — HYDRALAZINE HCL 20 MG/ML IJ SOLN: 10.0000 mg | INTRAMUSCULAR | Status: AC | PRN

## 2023-09-06 MED ORDER — LISINOPRIL 20 MG PO TABS
20.0000 mg | ORAL_TABLET | Freq: Every day | ORAL | Status: DC
  Filled 2023-09-06: qty 1

## 2023-09-06 MED ORDER — INSULIN ASPART 100 UNIT/ML IJ SOLN
4.0000 [IU] | Freq: Three times a day (TID) | INTRAMUSCULAR | Status: DC
  Administered 2023-09-07 (×2): 4 [IU] via SUBCUTANEOUS

## 2023-09-06 MED ORDER — MIDAZOLAM HCL 2 MG/2ML IJ SOLN
INTRAMUSCULAR | Status: DC | PRN
  Administered 2023-09-06: 1 mg via INTRAVENOUS

## 2023-09-06 MED ORDER — TICAGRELOR 90 MG PO TABS
ORAL_TABLET | ORAL | Status: AC
  Filled 2023-09-06: qty 2

## 2023-09-06 MED ORDER — VERAPAMIL HCL 2.5 MG/ML IV SOLN
INTRAVENOUS | Status: DC | PRN
  Administered 2023-09-06: 10 mL via INTRA_ARTERIAL

## 2023-09-06 MED ORDER — HEPARIN (PORCINE) IN NACL 1000-0.9 UT/500ML-% IV SOLN
INTRAVENOUS | Status: DC | PRN
  Administered 2023-09-06 (×2): 500 mL

## 2023-09-06 MED ORDER — VERAPAMIL HCL 2.5 MG/ML IV SOLN
INTRAVENOUS | Status: AC
Start: 2023-09-06 — End: ?
  Filled 2023-09-06: qty 2

## 2023-09-06 MED ORDER — IOHEXOL 350 MG/ML SOLN
INTRAVENOUS | Status: DC | PRN
  Administered 2023-09-06: 170 mL

## 2023-09-06 MED ORDER — INSULIN ASPART 100 UNIT/ML IJ SOLN: 0.0000 [IU] | Freq: Every day | INTRAMUSCULAR | Status: DC

## 2023-09-06 MED ORDER — ONDANSETRON HCL 4 MG/2ML IJ SOLN
4.0000 mg | Freq: Four times a day (QID) | INTRAMUSCULAR | Status: DC | PRN
Start: 2023-09-06 — End: 2023-09-07

## 2023-09-06 MED ORDER — NITROGLYCERIN 1 MG/10 ML FOR IR/CATH LAB
INTRA_ARTERIAL | Status: DC | PRN
  Administered 2023-09-06: 200 ug via INTRACORONARY

## 2023-09-06 MED ORDER — ASPIRIN 81 MG PO TBEC
81.0000 mg | DELAYED_RELEASE_TABLET | Freq: Every day | ORAL | Status: DC
  Administered 2023-09-07: 81 mg via ORAL
  Filled 2023-09-06: qty 1

## 2023-09-06 MED ORDER — HYDROCHLOROTHIAZIDE 12.5 MG PO TABS
12.5000 mg | ORAL_TABLET | Freq: Every day | ORAL | Status: DC
  Filled 2023-09-06: qty 1

## 2023-09-06 MED ORDER — ROSUVASTATIN CALCIUM 20 MG PO TABS
40.0000 mg | ORAL_TABLET | Freq: Every day | ORAL | Status: DC
  Administered 2023-09-06: 40 mg via ORAL
  Filled 2023-09-06 (×2): qty 2

## 2023-09-06 MED ORDER — TICAGRELOR 90 MG PO TABS
90.0000 mg | ORAL_TABLET | Freq: Two times a day (BID) | ORAL | Status: DC
  Administered 2023-09-07 (×2): 90 mg via ORAL
  Filled 2023-09-06 (×2): qty 1

## 2023-09-06 MED ORDER — FENTANYL CITRATE (PF) 100 MCG/2ML IJ SOLN
INTRAMUSCULAR | Status: AC
  Filled 2023-09-06: qty 2

## 2023-09-06 MED ORDER — INSULIN ASPART 100 UNIT/ML IJ SOLN
0.0000 [IU] | Freq: Three times a day (TID) | INTRAMUSCULAR | Status: DC
Start: 2023-09-06 — End: 2023-09-07
  Administered 2023-09-06: 3 [IU] via SUBCUTANEOUS
  Administered 2023-09-07: 4 [IU] via SUBCUTANEOUS
  Administered 2023-09-07: 5 [IU] via SUBCUTANEOUS

## 2023-09-06 MED ORDER — HEPARIN SODIUM (PORCINE) 5000 UNIT/ML IJ SOLN
4000.0000 [IU] | Freq: Once | INTRAMUSCULAR | Status: DC
  Filled 2023-09-06: qty 1

## 2023-09-06 MED ORDER — LABETALOL HCL 5 MG/ML IV SOLN: 10.0000 mg | INTRAVENOUS | Status: AC | PRN

## 2023-09-06 MED ORDER — SODIUM CHLORIDE 0.9 % IV SOLN: 250.0000 mL | INTRAVENOUS | Status: DC | PRN

## 2023-09-06 MED ORDER — HEPARIN SODIUM (PORCINE) 1000 UNIT/ML IJ SOLN
INTRAMUSCULAR | Status: AC
  Filled 2023-09-06: qty 10

## 2023-09-06 MED ORDER — SODIUM CHLORIDE 0.9% FLUSH: 3.0000 mL | INTRAVENOUS | Status: DC | PRN

## 2023-09-06 MED ORDER — MORPHINE SULFATE (PF) 4 MG/ML IV SOLN
4.0000 mg | Freq: Once | INTRAVENOUS | Status: AC
  Administered 2023-09-06: 4 mg via INTRAVENOUS

## 2023-09-06 MED ORDER — ACETAMINOPHEN 325 MG PO TABS: 650.0000 mg | ORAL_TABLET | ORAL | Status: DC | PRN

## 2023-09-06 MED ORDER — ORAL CARE MOUTH RINSE: 15.0000 mL | OROMUCOSAL | Status: DC | PRN

## 2023-09-06 MED ORDER — HEPARIN SODIUM (PORCINE) 5000 UNIT/ML IJ SOLN
5000.0000 [IU] | Freq: Three times a day (TID) | INTRAMUSCULAR | Status: DC
  Administered 2023-09-06 – 2023-09-07 (×2): 5000 [IU] via SUBCUTANEOUS
  Filled 2023-09-06 (×2): qty 1

## 2023-09-06 MED ORDER — LISINOPRIL-HYDROCHLOROTHIAZIDE 20-12.5 MG PO TABS: 2.0000 | ORAL_TABLET | Freq: Every day | ORAL | Status: DC

## 2023-09-06 MED ORDER — HEPARIN SODIUM (PORCINE) 1000 UNIT/ML IJ SOLN
INTRAMUSCULAR | Status: DC | PRN
  Administered 2023-09-06: 7000 [IU] via INTRAVENOUS

## 2023-09-06 MED ORDER — MORPHINE SULFATE (PF) 4 MG/ML IV SOLN: 4.0000 mg | Freq: Once | INTRAVENOUS | Status: DC

## 2023-09-06 MED ORDER — METOPROLOL SUCCINATE ER 50 MG PO TB24
50.0000 mg | ORAL_TABLET | Freq: Every day | ORAL | Status: DC
  Administered 2023-09-07: 50 mg via ORAL
  Filled 2023-09-06: qty 1

## 2023-09-06 MED ORDER — CHLORHEXIDINE GLUCONATE CLOTH 2 % EX PADS
6.0000 | MEDICATED_PAD | Freq: Every day | CUTANEOUS | Status: DC
Start: 2023-09-06 — End: 2023-09-07
  Administered 2023-09-06: 6 via TOPICAL

## 2023-09-06 MED ORDER — MIDAZOLAM HCL 2 MG/2ML IJ SOLN
INTRAMUSCULAR | Status: AC
  Filled 2023-09-06: qty 2

## 2023-09-06 MED ORDER — SODIUM CHLORIDE 0.9 % IV SOLN: INTRAVENOUS | Status: AC

## 2023-09-06 MED ORDER — SODIUM CHLORIDE 0.9% FLUSH
3.0000 mL | Freq: Two times a day (BID) | INTRAVENOUS | Status: DC
Start: 2023-09-06 — End: 2023-09-07
  Administered 2023-09-06 – 2023-09-07 (×2): 3 mL via INTRAVENOUS

## 2023-09-06 SURGICAL SUPPLY — 24 items
BALLN EMERGE MR 2.0X15 (BALLOONS) ×1
BALLN EMERGE MR 2.5X15 (BALLOONS) ×1
BALLN ~~LOC~~ EMERGE MR 2.75X12 (BALLOONS) ×1
BALLOON EMERGE MR 2.0X15 (BALLOONS) IMPLANT
BALLOON EMERGE MR 2.5X15 (BALLOONS) IMPLANT
BALLOON ~~LOC~~ EMERGE MR 2.75X12 (BALLOONS) IMPLANT
CATH 5FR JL3.5 JR4 ANG PIG MP (CATHETERS) IMPLANT
CATH INFINITI AMBI 5FR TG (CATHETERS) IMPLANT
CATH LAUNCHER 6FR EBU 3.75 (CATHETERS) IMPLANT
CATH LAUNCHER 6FR EBU3.5 (CATHETERS) IMPLANT
DEVICE RAD COMP TR BAND LRG (VASCULAR PRODUCTS) IMPLANT
GLIDESHEATH SLEND A-KIT 6F 22G (SHEATH) IMPLANT
GLIDESHEATH SLEND SS 6F .021 (SHEATH) IMPLANT
GUIDEWIRE INQWIRE 1.5J.035X260 (WIRE) IMPLANT
INQWIRE 1.5J .035X260CM (WIRE) ×1
KIT ENCORE 26 ADVANTAGE (KITS) IMPLANT
KIT HEMO VALVE WATCHDOG (MISCELLANEOUS) IMPLANT
KIT SYRINGE INJ CVI SPIKEX1 (MISCELLANEOUS) IMPLANT
PACK CARDIAC CATHETERIZATION (CUSTOM PROCEDURE TRAY) ×2 IMPLANT
SET ATX-X65L (MISCELLANEOUS) IMPLANT
STENT SYNERGY XD 2.50X16 (Permanent Stent) IMPLANT
SYNERGY XD 2.50X16 (Permanent Stent) ×1 IMPLANT
WIRE ASAHI PROWATER 180CM (WIRE) IMPLANT
WIRE MICROINTRODUCER 60CM (WIRE) IMPLANT

## 2023-09-06 NOTE — ED Triage Notes (Signed)
 Pt BIB EMS as CODE STEMI. CP started at 0700 at left side of chest. 324 mg of aspirin. 1 nitro given. Last BP 137 systolic. Axox4.

## 2023-09-06 NOTE — H&P (Signed)
 Cardiology Admission History and Physical   Patient ID: EMARI DEMMER MRN: [id number]; DOB: 1961-02-09   Admission date: 09/06/2023  PCP:  Ransom Other, MD   [facility] Providers Cardiologist:  Jerel Balding, MD   {  Chief Complaint:  Chest pain  History of Present Illness:   Mr. Trefz is a 63 y/o male with hypertension, hyperlipidemia, NIDDM, former smoker, morbid obesity, TAA 4.3 cm, CAD s/p PCI to LAD in 2004, with chest pain.  Patient woke up at 7 AM today with indigestion like symptoms that have persisted and now associated with chest pressure and left arm numbness. EKG showed high lateral ST elevation and inferior ST depression. He is having ongoing chest pain. No other assocaited symptoms noted. BP elevated.    Past Medical History:  Diagnosis Date   Anxiety    Arthritis    Bronchitis    hx of;last time couple of yrs ago   Bronchitis    hx of   CAD (coronary artery disease) 10/10/2013   2004 2.0 x 25 mm drug-eluting cypher stent to LAD    Chronic back pain    L3 buldging disc;DDD   Complication of anesthesia    20-30 second asystole episode during induction  04/2003,  was on b-blocker tx at the time, found to have 80% LAD lesion s/p stent (but unclear if CAD was the direct cause)    Coronary artery disease 2004   stent placement, [facility]   Depression    Diabetes mellitus    takes Glipizide  and Metformin  daily   DM type 2 causing complication (HCC) 10/10/2013   GERD (gastroesophageal reflux disease)    takes Pepcid  daily   Gout    hx of   History of kidney stones    HTN (hypertension) 10/10/2013   Hypercholesteremia    takes Simvastatin  daily   Hypertension    takes Lisinopril  daily   Joint pain    Mixed hyperlipidemia 10/10/2013   Morbid obesity (HCC) 10/10/2013   Nocturia    Peptic ulcer     Past Surgical History:  Procedure Laterality Date   ANTERIOR CERVICAL DECOMP/DISCECTOMY FUSION N/A 07/28/2021    Procedure: Anterior Cervical Decompression Fusion Cervical four-five,Cervical five-six;  Surgeon: Onetha Kuba, MD;  Location: [facility] OR;  Service: Neurosurgery;  Laterality: N/A;   BACK SURGERY     CARDIAC CATHETERIZATION  11/27/2009   widely patent stent   COLONOSCOPY WITH PROPOFOL  N/A 03/17/2015   Procedure: COLONOSCOPY WITH PROPOFOL ;  Surgeon: Gladis MARLA Louder, MD;  Location: [facility];  Service: Endoscopy;  Laterality: N/A;   CORONARY STENT PLACEMENT  05/30/2003   proximal LAD   ESOPHAGOGASTRODUODENOSCOPY  in the 90's   HAND SURGERY  1980   right   LUMBAR DISC SURGERY  2002   L5   LUMBAR FUSION  2004   NM MYOCAR PERF WALL MOTION  05/19/2004   no evidence of ischemia   POSTERIOR CERVICAL FUSION/FORAMINOTOMY N/A 08/11/2021   Procedure: Post Cervical Lami/Multi Level - Cervical four-Cervical five - Cervical five-Cervical six with lateral mass screw fixation;  Surgeon: Onetha Kuba, MD;  Location: [facility] OR;  Service: Neurosurgery;  Laterality: N/A;   TONSILLECTOMY  1969   US  ECHOCARDIOGRAPHY  04/30/2003   mildly dilated aortic root,mild LA enlargement,mild concentric LVH,trivial mitral and tricuspid regurg.,mild mitral annular ca+     Medications Prior to Admission: Prior to Admission medications   Medication Sig Start Date End Date Taking? Authorizing Provider  allopurinol  (ZYLOPRIM ) 100 MG tablet Take 100 mg by mouth daily.     [provider]  aspirin  81 MG tablet Take 81 mg by mouth daily.    [provider]  colchicine  0.6 MG tablet Take 0.6 mg by mouth daily as needed (For gout flares).    [provider]  escitalopram  (LEXAPRO ) 10 MG tablet Take 10 mg by mouth daily. 10/07/13   [provider]  famotidine  (PEPCID ) 20 MG tablet Take 20 mg by mouth daily.     [provider]  fluticasone  (FLONASE ) 50 MCG/ACT nasal spray Place 2 sprays into both nostrils daily as needed for allergies.    [provider]  indomethacin  (INDOCIN ) 50 MG capsule  Take 50 mg by mouth daily as needed (gout flare).    [provider]  lisinopril -hydrochlorothiazide  (ZESTORETIC ) 20-12.5 MG tablet Take 2 tablets by mouth daily. 10/28/21   Croitoru, Mihai, MD  meloxicam  (MOBIC ) 15 MG tablet Take 15 mg by mouth daily.    [provider]  metFORMIN  (GLUCOPHAGE ) 500 MG tablet Take 1,000 mg by mouth 2 (two) times daily with a meal.     [provider]  methocarbamol  (ROBAXIN ) 500 MG tablet Take 1 tablet (500 mg total) by mouth 4 (four) times daily. Patient not taking: Reported on 09/30/2021 08/12/21   Meyran, Kimberly Hannah, NP  metoprolol  succinate (TOPROL -XL) 50 MG 24 hr tablet TAKE 1 TABLET BY MOUTH EVERY DAY 07/19/23   Croitoru, Mihai, MD  Potassium 99 MG TABS Take 99 mg by mouth daily.    [provider]  rosuvastatin  (CRESTOR ) 20 MG tablet Take 1 tablet (20 mg total) by mouth daily. 09/30/21   Croitoru, Mihai, MD  sitaGLIPtin (JANUVIA) 100 MG tablet Take 50 mg by mouth daily.    [provider]     Allergies:    Allergies  Allergen Reactions   Erythromycin Rash    Social History:   Social History   Socioeconomic History   Marital status: Married    Spouse name: Not on file   Number of children: Not on file   Years of education: Not on file   Highest education level: Not on file  Occupational History   Not on file  Tobacco Use   Smoking status: Former    Current packs/day: 0.00    Average packs/day: 0.5 packs/day for 4.0 years (2.0 ttl pk-yrs)    Types: Cigarettes    Start date: 60    Quit date: 42    Years since quitting: 41.0   Smokeless tobacco: Former    Types: Associate Professor status: Never Used  Substance and Sexual Activity   Alcohol use: Yes    Comment: occasionally   Drug use: No   Sexual activity: Yes  Other Topics Concern   Not on file  Social History Narrative   Not on file   Social Drivers of Health   Financial Resource Strain: Not on file  Food Insecurity:  Not on file  Transportation Needs: Not on file  Physical Activity: Not on file  Stress: Not on file  Social Connections: Unknown (01/18/2022)   Received from [facility]   Social Network    Social Network: Not on file  Intimate Partner Violence: Unknown (12/10/2021)   Received from [facility], [facility]   HITS    Physically Hurt: Not on file    Insult or Talk Down To: Not on file  Threaten Physical Harm: Not on file    Scream or Curse: Not on file    Family History:   The patient's family history includes COPD in his brother, brother, and mother; Cancer in his maternal grandfather; Dementia in his maternal grandmother; Heart attack in his father and paternal grandfather; Heart failure in his paternal grandmother; Hypertension in his sister. There is no history of Anesthesia problems, Hypotension, Malignant hyperthermia, or Pseudochol deficiency.    ROS:  Please see the history of present illness.  All other ROS reviewed and negative.     Physical Exam/Data:   Vitals:   09/06/23 1348 09/06/23 1349 09/06/23 1351 09/06/23 1355  BP:    (!) 157/85  Pulse:  93 88 82  Resp:  12 20 13   Temp:      TempSrc:      SpO2:  96% 98% 98%  Weight: 103.4 kg     Height: 5' 7 (1.702 m)      No intake or output data in the 24 hours ending 09/06/23 1403    09/06/2023    1:48 PM 08/11/2021   11:59 AM 07/28/2021    8:31 AM  Last 3 Weights  Weight (lbs) 228 lb 220 lb 220 lb  Weight (kg) 103.42 kg 99.791 kg 99.791 kg     Body mass index is 35.71 kg/m.   Physical Exam Vitals and nursing note reviewed.  Constitutional:      General: He is in acute distress.  Neck:     Vascular: No JVD.  Cardiovascular:     Rate and Rhythm: Normal rate and regular rhythm.     Heart sounds: Normal heart sounds. No murmur heard. Pulmonary:     Effort: Pulmonary effort is normal.     Breath sounds: Normal breath sounds. No wheezing or rales.  Musculoskeletal:     Right lower leg: No  edema.     Left lower leg: No edema.      EKG:  The ECG that was done by EMS was personally reviewed and demonstrates  Sinus rhythm High lateral STEMI  Relevant CV Studies: Echocardiogram 07/2021:  1. Left ventricular ejection fraction, by estimation, is 55%. The left  ventricle has normal function. The left ventricle has no regional wall  motion abnormalities. Left ventricular diastolic parameters are consistent  with Grade I diastolic dysfunction  (impaired relaxation).   2. Right ventricular systolic function is normal. The right ventricular  size is normal.   3. The mitral valve is normal in structure. No evidence of mitral valve  regurgitation.   4. The aortic valve is grossly normal. Aortic valve regurgitation is not  visualized.   5. Aortic dilatation noted. There is mild dilatation of the ascending  aorta, measuring 40 mm.   6. The inferior vena cava is normal in size with greater than 50%  respiratory variability, suggesting right atrial pressure of 3 mmHg.   Comparison(s): LVEF 55%, Aorta 4.3 cm.    Laboratory Data:  High Sensitivity Troponin:  No results for input(s): TROPONINIHS in the last 720 hours.    ChemistryNo results for input(s): NA, K, CL, CO2, GLUCOSE, BUN, CREATININE, CALCIUM , MG, GFRNONAA, GFRAA, ANIONGAP in the last 168 hours.  No results for input(s): PROT, ALBUMIN , AST, ALT, ALKPHOS, BILITOT in the last 168 hours. Lipids No results for input(s): CHOL, TRIG, HDL, LABVLDL, LDLCALC, CHOLHDL in the last 168 hours. HematologyNo results for input(s): WBC, RBC, HGB, HCT, MCV, MCH, MCHC, RDW, PLT in the last 168 hours.  Thyroid No results for input(s): TSH, FREET4 in the last 168 hours. BNPNo results for input(s): BNP, PROBNP in the last 168 hours.  DDimer No results for input(s): DDIMER in the last 168 hours.   Radiology/Studies:  No results found.   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  STEMI: High lateral ST elevation. 6/10 ongoing chest pain. Received ASA 324 mg, 1 SL NTG, 1 dose IV morphine . Will given 4000 U IV heparin  and take him to cath lab for urgent coronary angiography and intervention. Increase Crestor  to 40 mg daily.   Hypertension: On Lisinopril -hydrochlorothiazide  20-12.5 mg daily, met succinate 25 mg daily at home. Will reconcile after cath.  NIDDM: Hold metformin . SSI insulin .    Risk Assessment/Risk Scores:    TIMI Risk Score for ST  Elevation MI:   The patient's TIMI risk score is 4, which indicates a 7.3% risk of all cause mortality at 30 days.{     Code Status: Full Code  Severity of Illness: The appropriate patient status for this patient is INPATIENT. Inpatient status is judged to be reasonable and necessary in order to provide the required intensity of service to ensure the patient's safety. The patient's presenting symptoms, physical exam findings, and initial radiographic and laboratory data in the context of their chronic comorbidities is felt to place them at high risk for further clinical deterioration. Furthermore, it is not anticipated that the patient will be medically stable for discharge from the hospital within 2 midnights of admission.   * I certify that at the point of admission it is my clinical judgment that the patient will require inpatient hospital care spanning beyond 2 midnights from the point of admission due to high intensity of service, high risk for further deterioration and high frequency of surveillance required.*   For questions or updates, please contact [facility] Please consult [email or web address] for contact info under     Signed, [name], MD 09/06/2023 2:03 PM

## 2023-09-06 NOTE — Interval H&P Note (Signed)
 History and Physical Interval Note:  09/06/2023 2:12 PM  Devon Wood  has presented today for surgery, with the diagnosis of STEMI.  The various methods of treatment have been discussed with the patient and family. After consideration of risks, benefits and other options for treatment, the patient has consented to  Procedure(s): Coronary/Graft Acute MI Revascularization (N/A) LEFT HEART CATH AND CORONARY ANGIOGRAPHY (N/A) as a surgical intervention.  The patient's history has been reviewed, patient examined, no change in status, stable for surgery.  I have reviewed the patient's chart and labs.  Questions were answered to the patient's satisfaction.     [name] J Kalicia Dufresne

## 2023-09-06 NOTE — Progress Notes (Signed)
   09/06/23 1451  Spiritual Encounters  Type of Visit Initial  Care provided to: Patient  Conversation partners present during encounter Nurse  Reason for visit Code  OnCall Visit Yes   Chaplain responding to Code STEMI. Pt being taken to Cath Lab.  RN reports that wife might be in waiting area.  Chaplain checked the waiting area and wife was not present.  Left word with security desk to call Spiritual Care when she arrives and we will escort her to [facility] waiting area.   Chaplain services remain available by Spiritual Consult or for emergent cases, paging [phone] Chaplain Maude Roll, MDiv [email or web address] [phone]

## 2023-09-06 NOTE — ED Provider Notes (Signed)
  EMERGENCY DEPARTMENT AT [facility] Provider Note   CSN: [id number] Arrival date & time: 09/06/23  1344     History  Chief Complaint  Patient presents with   Code STEMI    Devon Wood is a 63 y.o. male.  63 yo M with a chief complaints of chest pain.  He woke up from sleep with this this morning.  Feeling indigestion and did not seem to give way.  About 2 hours ago he had worsening of his symptoms like his rating down his left arm.  He ended up calling EMS.  Found to have a ST elevation MI and was taken to the ED.  He has a history of a stent placement in the past.  At that time he was asymptomatic and it was found on a preop workup.  He is on medication for high blood pressure high cholesterol and does not think he is on anything for diabetes.  Denies smoking.        Home Medications Prior to Admission medications   Medication Sig Start Date End Date Taking? Authorizing Provider  allopurinol  (ZYLOPRIM ) 100 MG tablet Take 100 mg by mouth daily.     [provider]  aspirin  81 MG tablet Take 81 mg by mouth daily.    [provider]  colchicine  0.6 MG tablet Take 0.6 mg by mouth daily as needed (For gout flares).    [provider]  escitalopram  (LEXAPRO ) 10 MG tablet Take 10 mg by mouth daily. 10/07/13   [provider]  famotidine  (PEPCID ) 20 MG tablet Take 20 mg by mouth daily.     [provider]  fluticasone  (FLONASE ) 50 MCG/ACT nasal spray Place 2 sprays into both nostrils daily as needed for allergies.    [provider]  indomethacin  (INDOCIN ) 50 MG capsule Take 50 mg by mouth daily as needed (gout flare).    [provider]  lisinopril -hydrochlorothiazide  (ZESTORETIC ) 20-12.5 MG tablet Take 2 tablets by mouth daily. 10/28/21   Wood, Mihai, MD  meloxicam  (MOBIC ) 15 MG tablet Take 15 mg by mouth daily.    [provider]  metFORMIN  (GLUCOPHAGE ) 500 MG tablet Take 1,000 mg by mouth  2 (two) times daily with a meal.     [provider]  methocarbamol  (ROBAXIN ) 500 MG tablet Take 1 tablet (500 mg total) by mouth 4 (four) times daily. Patient not taking: Reported on 09/30/2021 08/12/21   Wood, Devon Hannah, NP  metoprolol  succinate (TOPROL -XL) 50 MG 24 hr tablet TAKE 1 TABLET BY MOUTH EVERY DAY 07/19/23   Wood, Mihai, MD  Potassium 99 MG TABS Take 99 mg by mouth daily.    [provider]  rosuvastatin  (CRESTOR ) 20 MG tablet Take 1 tablet (20 mg total) by mouth daily. 09/30/21   Wood, Mihai, MD  sitaGLIPtin (JANUVIA) 100 MG tablet Take 50 mg by mouth daily.    [provider]      Allergies    Erythromycin    Review of Systems   Review of Systems  Physical Exam Updated Vital Signs BP (!) 157/85   Pulse 82   Temp 98.3 F (36.8 C) (Temporal)   Resp 13   Ht 5' 7 (1.702 m)   Wt 103.4 kg   SpO2 98%   BMI 35.71 kg/m  Physical Exam Vitals and nursing note reviewed.  Constitutional:      Appearance: He is well-developed.  HENT:     Head: Normocephalic and atraumatic.  Eyes:     Pupils: Pupils are equal, round, and reactive to light.  Neck:     Vascular: No JVD.  Cardiovascular:     Rate and Rhythm: Normal rate and regular rhythm.     Heart sounds: No murmur heard.    No friction rub. No gallop.  Pulmonary:     Effort: No respiratory distress.     Breath sounds: No wheezing.  Abdominal:     General: There is no distension.     Tenderness: There is no abdominal tenderness. There is no guarding or rebound.  Musculoskeletal:        General: Normal range of motion.     Cervical back: Normal range of motion and neck supple.  Skin:    Coloration: Skin is not pale.     Findings: No rash.  Neurological:     Mental Status: He is alert and oriented to person, place, and time.  Psychiatric:        Behavior: Behavior normal.     ED Results / Procedures / Treatments   Labs (all labs ordered are listed, but only abnormal  results are displayed) Labs Reviewed  I-STAT CG4 LACTIC ACID, ED - Abnormal; Notable for the following components:      Result Value   Lactic Acid, Venous 3.5 (*)    All other components within normal limits  CBC WITH DIFFERENTIAL/PLATELET  PROTIME-INR  APTT  COMPREHENSIVE METABOLIC PANEL  LIPID PANEL  TROPONIN I (HIGH SENSITIVITY)    EKG EKG Interpretation Date/Time:  Wednesday September 06 2023 13:48:52 EST Ventricular Rate:  91 PR Interval:  192 QRS Duration:  118 QT Interval:  390 QTC Calculation: 480 R Axis:   -75  Text Interpretation: Sinus rhythm Atrial premature complex Left anterior fascicular block lateral stemi Confirmed by Emil Share [phone]) on 09/06/2023 1:56:29 PM  Radiology No results found.  Procedures .Critical Care  Performed by: Emil Share, DO Authorized by: Emil Share, DO   Critical care provider statement:    Critical care time (minutes):  35   Critical care time was exclusive of:  Separately billable procedures and treating other patients   Critical care was time spent personally by me on the following activities:  Development of treatment plan with patient or surrogate, discussions with consultants, evaluation of patient's response to treatment, examination of patient, ordering and review of laboratory studies, ordering and review of radiographic studies, ordering and performing treatments and interventions, pulse oximetry, re-evaluation of patient's condition and review of old charts   Care discussed with: admitting provider       Medications Ordered in ED Medications  heparin  injection 4,000 Units (has no administration in time range)  morphine  (PF) 4 MG/ML injection 4 mg (4 mg Intravenous Given 09/06/23 1357)    ED Course/ Medical Decision Making/ A&P                                 Medical Decision Making Amount and/or Complexity of Data Reviewed Labs: ordered.  Risk Prescription drug management.   63 yo M with a chief complaint of  chest pain.  This started when he woke up this morning.  Got a bit worse a couple hours ago.  Patient's EKG is concerning for a lateral ST elevation MI.  Has reciprocal changes in the inferior leads.  Not as found on initial EKG here.  Seen by cardiology at bedside.  Will take to  the Cath Lab.  The patients results and plan were reviewed and discussed.   Any x-rays performed were independently reviewed by myself.   Differential diagnosis were considered with the presenting HPI.  Medications  heparin  injection 4,000 Units (has no administration in time range)  morphine  (PF) 4 MG/ML injection 4 mg (4 mg Intravenous Given 09/06/23 1357)    Vitals:   09/06/23 1348 09/06/23 1349 09/06/23 1351 09/06/23 1355  BP:    (!) 157/85  Pulse:  93 88 82  Resp:  12 20 13   Temp:      TempSrc:      SpO2:  96% 98% 98%  Weight: 103.4 kg     Height: 5' 7 (1.702 m)       Final diagnoses:  Acute ST elevation myocardial infarction (STEMI), unspecified artery (HCC)    Admission/ observation were discussed with the admitting physician, patient and/or family and they are comfortable with the plan.          Final Clinical Impression(s) / ED Diagnoses Final diagnoses:  Acute ST elevation myocardial infarction (STEMI), unspecified artery [facility])    Rx / DC Orders ED Discharge Orders     None         Emil Share, DO 09/06/23 1408

## 2023-09-07 ENCOUNTER — Telehealth: Payer: Self-pay | Admitting: Cardiology

## 2023-09-07 ENCOUNTER — Other Ambulatory Visit (HOSPITAL_COMMUNITY): Payer: Self-pay

## 2023-09-07 ENCOUNTER — Telehealth: Payer: Self-pay

## 2023-09-07 ENCOUNTER — Encounter (HOSPITAL_COMMUNITY): Payer: Self-pay | Admitting: Cardiology

## 2023-09-07 ENCOUNTER — Inpatient Hospital Stay (HOSPITAL_COMMUNITY): Payer: 59

## 2023-09-07 DIAGNOSIS — N183 Chronic kidney disease, stage 3 unspecified: Secondary | ICD-10-CM | POA: Insufficient documentation

## 2023-09-07 DIAGNOSIS — R079 Chest pain, unspecified: Secondary | ICD-10-CM

## 2023-09-07 DIAGNOSIS — I213 ST elevation (STEMI) myocardial infarction of unspecified site: Secondary | ICD-10-CM

## 2023-09-07 DIAGNOSIS — I7121 Aneurysm of the ascending aorta, without rupture: Secondary | ICD-10-CM

## 2023-09-07 DIAGNOSIS — I77810 Thoracic aortic ectasia: Secondary | ICD-10-CM

## 2023-09-07 DIAGNOSIS — Z006 Encounter for examination for normal comparison and control in clinical research program: Secondary | ICD-10-CM

## 2023-09-07 HISTORY — DX: Thoracic aortic ectasia: I77.810

## 2023-09-07 HISTORY — DX: Chronic kidney disease, stage 3 unspecified: N18.30

## 2023-09-07 LAB — ECHOCARDIOGRAM COMPLETE
AR max vel: 2.94 cm2
AV Area VTI: 3.29 cm2
AV Area mean vel: 2.97 cm2
AV Mean grad: 2 mm[Hg]
AV Peak grad: 4 mm[Hg]
Ao pk vel: 1 m/s
Area-P 1/2: 4.26 cm2
Calc EF: 46.1 %
Est EF: 50
Height: 67 in
S' Lateral: 2 cm
Single Plane A2C EF: 42.1 %
Single Plane A4C EF: 52.2 %
Weight: 3800 [oz_av]

## 2023-09-07 LAB — GLUCOSE, CAPILLARY
Glucose-Capillary: 154 mg/dL — ABNORMAL HIGH (ref 70–99)
Glucose-Capillary: 224 mg/dL — ABNORMAL HIGH (ref 70–99)

## 2023-09-07 LAB — CBC
HCT: 34.5 % — ABNORMAL LOW (ref 39.0–52.0)
Hemoglobin: 11.9 g/dL — ABNORMAL LOW (ref 13.0–17.0)
MCH: 30.1 pg (ref 26.0–34.0)
MCHC: 34.5 g/dL (ref 30.0–36.0)
MCV: 87.1 fL (ref 80.0–100.0)
Platelets: 222 10*3/uL (ref 150–400)
RBC: 3.96 MIL/uL — ABNORMAL LOW (ref 4.22–5.81)
RDW: 12.1 % (ref 11.5–15.5)
WBC: 11.7 10*3/uL — ABNORMAL HIGH (ref 4.0–10.5)
nRBC: 0 % (ref 0.0–0.2)

## 2023-09-07 LAB — BASIC METABOLIC PANEL
Anion gap: 10 (ref 5–15)
BUN: 30 mg/dL — ABNORMAL HIGH (ref 8–23)
CO2: 18 mmol/L — ABNORMAL LOW (ref 22–32)
Calcium: 8.6 mg/dL — ABNORMAL LOW (ref 8.9–10.3)
Chloride: 102 mmol/L (ref 98–111)
Creatinine, Ser: 2.4 mg/dL — ABNORMAL HIGH (ref 0.61–1.24)
GFR, Estimated: 30 mL/min — ABNORMAL LOW (ref >60)
Glucose, Bld: 177 mg/dL — ABNORMAL HIGH (ref 70–99)
Potassium: 4.4 mmol/L (ref 3.5–5.1)
Sodium: 130 mmol/L — ABNORMAL LOW (ref 135–145)

## 2023-09-07 LAB — HEMOGLOBIN A1C
Hgb A1c MFr Bld: 9.5 % — ABNORMAL HIGH (ref 4.8–5.6)
Mean Plasma Glucose: 226 mg/dL

## 2023-09-07 MED ORDER — AMLODIPINE BESYLATE 10 MG PO TABS
10.0000 mg | ORAL_TABLET | Freq: Every day | ORAL | 1 refills | Status: DC
  Filled 2023-09-07: qty 30, 30d supply, fill #0
  Filled 2023-09-07: qty 90, 90d supply, fill #0
  Filled 2023-10-03: qty 30, 30d supply, fill #0

## 2023-09-07 MED ORDER — TICAGRELOR 90 MG PO TABS
90.0000 mg | ORAL_TABLET | Freq: Two times a day (BID) | ORAL | 2 refills | Status: DC
  Filled 2023-09-07: qty 60, 30d supply, fill #0

## 2023-09-07 MED ORDER — NITROGLYCERIN 0.4 MG SL SUBL
0.4000 mg | SUBLINGUAL_TABLET | SUBLINGUAL | 2 refills | Status: DC | PRN
  Filled 2023-09-07: qty 25, 5d supply, fill #0
  Filled 2023-09-07: qty 25, 30d supply, fill #0

## 2023-09-07 MED ORDER — EZETIMIBE 10 MG PO TABS
10.0000 mg | ORAL_TABLET | Freq: Every day | ORAL | 1 refills | Status: DC
  Filled 2023-09-07: qty 30, 30d supply, fill #0
  Filled 2023-09-07: qty 90, 90d supply, fill #0
  Filled 2023-10-03: qty 30, 30d supply, fill #0

## 2023-09-07 MED ORDER — EZETIMIBE 10 MG PO TABS
10.0000 mg | ORAL_TABLET | Freq: Every day | ORAL | Status: DC
  Administered 2023-09-07: 10 mg via ORAL
  Filled 2023-09-07: qty 1

## 2023-09-07 MED ORDER — STUDY - ARTEMIS - ZILTIVEKIMAB 15 MG/0.5 ML OR PLACEBO SQ INJECTION (PI-CHRISTOPHER)
30.0000 mg | INJECTION | Freq: Once | SUBCUTANEOUS | Status: AC
  Administered 2023-09-07: 30 mg via SUBCUTANEOUS
  Filled 2023-09-07: qty 1

## 2023-09-07 MED ORDER — PERFLUTREN LIPID MICROSPHERE
1.0000 mL | INTRAVENOUS | Status: AC | PRN
  Administered 2023-09-07: 3 mL via INTRAVENOUS

## 2023-09-07 MED ORDER — AMLODIPINE BESYLATE 10 MG PO TABS
10.0000 mg | ORAL_TABLET | Freq: Every day | ORAL | Status: DC
  Administered 2023-09-07: 10 mg via ORAL
  Filled 2023-09-07: qty 1

## 2023-09-07 MED ORDER — EMPAGLIFLOZIN 10 MG PO TABS
10.0000 mg | ORAL_TABLET | Freq: Every day | ORAL | 2 refills | Status: DC
  Filled 2023-09-07: qty 30, 30d supply, fill #0

## 2023-09-07 MED ORDER — ROSUVASTATIN CALCIUM 5 MG PO TABS
10.0000 mg | ORAL_TABLET | Freq: Every day | ORAL | Status: DC
  Administered 2023-09-07: 10 mg via ORAL
  Filled 2023-09-07: qty 2

## 2023-09-07 NOTE — Discharge Summary (Signed)
 Discharge Summary    Patient ID: Devon Wood MRN: [id number]; DOB: [birth year]  Admit date: 09/06/2023 Discharge date: 09/07/2023  PCP:  Ransom Other, MD   [facility] Providers Cardiologist:  Jerel Balding, MD     Discharge Diagnoses    Principal Problem:   STEMI (ST elevation myocardial infarction) [facility]) Active Problems:   Mixed hyperlipidemia   DM type 2 causing complication (HCC)   Essential hypertension   CKD (chronic kidney disease), stage III (HCC)   Diagnostic Studies/Procedures    Cath: 09/06/2023    Recommend uninterrupted dual antiplatelet therapy with Aspirin  81mg  daily and Ticagrelor  90mg  twice daily for a minimum of 12 months (ACS-Class I recommendation).   Coronary intervention 09/06/2023: LM: Normal LAD: Proximal LAD stent with 40% late lumen loss.         Diffuse mid LAD 60% disease, followed by focal 80% stenosis in mid-distal LAD. Ramus intermedius: Proximal 99% thrombotic stenosis (culprit lesion)                                  Lateral ramus branch with proximal 50% stenosis LCx: Small OM1 with ostial 80% stenosis         Moderate sized OM2 with diffuse 70% disease RCA: Ostial/proximal RPDA diffuse 90% stenosis          RPL 1 proximal 80% stenosis            Successful percutaneous coronary intervention prox ramus        PTCA and stent placement 2.5 X 16 mm Synergy drug-eluting stent         Post dilatation using 2.75X12 mm [place] balloon up to 16 atm        TIMI flow I-->III        Culprit stenosis 99%-->0%      There are residual lesions in ostial/proximal right PDA (90%), RPL 1 (80%), Small OM1 (ostial 80%), medium sized OM2 (70%)as well as mid to distal LAD (60-80%).  Most of these lesions are not ideal for PCI given ostial location of the PDA lesion with size mismatch compared to distal RCA, and other diffuse nature of mid LAD disease proximal to the focal 80% lesion.  We will assess the patient for angina symptoms with ambulation before  deciding on whether he would need nonculprit artery revascularization.   [name], MD  Echo: 09/07/2023  Pending official read, reviewed by MD prior to discharge  _____________   History of Present Illness     Devon Wood is a 63 y.o. 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   Patient woke up at 7 AM the morning of admission with indigestion like symptoms that have persisted and now associated with chest pressure and left arm numbness. EKG showed high lateral ST elevation and inferior ST depression. He was having ongoing chest pain at the time of assessment. No other assocaited symptoms noted. BP elevated. Taken to the cath lab for emergent cardiac cath.   Hospital Course     STEMI -- Underwent cardiac catheterization noted above with proximal 99% thrombotic stenosis, culprit lesion in ramus intermedius treated with PCI/DES x 1.  Does have residual disease in ostial/proximal right PDA of 90%, RPL 180%, small OM 80%, OM2 70% as well as mid to distal LAD of 60 to 80%.  It  was felt these lesions were not ideal for PCI and recommendations will be for medical management.  Placed on DAPT with aspirin /Brilinta  for at least 1 year.  -- Echocardiogram pending official read, but reviewed by MD prior to discharge  -- Continue aspirin , Brilinta , Crestor  10 mg daily, metoprolol  succinate 50 mg daily  Hypertension -- Controlled -- Continue metoprolol  succinate 50 mg daily, stop lisinopril /hydrochlorothiazide  given electrolyte abnormalities and CKD -- Start amlodipine  10 mg daily  Hyperlipidemia -- LDL 75, HDL 32 -- On Crestor  10 mg daily, has been intolerant to higher doses secondary to myalgias -- add Zetia  10 mg daily -- will need FLP/LFTs in 8 weeks   CKD stage IIIb -- Cr noted at 2.24 on admission, 2.40 at discharge -- as above, stop ACE/hydrochlorothiazide  -- recheck BMET at follow  up  Diabetes -- Hgb A1c 9.5 -- resume metformin  at discharge -- add jardiance  10mg  daily   Patient agreeable to participate in artemis and questions answered prior to discharge.   General: Well developed, well nourished, male appearing in no acute distress. Head: Normocephalic, atraumatic.  Neck: Supple without bruits, JVD. Lungs:  Resp regular and unlabored, CTA. Heart: RRR, S1, S2, no S3, S4, or murmur; no rub. Abdomen: Soft, non-tender, non-distended with normoactive bowel sounds. No hepatomegaly. No rebound/guarding. No obvious abdominal masses. Extremities: No clubbing, cyanosis, edema. Distal pedal pulses are 2+ bilaterally. Right radial cath site stable without bruising or hematoma Neuro: Alert and oriented X 3. Moves all extremities spontaneously. Psych: Normal affect.  [place] Class I NYHA Class I  Patient seen by Dr. Elmira and deemed stable for discharge home. Follow up arranged in the office. Medications sent to [facility] pharmacy prior to DC.   Did the patient have an acute coronary syndrome (MI, NSTEMI, STEMI, etc) this admission?:  Yes                               AHA/ACC ACS Clinical Performance & Quality Measures: Aspirin  prescribed? - Yes ADP Receptor Inhibitor (Plavix /Clopidogrel , Brilinta /Ticagrelor  or Effient/Prasugrel) prescribed (includes medically managed patients)? - Yes Beta Blocker prescribed? - Yes High Intensity Statin (Lipitor 40-80mg  or Crestor  20-40mg ) prescribed? - Yes EF assessed during THIS hospitalization? - Yes For EF <40%, was ACEI/ARB prescribed? - Not Applicable (EF >/= 40%) For EF <40%, Aldosterone Antagonist (Spironolactone or Eplerenone) prescribed? - Not Applicable (EF >/= 40%) Cardiac Rehab Phase II ordered (including medically managed patients)? - Yes   The patient will be scheduled for a TOC follow up appointment in 10-14 days.  A message has been sent to the [facility] and Scheduling Pool at the office where the patient should be seen  for follow up.  _____________  Discharge Vitals Blood pressure 126/78, pulse 84, temperature 99.2 F (37.3 C), temperature source Oral, resp. rate 16, height 5' 7 (1.702 m), weight 107.7 kg, SpO2 99%.  Filed Weights   09/06/23 1348 09/07/23 0004  Weight: 103.4 kg 107.7 kg    Labs & Radiologic Studies    CBC Recent Labs    09/06/23 1355 09/06/23 1503 09/06/23 1616 09/07/23 0422  WBC 9.1  --  12.7* 11.7*  NEUTROABS 6.2  --   --   --   HGB 12.8*   < > 12.5* 11.9*  HCT 38.0*   < > 36.4* 34.5*  MCV 88.0  --  87.1 87.1  PLT 250  --  259 222   < > = values in this  interval not displayed.   Basic Metabolic Panel Recent Labs    98/98/74 1355 09/06/23 1503 09/06/23 1606 09/07/23 0422  NA 131* 108*  --  130*  K 4.6 4.1  --  4.4  CL 101 75*  --  102  CO2 20*  --   --  18*  GLUCOSE 235* 138*  --  177*  BUN 28* 28*  --  30*  CREATININE 2.24* 1.10 2.12* 2.40*  CALCIUM  9.4  --   --  8.6*   Liver Function Tests Recent Labs    09/06/23 1355  AST 27  ALT 23  ALKPHOS 57  BILITOT 0.4  PROT 7.9  ALBUMIN  4.4   No results for input(s): LIPASE, AMYLASE in the last 72 hours. High Sensitivity Troponin:   Recent Labs  Lab 09/06/23 1355 09/06/23 1606  TROPONINIHS 62* 4,577*    BNP Invalid input(s): POCBNP D-Dimer No results for input(s): DDIMER in the last 72 hours. Hemoglobin A1C Recent Labs    09/06/23 1606  HGBA1C 9.5*   Fasting Lipid Panel Recent Labs    09/06/23 1355  CHOL 149  HDL 32*  LDLCALC 75  TRIG 787*  CHOLHDL 4.7   Thyroid Function Tests No results for input(s): TSH, T4TOTAL, T3FREE, THYROIDAB in the last 72 hours.  Invalid input(s): FREET3 _____________  CARDIAC CATHETERIZATION Result Date: 09/06/2023   Recommend uninterrupted dual antiplatelet therapy with Aspirin  81mg  daily and Ticagrelor  90mg  twice daily for a minimum of 12 months (ACS-Class I recommendation). Coronary intervention 09/06/2023: LM: Normal LAD: Proximal LAD  stent with 40% late lumen loss.         Diffuse mid LAD 60% disease, followed by focal 80% stenosis in mid-distal LAD. Ramus intermedius: Proximal 99% thrombotic stenosis (culprit lesion)                                  Lateral ramus branch with proximal 50% stenosis LCx: Small OM1 with ostial 80% stenosis         Moderate sized OM2 with diffuse 70% disease RCA: Ostial/proximal RPDA diffuse 90% stenosis          RPL 1 proximal 80% stenosis         Successful percutaneous coronary intervention prox ramus        PTCA and stent placement 2.5 X 16 mm Synergy drug-eluting stent        Post dilatation using 2.75X12 mm [place] balloon up to 16 atm        TIMI flow I-->III        Culprit stenosis 99%-->0% There are residual lesions in ostial/proximal right PDA (90%), RPL 1 (80%), Small OM1 (ostial 80%), medium sized OM2 (70%)as well as mid to distal LAD (60-80%).  Most of these lesions are not ideal for PCI given ostial location of the PDA lesion with size mismatch compared to distal RCA, and other diffuse nature of mid LAD disease proximal to the focal 80% lesion.  We will assess the patient for angina symptoms with ambulation before deciding on whether he would need nonculprit artery revascularization. Manish JINNY Lawrence, MD  Disposition   Pt is being discharged home today in good condition.  Follow-up Plans & Appointments     Discharge Instructions     Amb Referral to Cardiac Rehabilitation   Complete by: As directed    Diagnosis:  Coronary Stents STEMI     After initial evaluation and assessments  completed: Virtual Based Care may be provided alone or in conjunction with Phase 2 Cardiac Rehab based on patient barriers.: Yes   Intensive Cardiac Rehabilitation (ICR) [facility] location only OR Traditional Cardiac Rehabilitation (TCR) *If criteria for ICR are not met will enroll in TCR [facility] only): Yes   Call MD for:  difficulty breathing, headache or visual disturbances   Complete by: As directed    Call MD for:   persistant dizziness or light-headedness   Complete by: As directed    Call MD for:  redness, tenderness, or signs of infection (pain, swelling, redness, odor or green/yellow discharge around incision site)   Complete by: As directed    Diet - low sodium heart healthy   Complete by: As directed    Discharge instructions   Complete by: As directed    Radial Site Care Refer to this sheet in the next few weeks. These instructions provide you with information on caring for yourself after your procedure. Your caregiver may also give you more specific instructions. Your treatment has been planned according to current medical practices, but problems sometimes occur. Call your caregiver if you have any problems or questions after your procedure. HOME CARE INSTRUCTIONS You may shower the day after the procedure. Remove the bandage (dressing) and gently wash the site with plain soap and water. Gently pat the site dry.  Do not apply powder or lotion to the site.  Do not submerge the affected site in water for 3 to 5 days.  Inspect the site at least twice daily.  Do not flex or bend the affected arm for 24 hours.  No lifting over 5 pounds (2.3 kg) for 5 days after your procedure.  Do not drive home if you are discharged the same day of the procedure. Have someone else drive you.  You may drive 24 hours after the procedure unless otherwise instructed by your caregiver.  What to expect: Any bruising will usually fade within 1 to 2 weeks.  Blood that collects in the tissue (hematoma) may be painful to the touch. It should usually decrease in size and tenderness within 1 to 2 weeks.  SEEK IMMEDIATE MEDICAL CARE IF: You have unusual pain at the radial site.  You have redness, warmth, swelling, or pain at the radial site.  You have drainage (other than a small amount of blood on the dressing).  You have chills.  You have a fever or persistent symptoms for more than 72 hours.  You have a fever and your  symptoms suddenly get worse.  Your arm becomes pale, cool, tingly, or numb.  You have heavy bleeding from the site. Hold pressure on the site.   PLEASE DO NOT MISS ANY DOSES OF YOUR BRILINTA !!!!! Also keep a log of you blood pressures and bring back to your follow up appt. Please call the office with any questions.   Patients taking blood thinners should generally stay away from medicines like ibuprofen , Advil , Motrin , naproxen , and Aleve  due to risk of stomach bleeding. You may take Tylenol  as directed or talk to your primary doctor about alternatives.   PLEASE ENSURE THAT YOU DO NOT RUN OUT OF YOUR BRILINTA . This medication is very important to remain on for at least one year. IF you have issues obtaining this medication due to cost please CALL the office 3-5 business days prior to running out in order to prevent missing doses of this medication.   Increase activity slowly   Complete by: As directed  Discharge Medications   Allergies as of 09/07/2023       Reactions   Erythromycin Rash        Medication List     STOP taking these medications    lisinopril -hydrochlorothiazide  20-12.5 MG tablet Commonly known as: ZESTORETIC        TAKE these medications    amLODipine  10 MG tablet Commonly known as: NORVASC  Take 1 tablet (10 mg total) by mouth daily. Start taking on: September 08, 2023   ARTEMIS ziltivekimab or placebo 15 mg/0.5 mL SQ injection Inject 15 mg into the skin every 28 (twenty-eight) days. For Investigational Use Only. May be administered in the thigh, abdomen, or upper arm, at any time of day irrespective of meals. Injections are to be administered on a flat skin surface. Should be injected once monthly on the same day of the month throughout the treatment period (scheduled dosing day)   aspirin  81 MG tablet Take 81 mg by mouth daily.   COQ10 PO Take 1 tablet by mouth daily.   empagliflozin  10 MG Tabs tablet Commonly known as: Jardiance  Take 1  tablet (10 mg total) by mouth daily before breakfast.   escitalopram  10 MG tablet Commonly known as: LEXAPRO  Take 10 mg by mouth daily.   ezetimibe  10 MG tablet Commonly known as: ZETIA  Take 1 tablet (10 mg total) by mouth daily. Start taking on: September 08, 2023   famotidine  20 MG tablet Commonly known as: PEPCID  Take 20 mg by mouth daily.   fluticasone  50 MCG/ACT nasal spray Commonly known as: FLONASE  Place 2 sprays into both nostrils daily as needed for allergies.   meloxicam  15 MG tablet Commonly known as: MOBIC  Take 15 mg by mouth daily.   metFORMIN  500 MG tablet Commonly known as: GLUCOPHAGE  Take 1,000 mg by mouth 2 (two) times daily with a meal.   metoprolol  succinate 50 MG 24 hr tablet Commonly known as: TOPROL -XL TAKE 1 TABLET BY MOUTH EVERY DAY   nitroGLYCERIN  0.4 MG SL tablet Commonly known as: Nitrostat  Place 1 tablet (0.4 mg total) under the tongue every 5 (five) minutes as needed.   rosuvastatin  20 MG tablet Commonly known as: CRESTOR  Take 1 tablet (20 mg total) by mouth daily. What changed: how much to take   sitaGLIPtin 100 MG tablet Commonly known as: JANUVIA Take 50 mg by mouth daily.   ticagrelor  90 MG Tabs tablet Commonly known as: BRILINTA  Take 1 tablet (90 mg total) by mouth 2 (two) times daily.         Outstanding Labs/Studies   FLP/LFTs in 8 weeks  BMET at follow up appt  Duration of Discharge Encounter   Greater than 30 minutes including physician time.  Signed, Manuelita Rummer, NP 09/07/2023, 12:18 PM

## 2023-09-07 NOTE — Progress Notes (Signed)
 CARDIAC REHAB PHASE I   PRE:  Rate/Rhythm: 88 SR      SaO2: 96 RA  MODE:  Ambulation: 240 ft   POST:  Rate/Rhythm: 101 ST      SaO2: 98 RA  Pt ambulated independently in hallway. Tolerated well with no CP,SOB or dizziness. Returned to bed with call bell and bedside table in reach. Post MI/stent education including restrictions, risk factors, exercise guidelines, antiplatelet therapy importance, MI booklet, NTG use, heart healthy diabetic diet and CRP2 reviewed. All questions and concerns addressed. Will refer to [facility] for CRP2. Plan for homer later today.   [id number] Devon Asberry Hacking, RN BSN 09/07/2023 10:24 AM

## 2023-09-07 NOTE — TOC CM/SW Note (Signed)
 Transition of Care [facility]) - Inpatient Brief Assessment   Patient Details  Name: Devon Wood MRN: [id number] Date of Birth: 1961/05/25  Transition of Care [facility]) CM/SW Contact:    Devon Erminio Deems, RN Phone Number: 09/07/2023, 12:49 PM   Clinical Narrative: Patient presented for Stemi-post PCI. Plan for home today on Brilinta . Patient has PCP and states his insurance will be active in February. Case Manager was able to utilize [facility] for this patient and medications will be filled via [facility]. Patient states he will have transportation home.     [employer] Pharmacies please call [phone] for claim processing assistance.  Rx BIN: [id number] Rx Group: [id number] Rx PCN: [id number] Relationship Code: 1 Person Code: [id number]  Patient ID (MRN): Devon Wood    Patient Name: Devon Wood   Patient DOB: 10/24/1960   Discharge Date: 09-07-23   Expiration Date: 09-15-23 (must be filled within 7 days of discharge)     Transition of Care Asessment: Insurance and Status: Insurance coverage has been reviewed Patient has primary care physician: Yes Prior level of function:: independent Prior/Current Home Services: No current home services Social Drivers of Health Review: SDOH reviewed no interventions necessary Readmission risk has been reviewed: Yes Transition of care needs: no transition of care needs at this time

## 2023-09-07 NOTE — Research (Addendum)
 ARTEMIS Screening/Randomization Visit (V1/V2)  Subject Name: Devon Wood  Subject met inclusion and exclusion criteria.  The informed consent form, study requirements and expectations were reviewed with the subject and questions and concerns were addressed prior to the signing of the consent form.  The subject verbalized understanding of the trial requirements.  The subject agreed to participate in the Artemis trial and signed the informed consent on 02/Jan/2025.  The informed consent was obtained prior to performance of any protocol-specific procedures for the subject.  A copy of the signed informed consent was given to the subject and a copy was placed in the subject's medical record.    Were all inclusion criteria met? [x] Yes [] No  Informed consent obtained before any study-related activities. Study-related activities are any  procedures that are carried out as part of the study, including activities to determine suitability  for the study. [x] Yes [] No 2.  Age 62 years or above at the time of signing the informed consent. [x] Yes [] No  3.  Hospitalizations for acute myocardial infarction with evidence of type 1 MIb,13 by invasive  angiography performed at site with PCI capabilities. [x] Yes [] No ? ST-segment elevation myocardial infarction with all the following: o Relevant symptoms suggestive of cardiac ischaemia within 12 hours before hospitalisation or during hospitalisation. o ECG-changes (in the absence of left ventricular hypertrophy or left bundle branch  block): ST-segment elevation at the J point in at least two contiguous leads  >=0.25 mV in men <40 years, >=0.2 mV in men >=40 years, or >=0.15 mV in women in  leads V2-V3; and/or >=0.1 mV in all other leads. or ? Non-ST-segment myocardial infarction with all the following: o Relevant symptoms suggestive of cardiac ischaemia within 24 hours before hospitalisation or during hospitalisation.  4. Rise and/or fall in cardiac  troponin I or T with at least one value above the 99th percentile upper reference limit. [x] Yes [] No 5. Possibility for randomisation as early as possible after invasive procedure, and latest within  36 hours of hospitalisation (time 0) for STEMI, and latest within 48 hours of hospitalisation  (time 0) for NSTEMI. [x] Yes [] No  6. Presence of at least one of the following criteria (confirmed based on the participant's medical  records and/or medical history interview): [x] Yes [] No []  Any prior MI. [x] Prior coronary revascularisation. [x] Diabetes mellitus treated with glucose-lowering agent(s). [x] Known CKD (eGFR >=15 and <60 mL/min/1.73 m2 [] Prior ischaemic stroke. []  Known carotid disease or peripheral artery disease in the lower extremities. [x] Multivessel coronary artery disease (current/prior). [] For STEMI patients only: anterior MI at index AMI.   7. Angiographic signs supporting the clinical suspicion of a type 1 MI including but not limited to: [x] Yes [] No ? A flow-limiting stenosis not present at the site of a prior stent. ? A relevant stenosis with features suggesting a thrombus/plaque rupture such as luminal  irregularities, thrombus-looking appearance, or haziness. Type 1 MI excludes type 2 MI (including but not limited to significant gastrointestinal bleedings,  anaemia and sepsis), and type 4 MI (a-c: periprocedural (PCI) MI, stent thrombosis and in-stent  restenosis) and type 5 MI (periprocedural (CABG) MI). Interpretation of the above is at the  discretion of the investigator. c The documented presence of a coronary stent on the coronary angiogram performed for the current  (index) AMI is acceptable to define prior coronary revascularisation. d Defined as >=50% stenosis on 2 or more epicardial artery territories or the left main artery during a  prior cardiac catheterisation or cardiac catheterisation during the current AMI,  or prior percutaneous  coronary intervention  (PCI) and >=50% stenosis of at least 1 epicardial artery territory different from  the prior revascularised artery, or prior multivessel coronary bypass grafting. e The anterior ST-elevation myocardial infarction is documented during the index AMI by relevant  electrocardiographic changes such as, but not limited to, ST-elevations in V1-V4.   Were all exclusion criteria met? [x] Yes [] No  General 1. Known or suspected hypersensitivity to study intervention or related products. [] Yes [x] No 2. Previous participation in this study. Participation is defined as randomisation. [] Yes [x] No 3. Male who is pregnant, breast-feeding or intends to become pregnant or is of childbearing  potential and not using an adequate and highly effective contraceptive method (adequate  contraceptive measures as required by local regulation or practice), as defined in Appendix 4,  Section 10.4.2. [] Yes [x] No 4. Participation (i.e., signed informed consent) in any other interventional clinical study of an  approved or non-approved investigational medicinal product within the past 30 days. 5. Any disorder, which in the investigator's opinion might jeopardise participant's safety or  compliance with the protocol. [] Yes [x] No 6. Absolute neutrophil count <2109/L. [] Yes [x] No 7. Platelet count <120109/L. [] Yes [x] No 8. ALT >8 x ULN. [] Yes [x] No a Medical conditions - cardiac and risk factors 9. Use of fibrinolytic therapy for treatment of the current AMI. [] Yes [x] No 10. Chronic heart failure classified as being in [place]  Heart Association (NYHA) Class IV. [] Yes [x] No 11. Ongoing haemodynamic instability defined as any of the following:  ? Killip Class III or IV. [] Yes [x] No ? Sustained and/or symptomatic hypotension (systolic blood pressure <90 mmHg). [] Yes [x] No 12. Severe kidney impairment defined as any of the following:  [] Yes [x] No ? Previous or current eGFR<15 mL/min/1.73 m2  . ? Chronic haemodialysis  or peritoneal dialysis.  13. Severe hepatic disease defined as at least one of the following: [] Yes [x] No ? Previously known or current hepatic encephalopathy (clinical evaluation). ? Previously known or current ascites (clinical evaluation). ? Jaundice (clinical evaluation). ? Previous oesophageal/gastric variceal bleeding. ? Known hepatic cirrhosis. 14. Major cardiac surgical (including but not restricted to coronary artery bypass graft surgery  [CABG]), non-cardiac surgical, or major endoscopic procedure (thoracoscopic or laparoscopic) within the past 60 days or any major surgical procedure planned at the time of randomisation or as treatment for the current AMI (CABG). Deferred (staged) percutaneous  coronary intervention for a non-culprit vessel identified during the current AMI is allowed. [] Yes [x] No Medical conditions - infections 15. History of recurrent serious infections (infections leading to hospitalisation or use of i.v.  antibiotics) within the past 12 months, at the discretion of the investigator. [] Yes [x] No 16. Clinical evidence of, or suspicion of, active infection at the discretion of the investigator. [] Yes [x] No 17. Known (acute or chronic) hepatitis B or hepatitis C. [] Yes [x] No 18. History or evidence of untreated latent tuberculosis (TB) such as (but not limited to): [] Yes [x] No ? History or evidence of a positive TB test or chest X-ray compatible with latent TB; and TB  treatment initiated less than 28 days prior to randomisation. ? Participants with TB risk factors (see details in Section 8.2.6) unwilling to undergo TB  treatment if confirmed positive for latent TB based on central laboratory test at baseline  (visit 2). 19. Diagnosis of human immunodeficiency virus (HIV) and not receiving a stable antiretroviral  regimen, at the discretion of the investigator. [] Yes [x] No 20. History of gastrointestinal perforation (Note: History of perforated appendicitis more  than  62 years old is  not exclusionary). [] Yes [x] No 21. History of active diverticulitis within the past 5 years. [] Yes [x] No 22. History of inflammatory bowel disease that has been clinically active within the past  12 months. [] Yes [x] No Medical conditions - other 23. Presence or history of malignant neoplasms or in situ carcinomas (other than basal or  squamous cell skin cancer, low risk prostate cancer, or in situ carcinomas of the cervix, or  carcinoma in situ/high grade prostatic intraepithelial neoplasia (PIN)) within the past 5 years. [] Yes [x] No 24. History of bone marrow or solid organ transplant or anticipated to receive an organ transplant  during the study. Note: Patients no longer receiving immune suppressant therapy and who  are in full remission following bone marrow transplant can be included in the study. [] Yes [x] No 25. Received a live or attenuated-live vaccine product within 4 weeks of study intervention administration or expected to receive a live or attenuated-live vaccine product during the  treatment period. (Note: Not-live and not attenuated-live vaccines are not exclusionary. For  guidance refer to Appendix 8, Section 10.8). [] Yes [x] No 26. Use of preventive systemic antibiotics, systemic antivirals, or systemic antifungals. (Note:  "Systemic" is defined as oral or i.v. administered drugs that are absorbed into the  circulation. Antibiotics used to treat latent TB are exempted). [] Yes [x] No 27. Use of systemic immunosuppressive drugs (both small molecules and biologics) or disease  modifying anti-rheumatic drugs (DMARDs including both biologic DMARDs like anti-TNFalpha and conventional DMARDs like methotrexate) or anticipated chronic use of such drugs  any time during the study. (Note: Use of otic, ophthalmic, inhaled, and topical corticosteroids  or local corticosteroid injections are not exclusionary. Furthermore, temporary systemic  immunosuppressive treatment of  e.g., chronic obstructive pulmonary disease [COPD]  exacerbations is allowed). [] Yes [x] No 28. Use of anti-IL-6 products or anticipated use of such drugs any time during the study. a Participants with increased levels of ALT <=8 x ULN are eligible at the discretion of the  investigator if the investigator considers the ALT elevations to be caused by the current AMI. In  case the elevation is considered related to other reasons than the current AMI, participants should be  excluded when ALT >2.5 x ULN. [] Yes [x] No b Endoscopic procedure for screening purposes such as gastroscopy and colonoscopy are allowed if  there is no suspicion of malignant disease driving the referral.    Randomization What was the date of randomization?: 02/Jan/2025 What was the time of randomization?: 0949   VS: Subject resting for >5 mins before VS taken.  See VS flowsheet.    Physical Exam Was the physical examination performed? [x] Yes [] No Was the examination date the same as the visit date? [x] Yes [] No   Hep B/C Central labs drawn: [x] Yes [] No    Central Lab assessments drawn: [x] Yes [] No   Hs-CRP, IL-6, Lipids, biochemistry/hematology, hbA1C, PK sampling, immunogenicity assessments   Was TB screening performed for subject? [x] Yes [] No [] N/A  Did subject have TB risk factors: [] Yes [x] No [] N/A  TB Central lab test in subjects with risk factors: [] Yes [] No [x] N/A   Pregnancy Test Was the sample collected? [] Yes [] No [x] N/A Was the collection date the same as the visit date? [] Yes [] No Specimen Type: [] Serum [] Urine Collection Date: Collection Time: Pregnancy Test Result: [] Positive [] Negative [] Borderline [] Invalid   Study Drug Administration Was dose administered?  [x] Yes [] No  Both autoinjectors given in L anterior thigh at 1104 without complication. Both auto injectors disposed of in sharps container. Kit # [id number].   EQ-5D-5L Assessment Completed? [  x]Yes [] No Reason not done:  [] Subject Forgot [] Subject too ill [] Subject refused [] Technical failure [] Other If Other, Specify Collection Date: 02/Jan/2025   Subject ID card given: [x] Yes [] No   Current Outpatient Medications:    [START ON 09/08/2023] amLODipine  (NORVASC ) 10 MG tablet, Take 1 tablet (10 mg total) by mouth daily., Disp: 90 tablet, Rfl: 1   aspirin  81 MG tablet, Take 81 mg by mouth daily., Disp: , Rfl:    Coenzyme Q10 (COQ10 PO), Take 1 tablet by mouth daily., Disp: , Rfl:    empagliflozin  (JARDIANCE ) 10 MG TABS tablet, Take 1 tablet (10 mg total) by mouth daily before breakfast., Disp: 30 tablet, Rfl: 2   escitalopram  (LEXAPRO ) 10 MG tablet, Take 10 mg by mouth daily., Disp: , Rfl:    [START ON 09/08/2023] ezetimibe  (ZETIA ) 10 MG tablet, Take 1 tablet (10 mg total) by mouth daily., Disp: 90 tablet, Rfl: 1   famotidine  (PEPCID ) 20 MG tablet, Take 20 mg by mouth daily. , Disp: , Rfl:    fluticasone  (FLONASE ) 50 MCG/ACT nasal spray, Place 2 sprays into both nostrils daily as needed for allergies., Disp: , Rfl:    meloxicam  (MOBIC ) 15 MG tablet, Take 15 mg by mouth daily., Disp: , Rfl:    metFORMIN  (GLUCOPHAGE ) 500 MG tablet, Take 1,000 mg by mouth 2 (two) times daily with a meal. , Disp: , Rfl:    metoprolol  succinate (TOPROL -XL) 50 MG 24 hr tablet, TAKE 1 TABLET BY MOUTH EVERY DAY, Disp: 90 tablet, Rfl: 1   nitroGLYCERIN  (NITROSTAT ) 0.4 MG SL tablet, Place 1 tablet (0.4 mg total) under the tongue every 5 (five) minutes as needed., Disp: 25 tablet, Rfl: 2   rosuvastatin  (CRESTOR ) 20 MG tablet, Take 1 tablet (20 mg total) by mouth daily. (Patient taking differently: Take 10 mg by mouth daily.), Disp: 90 tablet, Rfl: 3   sitaGLIPtin (JANUVIA) 100 MG tablet, Take 50 mg by mouth daily., Disp: , Rfl:    Study - ARTEMIS - ziltivekimab 15 mg/0.5 mL or placebo SQ injection (PI-Christopher), Inject 15 mg into the skin every 28 (twenty-eight) days. For Investigational Use Only. May be administered in the thigh, abdomen,  or upper arm, at any time of day irrespective of meals. Injections are to be administered on a flat skin surface. Should be injected once monthly on the same day of the month throughout the treatment period (scheduled dosing day), Disp: , Rfl:    ticagrelor  (BRILINTA ) 90 MG TABS tablet, Take 1 tablet (90 mg total) by mouth 2 (two) times daily., Disp: 180 tablet, Rfl: 2 No current facility-administered medications for this visit.  Facility-Administered Medications Ordered in Other Visits:    acetaminophen  (TYLENOL ) tablet 650 mg, 650 mg, Oral, Q4H PRN, Patwardhan, Manish J, MD   amLODipine  (NORVASC ) tablet 10 mg, 10 mg, Oral, Daily, Henry Shaver B, NP, 10 mg at 09/07/23 0842   aspirin  EC tablet 81 mg, 81 mg, Oral, Daily, Patwardhan, Manish J, MD, 81 mg at 09/07/23 0843   Chlorhexidine  Gluconate Cloth 2 % PADS 6 each, 6 each, Topical, Daily, Patwardhan, Manish J, MD, 6 each at 09/06/23 1600   ezetimibe  (ZETIA ) tablet 10 mg, 10 mg, Oral, Daily, Henry Shaver B, NP, 10 mg at 09/07/23 0842   heparin  injection 5,000 Units, 5,000 Units, Subcutaneous, Q8H, Patwardhan, Manish J, MD, 5,000 Units at 09/07/23 0630   insulin  aspart (novoLOG ) injection 0-15 Units, 0-15 Units, Subcutaneous, TID WC, Patwardhan, Manish J, MD, 5 Units at 09/07/23 1207   insulin   aspart (novoLOG ) injection 0-5 Units, 0-5 Units, Subcutaneous, QHS, Patwardhan, Manish J, MD   insulin  aspart (novoLOG ) injection 4 Units, 4 Units, Subcutaneous, TID WC, Patwardhan, Manish J, MD, 4 Units at 09/07/23 1206   metoprolol  succinate (TOPROL -XL) 24 hr tablet 50 mg, 50 mg, Oral, Daily, Patwardhan, Manish J, MD, 50 mg at 09/07/23 9157   ondansetron  (ZOFRAN ) injection 4 mg, 4 mg, Intravenous, Q6H PRN, Patwardhan, Manish J, MD   Oral care mouth rinse, 15 mL, Mouth Rinse, PRN, Patwardhan, Manish J, MD   rosuvastatin  (CRESTOR ) tablet 10 mg, 10 mg, Oral, Daily, Henry Shaver B, NP, 10 mg at 09/07/23 0843   sodium chloride  flush (NS) 0.9 %  injection 3 mL, 3 mL, Intravenous, Q12H, Patwardhan, Manish J, MD, 3 mL at 09/07/23 1207   sodium chloride  flush (NS) 0.9 % injection 3 mL, 3 mL, Intravenous, PRN, Patwardhan, Manish J, MD   ticagrelor  (BRILINTA ) tablet 90 mg, 90 mg, Oral, BID, Patwardhan, Manish J, MD, 90 mg at 09/07/23 9156     Are there any labs that are clinically significant?  Yes []  OR No[x] --see attestation  Is the patient eligible to continue enrollment in the study after screening visit?  Yes [x]  OR No[] 

## 2023-09-07 NOTE — Telephone Encounter (Addendum)
 MAILED APP TO PATIENT, PATIENT IS ASSIGNED FOR A MEDICAID SCREENING

## 2023-09-07 NOTE — Telephone Encounter (Signed)
   Transition of Care Follow-up Phone Call Request    Patient Name: Devon Wood Date of Birth: Feb 10, 1961 Date of Encounter: 09/07/2023  Primary Care Provider:  Ransom Other, MD Primary Cardiologist:  Jerel Balding, MD  Evalene CHRISTELLA Lemelin has been scheduled for a transition of care follow up appointment with a [facility] provider:  Damien Braver 1/9  Please reach out to Evalene CHRISTELLA Olheiser within 48 hours of discharge to confirm appointment and review transition of care protocol questionnaire. Anticipated discharge date: 1/2  Manuelita Rummer, NP  09/07/2023, 12:03 PM

## 2023-09-07 NOTE — Progress Notes (Signed)
  Echocardiogram 2D Echocardiogram has been performed.  Devon Wood Jamoni Hewes 09/07/2023, 10:44 AM

## 2023-09-07 NOTE — Inpatient Diabetes Management (Signed)
 Inpatient Diabetes Program Recommendations  AACE/ADA: New Consensus Statement on Inpatient Glycemic Control (2015)  Target Ranges:  Prepandial:   less than 140 mg/dL      Peak postprandial:   less than 180 mg/dL (1-2 hours)      Critically ill patients:  140 - 180 mg/dL    Latest Reference Range & Units 09/06/23 16:06  Hemoglobin A1C 4.8 - 5.6 % 9.5 (H)  226 mg/dl  (H): Data is abnormally high  Latest Reference Range & Units 09/06/23 16:49 09/06/23 21:08 09/07/23 07:30  Glucose-Capillary 70 - 99 mg/dL 829 (H)  3 units Novolog   148 (H) 154 (H)  8 units Novolog    (H): Data is abnormally high   Admit with: Code STEMI  History: DM  Home DM Meds: Metformin  1000 mg BID       Januvia 50 mg daily  Current Orders: Novolog  Moderate Correction Scale/ SSI (0-15 units) TID AC + HS     Novolog  4 units TID with meals    PCP: Dr. Ransom  Met w/ pt this afternoon to review A1c.  Pt told me he sees Dr. Husain for medical care.  Has CBG meter but only checking 3-4 times per week.  States he takes his Metformin  and Januvia everyday but states his Januvia dose was reduced to 25 mg daily 3 years ago due to issues with HYPO.  States his last A1c in April 2024 was around 6%.  Reminded patient that his goal A1c is 7% or less per ADA standards to prevent both acute and long-term complications.  Explained to patient the extreme importance of good glucose control at home.  Encouraged patient to check his CBGs at least bid at home (fasting and another check within the day) and to record all CBGs in a logbook for his PCP to review.  Encouraged pt to call his PCP office to review CBGs and possible need for medication adjustment if he continues to have elevated CBGs >200 at home.  Reviewed goal CBGs with pt as well.   --Will follow patient during hospitalization--  Adina Rudolpho Arrow RN, MSN, CDCES Diabetes Coordinator Inpatient Glycemic Control Team Team Pager: [phone] (8a-5p)

## 2023-09-07 NOTE — Discharge Instructions (Signed)
 Follow instructions/appointment schedule related to the ARTEMIS trial you agreed to participate in on 09/07/2023.

## 2023-09-07 NOTE — Plan of Care (Signed)
  Problem: Education: Goal: Knowledge of General Education information will improve Description: Including pain rating scale, medication(s)/side effects and non-pharmacologic comfort measures Outcome: Adequate for Discharge   Problem: Health Behavior/Discharge Planning: Goal: Ability to manage health-related needs will improve Outcome: Adequate for Discharge   Problem: Clinical Measurements: Goal: Ability to maintain clinical measurements within normal limits will improve Outcome: Adequate for Discharge Goal: Will remain free from infection Outcome: Adequate for Discharge Goal: Diagnostic test results will improve Outcome: Adequate for Discharge Goal: Respiratory complications will improve Outcome: Adequate for Discharge Goal: Cardiovascular complication will be avoided Outcome: Adequate for Discharge   Problem: Activity: Goal: Risk for activity intolerance will decrease Outcome: Adequate for Discharge   Problem: Nutrition: Goal: Adequate nutrition will be maintained Outcome: Adequate for Discharge   Problem: Coping: Goal: Level of anxiety will decrease Outcome: Adequate for Discharge   Problem: Elimination: Goal: Will not experience complications related to bowel motility Outcome: Adequate for Discharge Goal: Will not experience complications related to urinary retention Outcome: Adequate for Discharge   Problem: Pain Management: Goal: General experience of comfort will improve Outcome: Adequate for Discharge   Problem: Safety: Goal: Ability to remain free from injury will improve Outcome: Adequate for Discharge   Problem: Education: Goal: Ability to describe self-care measures that may prevent or decrease complications (Diabetes Survival Skills Education) will improve Outcome: Adequate for Discharge Goal: Individualized Educational Video(s) Outcome: Adequate for Discharge   Problem: Coping: Goal: Ability to adjust to condition or change in health will  improve Outcome: Adequate for Discharge   Problem: Fluid Volume: Goal: Ability to maintain a balanced intake and output will improve Outcome: Adequate for Discharge   Problem: Health Behavior/Discharge Planning: Goal: Ability to identify and utilize available resources and services will improve Outcome: Adequate for Discharge Goal: Ability to manage health-related needs will improve Outcome: Adequate for Discharge   Problem: Metabolic: Goal: Ability to maintain appropriate glucose levels will improve Outcome: Adequate for Discharge   Problem: Nutritional: Goal: Maintenance of adequate nutrition will improve Outcome: Adequate for Discharge Goal: Progress toward achieving an optimal weight will improve Outcome: Adequate for Discharge   Problem: Skin Integrity: Goal: Risk for impaired skin integrity will decrease Outcome: Adequate for Discharge   Problem: Tissue Perfusion: Goal: Adequacy of tissue perfusion will improve Outcome: Adequate for Discharge   Problem: Education: Goal: Understanding of CV disease, CV risk reduction, and recovery process will improve Outcome: Adequate for Discharge Goal: Individualized Educational Video(s) Outcome: Adequate for Discharge   Problem: Activity: Goal: Ability to return to baseline activity level will improve Outcome: Adequate for Discharge   Problem: Cardiovascular: Goal: Ability to achieve and maintain adequate cardiovascular perfusion will improve Outcome: Adequate for Discharge Goal: Vascular access site(s) Level 0-1 will be maintained Outcome: Adequate for Discharge   Problem: Health Behavior/Discharge Planning: Goal: Ability to safely manage health-related needs after discharge will improve Outcome: Adequate for Discharge   Problem: Skin Integrity: Goal: Risk for impaired skin integrity will decrease Outcome: Adequate for Discharge

## 2023-09-08 LAB — LIPOPROTEIN A (LPA): Lipoprotein (a): 8.7 nmol/L (ref <75.0)

## 2023-09-08 NOTE — Telephone Encounter (Signed)
 Attempted to call patient, no answer left message requesting a call back.

## 2023-09-11 NOTE — Telephone Encounter (Signed)
 Patient identification verified by 2 forms. Bertina Cooks, RN   Patient contacted regarding discharge from [facility] on 09/07/23.  Patient understands to follow up with provider NP Monge on 09/14/23 at 10:05am at [address]. Patient understands discharge instructions? Yes Patient understands medications and regiment? Yes  Patient understands to bring all medications to this visit? Yes

## 2023-09-12 ENCOUNTER — Telehealth: Payer: Self-pay | Admitting: Licensed Clinical Social Worker

## 2023-09-12 NOTE — Telephone Encounter (Signed)
 H&V Care Navigation CSW Progress Note  Clinical Social Worker contacted patient by phone to f/u on request from Prior Auth Rx team to check in. Pt was uninsured, started on two high cost medications. Insurance was supposed to start this month or next. I have left voicemail for pt at [phone] to discuss before upcoming appt any assistance pt may be eligible for at this time. Will re-attempt again as able.   Patient is participating in a Managed Medicaid Plan:  No, self pay only  SDOH Screenings   Food Insecurity: No Food Insecurity (09/06/2023)  Housing: Low Risk  (09/06/2023)  Transportation Needs: No Transportation Needs (09/06/2023)  Utilities: Not At Risk (09/06/2023)  Social Connections: Socially Integrated (09/06/2023)  Tobacco Use: Medium Risk (09/06/2023)    Marit Lark, MSW, LCSW Clinical Social Worker II [facility] Health Heart/Vascular Care Navigation  [phone]- work cell phone (preferred) [phone]- desk phone

## 2023-09-13 ENCOUNTER — Telehealth: Payer: Self-pay | Admitting: Licensed Clinical Social Worker

## 2023-09-13 ENCOUNTER — Telehealth (HOSPITAL_COMMUNITY): Payer: Self-pay

## 2023-09-13 NOTE — Progress Notes (Signed)
 Heart and Vascular Care Navigation  09/13/2023  Devon Wood 04/13/1961 [id number]  Reason for Referral: uninsured, medication assistance Patient is participating in a Managed Medicaid Plan: No, self pay only  Engaged with patient by telephone for initial visit for Heart and Vascular Care Coordination.                                                                                                   Assessment:    LCSW completed assessment over two phone calls today. Had received referral from [facility] team who had been notified that pt starting on Brilinta  and Jardiance  and was not ensured. LCSW noted pt has been screened for Medicaid, unfortunately at this time is over income for Medicaid expansion plan. Pt reached today at [phone]. Confirmed home address and PCP. Pt resides with wife, both he and she receive Social Security income at this time. 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 Pt denies any issues with affordability of transportation, housing, food or utilities. However he is rightfully concerned regarding the cost of his Brilinta  and Jardiance  since those are more expensive. He doesn't think he received any documents that were mailed to him by pharmacy team yet. LCSW discussed leaving these for pt tomorrow with notations of how to complete, pt encouraged to bring proof of income so these can be completed and then submitted by clinical staff.   Since pt was screened for Medicaid and was ineligible per [facility] team, and does not currently have insurance, we discussed applying for [employer] program. Pt will be provided with application and my card and I remain available for guidance.   No additional questions at this time. Will f/u with pt.                                    HRT/VAS Care Coordination     Patients Home Cardiology Office [facility]   Outpatient Care Team Social Worker   Social Worker Name: Marit Lark, [place], [phone]   Living arrangements for the past 2 months Single Family Home   Lives with: Spouse   Patient Current Insurance Coverage Self-Pay   Patient Has Concern With Paying Medical Bills Yes   Patient Concerns With Medical Bills uninsured, working on benefits from previous employment   Medical Bill Referrals: [facility] Financial Assistance   Does Patient Have Prescription Coverage? No   Patient Prescription Assistance Programs Patient Assistance Programs   Home Assistive Devices/Equipment CBG Meter       Social History:  SDOH Screenings   Food Insecurity: No Food Insecurity (09/13/2023)  Housing: Low Risk  (09/13/2023)  Transportation Needs: No Transportation Needs (09/13/2023)  Utilities: Not At Risk (09/13/2023)  Financial Resource Strain: Low Risk  (09/13/2023)  Social Connections: Socially Integrated (09/06/2023)  Tobacco Use: Medium Risk (09/06/2023)  Health Literacy: Adequate Health Literacy (09/13/2023)    SDOH Interventions: Financial Resources:  Financial Strain Interventions: Other (Comment) (concerns are around medication costs (Brilinta  and Jardiance )- will provide patient assistance forms; also worried about recent hospital bill. will provide CAFA to complete) Financial Counseling for [employer] Health Discount Program  Food Insecurity:  Food Insecurity Interventions: Intervention Not Indicated  Housing Insecurity:  Housing Interventions: Intervention Not Indicated  Transportation:   Transportation Interventions: Intervention Not Indicated    Other Care Navigation Interventions:     Provided Pharmacy assistance resources Patient Assistance Programs   Follow-up plan:   I have left [facility] Financial Assistance application as well as applications for Brilinta  and Jardiance  started with notations on how to complete for pt. Will  f/u to ensure provider and CMA receive these to review with pt and I will f/u to ensure pt aware of how to complete the paperwork for [facility] Financial Assistance program.

## 2023-09-13 NOTE — Telephone Encounter (Signed)
 Additional phone note opened in error.  Octavio Graves, MSW, LCSW Clinical Social Worker II [facility] Navigation  [phone]- work cell phone (preferred) [phone]- desk phone

## 2023-09-13 NOTE — Telephone Encounter (Signed)
 Called and spoke with pt in regards to CR, pt stated he is not interested at this time due to insurance. States he will contact CR if/when he gets insurance.   Closed referral

## 2023-09-14 ENCOUNTER — Encounter: Payer: Self-pay | Admitting: Nurse Practitioner

## 2023-09-14 ENCOUNTER — Ambulatory Visit: Payer: Self-pay | Attending: Nurse Practitioner | Admitting: Emergency Medicine

## 2023-09-14 ENCOUNTER — Other Ambulatory Visit: Payer: Self-pay

## 2023-09-14 VITALS — BP 110/70 | HR 75 | Ht 67.0 in | Wt 237.0 lb

## 2023-09-14 DIAGNOSIS — N1832 Chronic kidney disease, stage 3b: Secondary | ICD-10-CM

## 2023-09-14 DIAGNOSIS — I2129 ST elevation (STEMI) myocardial infarction involving other sites: Secondary | ICD-10-CM

## 2023-09-14 DIAGNOSIS — I255 Ischemic cardiomyopathy: Secondary | ICD-10-CM

## 2023-09-14 DIAGNOSIS — I10 Essential (primary) hypertension: Secondary | ICD-10-CM

## 2023-09-14 DIAGNOSIS — I77810 Thoracic aortic ectasia: Secondary | ICD-10-CM

## 2023-09-14 DIAGNOSIS — E785 Hyperlipidemia, unspecified: Secondary | ICD-10-CM

## 2023-09-14 DIAGNOSIS — I2510 Atherosclerotic heart disease of native coronary artery without angina pectoris: Secondary | ICD-10-CM

## 2023-09-14 DIAGNOSIS — I429 Cardiomyopathy, unspecified: Secondary | ICD-10-CM

## 2023-09-14 DIAGNOSIS — E119 Type 2 diabetes mellitus without complications: Secondary | ICD-10-CM

## 2023-09-14 NOTE — Patient Instructions (Signed)
 Medication Instructions:  Your physician recommends that you continue on your current medications as directed. Please refer to the Current Medication list given to you today.  *If you need a refill on your cardiac medications before your next appointment, please call your pharmacy*   Lab Work: BMET today Fasting Lipid panel in 3 months prior to your next apt  Testing/Procedures: Your physician has requested that you have an echocardiogram. Echocardiography is a painless test that uses sound waves to create images of your heart. It provides your doctor with information about the size and shape of your heart and how well your heart's chambers and valves are working. This procedure takes approximately one hour. There are no restrictions for this procedure. Please do NOT wear cologne, perfume, aftershave, or lotions (deodorant is allowed). Please arrive 15 minutes prior to your appointment time.  Please note: We ask at that you not bring children with you during ultrasound (echo/ vascular) testing. Due to room size and safety concerns, children are not allowed in the ultrasound rooms during exams. Our front office staff cannot provide observation of children in our lobby area while testing is being conducted. An adult accompanying a patient to their appointment will only be allowed in the ultrasound room at the discretion of the ultrasound technician under special circumstances. We apologize for any inconvenience.   Follow-Up: At [facility], you and your health needs are our priority.  As part of our continuing mission to provide you with exceptional heart care, we have created designated Provider Care Teams.  These Care Teams include your primary Cardiologist (physician) and Advanced Practice Providers (APPs -  Physician Assistants and Nurse Practitioners) who all work together to provide you with the care you need, when you need it.  We recommend signing up for the patient portal called  MyChart.  Sign up information is provided on this After Visit Summary.  MyChart is used to connect with patients for Virtual Visits (Telemedicine).  Patients are able to view lab/test results, encounter notes, upcoming appointments, etc.  Non-urgent messages can be sent to your provider as well.   To learn more about what you can do with MyChart, go to [email or web address].    Your next appointment:    3 months (must complete echo prior to this apt)  Provider:   Lum Louis NP    Other Instructions

## 2023-09-14 NOTE — Telephone Encounter (Signed)
 Pt was seen in office today by Lum Louis NP.  Brilinta  90 mg bid pt assistance forms were filled out and faxed to [employer] pt assistance company [phone], fax [phone]). Jardiance  10 mg daily pt assistance forms were filled out and faxed to [place] (#[phone], fax [phone]).  [place] forms were sent home with pt to fill out and send back per the application.

## 2023-09-14 NOTE — Progress Notes (Signed)
 Cardiology Office Note:    Date:  09/14/2023  ID:  Evalene CHRISTELLA Free, DOB [birth year], MRN [id number] PCP: Ransom Other, MD  [facility] Providers Cardiologist:  Jerel Balding, MD       Patient Profile:      Devon Wood is a 63 year old male with visit pertinent history of hypertension, hyperlipidemia, NIDDM, former smoker, morbid obesity, thoracic aortic aneurysm, CAD s/p PCI to LAD in 2004 and s/p PCI to ramus intermedius in 2025.  Patient had a remote stent placement to LAD in 2004 by Dr. Herminio.  Repeat cardiac catheterization 2011 showed widely patent stent.  Previously remote echocardiogram showed dilated ascending aorta.  Patient was admitted to the hospital on 09/06/2023 for STEMI.  Patient noted he had woke up around 7 AM that day with ingestion like symptoms that persisted now associated with chest pressure and left arm numbness.  EKG showed high lateral ST elevation and inferior ST depression.  He was taken to his the Cath Lab for urgent coronary angiography and intervention.  He underwent cardiac catheterization with proximal 99% thrombotic stenosis to culprit lesion in ramus intermedius treated with PCI/DES x 1.  He does have residual disease in ostial/proximal right PDA of 90%, small OM 80%, OM2 70%, as well as mid to distal LAD of 60 to 80%.  It was felt that these lesions were not ideal for PCI and recommendations were for medical management.  He was placed on DAPT with aspirin /Brilinta  for at least 1 year.  His lisinopril /hydrochlorothiazide  was stopped given his electrolyte abnormalities and CKD with creatinine of 2.4.  He was started on amlodipine  10 mg daily.  He is intolerant to higher doses of statins due to myalgias and was continued on Crestor  10 mg and Zetia  10 mg was added.  Echocardiogram on 09/07/2023 showed LVEF 50%, LV with mildly decreased systolic function, mid/apical anterior hypokinesis and hypokinesis of the anterolateral wall, grade 1 DD, RV SF normal,  moderate dilation of aortic root measuring 44 mm.      History of Present Illness:  Discussed the use of AI scribe software for clinical note transcription with the patient, who gave verbal consent to proceed.  Devon Wood is a 63 y.o. male who returns for hospital follow up for CAD.   Today he arrives to office alone. He denies any cardiovascular concerns or complaints since his discharge home.  He notes he feels great. He reports experiencing chest pain and left arm soreness starting in mid-November, which worsened and led to his recent hospitalization.  He has been without any chest pain or dyspnea since his cardiac catheterization.  He also participates in the Artemis study, receiving monthly injections.   He continues to stay very active and does mainly yardwork [place]. He denies any exertional angina during these times. He has been working on his diet and notes he has taken healthy eating courses in the past with relation to his diabetes. He is unsure if he wants to participate in cardiac rehab.   He denies chest pain, shortness of breath, lower extremity edema, fatigue, palpitations, melena, hematuria, hemoptysis, diaphoresis, weakness, presyncope, syncope, orthopnea, and PND.     Review of Systems  Constitutional: Negative for weight gain and weight loss.  Cardiovascular:  Negative for chest pain, claudication, cyanosis, dyspnea on exertion, irregular heartbeat, leg swelling, near-syncope, orthopnea, palpitations, paroxysmal nocturnal dyspnea and syncope.  Respiratory:  Negative for cough, hemoptysis and shortness of breath.   Gastrointestinal:  Negative for abdominal pain, hematochezia and melena.  Genitourinary:  Negative for hematuria.  Neurological:  Negative for dizziness, headaches and light-headedness.     See HPI    Studies Reviewed:   EKG Interpretation Date/Time:  Thursday September 14 2023 10:01:52 EST Ventricular Rate:  75 PR Interval:  200 QRS  Duration:  116 QT Interval:  410 QTC Calculation: 457 R Axis:   -71  Text Interpretation: Normal sinus rhythm Left axis deviation Pulmonary disease pattern Incomplete right bundle branch block Nonspecific ST and T wave abnormality Confirmed by Rana Dixon [phone]) on 09/14/2023 12:28:32 PM    Echocardiogram 09/07/2023 1. Left ventricular ejection fraction, by estimation, is 50%. The left  ventricle has mildly decreased systolic function. The left ventricle  demonstrates regional wall motion abnormalities with mid-apical anterior  hypokinesis and hypokinesis of the  anterolateral wall. Left ventricular diastolic parameters are consistent  with Grade I diastolic dysfunction (impaired relaxation).   2. Right ventricular systolic function is normal. The right ventricular  size is normal. Tricuspid regurgitation signal is inadequate for assessing  PA pressure.   3. The aortic valve is tricuspid. Aortic valve regurgitation is not  visualized. No aortic stenosis is present.   4. The mitral valve is normal in structure. No evidence of mitral valve  regurgitation. No evidence of mitral stenosis.   5. The inferior vena cava is normal in size with greater than 50%  respiratory variability, suggesting right atrial pressure of 3 mmHg.   6. Aortic dilatation noted. There is moderate dilatation of the aortic  root, measuring 44 mm.   Cardiac Catheterization 09/06/2023 LM: Normal LAD: Proximal LAD stent with 40% late lumen loss.         Diffuse mid LAD 60% disease, followed by focal 80% stenosis in mid-distal LAD. Ramus intermedius: Proximal 99% thrombotic stenosis (culprit lesion)                                  Lateral ramus branch with proximal 50% stenosis LCx: Small OM1 with ostial 80% stenosis         Moderate sized OM2 with diffuse 70% disease RCA: Ostial/proximal RPDA diffuse 90% stenosis          RPL 1 proximal 80% stenosis            Successful percutaneous coronary intervention prox  ramus        PTCA and stent placement 2.5 X 16 mm Synergy drug-eluting stent         Post dilatation using 2.75X12 mm [place]orne balloon up to 16 atm        TIMI flow I-->III        Culprit stenosis 99%-->0%  Risk Assessment/Calculations:             Physical Exam:   VS:  BP 110/70   Pulse 75   Ht 5' 7 (1.702 m)   Wt 237 lb (107.5 kg)   SpO2 99%   BMI 37.12 kg/m    Wt Readings from Last 3 Encounters:  09/14/23 237 lb (107.5 kg)  09/07/23 237 lb 8 oz (107.7 kg)  08/11/21 220 lb (99.8 kg)    Constitutional:      Appearance: Normal and healthy appearance.  Neck:     Vascular: JVD normal.  Pulmonary:     Effort: Pulmonary effort is normal.     Breath sounds: Normal breath sounds.  Chest:  Chest wall: Not tender to palpatation.  Cardiovascular:     PMI at left midclavicular line. Normal rate. Regular rhythm. Normal S1. Normal S2.      Murmurs: There is no murmur.     No gallop.  No click. No rub.  Pulses:    Intact distal pulses.  Edema:    Peripheral edema absent.  Musculoskeletal: Normal range of motion.     Cervical back: Normal range of motion and neck supple. Skin:    General: Skin is warm and dry.  Neurological:     General: No focal deficit present.     Mental Status: Alert and oriented to person, place and time.  Psychiatric:        Behavior: Behavior is cooperative.        Assessment and Plan:  Coronary artery disease / STEMI -STEMI 09/2023 and underwent cardiac catheterization with proximal 99% thrombotic stenosis to ramus intermedius treated with PCI/DES (2.5 x89mm) x1. He did have significant residual disease with ostial/proximal RPDA of 90%, RPL 1 80%, small OM1 80%, OM2 70% as well as mid-distal LAD of 60 to 80%, normal LM. It was felt remaining lesion were not ideal for PCI and recommendation will be for medical management.  -Echo 09/2023 with LVEF 50%, mid-apical anterior hypokinesis and hypokinesis of the anterolateral wall, grade 1DD -Today he is stable  with no anginal symptoms, no indication for ischemic evaluation. On DAPT x1 year. -Will repeat echo x3 months given slight decrease in LV function. Will order today for him to have completed before follow-up.  -Continue Asprin 81mg  daily, Plavix  75mg , Zetia  10mg  daily, Metoprolol -XL 50mg  daily, Rosuvastatin  20mg  daily  -He is in Artemis trial   -Note, pt is self pay and is working with clinical social work for assistance with medications   Aortic root dilation  -Echo 09/2023 noted to be 44mm. CT chest aorta ordered today for routine monitoring.   Hyperlipidemia  -LDL 75. On Cretsor 10mg , has been intolerant to higher does secondary to myalgias. Zetia  10mg  was added on at d/c. He will need a repeat fasting lipid panel x3 months. Order placed today.  -Continue Cretsor 10mg  and Zetia  10mg . -Encouraged heart healthy dieting and weight loss   Hypertension -Was controlled during his admission however lisinopril /hydrochlorothiazide  were discontinued due to creatinine of 2.4. Amlodipine  10mg  was added in its place at d/c.   -BP today 110/70 and under excellent control. Continue Amlodipine  10mg  and Metoprolol -XL 50mg    CKD stage 3b -Creatinine noited ot be 2.24 on admission and 2.40 on discharge. ACE/hydrochlorothiazide  stopped at d/c.  -Repeat BMP. Consider nephrology referral is GFR <30  T2DM -A1c 9.5 -Jardiance  10mg  added at d/c             Dispo:  Return in about 3 months (around 12/13/2023).  Signed, Lum LITTIE Louis, NP

## 2023-09-15 ENCOUNTER — Telehealth: Payer: Self-pay

## 2023-09-15 LAB — BASIC METABOLIC PANEL
BUN/Creatinine Ratio: 14 (ref 10–24)
BUN: 33 mg/dL — ABNORMAL HIGH (ref 8–27)
CO2: 18 mmol/L — ABNORMAL LOW (ref 20–29)
Calcium: 9.2 mg/dL (ref 8.6–10.2)
Chloride: 102 mmol/L (ref 96–106)
Creatinine, Ser: 2.35 mg/dL — ABNORMAL HIGH (ref 0.76–1.27)
Glucose: 191 mg/dL — ABNORMAL HIGH (ref 70–99)
Potassium: 4.9 mmol/L (ref 3.5–5.2)
Sodium: 137 mmol/L (ref 134–144)
eGFR: 31 mL/min/{1.73_m2} — ABNORMAL LOW (ref >59)

## 2023-09-15 NOTE — Telephone Encounter (Signed)
 Lmom to discuss lab results. Waiting on a return call.

## 2023-09-19 ENCOUNTER — Telehealth: Payer: Self-pay | Admitting: Licensed Clinical Social Worker

## 2023-09-19 NOTE — Telephone Encounter (Signed)
 H&V Care Navigation CSW Progress Note  Clinical Social Worker contacted patient by phone to f/u on assistance applications. Pt wife answered at [phone], states pt is not home took the dogs out for a drive. Requested I call back in 30 minutes. I shared I would try but may be tomorrow. If I don't reach pt I will leave voicemail with my name and number to get back into contact. Before calling pt back I contacted [employer] patient assistance for Brilinta  and [employer] for Jardiance . LCSW was able to receive updates that Jardiance  has been approved throuh 08/17/2025. Pt Brilinta  has been denied as they shared pt marked household of 3 and their income would put them in criteria for Medicaid. My understanding was pt is a household of 2, will attempt to clarify.   LCSW re-attempted pt, no answer, left voicemail requesting return call at [phone]. Will re-attempt again tomorrow.   Patient is participating in a Managed Medicaid Plan:  No, self pay only  SDOH Screenings   Food Insecurity: No Food Insecurity (09/13/2023)  Housing: Low Risk  (09/13/2023)  Transportation Needs: No Transportation Needs (09/13/2023)  Utilities: Not At Risk (09/13/2023)  Financial Resource Strain: Low Risk  (09/13/2023)  Social Connections: Socially Integrated (09/06/2023)  Tobacco Use: Medium Risk (09/14/2023)  Health Literacy: Adequate Health Literacy (09/13/2023)    Marit Lark, MSW, LCSW Clinical Social Worker II [facility] Heart/Vascular Care Navigation  [phone]- work cell phone (preferred) [phone]- desk phone

## 2023-09-20 ENCOUNTER — Telehealth: Payer: Self-pay | Admitting: Licensed Clinical Social Worker

## 2023-09-20 NOTE — Telephone Encounter (Signed)
 Noted.

## 2023-09-20 NOTE — Telephone Encounter (Signed)
 H&V Care Navigation CSW Progress Note  Clinical Social Worker contacted patient by phone to f/u again on approval for Jardiance  and [place].   Was able to reach him today at [phone]. Pt aware Jardiance  to be sent in 3-5 business days. He is to f/u with us  if not received.   Pt informed about [place] for [place]. He clarified his granddaughter lives with them but isnt claimed on taxes and doesn't have custody of her yet. I shared [place] to be [place]. Pt will bring to office to be faxed. He is not Medicaid eligible due to assests. He shares that he is still working on [employer].   Patient is participating in a Managed Medicaid Plan:  No, self pay only  SDOH Screenings   Food Insecurity: No Food Insecurity (09/13/2023)  Housing: Low Risk  (09/13/2023)  Transportation Needs: No Transportation Needs (09/13/2023)  Utilities: Not At Risk (09/13/2023)  Financial Resource Strain: Low Risk  (09/13/2023)  Social Connections: Socially Integrated (09/06/2023)  Tobacco Use: Medium Risk (09/14/2023)  Health Literacy: Adequate Health Literacy (09/13/2023)    Nathen Balder, MSW, LCSW Clinical Social Worker II [facility] Health Heart/Vascular Care Navigation  [phone]- work cell phone (preferred) [phone]- desk phone

## 2023-09-22 NOTE — Telephone Encounter (Signed)
H&V Care Navigation CSW Progress Note  Clinical Social Worker  received 1040, pt brought to office,  faxed to [facility] and Me, [phone]. Received fax confirmation will f/u next week. Pt pending [facility] Financial Assistance application as well to submit- will try to encourage him to complete that.  Patient is participating in a Managed Medicaid Plan:  no, self pay only  SDOH Screenings   Food Insecurity: No Food Insecurity (09/13/2023)  Housing: Low Risk  (09/13/2023)  Transportation Needs: No Transportation Needs (09/13/2023)  Utilities: Not At Risk (09/13/2023)  Financial Resource Strain: Low Risk  (09/13/2023)  Social Connections: Socially Integrated (09/06/2023)  Tobacco Use: Medium Risk (09/14/2023)  Health Literacy: Adequate Health Literacy (09/13/2023)    Octavio Graves, MSW, LCSW Clinical Social Worker II [facility] Health Heart/Vascular Care Navigation  [phone]- work cell phone (preferred) [phone]- desk phone

## 2023-09-26 ENCOUNTER — Telehealth: Payer: Self-pay

## 2023-09-26 DIAGNOSIS — N289 Disorder of kidney and ureter, unspecified: Secondary | ICD-10-CM

## 2023-09-26 NOTE — Telephone Encounter (Signed)
Left a detailed message with lab results and recommendations. Referral placed for nephrology.

## 2023-09-27 ENCOUNTER — Telehealth: Payer: Self-pay | Admitting: Licensed Clinical Social Worker

## 2023-09-27 NOTE — Telephone Encounter (Signed)
H&V Care Navigation CSW Progress Note  Clinical Social Worker contacted patient by phone to f/u with him regarding additional information from [place] for [place] Brilinta application.   I spoke with assistance program who confirm they received 1040 but need Medicaid denial letter or a letter attesting that pt is over asset limits for Medicaid along with state guidelines.  LCSW was able to reach pt at [phone]. Explained the above information, he'd like to be contacted by DSS for a formal application and denial letter. I shared DSS out of office today but I will send information to team at DSS clinic at [facility] for assistance. He shares he is okay with current medication he has, understands to call for samples if needed during this process.   Pt also shares he received formal confirmation text from [facility] for Jardiance, but hasn't received it yet. I encouraged him to call Friday if not received yet at that time. There may have been delay due to holiday on Monday.   Pt still working on [employer], getting documents has been complicated by back pain and weather issues. He asks I hold on to 1040 until he has completed his paperwork to assist with submitting. I will do so.   Patient is participating in a Managed Medicaid Plan:  No, self pay only  SDOH Screenings   Food Insecurity: No Food Insecurity (09/13/2023)  Housing: Low Risk  (09/13/2023)  Transportation Needs: No Transportation Needs (09/13/2023)  Utilities: Not At Risk (09/13/2023)  Financial Resource Strain: Low Risk  (09/13/2023)  Social Connections: Socially Integrated (09/06/2023)  Tobacco Use: Medium Risk (09/14/2023)  Health Literacy: Adequate Health Literacy (09/13/2023)    Devon Wood, MSW, LCSW Clinical Social Worker II [facility] Health Heart/Vascular Care Navigation  [phone]- work cell phone (preferred) [phone]- desk phone

## 2023-09-28 ENCOUNTER — Telehealth: Payer: Self-pay | Admitting: Licensed Clinical Social Worker

## 2023-09-28 NOTE — Telephone Encounter (Signed)
H&V Care Navigation CSW Progress Note  Clinical Social Worker  received a call from pt  to share that he received his Jardiance from [facility]. We discussed how to contact them when refill needed. If any issues with refill/new prescription needed pt to call nurse assistance.   Inquired if pt has heard from DSS, pt states he thinks he has but then after more conversation it was Artist f/u on [employer].   Encouraged him to monitor his calls should DSS call. He is amenable.   However, DSS called him shortly after our call and they had to leave a voicemail. I sent pt a text asking him to review his voicemail and call them back. Pt needs a letter of determination from DSS regarding Medicaid eligibility for Brilinta patient assistance.  Patient is participating in a Managed Medicaid Plan:  No, self pay only  SDOH Screenings   Food Insecurity: No Food Insecurity (09/13/2023)  Housing: Low Risk  (09/13/2023)  Transportation Needs: No Transportation Needs (09/13/2023)  Utilities: Not At Risk (09/13/2023)  Financial Resource Strain: Low Risk  (09/13/2023)  Social Connections: Socially Integrated (09/06/2023)  Tobacco Use: Medium Risk (09/14/2023)  Health Literacy: Adequate Health Literacy (09/13/2023)    Octavio Graves, MSW, LCSW Clinical Social Worker II [facility] Health Heart/Vascular Care Navigation  [phone]- work cell phone (preferred) [phone]- desk phone

## 2023-10-03 ENCOUNTER — Other Ambulatory Visit (HOSPITAL_COMMUNITY): Payer: Self-pay

## 2023-10-06 ENCOUNTER — Telehealth (HOSPITAL_BASED_OUTPATIENT_CLINIC_OR_DEPARTMENT_OTHER): Payer: Self-pay | Admitting: Licensed Clinical Social Worker

## 2023-10-06 NOTE — Telephone Encounter (Signed)
H&V Care Navigation CSW Progress Note  Clinical Social Worker contacted patient by phone to f/u on Medicaid application and CAFA. Pt had been assisted by [facility] DSS to submit formal Medicaid application. I have left voicemail for pt at [phone] , will re-attempt again as able.    Patient is participating in a Managed Medicaid Plan:  No, self pay only  SDOH Screenings   Food Insecurity: No Food Insecurity (09/13/2023)  Housing: Low Risk  (09/13/2023)  Transportation Needs: No Transportation Needs (09/13/2023)  Utilities: Not At Risk (09/13/2023)  Financial Resource Strain: Low Risk  (09/13/2023)  Social Connections: Socially Integrated (09/06/2023)  Tobacco Use: Medium Risk (09/14/2023)  Health Literacy: Adequate Health Literacy (09/13/2023)    Octavio Graves, MSW, LCSW Clinical Social Worker II [facility] Health Heart/Vascular Care Navigation  [phone]- work cell phone (preferred) [phone]- desk phone

## 2023-10-09 VITALS — BP 121/65 | HR 75 | Ht 67.0 in

## 2023-10-09 DIAGNOSIS — Z006 Encounter for examination for normal comparison and control in clinical research program: Secondary | ICD-10-CM

## 2023-10-09 MED ORDER — STUDY - ARTEMIS - ZILTIVEKIMAB 15 MG/0.5 ML OR PLACEBO SQ INJECTION (PI-CHRISTOPHER)
15.0000 mg | INJECTION | Freq: Once | SUBCUTANEOUS | Status: AC
  Administered 2023-10-09: 15 mg via SUBCUTANEOUS
  Filled 2023-10-09: qty 0.5

## 2023-10-09 MED ORDER — STUDY - ARTEMIS - ZILTIVEKIMAB 15 MG/0.5 ML OR PLACEBO SQ INJECTION (PI-CHRISTOPHER): 15.0000 mg | INJECTION | SUBCUTANEOUS | 0 refills | Status: DC

## 2023-10-09 NOTE — Research (Addendum)
 ARTEMIS V3 (Month 1 +/-3 days)  Were all eligibility criteria met to continue in study? [x] Yes [] No   Hep B DNA monitoring: [] Yes [x] No    Concomitant meds: [x] Yes [] No    Height, VS: [x] Yes [] No VS: Subject resting for >5 mins before 3 sets of VS taken.    Any hospitalizations/Adverse Events identified: [] Yes [x] No   Central Lab assessments drawn: [x] Yes [] No   Hs-CRP, Lipids, biochemistry/hematology, PK sampling, immunogenicity assessments   Pregnancy Test Was the sample collected? [] Yes [] No [x] N/A Was the collection date the same as the visit date? [] Yes [] No Specimen Type: [] Serum [] Urine Collection Date: Collection Time: Pregnancy Test Result: [] Positive [] Negative [] Borderline [] Invalid   Administer training of study intervention and dosing instructions and supervised self administration of study intervention during the site visit:  [x] Yes [] No  Subject given two pens from kit # [id number]. Subject successfully self administered study drug with one pen in R anterior thigh at 1030 without complication. Pen was thrown in sharps container. Second pen taken home with subject for home dose.    Study Drug dispensed via [facility]: [x] Yes [] No   Ensure updated contact person list: [x] Yes [] No   Subject given DFU and dosing diary: [x] Yes [] No    Current Outpatient Medications:    amLODipine (NORVASC) 10 MG tablet, Take 1 tablet (10 mg total) by mouth daily., Disp: 90 tablet, Rfl: 1   aspirin 81 MG tablet, Take 81 mg by mouth daily., Disp: , Rfl:    Coenzyme Q10 (COQ10 PO), Take 1 tablet by mouth daily., Disp: , Rfl:    empagliflozin (JARDIANCE) 10 MG TABS tablet, Take 1 tablet (10 mg total) by mouth daily before breakfast., Disp: 30 tablet, Rfl: 2   escitalopram (LEXAPRO) 10 MG tablet, Take 10 mg by mouth daily., Disp: , Rfl:    ezetimibe (ZETIA) 10 MG tablet, Take 1 tablet (10 mg total) by mouth daily., Disp: 90 tablet, Rfl: 1   famotidine (PEPCID) 20 MG tablet, Take 20  mg by mouth daily. , Disp: , Rfl:    fluticasone (FLONASE) 50 MCG/ACT nasal spray, Place 2 sprays into both nostrils daily as needed for allergies., Disp: , Rfl:    meloxicam (MOBIC) 15 MG tablet, Take 15 mg by mouth daily., Disp: , Rfl:    metFORMIN (GLUCOPHAGE) 500 MG tablet, Take 1,000 mg by mouth 2 (two) times daily with a meal. , Disp: , Rfl:    metoprolol succinate (TOPROL-XL) 50 MG 24 hr tablet, TAKE 1 TABLET BY MOUTH EVERY DAY, Disp: 90 tablet, Rfl: 1   nitroGLYCERIN (NITROSTAT) 0.4 MG SL tablet, Place 1 tablet (0.4 mg total) under the tongue every 5 (five) minutes as needed., Disp: 25 tablet, Rfl: 2   rosuvastatin (CRESTOR) 20 MG tablet, Take 1 tablet (20 mg total) by mouth daily. (Patient taking differently: Take 10 mg by mouth daily.), Disp: 90 tablet, Rfl: 3   sitaGLIPtin (JANUVIA) 100 MG tablet, Take 50 mg by mouth daily., Disp: , Rfl:    Study - ARTEMIS - ziltivekimab 15 mg/0.5 mL or placebo SQ injection (PI-Christopher), Inject 15 mg into the skin every 28 (twenty-eight) days. For Investigational Use Only. May be administered in the thigh, abdomen, or upper arm, at any time of day irrespective of meals. Injections are to be administered on a flat skin surface. Should be injected once monthly on the same day of the month throughout the treatment period (scheduled dosing day), Disp: , Rfl:    Study - ARTEMIS - ziltivekimab 15  mg/0.5 mL or placebo SQ injection (PI-Christopher), Inject 0.5 mLs (15 mg total) into the skin every 28 (twenty-eight) days. For Investigational Use Only., Disp: 0.5 mL, Rfl: 0   ticagrelor (BRILINTA) 90 MG TABS tablet, Take 1 tablet (90 mg total) by mouth 2 (two) times daily., Disp: 180 tablet, Rfl: 2     Are there any labs that are clinically significant?  Yes []  OR No[x] 

## 2023-10-10 ENCOUNTER — Telehealth: Payer: Self-pay | Admitting: Licensed Clinical Social Worker

## 2023-10-10 MED ORDER — AMLODIPINE BESYLATE 10 MG PO TABS: 10.0000 mg | ORAL_TABLET | Freq: Every day | ORAL | 3 refills | Status: DC

## 2023-10-10 MED ORDER — TICAGRELOR 90 MG PO TABS: 90.0000 mg | ORAL_TABLET | Freq: Two times a day (BID) | ORAL | 3 refills | Status: DC

## 2023-10-10 MED ORDER — EMPAGLIFLOZIN 10 MG PO TABS: 10.0000 mg | ORAL_TABLET | Freq: Every day | ORAL | 3 refills | Status: DC

## 2023-10-10 MED ORDER — NITROGLYCERIN 0.4 MG SL SUBL: 0.4000 mg | SUBLINGUAL_TABLET | SUBLINGUAL | 11 refills | Status: DC | PRN

## 2023-10-10 MED ORDER — METOPROLOL SUCCINATE ER 50 MG PO TB24: 50.0000 mg | ORAL_TABLET | Freq: Every day | ORAL | 3 refills | Status: DC

## 2023-10-10 MED ORDER — ROSUVASTATIN CALCIUM 20 MG PO TABS: 20.0000 mg | ORAL_TABLET | Freq: Every day | ORAL | 3 refills | Status: AC

## 2023-10-10 MED ORDER — EZETIMIBE 10 MG PO TABS: 10.0000 mg | ORAL_TABLET | Freq: Every day | ORAL | 3 refills | Status: DC

## 2023-10-10 NOTE — Addendum Note (Signed)
Addended by: Margaret Pyle D on: 10/10/2023 03:42 PM   Modules accepted: Orders

## 2023-10-10 NOTE — Telephone Encounter (Signed)
 H&V Care Navigation CSW Progress Note  Clinical Social Worker contacted caregiver by phone to f/u on referral to Medicaid screening and patient assistance forms. Was able to reach Devon Wood today at [phone]. He shares he received letter stating he was only eligible for Medicaid Family Planning at this time but was approved for insurance through former employer. Now has Community Education Officer. Provided him billing dept number and encouraged him to call and have them add information to account and rebill recent stays since coverage officially began 09/06/23. Devon Wood also requests all medications be [facility] to [facility] at [address]   No additional questions at this time, encouraged him to f/u as needed.   Patient is participating in a Managed Medicaid Plan:  no, Engineer, building services only  SDOH Screenings   Food Insecurity: No Food Insecurity (09/13/2023)  Housing: Low Risk  (09/13/2023)  Transportation Needs: No Transportation Needs (09/13/2023)  Utilities: Not At Risk (09/13/2023)  Financial Resource Strain: Low Risk  (09/13/2023)  Social Connections: Socially Integrated (09/06/2023)  Tobacco Use: Medium Risk (09/14/2023)  Health Literacy: Adequate Health Literacy (09/13/2023)    Marit Lark, MSW, LCSW Clinical Social Worker II [facility] Health Heart/Vascular Care Navigation  [phone]- work cell phone (preferred) [phone]- desk phone

## 2023-10-10 NOTE — Telephone Encounter (Signed)
 Pt's medications were sent to pt's pharmacy as requested. Confirmation received.

## 2023-10-11 ENCOUNTER — Telehealth: Payer: Self-pay | Admitting: Licensed Clinical Social Worker

## 2023-10-11 NOTE — Telephone Encounter (Signed)
 H&V Care Navigation CSW Progress Note  Clinical Social Worker contacted patient by phone to f/u and let him know I see [place] is populating on his chart - was able to reach him at [phone]. I let him know that we are able to see the information on his chart now. Inquired if he would like his tax return he dropped to the office shredded or mailed back to him. He asks for me to shred it and I will do so today.   No additional questions, encouraged him to call me as needed.   Patient is participating in a Managed Medicaid Plan:  No, Engineer, building services  SDOH Screenings   Food Insecurity: No Food Insecurity (09/13/2023)  Housing: Low Risk  (09/13/2023)  Transportation Needs: No Transportation Needs (09/13/2023)  Utilities: Not At Risk (09/13/2023)  Financial Resource Strain: Low Risk  (09/13/2023)  Social Connections: Socially Integrated (09/06/2023)  Tobacco Use: Medium Risk (09/14/2023)  Health Literacy: Adequate Health Literacy (09/13/2023)   Marit Lark, MSW, LCSW Clinical Social Worker II [facility] Health Heart/Vascular Care Navigation  [phone]- work cell phone (preferred) [phone]- desk phone

## 2023-10-13 ENCOUNTER — Ambulatory Visit (HOSPITAL_COMMUNITY)
Admission: RE | Admit: 2023-10-13 | Payer: Medicaid Other | Source: Ambulatory Visit | Attending: Emergency Medicine | Admitting: Emergency Medicine

## 2023-11-02 ENCOUNTER — Other Ambulatory Visit: Payer: Self-pay | Admitting: Internal Medicine

## 2023-11-02 DIAGNOSIS — Z87442 Personal history of urinary calculi: Secondary | ICD-10-CM

## 2023-11-03 DIAGNOSIS — Z006 Encounter for examination for normal comparison and control in clinical research program: Secondary | ICD-10-CM

## 2023-11-03 NOTE — Research (Addendum)
  ARTEMIS Phone visit P4 (Month 2 +/-3 days)    Were all eligibility criteria met to continue in study? [x] Yes [] No   Concomitant meds: [x] Yes [] No    Any hospitalizations/Adverse Events identified: [] Yes [x] No   Pregnancy Test Was the sample collected? [] Yes [] No [x] N/A Was the collection date the same as the visit date? [] Yes [] No Specimen Type: [] Serum [] Urine Collection Date: Collection Time: Pregnancy Test Result: [] Positive [] Negative [] Borderline [] Invalid   Assess dosing and administration conditions: Yes [x]  No[]  Patient successfully self administered study drug on 11/03/23 in R lower abdomen at 10:10 am without complication. Subject verbalized understanding to bring back pen with box to next appt.   Ensure updated contact person list: Yes [x]  No[]    Current Outpatient Medications:    amLODipine (NORVASC) 10 MG tablet, Take 1 tablet (10 mg total) by mouth daily., Disp: 90 tablet, Rfl: 3   aspirin 81 MG tablet, Take 81 mg by mouth daily., Disp: , Rfl:    Coenzyme Q10 (COQ10 PO), Take 1 tablet by mouth daily., Disp: , Rfl:    empagliflozin (JARDIANCE) 10 MG TABS tablet, Take 1 tablet (10 mg total) by mouth daily before breakfast., Disp: 90 tablet, Rfl: 3   escitalopram (LEXAPRO) 10 MG tablet, Take 10 mg by mouth daily., Disp: , Rfl:    ezetimibe (ZETIA) 10 MG tablet, Take 1 tablet (10 mg total) by mouth daily., Disp: 90 tablet, Rfl: 3   famotidine (PEPCID) 20 MG tablet, Take 20 mg by mouth daily. , Disp: , Rfl:    fluticasone (FLONASE) 50 MCG/ACT nasal spray, Place 2 sprays into both nostrils daily as needed for allergies., Disp: , Rfl:    meloxicam (MOBIC) 15 MG tablet, Take 15 mg by mouth daily., Disp: , Rfl:    metFORMIN (GLUCOPHAGE) 500 MG tablet, Take 1,000 mg by mouth 2 (two) times daily with a meal. , Disp: , Rfl:    metoprolol succinate (TOPROL-XL) 50 MG 24 hr tablet, Take 1 tablet (50 mg total) by mouth daily., Disp: 90 tablet, Rfl: 3   nitroGLYCERIN (NITROSTAT)  0.4 MG SL tablet, Place 1 tablet (0.4 mg total) under the tongue every 5 (five) minutes as needed., Disp: 25 tablet, Rfl: 11   rosuvastatin (CRESTOR) 20 MG tablet, Take 1 tablet (20 mg total) by mouth daily., Disp: 90 tablet, Rfl: 3   sitaGLIPtin (JANUVIA) 100 MG tablet, Take 50 mg by mouth daily., Disp: , Rfl:    Study - ARTEMIS - ziltivekimab 15 mg/0.5 mL or placebo SQ injection (PI-Christopher), Inject 15 mg into the skin every 28 (twenty-eight) days. For Investigational Use Only. May be administered in the thigh, abdomen, or upper arm, at any time of day irrespective of meals. Injections are to be administered on a flat skin surface. Should be injected once monthly on the same day of the month throughout the treatment period (scheduled dosing day), Disp: , Rfl:    Study - ARTEMIS - ziltivekimab 15 mg/0.5 mL or placebo SQ injection (PI-Christopher), Inject 0.5 mLs (15 mg total) into the skin every 28 (twenty-eight) days. For Investigational Use Only., Disp: 0.5 mL, Rfl: 0   ticagrelor (BRILINTA) 90 MG TABS tablet, Take 1 tablet (90 mg total) by mouth 2 (two) times daily., Disp: 180 tablet, Rfl: 3

## 2023-11-27 ENCOUNTER — Ambulatory Visit
Admission: RE | Admit: 2023-11-27 | Discharge: 2023-11-27 | Disposition: A | Discharge status: Home / Self Care | Payer: 59 | Source: Ambulatory Visit | Attending: Internal Medicine | Admitting: Internal Medicine

## 2023-11-27 DIAGNOSIS — Z87442 Personal history of urinary calculi: Secondary | ICD-10-CM

## 2023-12-07 ENCOUNTER — Ambulatory Visit (HOSPITAL_COMMUNITY)
Admission: RE | Admit: 2023-12-07 | Discharge: 2023-12-07 | Disposition: A | Discharge status: Home / Self Care | Payer: 59 | Source: Ambulatory Visit | Attending: Emergency Medicine | Admitting: Emergency Medicine

## 2023-12-07 DIAGNOSIS — R600 Localized edema: Secondary | ICD-10-CM | POA: Diagnosis not present

## 2023-12-07 DIAGNOSIS — I77810 Thoracic aortic ectasia: Secondary | ICD-10-CM | POA: Insufficient documentation

## 2023-12-07 DIAGNOSIS — I255 Ischemic cardiomyopathy: Secondary | ICD-10-CM | POA: Insufficient documentation

## 2023-12-07 LAB — ECHOCARDIOGRAM COMPLETE
AR max vel: 2.94 cm2
AV Area VTI: 2.85 cm2
AV Area mean vel: 2.61 cm2
AV Mean grad: 4 mmHg
AV Peak grad: 9 mmHg
Ao pk vel: 1.5 m/s
Area-P 1/2: 3.12 cm2
S' Lateral: 2.81 cm

## 2023-12-07 MED ORDER — PERFLUTREN LIPID MICROSPHERE
1.0000 mL | INTRAVENOUS | Status: AC | PRN
  Administered 2023-12-07: 5 mL via INTRAVENOUS

## 2023-12-11 ENCOUNTER — Encounter

## 2023-12-11 DIAGNOSIS — Z006 Encounter for examination for normal comparison and control in clinical research program: Secondary | ICD-10-CM

## 2023-12-11 MED ORDER — STUDY - ARTEMIS - ZILTIVEKIMAB 15 MG/0.5 ML OR PLACEBO SQ INJECTION (PI-CHRISTOPHER): 15.0000 mg | INJECTION | SUBCUTANEOUS | 0 refills | Status: DC

## 2023-12-11 MED ORDER — STUDY - ARTEMIS - ZILTIVEKIMAB 15 MG/0.5 ML OR PLACEBO SQ INJECTION (PI-CHRISTOPHER)
15.0000 mg | INJECTION | Freq: Once | SUBCUTANEOUS | Status: AC
Start: 2023-12-11 — End: 2023-12-11
  Administered 2023-12-11: 15 mg via SUBCUTANEOUS
  Filled 2023-12-11: qty 0.5

## 2023-12-11 NOTE — Progress Notes (Unsigned)
 Cardiology Office Note:    Date:  12/11/2023  ID:  Devon Wood, DOB 03-Feb-1961, MRN [id number] PCP: Georgann Housekeeper, MD  [facility] Providers Cardiologist:  Thurmon Fair, MD { Click to update primary MD,subspecialty MD or APP then REFRESH:1}    {Click to Open Review  :1}   Patient Profile:      Chief Complaint: *** History of Present Illness:  Devon Wood is a 63 y.o. male with visit-pertinent history of hypertension, hyperlipidemia, NIDDM, former smoker, morbid obesity, thoracic aortic aneurysm, CAD s/p PCI to LAD in 2004 and s/p PCI to ramus intermedius in 2025.   Patient had a remote stent placement to LAD in 2004 by Dr. Jenne Campus.  Repeat cardiac catheterization 2011 showed widely patent stent.  Previously remote echocardiogram showed dilated ascending aorta.   Patient was admitted to the hospital on 09/06/2023 for STEMI.  Patient noted he had woke up around 7 AM that day with ingestion like symptoms that persisted with associated with chest pressure and left arm numbness.  EKG showed high lateral ST elevation and inferior ST depression.  He was taken to his the Cath Lab for urgent coronary angiography and intervention.   He underwent cardiac catheterization with proximal 99% thrombotic stenosis to culprit lesion in ramus intermedius treated with PCI/DES x 1.  He does have residual disease in ostial/proximal right PDA of 90%, small OM 80%, OM2 70%, as well as mid to distal LAD of 60 to 80%.  It was felt that these lesions were not ideal for PCI and recommendations were for medical management.  He was placed on DAPT with aspirin/Brilinta for at least 1 year.  His lisinopril/hydrochlorothiazide was stopped given his electrolyte abnormalities and CKD with creatinine of 2.4.  He was started on amlodipine 10 mg daily.  He is intolerant to higher doses of statins due to myalgias and was continued on Crestor 10 mg and Zetia 10 mg was added.   Echocardiogram on 09/07/2023 showed LVEF 50%, LV  with mildly decreased systolic function, mid/apical anterior hypokinesis and hypokinesis of the anterolateral wall, grade 1 DD, RV SF normal, moderate dilation of aortic root measuring 44 mm.  He was last seen in office on 09/14/2023.  He was without use without acute cardiovascular concerns or complaints and denied any chest pains.  Echocardiogram was ordered for 3 months out and completed on 12/07/2023 showing LVEF 60 to 65%, no RWMA, RV function and size normal, normal PASP, trivial MR, dilation of aortic root measuring 43 mm dilation of ascending aorta measuring 39 mm.  Discussed the use of AI scribe software for clinical note transcription with the patient, who gave verbal consent to proceed.  History of Present Illness     Review of systems:  Please see the history of present illness. All other systems are reviewed and otherwise negative. ***     Home Medications:    No outpatient medications have been marked as taking for the 12/15/23 encounter (Appointment) with [name], NP.   Studies Reviewed:       *** Risk Assessment/Calculations:   {Does this patient have ATRIAL FIBRILLATION?:[phone]}         Physical Exam:   VS:  There were no vitals taken for this visit.   Wt Readings from Last 3 Encounters:  09/14/23 237 lb (107.5 kg)  09/07/23 237 lb 8 oz (107.7 kg)  08/11/21 220 lb (99.8 kg)    GEN: Well nourished, well developed in no acute distress NECK:  No JVD; No carotid bruits CARDIAC: ***RRR, no murmurs, rubs, gallops RESPIRATORY:  Clear to auscultation without rales, wheezing or rhonchi  ABDOMEN: Soft, non-tender, non-distended EXTREMITIES:  No edema; No acute deformity ***     Assessment and Plan:  Assessment and Plan Assessment & Plan      {Are you ordering a CV Procedure (e.g. stress test, cath, DCCV, TEE, etc)?   Press F2        :[id number]}  Dispo:  No follow-ups on file.  Signed, [name], NP

## 2023-12-11 NOTE — Research (Addendum)
 ARTEMIS V5 (Month 3 +/-3 days)  Were all eligibility criteria met to continue in study? [x] Yes [] No Per subject, used chewing tobacco from 1979-87 Subject is out of window for visit due to [employer] not picking up labs on 12/08/23   Hep B DNA monitoring: [] Yes [x] No    Concomitant meds: [x] Yes [] No    Height, VS: [x] Yes [] No Subject resting for >5 mins before VS taken.    Any hospitalizations/Adverse Events identified: [] Yes [x] No   Central Lab assessments drawn: [x] Yes [] No   Hs-CRP, Lipids, biochemistry/hematology, PK sampling, immunogenicity assessments   Pregnancy Test Was the sample collected? [] Yes [] No [x] N/A Was the collection date the same as the visit date? [] Yes [] No Specimen Type: [] Serum [] Urine Collection Date: Collection Time: Pregnancy Test Result: [] Positive [] Negative [] Borderline [] Invalid   Administer training of study intervention and dosing instructions and supervised self administration of study intervention during the site visit:[x] Yes [] No  Subject returned pen from kit # [id number] with box. Pen placed in sharps container and box returned to pharmacy. Subject successfully self administered study drug in LL abdomen at 0949 without complication with pen from kit # [id number] and placed in sharps container. Subject took home 3 pens for home doses from kit # [id number] and [id number]. Instructed to return used pens with boxes to next appt. Pt verbalized understanding.    Study Drug dispensed via [facility]: [x] Yes [] No   Assess dosing and administration conditions: [x] Yes [] No   Ensure updated contact person list: [x] Yes [] No    Current Outpatient Medications:    amLODipine (NORVASC) 10 MG tablet, Take 1 tablet (10 mg total) by mouth daily., Disp: 90 tablet, Rfl: 3   aspirin 81 MG tablet, Take 81 mg by mouth daily., Disp: , Rfl:    Coenzyme Q10 (COQ10 PO), Take 1 tablet by mouth daily., Disp: , Rfl:    empagliflozin (JARDIANCE) 10 MG TABS tablet, Take 1 tablet (10 mg  total) by mouth daily before breakfast., Disp: 90 tablet, Rfl: 3   escitalopram (LEXAPRO) 10 MG tablet, Take 10 mg by mouth daily., Disp: , Rfl:    ezetimibe (ZETIA) 10 MG tablet, Take 1 tablet (10 mg total) by mouth daily., Disp: 90 tablet, Rfl: 3   famotidine (PEPCID) 20 MG tablet, Take 20 mg by mouth daily. , Disp: , Rfl:    fluticasone (FLONASE) 50 MCG/ACT nasal spray, Place 2 sprays into both nostrils daily as needed for allergies., Disp: , Rfl:    meloxicam (MOBIC) 15 MG tablet, Take 15 mg by mouth daily., Disp: , Rfl:    metFORMIN (GLUCOPHAGE) 500 MG tablet, Take 1,000 mg by mouth 2 (two) times daily with a meal. , Disp: , Rfl:    metoprolol succinate (TOPROL-XL) 50 MG 24 hr tablet, Take 1 tablet (50 mg total) by mouth daily., Disp: 90 tablet, Rfl: 3   nitroGLYCERIN (NITROSTAT) 0.4 MG SL tablet, Place 1 tablet (0.4 mg total) under the tongue every 5 (five) minutes as needed., Disp: 25 tablet, Rfl: 11   rosuvastatin (CRESTOR) 20 MG tablet, Take 1 tablet (20 mg total) by mouth daily., Disp: 90 tablet, Rfl: 3   sitaGLIPtin (JANUVIA) 100 MG tablet, Take 50 mg by mouth daily., Disp: , Rfl:    Study - ARTEMIS - ziltivekimab 15 mg/0.5 mL or placebo SQ injection (PI-Christopher), Inject 15 mg into the skin every 28 (twenty-eight) days. For Investigational Use Only. May be administered in the thigh, abdomen, or upper arm, at any time of day irrespective of meals. Injections are  to be administered on a flat skin surface. Should be injected once monthly on the same day of the month throughout the treatment period (scheduled dosing day), Disp: , Rfl:    Study - ARTEMIS - ziltivekimab 15 mg/0.5 mL or placebo SQ injection (PI-Christopher), Inject 0.5 mLs (15 mg total) into the skin every 28 (twenty-eight) days. For Investigational Use Only., Disp: 2 mL, Rfl: 0   ticagrelor (BRILINTA) 90 MG TABS tablet, Take 1 tablet (90 mg total) by mouth 2 (two) times daily., Disp: 180 tablet, Rfl: 3

## 2023-12-13 DIAGNOSIS — Z006 Encounter for examination for normal comparison and control in clinical research program: Secondary | ICD-10-CM

## 2023-12-13 NOTE — Research (Addendum)
 Called pt to schedule lab re draw for Artemis trial. Pt did not answer, LVM to call research office back.   Labs re drawn and returned WNL. See research note from 12/11/23 for results.

## 2023-12-13 NOTE — Research (Addendum)
 Are there any labs that are clinically significant?  Yes []  OR No[x]   Is the patient eligible to continue enrollment in the study after screening visit?  Yes [x]   OR No[]    CBC re drawn with local lab. See results below.      Patient has been educated on dosing on same day each month to avoid doses less than 28 days apart.  Patient will be contacted to re-draw CBC for platelet count.

## 2023-12-14 LAB — LIPID PANEL
Chol/HDL Ratio: 2.6 ratio (ref 0.0–5.0)
Cholesterol, Total: 73 mg/dL — ABNORMAL LOW (ref 100–199)
HDL: 28 mg/dL — ABNORMAL LOW (ref >39)
LDL Chol Calc (NIH): 23 mg/dL (ref 0–99)
Triglycerides: 123 mg/dL (ref 0–149)
VLDL Cholesterol Cal: 22 mg/dL (ref 5–40)

## 2023-12-15 ENCOUNTER — Encounter

## 2023-12-15 ENCOUNTER — Ambulatory Visit: Payer: Self-pay | Attending: Emergency Medicine | Admitting: Emergency Medicine

## 2023-12-15 ENCOUNTER — Encounter: Payer: Self-pay | Admitting: Emergency Medicine

## 2023-12-15 VITALS — BP 110/70 | HR 72 | Ht 67.0 in | Wt 240.0 lb

## 2023-12-15 DIAGNOSIS — E119 Type 2 diabetes mellitus without complications: Secondary | ICD-10-CM

## 2023-12-15 DIAGNOSIS — N1832 Chronic kidney disease, stage 3b: Secondary | ICD-10-CM

## 2023-12-15 DIAGNOSIS — I77810 Thoracic aortic ectasia: Secondary | ICD-10-CM

## 2023-12-15 DIAGNOSIS — I2129 ST elevation (STEMI) myocardial infarction involving other sites: Secondary | ICD-10-CM | POA: Diagnosis not present

## 2023-12-15 DIAGNOSIS — I2510 Atherosclerotic heart disease of native coronary artery without angina pectoris: Secondary | ICD-10-CM

## 2023-12-15 DIAGNOSIS — E785 Hyperlipidemia, unspecified: Secondary | ICD-10-CM

## 2023-12-15 DIAGNOSIS — I10 Essential (primary) hypertension: Secondary | ICD-10-CM | POA: Diagnosis not present

## 2023-12-15 NOTE — Patient Instructions (Signed)
 Medication Instructions:  NO CHANGES    Lab Work: BMET AND CBC TO BE DONE TODAY.  Testing/Procedures: NONE  Follow-Up: At [facility], you and your health needs are our priority.  As part of our continuing mission to provide you with exceptional heart care, our providers are all part of one team.  This team includes your primary Cardiologist (physician) and Advanced Practice Providers or APPs (Physician Assistants and Nurse Practitioners) who all work together to provide you with the care you need, when you need it.  Your next appointment:   6 month(s)  Provider:   Thurmon Fair, MD  OR Rise Paganini, DNP    Other Instructions:      1st Floor: - Lobby - Registration  - Pharmacy  - Lab - Cafe  2nd Floor: - PV Lab - Diagnostic Testing (echo, CT, nuclear med)  3rd Floor: - Vacant  4th Floor: - TCTS (cardiothoracic surgery) - AFib Clinic - Structural Heart Clinic - Vascular Surgery  - Vascular Ultrasound  5th Floor: - [facility] (general and EP) - Clinical Pharmacy for coumadin, hypertension, lipid, weight-loss medications, and med management appointments    Valet parking services will be available as well.

## 2023-12-16 LAB — CBC
Hematocrit: 40.4 % (ref 37.5–51.0)
Hemoglobin: 13 g/dL (ref 13.0–17.7)
MCH: 28.8 pg (ref 26.6–33.0)
MCHC: 32.2 g/dL (ref 31.5–35.7)
MCV: 90 fL (ref 79–97)
Platelets: 241 10*3/uL (ref 150–450)
RBC: 4.51 x10E6/uL (ref 4.14–5.80)
RDW: 12.6 % (ref 11.6–15.4)
WBC: 8 10*3/uL (ref 3.4–10.8)

## 2023-12-16 LAB — BASIC METABOLIC PANEL WITH GFR
BUN/Creatinine Ratio: 14 (ref 10–24)
BUN: 30 mg/dL — ABNORMAL HIGH (ref 8–27)
CO2: 16 mmol/L — ABNORMAL LOW (ref 20–29)
Calcium: 9.4 mg/dL (ref 8.6–10.2)
Chloride: 103 mmol/L (ref 96–106)
Creatinine, Ser: 2.19 mg/dL — ABNORMAL HIGH (ref 0.76–1.27)
Glucose: 230 mg/dL — ABNORMAL HIGH (ref 70–99)
Potassium: 4.8 mmol/L (ref 3.5–5.2)
Sodium: 139 mmol/L (ref 134–144)
eGFR: 33 mL/min/{1.73_m2} — ABNORMAL LOW (ref >59)

## 2023-12-18 ENCOUNTER — Other Ambulatory Visit: Payer: Self-pay | Admitting: *Deleted

## 2023-12-18 ENCOUNTER — Ambulatory Visit (HOSPITAL_COMMUNITY)
Admission: RE | Admit: 2023-12-18 | Discharge: 2023-12-18 | Disposition: A | Discharge status: Home / Self Care | Source: Ambulatory Visit | Attending: Emergency Medicine | Admitting: Emergency Medicine

## 2023-12-18 ENCOUNTER — Encounter (HOSPITAL_COMMUNITY): Payer: Self-pay

## 2023-12-18 DIAGNOSIS — I255 Ischemic cardiomyopathy: Secondary | ICD-10-CM

## 2023-12-18 DIAGNOSIS — I77810 Thoracic aortic ectasia: Secondary | ICD-10-CM

## 2023-12-21 ENCOUNTER — Other Ambulatory Visit: Payer: Self-pay | Admitting: *Deleted

## 2023-12-21 DIAGNOSIS — N289 Disorder of kidney and ureter, unspecified: Secondary | ICD-10-CM

## 2023-12-21 DIAGNOSIS — I10 Essential (primary) hypertension: Secondary | ICD-10-CM

## 2023-12-21 MED ORDER — LISINOPRIL-HYDROCHLOROTHIAZIDE 20-12.5 MG PO TABS: 1.0000 | ORAL_TABLET | Freq: Every day | ORAL | 2 refills | Status: DC

## 2023-12-30 LAB — BASIC METABOLIC PANEL WITH GFR
BUN/Creatinine Ratio: 13 (ref 10–24)
BUN: 28 mg/dL — ABNORMAL HIGH (ref 8–27)
CO2: 17 mmol/L — ABNORMAL LOW (ref 20–29)
Calcium: 9.1 mg/dL (ref 8.6–10.2)
Chloride: 102 mmol/L (ref 96–106)
Creatinine, Ser: 2.13 mg/dL — ABNORMAL HIGH (ref 0.76–1.27)
Glucose: 254 mg/dL — ABNORMAL HIGH (ref 70–99)
Potassium: 4.5 mmol/L (ref 3.5–5.2)
Sodium: 135 mmol/L (ref 134–144)
eGFR: 35 mL/min/{1.73_m2} — ABNORMAL LOW (ref >59)

## 2024-01-01 ENCOUNTER — Ambulatory Visit (HOSPITAL_COMMUNITY)
Admission: RE | Admit: 2024-01-01 | Discharge: 2024-01-01 | Disposition: A | Discharge status: Home / Self Care | Payer: Self-pay | Source: Ambulatory Visit | Attending: Emergency Medicine | Admitting: Emergency Medicine

## 2024-01-01 DIAGNOSIS — I255 Ischemic cardiomyopathy: Secondary | ICD-10-CM | POA: Insufficient documentation

## 2024-01-01 MED ORDER — IOHEXOL 350 MG/ML SOLN
75.0000 mL | Freq: Once | INTRAVENOUS | Status: AC | PRN
  Administered 2024-01-01: 75 mL via INTRAVENOUS

## 2024-03-06 ENCOUNTER — Encounter

## 2024-03-06 DIAGNOSIS — Z006 Encounter for examination for normal comparison and control in clinical research program: Secondary | ICD-10-CM

## 2024-03-06 MED ORDER — STUDY - ARTEMIS - ZILTIVEKIMAB 15 MG/0.5 ML OR PLACEBO SQ INJECTION (PI-CHRISTOPHER): 15.0000 mg | INJECTION | SUBCUTANEOUS | 0 refills | Status: DC

## 2024-03-06 NOTE — Research (Addendum)
 ARTEMIS V6 (Month 6 +/-7 days)  Were all eligibility criteria met to continue in study? [x] Yes [] No   Hep B DNA monitoring: [] Yes [x] No    Concomitant meds: [x] Yes [] No    VS: [x] Yes [] No Subject resting for >5 mins before VS taken.    Any hospitalizations/Adverse Events identified: [] Yes [x] No   Echocardiography data: [x] Yes [] No   EQ-5D-5L Assessment Completed? [x] Yes [] No Reason not done: [] Subject Forgot [] Subject too ill [] Subject refused [] Technical failure [] Other If Other, Specify Collection Date: 02/Jul/2025   Central Lab assessments drawn: [x] Yes [] No   Hs-CRP, biochemistry/hematology, PK sampling, immunogenicity assessments   Pregnancy Test Was the sample collected? [] Yes [] No [x] N/A Was the collection date the same as the visit date? [] Yes [] No Specimen Type: [] Serum [] Urine Collection Date: Collection Time: Pregnancy Test Result: [] Positive [] Negative [] Borderline [] Invalid   Study Drug dispensed via [facility]: [x] Yes [] No Subject returned pens from kit # [id number] and 1 unused pen from kit # [id number] with boxes. Pens placed in sharps container and boxes and unused pen returned to pharmacy. Subject took home 4 pens for home doses from kit # [id number] and [id number]. Instructed to return used pens with boxes to next appt. Pt verbalized understanding.    Assess dosing and administration conditions: Yes [x]  No[]  Instructed patient to dose on same day every month to avoid doses less than 28 days apart. DFUs used for reference. Instructed patient to start dosing on the 18th each month to stay on schedule. Patient verbalized understanding.    Ensure updated contact person list: [x] Yes [] No   Dosing diary updated: [x] Yes [] No    Current Outpatient Medications:    aspirin  81 MG tablet, Take 81 mg by mouth daily., Disp: , Rfl:    Coenzyme Q10 (COQ10 PO), Take 1 tablet by mouth daily., Disp: , Rfl:    cyclobenzaprine  (FLEXERIL ) 5 MG tablet, Take 1 tablet by mouth 3  (three) times daily as needed for muscle spasms., Disp: , Rfl:    empagliflozin  (JARDIANCE ) 10 MG TABS tablet, Take 1 tablet (10 mg total) by mouth daily before breakfast., Disp: 90 tablet, Rfl: 3   escitalopram  (LEXAPRO ) 10 MG tablet, Take 10 mg by mouth daily., Disp: , Rfl:    ezetimibe  (ZETIA ) 10 MG tablet, Take 1 tablet (10 mg total) by mouth daily., Disp: 90 tablet, Rfl: 3   famotidine  (PEPCID ) 20 MG tablet, Take 20 mg by mouth daily. , Disp: , Rfl:    fluticasone  (FLONASE ) 50 MCG/ACT nasal spray, Place 2 sprays into both nostrils daily as needed for allergies., Disp: , Rfl:    lisinopril -hydrochlorothiazide  (ZESTORETIC ) 20-12.5 MG tablet, Take 1 tablet by mouth daily., Disp: 90 tablet, Rfl: 2   meloxicam  (MOBIC ) 15 MG tablet, Take 15 mg by mouth daily., Disp: , Rfl:    metFORMIN  (GLUCOPHAGE ) 500 MG tablet, Take 1,000 mg by mouth 2 (two) times daily with a meal. , Disp: , Rfl:    metoprolol  succinate (TOPROL -XL) 50 MG 24 hr tablet, Take 1 tablet (50 mg total) by mouth daily., Disp: 90 tablet, Rfl: 3   nitroGLYCERIN  (NITROSTAT ) 0.4 MG SL tablet, Place 1 tablet (0.4 mg total) under the tongue every 5 (five) minutes as needed., Disp: 25 tablet, Rfl: 11   rosuvastatin  (CRESTOR ) 20 MG tablet, Take 1 tablet (20 mg total) by mouth daily., Disp: 90 tablet, Rfl: 3   sitaGLIPtin (JANUVIA) 100 MG tablet, Take 50 mg by mouth daily., Disp: , Rfl:    Study - ARTEMIS - ziltivekimab 15 mg/0.5  mL or placebo SQ injection (PI-Christopher), Inject 15 mg into the skin every 28 (twenty-eight) days. For Investigational Use Only. May be administered in the thigh, abdomen, or upper arm, at any time of day irrespective of meals. Injections are to be administered on a flat skin surface. Should be injected once monthly on the same day of the month throughout the treatment period (scheduled dosing day), Disp: , Rfl:    Study - ARTEMIS - ziltivekimab 15 mg/0.5 mL or placebo SQ injection (PI-Christopher), Inject 0.5 mLs (15 mg  total) into the skin every 28 (twenty-eight) days. For Investigational Use Only., Disp: 2 mL, Rfl: 0   ticagrelor  (BRILINTA ) 90 MG TABS tablet, Take 1 tablet (90 mg total) by mouth 2 (two) times daily., Disp: 180 tablet, Rfl: 3

## 2024-03-11 NOTE — Research (Addendum)
Are there any labs that are clinically significant?  Yes [] OR No[x]  Is the patient eligible to continue enrollment in the study after screening visit?  Yes [x]  OR No[]          

## 2024-03-28 ENCOUNTER — Telehealth: Payer: Self-pay | Admitting: Emergency Medicine

## 2024-03-28 NOTE — Telephone Encounter (Signed)
 Reviewed with Dr Francyne and he recommends office visit tomorrow, use of NTG if needed and to go to ED if chest pain not relieved by NTG.    I spoke with patient and gave him this information.  Appointment scheduled for patient to see Dr Santo (DOD) tomorrow--July 25 at 3:40.  Patient confirms he has NTG at home.

## 2024-03-28 NOTE — Telephone Encounter (Signed)
 I spoke with patient.  He reports for the last 2 weeks he has been having chest pain.  Occurs every day this past week.  Some days he has had pain twice in a day and one day had 3 episodes. Happens sporadically. Mostly late in the evening after eating and lying down.  Has severe pain that goes down his whole left arm.  Describes as pulsating.  Will also have chest pain that feels like heaviness and  heartburn.  Took Pepto bismal with no relief.  Last episode was last night.  BP elevated as noted below with pain.  Pain lasted about 45 minutes and eventually went away and he was able to go to sleep.  Did have one episode recently while pushing grocery cart in the store.  He slowed down and pain went away.   He has not used NTG.  Use of NTG reviewed with patient.  No pain at current time. ED precautions reviewed with patient. Will review with Dr Francyne.

## 2024-03-28 NOTE — Telephone Encounter (Signed)
   Pt c/o of Chest Pain: STAT if active CP, including tightness, pressure, jaw pain, radiating pain to shoulder/upper arm/back, CP unrelieved by Nitro. Symptoms reported of SOB, nausea, vomiting, sweating.  1. Are you having CP right now? Been having chest pains and left arm pains -not at this time   2. Are you experiencing any other symptoms (ex. SOB, nausea, vomiting, sweating)? No patient says when he have chest and arm pain, he noticed his blood pressure is extremely high, last night it was 202/107 another time 176/98 and 158/94   3. Is your CP continuous or coming and going? comes and goes   4. Have you taken Nitroglycerin ? no   5. How long have you been experiencing CP? About a week    6. If NO CP at time of call then end call with telling Pt to call back or call 911 if Chest pain returns prior to return call from triage team.

## 2024-03-29 ENCOUNTER — Ambulatory Visit: Attending: Internal Medicine | Admitting: Internal Medicine

## 2024-03-29 VITALS — BP 105/70 | HR 77 | Ht 66.0 in | Wt 240.0 lb

## 2024-03-29 DIAGNOSIS — N1832 Chronic kidney disease, stage 3b: Secondary | ICD-10-CM | POA: Insufficient documentation

## 2024-03-29 DIAGNOSIS — Z006 Encounter for examination for normal comparison and control in clinical research program: Secondary | ICD-10-CM | POA: Insufficient documentation

## 2024-03-29 DIAGNOSIS — R079 Chest pain, unspecified: Secondary | ICD-10-CM | POA: Diagnosis not present

## 2024-03-29 DIAGNOSIS — I2510 Atherosclerotic heart disease of native coronary artery without angina pectoris: Secondary | ICD-10-CM | POA: Diagnosis not present

## 2024-03-29 DIAGNOSIS — I255 Ischemic cardiomyopathy: Secondary | ICD-10-CM | POA: Insufficient documentation

## 2024-03-29 DIAGNOSIS — I2129 ST elevation (STEMI) myocardial infarction involving other sites: Secondary | ICD-10-CM | POA: Diagnosis not present

## 2024-03-29 DIAGNOSIS — I10 Essential (primary) hypertension: Secondary | ICD-10-CM | POA: Diagnosis not present

## 2024-03-29 DIAGNOSIS — I440 Atrioventricular block, first degree: Secondary | ICD-10-CM | POA: Diagnosis not present

## 2024-03-29 DIAGNOSIS — I25119 Atherosclerotic heart disease of native coronary artery with unspecified angina pectoris: Secondary | ICD-10-CM | POA: Insufficient documentation

## 2024-03-29 DIAGNOSIS — I252 Old myocardial infarction: Secondary | ICD-10-CM | POA: Diagnosis not present

## 2024-03-29 DIAGNOSIS — I429 Cardiomyopathy, unspecified: Secondary | ICD-10-CM | POA: Insufficient documentation

## 2024-03-29 DIAGNOSIS — E785 Hyperlipidemia, unspecified: Secondary | ICD-10-CM | POA: Insufficient documentation

## 2024-03-29 DIAGNOSIS — I77810 Thoracic aortic ectasia: Secondary | ICD-10-CM | POA: Diagnosis not present

## 2024-03-29 DIAGNOSIS — I129 Hypertensive chronic kidney disease with stage 1 through stage 4 chronic kidney disease, or unspecified chronic kidney disease: Secondary | ICD-10-CM | POA: Diagnosis not present

## 2024-03-29 DIAGNOSIS — E1122 Type 2 diabetes mellitus with diabetic chronic kidney disease: Secondary | ICD-10-CM | POA: Diagnosis not present

## 2024-03-29 MED ORDER — HYDROCHLOROTHIAZIDE 12.5 MG PO CAPS: 12.5000 mg | ORAL_CAPSULE | Freq: Every day | ORAL | 3 refills | Status: DC

## 2024-03-29 NOTE — Patient Instructions (Signed)
 Medication Instructions:  Your physician has recommended you make the following change in your medication:  STOP: lisinopril - hydrochlorothiazide  (Zestoretic )  START: hydrochlorothiazide  12.5 mg by mouth once daily *If you need a refill on your cardiac medications before your next appointment, please call your pharmacy*  Lab Work: BMP and CBC at any [facility]  If you have labs (blood work) drawn today and your tests are completely normal, you will receive your results only by: MyChart Message (if you have MyChart) OR A paper copy in the mail If you have any lab test that is abnormal or we need to change your treatment, we will call you to review the results.  Testing/Procedures: Your physician has requested that you have a cardiac catheterization. Cardiac catheterization is used to diagnose and/or treat various heart conditions. Doctors may recommend this procedure for a number of different reasons. The most common reason is to evaluate chest pain. Chest pain can be a symptom of coronary artery disease (CAD), and cardiac catheterization can show whether plaque is narrowing or blocking your heart's arteries. This procedure is also used to evaluate the valves, as well as measure the blood flow and oxygen levels in different parts of your heart. For further information please visit [email or web address] Please follow instruction sheet, as given.  We will call you on Monday morning to schedule Heart Cath.   Follow-Up: At [facility], you and your health needs are our priority.  As part of our continuing mission to provide you with exceptional heart care, our providers are all part of one team.  This team includes your primary Cardiologist (physician) and Advanced Practice Providers or APPs (Physician Assistants and Nurse Practitioners) who all work together to provide you with the care you need, when you need it.  Your next appointment:   8 week(s)  Provider:   Jerel Balding, MD or  One of our Advanced Practice Providers (APPs): Morse Clause, PA-C  Lamarr Satterfield, NP Miriam Shams, NP  Olivia Pavy, PA-C Josefa Beauvais, NP  Leontine Salen, PA-C Orren Fabry, PA-C  [place], PA-C Ernest Dick, NP  Damien Braver, NP Jon Hails, PA-C  Waddell Donath, PA-C    Dayna Dunn, PA-C  Glendia Ferrier, PA-C Lum Louis, NP Katlyn West, NP Callie Goodrich, PA-C  Evan Williams, PA-C Sheng Haley, PA-C  Xika Zhao, NP Kathleen Johnson, PA-C

## 2024-03-29 NOTE — Progress Notes (Signed)
 Cardiology Office Note:  .    Date:  03/29/2024  ID:  Devon Wood, DOB [birth year], MRN [id number] PCP: Ransom Other, MD  [facility] Providers Cardiologist:  Jerel Balding, MD     CC: DOD- CP  History of Present Illness: .    Devon Wood is a 63 y.o. male  with coronary artery disease and type 2 diabetes who presents with chest pain.  He has a history of coronary artery disease with complex residual disease and underwent heart catheterization earlier this year. He is on dual antiplatelet therapy with aspirin  and Brilinta , and is managed with Zetia , ristevastatin, and metoprolol .  He has been experiencing chest pain for the past two weeks, occurring in the evening after lying down, which does not improve with Pepto Bismol or GERD interventions. He uses nitroglycerin  for relief. During episodes of pain, his blood pressure has been elevated, reaching as high as 202/102, but is low today.  He has mild aortic dilation, but a CT aorta in 2025 showed no evidence of dissection. His LDL is at goal on current therapy.  He has type 2 diabetes managed with Jardiance  and Januvia, with a recent A1c of 9.5. He also has chronic kidney disease stage 3B with previously well-controlled hypertension.  He reports feeling generally unwell since starting lisinopril , with symptoms of belching and burping, and believes his symptoms may be related to this medication.  Discussed the use of AI scribe software for clinical note transcription with the patient, who gave verbal consent to proceed.   Relevant histories: .  Social  - MCR patient; married; has transport issues ROS: As per HPI.   Studies Reviewed: .     Cardiac Studies & Procedures   ______________________________________________________________________________________________ CARDIAC CATHETERIZATION  CARDIAC CATHETERIZATION 09/06/2023  Conclusion   Recommend uninterrupted dual antiplatelet therapy with Aspirin  81mg  daily and  Ticagrelor  90mg  twice daily for a minimum of 12 months (ACS-Class I recommendation).  Coronary intervention 09/06/2023: LM: Normal LAD: Proximal LAD stent with 40% late lumen loss. Diffuse mid LAD 60% disease, followed by focal 80% stenosis in mid-distal LAD. Ramus intermedius: Proximal 99% thrombotic stenosis (culprit lesion) Lateral ramus branch with proximal 50% stenosis LCx: Small OM1 with ostial 80% stenosis Moderate sized OM2 with diffuse 70% disease RCA: Ostial/proximal RPDA diffuse 90% stenosis RPL 1 proximal 80% stenosis  Successful percutaneous coronary intervention prox ramus PTCA and stent placement 2.5 X 16 mm Synergy drug-eluting stent Post dilatation using 2.75X12 mm [place] balloon up to 16 atm TIMI flow I-->III Culprit stenosis 99%-->0%    There are residual lesions in ostial/proximal right PDA (90%), RPL 1 (80%), Small OM1 (ostial 80%), medium sized OM2 (70%)as well as mid to distal LAD (60-80%).  Most of these lesions are not ideal for PCI given ostial location of the PDA lesion with size mismatch compared to distal RCA, and other diffuse nature of mid LAD disease proximal to the focal 80% lesion.  We will assess the patient for angina symptoms with ambulation before deciding on whether he would need nonculprit artery revascularization.  Please note that the patient anatomy related delays with regards to difficulty obtaining radial artery access, difficulty with guide engagement owing to dilated aortic root.  Devon JINNY Lawrence, MD  Findings Coronary Findings Diagnostic  Dominance: Right  Left Anterior Descending Prox LAD lesion is 40% stenosed. The lesion was previously treated over 2 years ago. Mid LAD lesion is 60% stenosed. Mid LAD to Dist LAD lesion is 80% stenosed.  Ramus  Intermedius Vessel is moderate in size. Ramus-1 lesion is 99% stenosed. The lesion is thrombotic. Ramus-2 lesion is 50% stenosed.  Lateral Ramus Intermedius  Left Circumflex  First  Obtuse Marginal Branch Vessel is small in size. 1st Mrg lesion is 80% stenosed.  Second Obtuse Marginal Branch Vessel is moderate in size. 2nd Mrg lesion is 70% stenosed.  Right Coronary Artery  Right Posterior Descending Artery RPDA lesion is 90% stenosed.  First Right Posterolateral Branch 1st RPL lesion is 80% stenosed.  Intervention  Ramus-1 lesion Stent CATH LAUNCHER 6FR EBU 3.75 guide catheter was inserted. Lesion crossed with guidewire using a WIRE ASAHI PROWATER 180CM. Pre-stent angioplasty was performed using a BALLN EMERGE MR 2.5X15. A drug-eluting stent was successfully placed using a SYNERGY XD 2.50X16. Maximum pressure: 12 atm. Inflation time: 30 sec. Stent strut is well apposed. Post-stent angioplasty was performed using a BALLN [place] EMERGE MR [id number]. Maximum pressure:  16 atm. Inflation time:  20 sec. Post-Intervention Lesion Assessment The intervention was successful. Pre-interventional TIMI flow is 1. Post-intervention TIMI flow is 3. No complications occurred at this lesion. There is a 0% residual stenosis post intervention.   STRESS TESTS  NM MYOCAR MULTI W/SPECT W 05/19/2004   ECHOCARDIOGRAM  ECHOCARDIOGRAM COMPLETE 12/07/2023  Narrative ECHOCARDIOGRAM REPORT    Patient Name:   Devon Wood Date of Exam: 12/07/2023 Medical Rec #:  [id number]     Height:       67.0 in Accession #:    [id number]    Weight:       237.0 lb Date of Birth:  [birth year]     BSA:          2.174 m Patient Age:    62 years      BP:           110/70 mmHg Patient Gender: M             HR:           73 bpm. Exam Location:  Outpatient  Procedure: 2D Echo, 3D Echo, Color Doppler, Cardiac Doppler and Intracardiac Opacification Agent (Both Spectral and Color Flow Doppler were utilized during procedure).  Indications:    R60.0 Lower extremity edema  History:        Patient has prior history of Echocardiogram examinations, most recent 09/07/2023. CAD and Previous Myocardial  Infarction, Signs/Symptoms:Edema; Risk Factors:Hypertension, Diabetes, CKD stage 3, Dyslipidemia and Former Smoker. Patient denies chest pain and SOB. He does have mild leg edema.  Sonographer:    Annabella Cater RVT, RDCS (AE), RDMS Referring Phys: [id number] MADISON L FOUNTAIN   Sonographer Comments: Suboptimal apical window, patient is obese and suboptimal parasternal window. Image acquisition challenging due to patient body habitus. IMPRESSIONS   1. Left ventricular ejection fraction, by estimation, is 60 to 65%. Left ventricular ejection fraction by 3D volume is 64 %. The left ventricle has normal function. The left ventricle has no regional wall motion abnormalities. Left ventricular diastolic parameters are indeterminate. 2. Right ventricular systolic function is normal. The right ventricular size is normal. There is normal pulmonary artery systolic pressure. The estimated right ventricular systolic pressure is 29.2 mmHg. 3. The mitral valve is normal in structure. Trivial mitral valve regurgitation. No evidence of mitral stenosis. 4. The aortic valve is tricuspid. Aortic valve regurgitation is not visualized. No aortic stenosis is present. 5. Aortic dilatation noted. There is dilatation of the aortic root, measuring 43 mm. There is dilatation of the ascending aorta, measuring 39 mm. 6. The  inferior vena cava is normal in size with greater than 50% respiratory variability, suggesting right atrial pressure of 3 mmHg.  Comparison(s): EF 50%, hypokinesis anterolateral and mid-apical anterior walls, AOR 44mm.  FINDINGS Left Ventricle: Left ventricular ejection fraction, by estimation, is 60 to 65%. Left ventricular ejection fraction by 3D volume is 64 %. The left ventricle has normal function. The left ventricle has no regional wall motion abnormalities. Definity  contrast agent was given IV to delineate the left ventricular endocardial borders. The left ventricular internal cavity size was  normal in size. There is no left ventricular hypertrophy. Left ventricular diastolic parameters are indeterminate.  Right Ventricle: The right ventricular size is normal. No increase in right ventricular wall thickness. Right ventricular systolic function is normal. There is normal pulmonary artery systolic pressure. The tricuspid regurgitant velocity is 2.56 m/s, and with an assumed right atrial pressure of 3 mmHg, the estimated right ventricular systolic pressure is 29.2 mmHg.  Left Atrium: Left atrial size was normal in size.  Right Atrium: Right atrial size was normal in size.  Pericardium: There is no evidence of pericardial effusion. Presence of epicardial fat layer.  Mitral Valve: The mitral valve is normal in structure. Trivial mitral valve regurgitation. No evidence of mitral valve stenosis.  Tricuspid Valve: The tricuspid valve is normal in structure. Tricuspid valve regurgitation is trivial.  Aortic Valve: The aortic valve is tricuspid. Aortic valve regurgitation is not visualized. No aortic stenosis is present. Aortic valve mean gradient measures 4.0 mmHg. Aortic valve peak gradient measures 9.0 mmHg. Aortic valve area, by VTI measures 2.85 cm.  Pulmonic Valve: The pulmonic valve was not well visualized. Pulmonic valve regurgitation is not visualized.  Aorta: Aortic dilatation noted. There is dilatation of the aortic root, measuring 43 mm. There is dilatation of the ascending aorta, measuring 39 mm.  Venous: The inferior vena cava is normal in size with greater than 50% respiratory variability, suggesting right atrial pressure of 3 mmHg.  IAS/Shunts: The interatrial septum was not well visualized.  Additional Comments: 3D was performed not requiring image post processing on an independent workstation and was normal.   LEFT VENTRICLE PLAX 2D LVIDd:         5.13 cm         Diastology LVIDs:         2.81 cm         LV e' medial:    5.98 cm/s LV PW:         0.76 cm          LV E/e' medial:  14.1 LV IVS:        0.90 cm         LV e' lateral:   7.76 cm/s LVOT diam:     2.39 cm         LV E/e' lateral: 10.9 LV SV:         84 LV SV Index:   39 LVOT Area:     4.49 cm        3D Volume EF LV 3D EF:    Left ventricul ar ejection fraction by 3D volume is 64 %.  3D Volume EF: 3D EF:        64 % LV EDV:       205 ml LV ESV:       75 ml LV SV:        130 ml  RIGHT VENTRICLE RV S prime:     15.30 cm/s TAPSE (M-mode):  2.4 cm  LEFT ATRIUM             Index        RIGHT ATRIUM           Index LA diam:        4.22 cm 1.94 cm/m   RA Area:     16.20 cm LA Vol (A2C):   35.7 ml 16.42 ml/m  RA Volume:   43.60 ml  20.06 ml/m LA Vol (A4C):   57.3 ml 26.36 ml/m LA Biplane Vol: 46.7 ml 21.49 ml/m AORTIC VALVE                    PULMONIC VALVE AV Area (Vmax):    2.94 cm     PV Vmax:       1.02 m/s AV Area (Vmean):   2.61 cm     PV Peak grad:  4.1 mmHg AV Area (VTI):     2.85 cm AV Vmax:           150.00 cm/s AV Vmean:          95.100 cm/s AV VTI:            0.294 m AV Peak Grad:      9.0 mmHg AV Mean Grad:      4.0 mmHg LVOT Vmax:         98.40 cm/s LVOT Vmean:        55.400 cm/s LVOT VTI:          0.187 m LVOT/AV VTI ratio: 0.64  AORTA Ao Root diam: 4.32 cm Ao Asc diam:  3.90 cm Ao Arch diam: 3.4 cm  MITRAL VALVE               TRICUSPID VALVE MV Area (PHT): 3.12 cm    TR Peak grad:   26.2 mmHg MV Decel Time: 243 msec    TR Vmax:        256.00 cm/s MV E velocity: 84.60 cm/s MV A velocity: 95.30 cm/s  SHUNTS MV E/A ratio:  0.89        Systemic VTI:  0.19 m Systemic Diam: 2.39 cm  Lonni Nanas MD Electronically signed by Lonni Nanas MD Signature Date/Time: 12/07/2023/2:11:19 PM    Final          ______________________________________________________________________________________________        Physical Exam:    VS:  BP 105/70 (BP Location: Right Arm)   Pulse 77   Ht 5' 6 (1.676 m)   Wt 240 lb (108.9 kg)    SpO2 96%   BMI 38.74 kg/m    Wt Readings from Last 3 Encounters:  03/29/24 240 lb (108.9 kg)  12/15/23 240 lb (108.9 kg)  09/14/23 237 lb (107.5 kg)    Gen: no distress, morbid obesity   Neck: No JVD Cardiac: No Rubs or Gallops, RRR, but distant heart sounds Respiratory: Clear to auscultation bilaterally, normal effort, normal  respiratory rate GI: Soft, nontender, non-distended  MS: No  edema;  moves all extremities Integument: Skin feels warm Neuro:  At time of evaluation, alert and oriented to person/place/time/situation  Psych: Normal affect, patient feels ok   ASSESSMENT AND PLAN: .     An EKG was ordered for CP and shows stable ST depressions  Coronary artery disease with angina Chronic coronary artery disease with complex residual disease and multiple targets. Recent exacerbation of angina with atypical chest pain in the evening after lying down, unrelieved by nitroglycerin   or GERD interventions. EKG shows sinus rhythm with first degree heart block and slight ST depression in V2, unchanged from January. Significant symptoms and history warrant heart catheterization. Intervention may not be feasible due to disease complexity. - Proceed with heart catheterization from a right femoral approach, ideally scheduled for Tuesday, July 29th or Wednesday, July 30th. - Provide ED and red flag precautions. - Hold lisinopril  due to concerns about symptoms, although it is not suspected to be the cause of angina.  Hypertension Previously well-controlled hypertension, now with episodes of elevated blood pressure, notably 202/102 mmHg on Wednesday night. Currently, blood pressure is low. Relationship between blood pressure and angina symptoms noted.  First degree heart block First degree heart block noted on EKG, unchanged from previous assessment in January.    Chronic kidney disease, stage 3B - we have discussed based off his GFR we may not intervene next week.  Type 2 diabetes  mellitus with poor control Type 2 diabetes managed by primary care with Jardiance  and Januvia. Recent A1c is 9.5, indicating poor control.  Mild aortic dilation Mild aortic dilation with previous CTA showing no evidence of dissection.  We have reviewed ED precautions.    Post cath f/u with Dr. Tyrone team  Stanly Leavens, MD FASE [facility] Cardiologist [facility]  [facility]  [address] [place], [address] [place], [place] [address] [phone]  5:23 PM

## 2024-04-01 ENCOUNTER — Telehealth: Payer: Self-pay

## 2024-04-01 ENCOUNTER — Telehealth: Payer: Self-pay | Admitting: *Deleted

## 2024-04-01 NOTE — Telephone Encounter (Signed)
 Called pt reviewed Heart Cath instructions also placed on my chart for review.  All questions answered.

## 2024-04-01 NOTE — Telephone Encounter (Signed)
 Cardiac Catheterization scheduled at [facility] for: Wednesday April 03, 2024 1 PM Arrival time [facility] Main Entrance A at: 11 AM  Nothing to eat after midnight prior to procedure, clear liquids until 5 AM day of procedure.  Medication instructions: -Hold:  Metformin -day of procedure and 48 hours post procedure  Januvia/Jardiance /hydrochlorothiazide -AM of procedure -Other usual morning medications can be taken with sips of water including aspirin  81 mg and Brilinta  90 mg.  Plan to go home the same day, you will only stay overnight if medically necessary.  You must have responsible adult to drive you home.  Someone must be with you the first 24 hours after you arrive home.  Reviewed procedure instructions with patient.

## 2024-04-02 ENCOUNTER — Ambulatory Visit: Payer: Self-pay | Admitting: Cardiology

## 2024-04-02 ENCOUNTER — Telehealth: Payer: Self-pay | Admitting: *Deleted

## 2024-04-02 LAB — BASIC METABOLIC PANEL WITH GFR
BUN/Creatinine Ratio: 15 (ref 10–24)
BUN: 38 mg/dL — ABNORMAL HIGH (ref 8–27)
CO2: 17 mmol/L — ABNORMAL LOW (ref 20–29)
Calcium: 9.4 mg/dL (ref 8.6–10.2)
Chloride: 99 mmol/L (ref 96–106)
Creatinine, Ser: 2.46 mg/dL — AB (ref 0.76–1.27)
Glucose: 183 mg/dL — AB (ref 70–99)
Potassium: 4.8 mmol/L (ref 3.5–5.2)
Sodium: 135 mmol/L (ref 134–144)
eGFR: 29 mL/min/1.73 — AB (ref >59)

## 2024-04-02 LAB — CBC
Hematocrit: 38.4 % (ref 37.5–51.0)
Hemoglobin: 12.2 g/dL — ABNORMAL LOW (ref 13.0–17.7)
MCH: 29 pg (ref 26.6–33.0)
MCHC: 31.8 g/dL (ref 31.5–35.7)
MCV: 91 fL (ref 79–97)
Platelets: 216 x10E3/uL (ref 150–450)
RBC: 4.21 x10E6/uL (ref 4.14–5.80)
RDW: 13.3 % (ref 11.6–15.4)
WBC: 8.6 x10E3/uL (ref 3.4–10.8)

## 2024-04-02 NOTE — Progress Notes (Signed)
 Agree. Recommend increased oral hydration leading into the cath and additional IV hydration pre procedure on the day of cath.  Thanks MJP

## 2024-04-02 NOTE — H&P (View-Only) (Signed)
 Agree. Recommend increased oral hydration leading into the cath and additional IV hydration pre procedure on the day of cath.  Thanks MJP

## 2024-04-02 NOTE — Telephone Encounter (Addendum)
 Cardiac Catheterization scheduled at [facility] for: Wednesday April 03, 2024 4 PM-(time change to allow time for pre-procedure hydration) Arrival time [facility] Main Entrance A at: 61 AM-pre-procedure hydration-pt tells me he cannot be at the hospital before 11 AM  May have light meal until 10 AM, may have clear liquids until leaving for hospital.  Medication instructions: -Hold:  Metformin -day of procedure and 48 hours post procedure  Januvia/Jardiance -AM of procedure  Mobic /hydrochlorothiazide -day before and day of procedure-per protocol-GFR <60 (29) -Other usual morning medications can be taken with sips of water including aspirin  81 mg and Brilinta  90 mg  Plan to go home the same day, you will only stay overnight if medically necessary.  You must have responsible adult to drive you home.  Someone must be with you the first 24 hours after you arrive home.  Reviewed procedure instructions, pre-procedure hydration with patient.

## 2024-04-03 ENCOUNTER — Observation Stay (HOSPITAL_COMMUNITY)
Admission: AD | Admit: 2024-04-03 | Discharge: 2024-04-04 | Disposition: A | Discharge status: Home / Self Care | Attending: Cardiology | Admitting: Cardiology

## 2024-04-03 ENCOUNTER — Encounter (HOSPITAL_COMMUNITY): Payer: Self-pay | Admitting: Cardiology

## 2024-04-03 ENCOUNTER — Other Ambulatory Visit: Payer: Self-pay

## 2024-04-03 ENCOUNTER — Encounter (HOSPITAL_COMMUNITY)
Admission: AD | Disposition: A | Discharge status: Home / Self Care | Payer: Self-pay | Source: Home / Self Care | Attending: Cardiology

## 2024-04-03 DIAGNOSIS — I25110 Atherosclerotic heart disease of native coronary artery with unstable angina pectoris: Secondary | ICD-10-CM | POA: Diagnosis not present

## 2024-04-03 DIAGNOSIS — R079 Chest pain, unspecified: Secondary | ICD-10-CM | POA: Diagnosis present

## 2024-04-03 DIAGNOSIS — E1122 Type 2 diabetes mellitus with diabetic chronic kidney disease: Secondary | ICD-10-CM | POA: Diagnosis not present

## 2024-04-03 DIAGNOSIS — I77819 Aortic ectasia, unspecified site: Secondary | ICD-10-CM | POA: Diagnosis not present

## 2024-04-03 DIAGNOSIS — E782 Mixed hyperlipidemia: Secondary | ICD-10-CM | POA: Insufficient documentation

## 2024-04-03 DIAGNOSIS — Z7901 Long term (current) use of anticoagulants: Secondary | ICD-10-CM | POA: Diagnosis not present

## 2024-04-03 DIAGNOSIS — E118 Type 2 diabetes mellitus with unspecified complications: Secondary | ICD-10-CM | POA: Diagnosis present

## 2024-04-03 DIAGNOSIS — N183 Chronic kidney disease, stage 3 unspecified: Secondary | ICD-10-CM | POA: Diagnosis present

## 2024-04-03 DIAGNOSIS — I2510 Atherosclerotic heart disease of native coronary artery without angina pectoris: Principal | ICD-10-CM | POA: Diagnosis present

## 2024-04-03 DIAGNOSIS — N1832 Chronic kidney disease, stage 3b: Secondary | ICD-10-CM | POA: Insufficient documentation

## 2024-04-03 DIAGNOSIS — I25118 Atherosclerotic heart disease of native coronary artery with other forms of angina pectoris: Secondary | ICD-10-CM | POA: Diagnosis not present

## 2024-04-03 DIAGNOSIS — I129 Hypertensive chronic kidney disease with stage 1 through stage 4 chronic kidney disease, or unspecified chronic kidney disease: Secondary | ICD-10-CM | POA: Insufficient documentation

## 2024-04-03 DIAGNOSIS — I10 Essential (primary) hypertension: Secondary | ICD-10-CM | POA: Diagnosis present

## 2024-04-03 HISTORY — PX: LEFT HEART CATH AND CORONARY ANGIOGRAPHY: CATH118249

## 2024-04-03 LAB — BASIC METABOLIC PANEL WITH GFR
Anion gap: 7 (ref 5–15)
Anion gap: 9 (ref 5–15)
BUN: 43 mg/dL — ABNORMAL HIGH (ref 8–23)
BUN: 41 mg/dL — ABNORMAL HIGH (ref 8–23)
CO2: 21 mmol/L — ABNORMAL LOW (ref 22–32)
CO2: 22 mmol/L (ref 22–32)
Calcium: 8.7 mg/dL — ABNORMAL LOW (ref 8.9–10.3)
Calcium: 8.9 mg/dL (ref 8.9–10.3)
Chloride: 105 mmol/L (ref 98–111)
Chloride: 104 mmol/L (ref 98–111)
Creatinine, Ser: 2.41 mg/dL — ABNORMAL HIGH (ref 0.61–1.24)
Creatinine, Ser: 2.46 mg/dL — ABNORMAL HIGH (ref 0.61–1.24)
GFR, Estimated: 30 mL/min — ABNORMAL LOW (ref >60)
GFR, Estimated: 29 mL/min — ABNORMAL LOW (ref >60)
Glucose, Bld: 164 mg/dL — ABNORMAL HIGH (ref 70–99)
Glucose, Bld: 136 mg/dL — ABNORMAL HIGH (ref 70–99)
Potassium: 4.4 mmol/L (ref 3.5–5.1)
Potassium: 4.4 mmol/L (ref 3.5–5.1)
Sodium: 133 mmol/L — ABNORMAL LOW (ref 135–145)
Sodium: 135 mmol/L (ref 135–145)

## 2024-04-03 LAB — GLUCOSE, CAPILLARY
Glucose-Capillary: 256 mg/dL — ABNORMAL HIGH (ref 70–99)
Glucose-Capillary: 125 mg/dL — ABNORMAL HIGH (ref 70–99)
Glucose-Capillary: 133 mg/dL — ABNORMAL HIGH (ref 70–99)
Glucose-Capillary: 247 mg/dL — ABNORMAL HIGH (ref 70–99)

## 2024-04-03 LAB — CBC
HCT: 36.7 % — ABNORMAL LOW (ref 39.0–52.0)
Hemoglobin: 12 g/dL — ABNORMAL LOW (ref 13.0–17.0)
MCH: 28.4 pg (ref 26.0–34.0)
MCHC: 32.7 g/dL (ref 30.0–36.0)
MCV: 86.8 fL (ref 80.0–100.0)
Platelets: 205 K/uL (ref 150–400)
RBC: 4.23 MIL/uL (ref 4.22–5.81)
RDW: 13.2 % (ref 11.5–15.5)
WBC: 7.6 K/uL (ref 4.0–10.5)
nRBC: 0 % (ref 0.0–0.2)

## 2024-04-03 LAB — POCT I-STAT, CHEM 8
BUN: 42 mg/dL — ABNORMAL HIGH (ref 8–23)
Calcium, Ion: 1.22 mmol/L (ref 1.15–1.40)
Chloride: 104 mmol/L (ref 98–111)
Creatinine, Ser: 2.6 mg/dL — ABNORMAL HIGH (ref 0.61–1.24)
Glucose, Bld: 182 mg/dL — ABNORMAL HIGH (ref 70–99)
HCT: 36 % — ABNORMAL LOW (ref 39.0–52.0)
Hemoglobin: 12.2 g/dL — ABNORMAL LOW (ref 13.0–17.0)
Potassium: 4.6 mmol/L (ref 3.5–5.1)
Sodium: 137 mmol/L (ref 135–145)
TCO2: 20 mmol/L — ABNORMAL LOW (ref 22–32)

## 2024-04-03 LAB — HEMOGLOBIN A1C
Hgb A1c MFr Bld: 9.2 % — ABNORMAL HIGH (ref 4.8–5.6)
Mean Plasma Glucose: 217.34 mg/dL

## 2024-04-03 MED ORDER — SODIUM CHLORIDE 0.9 % WEIGHT BASED INFUSION
3.0000 mL/kg/h | INTRAVENOUS | Status: AC
  Administered 2024-04-03: 3 mL/kg/h via INTRAVENOUS

## 2024-04-03 MED ORDER — ACETAMINOPHEN 325 MG PO TABS
650.0000 mg | ORAL_TABLET | ORAL | Status: DC | PRN
Start: 2024-04-03 — End: 2024-04-04

## 2024-04-03 MED ORDER — METOPROLOL SUCCINATE ER 50 MG PO TB24
50.0000 mg | ORAL_TABLET | Freq: Every day | ORAL | Status: DC
  Administered 2024-04-04: 50 mg via ORAL
  Filled 2024-04-03: qty 1

## 2024-04-03 MED ORDER — INSULIN ASPART 100 UNIT/ML IJ SOLN
3.0000 [IU] | Freq: Three times a day (TID) | INTRAMUSCULAR | Status: DC
  Administered 2024-04-04 (×3): 3 [IU] via SUBCUTANEOUS

## 2024-04-03 MED ORDER — ROSUVASTATIN CALCIUM 20 MG PO TABS
20.0000 mg | ORAL_TABLET | Freq: Every day | ORAL | Status: DC
  Administered 2024-04-04: 20 mg via ORAL
  Filled 2024-04-03: qty 1

## 2024-04-03 MED ORDER — ONDANSETRON HCL 4 MG/2ML IJ SOLN: 4.0000 mg | Freq: Four times a day (QID) | INTRAMUSCULAR | Status: DC | PRN

## 2024-04-03 MED ORDER — FLUTICASONE PROPIONATE 50 MCG/ACT NA SUSP: 2.0000 | Freq: Every day | NASAL | Status: DC | PRN

## 2024-04-03 MED ORDER — LABETALOL HCL 5 MG/ML IV SOLN: 10.0000 mg | INTRAVENOUS | Status: AC | PRN

## 2024-04-03 MED ORDER — IOHEXOL 350 MG/ML SOLN
INTRAVENOUS | Status: DC | PRN
  Administered 2024-04-03: 15 mL

## 2024-04-03 MED ORDER — ESCITALOPRAM OXALATE 10 MG PO TABS
10.0000 mg | ORAL_TABLET | Freq: Every day | ORAL | Status: DC
  Administered 2024-04-04: 10 mg via ORAL
  Filled 2024-04-03: qty 1

## 2024-04-03 MED ORDER — FAMOTIDINE 20 MG PO TABS
20.0000 mg | ORAL_TABLET | Freq: Every day | ORAL | Status: DC
  Administered 2024-04-04: 20 mg via ORAL
  Filled 2024-04-03: qty 1

## 2024-04-03 MED ORDER — HYDRALAZINE HCL 20 MG/ML IJ SOLN: 10.0000 mg | INTRAMUSCULAR | Status: AC | PRN

## 2024-04-03 MED ORDER — NITROGLYCERIN 0.4 MG SL SUBL: 0.4000 mg | SUBLINGUAL_TABLET | SUBLINGUAL | Status: DC | PRN

## 2024-04-03 MED ORDER — LIDOCAINE HCL (PF) 1 % IJ SOLN
INTRAMUSCULAR | Status: AC
  Filled 2024-04-03: qty 30

## 2024-04-03 MED ORDER — MIDAZOLAM HCL 2 MG/2ML IJ SOLN
INTRAMUSCULAR | Status: DC | PRN
  Administered 2024-04-03: 1 mg via INTRAVENOUS

## 2024-04-03 MED ORDER — ASPIRIN 81 MG PO TBEC
81.0000 mg | DELAYED_RELEASE_TABLET | Freq: Every day | ORAL | Status: DC
  Administered 2024-04-04: 81 mg via ORAL
  Filled 2024-04-03: qty 1

## 2024-04-03 MED ORDER — INSULIN ASPART 100 UNIT/ML IJ SOLN
0.0000 [IU] | Freq: Every day | INTRAMUSCULAR | Status: DC
  Administered 2024-04-03: 2 [IU] via SUBCUTANEOUS

## 2024-04-03 MED ORDER — SODIUM CHLORIDE 0.9% FLUSH
3.0000 mL | Freq: Two times a day (BID) | INTRAVENOUS | Status: DC
  Administered 2024-04-03 – 2024-04-04 (×2): 3 mL via INTRAVENOUS

## 2024-04-03 MED ORDER — VERAPAMIL HCL 2.5 MG/ML IV SOLN
INTRAVENOUS | Status: AC
  Filled 2024-04-03: qty 2

## 2024-04-03 MED ORDER — INSULIN ASPART 100 UNIT/ML IJ SOLN
0.0000 [IU] | Freq: Three times a day (TID) | INTRAMUSCULAR | Status: DC
  Administered 2024-04-03: 1 [IU] via SUBCUTANEOUS
  Administered 2024-04-04: 3 [IU] via SUBCUTANEOUS
  Administered 2024-04-04: 1 [IU] via SUBCUTANEOUS
  Administered 2024-04-04: 2 [IU] via SUBCUTANEOUS

## 2024-04-03 MED ORDER — LIDOCAINE HCL (PF) 1 % IJ SOLN
INTRAMUSCULAR | Status: DC | PRN
  Administered 2024-04-03: 5 mL via INTRADERMAL

## 2024-04-03 MED ORDER — TICAGRELOR 90 MG PO TABS: 90.0000 mg | ORAL_TABLET | ORAL | Status: DC

## 2024-04-03 MED ORDER — SODIUM CHLORIDE 0.9% FLUSH: 3.0000 mL | INTRAVENOUS | Status: DC | PRN

## 2024-04-03 MED ORDER — SODIUM CHLORIDE 0.9 % IV SOLN: 250.0000 mL | INTRAVENOUS | Status: DC | PRN

## 2024-04-03 MED ORDER — MIDAZOLAM HCL 2 MG/2ML IJ SOLN
INTRAMUSCULAR | Status: AC
  Filled 2024-04-03: qty 2

## 2024-04-03 MED ORDER — EZETIMIBE 10 MG PO TABS
10.0000 mg | ORAL_TABLET | Freq: Every day | ORAL | Status: DC
  Administered 2024-04-04: 10 mg via ORAL
  Filled 2024-04-03: qty 1

## 2024-04-03 MED ORDER — SODIUM CHLORIDE 0.9 % WEIGHT BASED INFUSION: 1.0000 mL/kg/h | INTRAVENOUS | Status: DC

## 2024-04-03 MED ORDER — HEPARIN SODIUM (PORCINE) 5000 UNIT/ML IJ SOLN
5000.0000 [IU] | Freq: Three times a day (TID) | INTRAMUSCULAR | Status: DC
  Administered 2024-04-03 – 2024-04-04 (×3): 5000 [IU] via SUBCUTANEOUS
  Filled 2024-04-03 (×3): qty 1

## 2024-04-03 MED ORDER — ASPIRIN 81 MG PO CHEW: 81.0000 mg | CHEWABLE_TABLET | ORAL | Status: DC

## 2024-04-03 MED ORDER — FENTANYL CITRATE (PF) 100 MCG/2ML IJ SOLN
INTRAMUSCULAR | Status: AC
  Filled 2024-04-03: qty 2

## 2024-04-03 MED ORDER — HEPARIN (PORCINE) IN NACL 2000-0.9 UNIT/L-% IV SOLN
INTRAVENOUS | Status: DC | PRN
  Administered 2024-04-03: 1000 mL

## 2024-04-03 MED ORDER — SODIUM CHLORIDE 0.9 % IV SOLN: INTRAVENOUS | Status: AC

## 2024-04-03 MED ORDER — HEPARIN SODIUM (PORCINE) 1000 UNIT/ML IJ SOLN
INTRAMUSCULAR | Status: AC
  Filled 2024-04-03: qty 10

## 2024-04-03 MED ORDER — FENTANYL CITRATE (PF) 100 MCG/2ML IJ SOLN
INTRAMUSCULAR | Status: DC | PRN
  Administered 2024-04-03: 25 ug via INTRAVENOUS
  Administered 2024-04-03: 50 ug via INTRAVENOUS

## 2024-04-03 SURGICAL SUPPLY — 9 items
CATH INFINITI 5FR MULTPACK ANG (CATHETERS) IMPLANT
GLIDESHEATH SLEND A-KIT 6F 22G (SHEATH) IMPLANT
GUIDEWIRE INQWIRE 1.5J.035X260 (WIRE) IMPLANT
KIT MICROPUNCTURE NIT STIFF (SHEATH) IMPLANT
PACK CARDIAC CATHETERIZATION (CUSTOM PROCEDURE TRAY) ×1 IMPLANT
SET ATX-X65L (MISCELLANEOUS) IMPLANT
SHEATH PINNACLE 5F 10CM (SHEATH) IMPLANT
SHEATH PROBE COVER 6X72 (BAG) IMPLANT
WIRE EMERALD 3MM-J .035X150CM (WIRE) IMPLANT

## 2024-04-03 NOTE — Interval H&P Note (Signed)
 History and Physical Interval Note:  04/03/2024 3:41 PM  Devon Wood  has presented today for surgery, with the diagnosis of chest pain.  The various methods of treatment have been discussed with the patient and family. After consideration of risks, benefits and other options for treatment, the patient has consented to  Procedure(s): LEFT HEART CATH AND CORONARY ANGIOGRAPHY (N/A) as a surgical intervention.  The patient's history has been reviewed, patient examined, no change in status, stable for surgery.  I have reviewed the patient's chart and labs.  Questions were answered to the patient's satisfaction.     Hobert Poplaski J Dianna Ewald

## 2024-04-03 NOTE — Interval H&P Note (Signed)
 History and Physical Interval Note:  04/03/2024 3:42 PM  Devon Wood  has presented today for surgery, with the diagnosis of chest pain.  The various methods of treatment have been discussed with the patient and family. After consideration of risks, benefits and other options for treatment, the patient has consented to  Procedure(s): LEFT HEART CATH AND CORONARY ANGIOGRAPHY (N/A) as a surgical intervention.  The patient's history has been reviewed, patient examined, no change in status, stable for surgery.  I have reviewed the patient's chart and labs.  Questions were answered to the patient's satisfaction.    Complex multivessel disease with diffuse disease in distal LAD precluding ideal bypass target.  Given his kidney function, patient is not keen on considering bypass surgery.  We discussed treatment options.  We mutually agreed on staging if he would require multivessel PCI.  Laurella Tull J Rashia Mckesson

## 2024-04-03 NOTE — Progress Notes (Signed)
 Site area: R femoral (arterial) Site Prior to Removal:  Level 0 Pressure Applied For: Manual:   yes Patient Status During Pull:  stable Post Pull Site:  Level 0 Post Pull Instructions Given:  yes Post Pull Pulses Present: R DP +2 Dressing Applied:  gauze & tegaderm Bedrest begins @ 1715 Comments:

## 2024-04-04 ENCOUNTER — Inpatient Hospital Stay (HOSPITAL_BASED_OUTPATIENT_CLINIC_OR_DEPARTMENT_OTHER)

## 2024-04-04 ENCOUNTER — Ambulatory Visit: Payer: Self-pay | Admitting: Cardiology

## 2024-04-04 ENCOUNTER — Inpatient Hospital Stay (HOSPITAL_COMMUNITY)

## 2024-04-04 ENCOUNTER — Encounter (HOSPITAL_COMMUNITY): Payer: Self-pay | Admitting: Cardiology

## 2024-04-04 DIAGNOSIS — T82855A Stenosis of coronary artery stent, initial encounter: Secondary | ICD-10-CM | POA: Diagnosis not present

## 2024-04-04 DIAGNOSIS — Z87891 Personal history of nicotine dependence: Secondary | ICD-10-CM

## 2024-04-04 DIAGNOSIS — I219 Acute myocardial infarction, unspecified: Secondary | ICD-10-CM | POA: Diagnosis not present

## 2024-04-04 DIAGNOSIS — I25110 Atherosclerotic heart disease of native coronary artery with unstable angina pectoris: Secondary | ICD-10-CM | POA: Diagnosis not present

## 2024-04-04 DIAGNOSIS — N1832 Chronic kidney disease, stage 3b: Secondary | ICD-10-CM

## 2024-04-04 DIAGNOSIS — I2510 Atherosclerotic heart disease of native coronary artery without angina pectoris: Secondary | ICD-10-CM

## 2024-04-04 DIAGNOSIS — R079 Chest pain, unspecified: Secondary | ICD-10-CM | POA: Diagnosis present

## 2024-04-04 DIAGNOSIS — E1159 Type 2 diabetes mellitus with other circulatory complications: Secondary | ICD-10-CM | POA: Diagnosis not present

## 2024-04-04 DIAGNOSIS — I25118 Atherosclerotic heart disease of native coronary artery with other forms of angina pectoris: Secondary | ICD-10-CM

## 2024-04-04 DIAGNOSIS — E1122 Type 2 diabetes mellitus with diabetic chronic kidney disease: Secondary | ICD-10-CM

## 2024-04-04 DIAGNOSIS — Z01810 Encounter for preprocedural cardiovascular examination: Secondary | ICD-10-CM | POA: Diagnosis not present

## 2024-04-04 LAB — BASIC METABOLIC PANEL WITH GFR
Anion gap: 7 (ref 5–15)
BUN: 38 mg/dL — ABNORMAL HIGH (ref 8–23)
CO2: 20 mmol/L — ABNORMAL LOW (ref 22–32)
Calcium: 8.8 mg/dL — ABNORMAL LOW (ref 8.9–10.3)
Chloride: 108 mmol/L (ref 98–111)
Creatinine, Ser: 2.62 mg/dL — ABNORMAL HIGH (ref 0.61–1.24)
GFR, Estimated: 27 mL/min — ABNORMAL LOW (ref >60)
Glucose, Bld: 176 mg/dL — ABNORMAL HIGH (ref 70–99)
Potassium: 4.5 mmol/L (ref 3.5–5.1)
Sodium: 135 mmol/L (ref 135–145)

## 2024-04-04 LAB — ECHOCARDIOGRAM COMPLETE
Area-P 1/2: 3.16 cm2
Height: 66 in
S' Lateral: 2.7 cm
Weight: 3808 [oz_av]

## 2024-04-04 LAB — CBC
HCT: 37.3 % — ABNORMAL LOW (ref 39.0–52.0)
Hemoglobin: 12.4 g/dL — ABNORMAL LOW (ref 13.0–17.0)
MCH: 29 pg (ref 26.0–34.0)
MCHC: 33.2 g/dL (ref 30.0–36.0)
MCV: 87.1 fL (ref 80.0–100.0)
Platelets: 210 K/uL (ref 150–400)
RBC: 4.28 MIL/uL (ref 4.22–5.81)
RDW: 13.3 % (ref 11.5–15.5)
WBC: 7.5 K/uL (ref 4.0–10.5)
nRBC: 0 % (ref 0.0–0.2)

## 2024-04-04 LAB — LIPID PANEL
Cholesterol: 56 mg/dL (ref 0–200)
HDL: 24 mg/dL — ABNORMAL LOW (ref >40)
LDL Cholesterol: 4 mg/dL (ref 0–99)
Total CHOL/HDL Ratio: 2.3 ratio
Triglycerides: 139 mg/dL (ref <150)
VLDL: 28 mg/dL (ref 0–40)

## 2024-04-04 LAB — VAS US DOPPLER PRE CABG
Left ABI: 1.3
Right ABI: 1.25

## 2024-04-04 LAB — GLUCOSE, CAPILLARY
Glucose-Capillary: 177 mg/dL — ABNORMAL HIGH (ref 70–99)
Glucose-Capillary: 203 mg/dL — ABNORMAL HIGH (ref 70–99)
Glucose-Capillary: 142 mg/dL — ABNORMAL HIGH (ref 70–99)

## 2024-04-04 MED ORDER — PERFLUTREN LIPID MICROSPHERE
1.0000 mL | INTRAVENOUS | Status: AC | PRN
  Administered 2024-04-04: 2 mL via INTRAVENOUS

## 2024-04-04 MED FILL — Verapamil HCl IV Soln 2.5 MG/ML: INTRAVENOUS | Qty: 2 | Status: AC

## 2024-04-04 NOTE — Discharge Summary (Signed)
 Discharge Summary   Patient ID: Devon Wood MRN: [id number]; DOB: [birth year]  Admit date: 04/03/2024 Discharge date: 04/04/2024  PCP:  Ransom Other, MD   [facility] Providers Cardiologist:  Jerel Balding, MD      Discharge Diagnoses  Principal Problem:   CAD (coronary artery disease) Active Problems:   Mixed hyperlipidemia   DM type 2 causing complication [facility])   Essential hypertension   CKD (chronic kidney disease), stage III (HCC)   Diagnostic Studies/Procedures   Coronary angiography 04/03/2024: LM: No significant disease LAD: Ostial 80% de novo stenosis           Prox-mid LAD stent with 80% restenosis           High diag with prox 80% de novo stenosis, followed by stent with focal mid stent 95% restenosis            (Stented on 09/06/2023 for STEMI, previously called ramus intermedius)           Mid to distal LAD skip lesions of 60-80%, Distal apical LAD small caliber with diffuse 60% disease Lcx: Mild diffuse disease         Small OM1 with prox 75% disease         Medium OM2 with diffuse 70% disease RCA: Mild diffuse disease           Distal focal 75% stenosis           Ostial RPDA with 90% stenosis distal 80% diffuse disease   LVEDP 9 mmHg      Conclusion: Severe multivessel disease Progression of denovo disease as well as restenosis since 09/06/2023   Surgical revascularization potentially could achieve complete revascularization.  Concerns include patient preference against surgical revascularization, risk of perioperative complications including renal failure, and lack of optimal targets.  Alternatively, risk of percutaneous revascularization include not being able to achieve complete revascularization, risk of contrast induced nephropathy, as well as anatomical challenges with restenosis, and risk of stent failure with future restenosis.   I will admit him overnight with hydration and obtain surgical consult.  Percutaneous revascularization would  remain the second best option.  Third option would be medical therapy alone, but he will likely continue to have worsening symptoms and increased risk of MACE, in the future.   Manish JINNY Lawrence, MD _____________   History of Present Illness   Devon Wood is a 63 y.o. male with past medical history of CAD, pretension, diabetes, hyperlipidemia.  He underwent cardiac catheterization back in 09/2023 with stenting of the LAD with recommendations to treat residual disease medically.  On Brilinta  and aspirin  as well as Zetia , rosuvastatin  and metoprolol .  Presented to the office on 7/25 with complaints of chest pain for the past 2 weeks that occurred with rest and exertion.  Given his symptoms he was set up for outpatient cardiac catheterization.   Hospital Course   Consultants: TCTS   CAD Unstable angina -- Underwent cardiac catheterization 7/30 with ostial 80% de novo stenosis, proximal/mid LAD stent with 80% in-stent restenosis, high diagonal with 80% Denovo stenosis followed by stent with mid stent 95% in-stent restenosis, small OM1 75%, OM2 70%, distal RCA 75%.  Recommendations for CVTS consult.  He was seen by Dr. Shyrl with recommendations to proceed with CABG after Brilinta  washout.  Given he was an outpatient coming in with stable symptoms, discussed with MD and he will be discharged home to come back next week for his surgery. -- Pre-CABG Dopplers and echo  were completed prior to discharge -- continue ASA, Toprol , Crestor , Zetia    HTN -- controlled -- continue Toprol    HLD -- on Crestor  20mg  daily, Zetia  10mg  daily   DM -- Hgb A1c 9.2 -- metformin , jardiance  and januvia PTA   CKD stage 3b -- baseline Cr 2.1-2.3, 2.6 -- holding lisinopril  for now    Mild aortic dilation -- CT 12/2023 with ascending thoracic aorta 4.0cm  Patient deemed stable for discharge home with outpatient follow-up to come back next week for CABG.  CT surgery team will reach out to patient regarding  further instructions.  He will continue to hold his Brilinta  for now.  Strict ER precautions given.  Did the patient have an acute coronary syndrome (MI, NSTEMI, STEMI, etc) this admission?:  No                               Did the patient have a percutaneous coronary intervention (stent / angioplasty)?:  No.    _____________  Discharge Vitals Blood pressure 131/75, pulse 77, temperature 99.1 F (37.3 C), temperature source Oral, resp. rate 17, height 5' 6 (1.676 m), weight 108 kg, SpO2 99%.  Filed Weights   04/03/24 1132  Weight: 108 kg    Labs & Radiologic Studies  CBC Recent Labs    04/03/24 1859 04/04/24 0858  WBC 7.6 7.5  HGB 12.0* 12.4*  HCT 36.7* 37.3*  MCV 86.8 87.1  PLT 205 210   Basic Metabolic Panel Recent Labs    92/69/74 1853 04/04/24 0858  NA 135 135  K 4.4 4.5  CL 104 108  CO2 22 20*  GLUCOSE 136* 176*  BUN 41* 38*  CREATININE 2.46* 2.62*  CALCIUM  8.9 8.8*   Liver Function Tests No results for input(s): AST, ALT, ALKPHOS, BILITOT, PROT, ALBUMIN  in the last 72 hours. No results for input(s): LIPASE, AMYLASE in the last 72 hours. High Sensitivity Troponin:   No results for input(s): TROPONINIHS in the last 720 hours.  No results for input(s): TRNPT in the last 720 hours.  BNP Invalid input(s): POCBNP No results for input(s): PROBNP in the last 72 hours.  No results for input(s): BNP in the last 72 hours.  D-Dimer No results for input(s): DDIMER in the last 72 hours. Hemoglobin A1C Recent Labs    04/03/24 1853  HGBA1C 9.2*   Fasting Lipid Panel Recent Labs    04/04/24 0858  CHOL 56  HDL 24*  LDLCALC 4  TRIG 139  CHOLHDL 2.3   Lipoprotein (a)  Date/Time Value Ref Range Status  09/07/2023 04:22 AM 8.7 <75.0 nmol/L Final    Comment:    (NOTE) Note:  Values greater than or equal to 75.0 nmol/L may       indicate an independent risk factor for CHD,       but must be evaluated with caution when applied        to non-Caucasian populations due to the       influence of genetic factors on Lp(a) across       ethnicities. Performed At: [facility] [address] [place], [place] [id number] Jennette Shorter MD Ey:[id number]     Thyroid Function Tests No results for input(s): TSH, T4TOTAL, T3FREE, THYROIDAB in the last 72 hours.  Invalid input(s): FREET3 _____________  VAS US  DOPPLER PRE CABG Result Date: 04/04/2024 PREOPERATIVE VASCULAR EVALUATION Patient Name:  Devon Wood  Date of Exam:  04/04/2024 Medical Rec #: [id number]      Accession #:    [id number] Date of Birth: 10/07/1960      Patient Gender: M Patient Age:   63 years Exam Location:  [facility] Procedure:      VAS US  DOPPLER PRE CABG Referring Phys: HARRELL LIGHTFOOT --------------------------------------------------------------------------------  Indications: Pre-CABG. Performing Technologist: Jimmye Scarce RVT  Examination Guidelines: A complete evaluation includes B-mode imaging, spectral Doppler, color Doppler, and power Doppler as needed of all accessible portions of each vessel. Bilateral testing is considered an integral part of a complete examination. Limited examinations for reoccurring indications may be performed as noted.  Right Carotid Findings: +----------+--------+--------+--------+--------------------------+--------+           PSV cm/sEDV cm/sStenosisDescribe                  Comments +----------+--------+--------+--------+--------------------------+--------+ CCA Prox  99      15                                                 +----------+--------+--------+--------+--------------------------+--------+ CCA Distal75      15                                                 +----------+--------+--------+--------+--------------------------+--------+ ICA Prox  76      27      1-39%   irregular and heterogenous          +----------+--------+--------+--------+--------------------------+--------+ ICA Mid   81      33                                                 +----------+--------+--------+--------+--------------------------+--------+ ICA Distal74      29                                                 +----------+--------+--------+--------+--------------------------+--------+ ECA       112                                                        +----------+--------+--------+--------+--------------------------+--------+ +----------+--------+-------+----------------+------------+           PSV cm/sEDV cmsDescribe        Arm Pressure +----------+--------+-------+----------------+------------+ [id number]            Multiphasic, [id number]          +----------+--------+-------+----------------+------------+ +---------+--------+--+--------+--+---------+ VertebralPSV cm/s35EDV cm/s10Antegrade +---------+--------+--+--------+--+---------+ Left Carotid Findings: +----------+--------+-------+--------+----------------------+------------------+           PSV cm/sEDV    StenosisDescribe              Comments                             cm/s                                                    +----------+--------+-------+--------+----------------------+------------------+  CCA Prox  101     25                                                      +----------+--------+-------+--------+----------------------+------------------+ CCA Distal78      26                                   intimal thickening +----------+--------+-------+--------+----------------------+------------------+ ICA Prox  75      34     1-39%   smooth and                                                                heterogenous                             +----------+--------+-------+--------+----------------------+------------------+ ICA Mid   70      31                                                       +----------+--------+-------+--------+----------------------+------------------+ ICA Distal92      36                                                      +----------+--------+-------+--------+----------------------+------------------+ ECA       76                                                              +----------+--------+-------+--------+----------------------+------------------+ +----------+--------+--------+----------------+------------+ SubclavianPSV cm/sEDV cm/sDescribe        Arm Pressure +----------+--------+--------+----------------+------------+           107             Multiphasic, WNL             +----------+--------+--------+----------------+------------+ +---------+--------+--+--------+--+---------+ VertebralPSV cm/s48EDV cm/s19Antegrade +---------+--------+--+--------+--+---------+  ABI Findings: +------------------+-----+---------+ Rt Pressure (mmHg)IndexWaveform  +------------------+-----+---------+ 126                    biphasic  +------------------+-----+---------+ 144               1.14 biphasic  +------------------+-----+---------+ 154               1.22 triphasic +------------------+-----+---------+ +------------------+-----+---------+-------+ Lt Pressure (mmHg)IndexWaveform Comment +------------------+-----+---------+-------+                        triphasicIV      +------------------+-----+---------+-------+ 133               1.06 biphasic         +------------------+-----+---------+-------+  164               1.30 biphasic         +------------------+-----+---------+-------+ +-------+---------------+ ABI/TBIToday's ABI/TBI +-------+---------------+ Right  1.25            +-------+---------------+ Left   1.30            +-------+---------------+  Right Doppler Findings: +--------+--------+--------+ Site    PressureDoppler  +--------+--------+--------+ [id number]     biphasic  +--------+--------+--------+  Left Doppler Findings: +--------+---------+--------+ Site    Doppler  Comments +--------+---------+--------+ BrachialtriphasicIV       +--------+---------+--------+   Summary: Right Carotid: Velocities in the right ICA are consistent with a 1-39% stenosis. Left Carotid: Velocities in the left ICA are consistent with a 1-39% stenosis. Vertebrals:  Bilateral vertebral arteries demonstrate antegrade flow. Subclavians: Normal flow hemodynamics were seen in bilateral subclavian              arteries. Right ABI: Resting right ankle-brachial index is within normal range. Left ABI: Resting left ankle-brachial index is within normal range. Right Upper Extremity: Doppler waveforms decrease >50% with right radial compression. Doppler waveforms remain within normal limits with right ulnar compression. Left Upper Extremity: Doppler waveforms remain within normal limits with left radial compression. Doppler waveform obliterate with left ulnar compression.  Electronically signed by Lonni Gaskins MD on 04/04/2024 at 3:52:57 PM.    Final    CARDIAC CATHETERIZATION Result Date: 04/03/2024 Images from the original result were not included. Coronary angiography 04/03/2024: LM: No significant disease LAD: Ostial 80% de novo stenosis           Prox-mid LAD stent with 80% restenosis           High diag with prox 80% de novo stenosis, followed by stent with focal mid stent 95% restenosis           (Stented on 09/06/2023 for STEMI, previously called ramus intermedius)           Mid to distal LAD skip lesions of 60-80%, Distal apical LAD small caliber with diffuse 60% disease Lcx: Mild diffuse disease         Small OM1 with prox 75% disease         Medium OM2 with diffuse 70% disease RCA: Mild diffuse disease           Distal focal 75% stenosis           Ostial RPDA with 90% stenosis distal 80% diffuse disease LVEDP 9 mmHg Conclusion: Severe multivessel disease Progression of denovo disease as well as  restenosis since 09/06/2023 Surgical revascularization potentially could achieve complete revascularization.  Concerns include patient preference against surgical revascularization, risk of perioperative complications including renal failure, and lack of optimal targets.  Alternatively, risk of percutaneous revascularization include not being able to achieve complete revascularization, risk of contrast induced nephropathy, as well as anatomical challenges with restenosis, and risk of stent failure with future restenosis. I will admit him overnight with hydration and obtain surgical consult.  Percutaneous revascularization would remain the second best option.  Third option would be medical therapy alone, but he will likely continue to have worsening symptoms and increased risk of MACE, in the future. Manish JINNY Lawrence, MD   Disposition Pt is being discharged home today in good condition.  Follow-up Plans & Appointments  Discharge Instructions     Diet - low sodium heart healthy   Complete by: As directed    Increase activity slowly   Complete by:  As directed        Discharge Medications Allergies as of 04/04/2024       Reactions   Erythromycin Rash        Medication List     STOP taking these medications    hydrochlorothiazide  12.5 MG capsule Commonly known as: MICROZIDE        TAKE these medications    aspirin  81 MG tablet Take 81 mg by mouth daily.   co-enzyme Q-10 30 MG capsule Take 30 mg by mouth 3 (three) times daily.   empagliflozin  10 MG Tabs tablet Commonly known as: JARDIANCE  Take 10 mg by mouth daily.   escitalopram  10 MG tablet Commonly known as: LEXAPRO  Take 10 mg by mouth daily.   ezetimibe  10 MG tablet Commonly known as: ZETIA  Take 1 tablet (10 mg total) by mouth daily.   famotidine  20 MG tablet Commonly known as: PEPCID  Take 20 mg by mouth daily.   fluticasone  50 MCG/ACT nasal spray Commonly known as: FLONASE  Place 2 sprays into both  nostrils daily as needed for allergies.   meloxicam  15 MG tablet Commonly known as: MOBIC  Take 15 mg by mouth daily.   metFORMIN  1000 MG tablet Commonly known as: GLUCOPHAGE  Take 1,000 mg by mouth 2 (two) times daily with a meal.   metoprolol  succinate 50 MG 24 hr tablet Commonly known as: TOPROL -XL Take 1 tablet (50 mg total) by mouth daily.   nitroGLYCERIN  0.4 MG SL tablet Commonly known as: NITROSTAT  Place 1 tablet (0.4 mg total) under the tongue every 5 (five) minutes as needed.   rosuvastatin  20 MG tablet Commonly known as: CRESTOR  Take 1 tablet (20 mg total) by mouth daily.   sitaGLIPtin 100 MG tablet Commonly known as: JANUVIA Take 50 mg by mouth daily.         Outstanding Labs/Studies  N/a  Duration of Discharge Encounter: APP Time: 20 minutes   Signed, Manuelita Rummer, NP 04/04/2024, 5:14 PM

## 2024-04-04 NOTE — Progress Notes (Signed)
 Echocardiogram 2D Echocardiogram has been performed.  Devon Wood RDCS 04/04/2024, 4:00 PM

## 2024-04-04 NOTE — Plan of Care (Signed)
  Problem: Activity: Goal: Ability to return to baseline activity level will improve Outcome: Progressing   Problem: Cardiovascular: Goal: Ability to achieve and maintain adequate cardiovascular perfusion will improve Outcome: Progressing Goal: Vascular access site(s) Level 0-1 will be maintained Outcome: Progressing   Problem: Activity: Goal: Risk for activity intolerance will decrease Outcome: Progressing   Problem: Pain Managment: Goal: General experience of comfort will improve and/or be controlled Outcome: Progressing   Problem: Safety: Goal: Ability to remain free from injury will improve Outcome: Progressing   Problem: Cardiovascular: Goal: Ability to achieve and maintain adequate cardiovascular perfusion will improve Outcome: Progressing Goal: Vascular access site(s) Level 0-1 will be maintained Outcome: Progressing   Problem: Clinical Measurements: Goal: Respiratory complications will improve Outcome: Progressing Goal: Cardiovascular complication will be avoided Outcome: Progressing   Problem: Activity: Goal: Risk for activity intolerance will decrease Outcome: Progressing   Problem: Coping: Goal: Level of anxiety will decrease Outcome: Progressing   Problem: Pain Managment: Goal: General experience of comfort will improve and/or be controlled Outcome: Progressing   Problem: Safety: Goal: Ability to remain free from injury will improve Outcome: Progressing

## 2024-04-04 NOTE — Plan of Care (Signed)
 Problem: Education: Goal: Understanding of CV disease, CV risk reduction, and recovery process will improve Outcome: Progressing Goal: Individualized Educational Video(s) Outcome: Progressing   Problem: Activity: Goal: Ability to return to baseline activity level will improve Outcome: Progressing   Problem: Cardiovascular: Goal: Ability to achieve and maintain adequate cardiovascular perfusion will improve Outcome: Progressing Goal: Vascular access site(s) Level 0-1 will be maintained Outcome: Progressing   Problem: Health Behavior/Discharge Planning: Goal: Ability to safely manage health-related needs after discharge will improve Outcome: Progressing   Problem: Education: Goal: Knowledge of General Education information will improve Description: Including pain rating scale, medication(s)/side effects and non-pharmacologic comfort measures Outcome: Progressing   Problem: Health Behavior/Discharge Planning: Goal: Ability to manage health-related needs will improve Outcome: Progressing   Problem: Clinical Measurements: Goal: Ability to maintain clinical measurements within normal limits will improve Outcome: Progressing Goal: Will remain free from infection Outcome: Progressing Goal: Diagnostic test results will improve Outcome: Progressing Goal: Respiratory complications will improve Outcome: Progressing Goal: Cardiovascular complication will be avoided Outcome: Progressing   Problem: Activity: Goal: Risk for activity intolerance will decrease Outcome: Progressing   Problem: Nutrition: Goal: Adequate nutrition will be maintained Outcome: Progressing   Problem: Coping: Goal: Level of anxiety will decrease Outcome: Progressing   Problem: Elimination: Goal: Will not experience complications related to bowel motility Outcome: Progressing Goal: Will not experience complications related to urinary retention Outcome: Progressing   Problem: Pain Managment: Goal:  General experience of comfort will improve and/or be controlled Outcome: Progressing   Problem: Safety: Goal: Ability to remain free from injury will improve Outcome: Progressing   Problem: Skin Integrity: Goal: Risk for impaired skin integrity will decrease Outcome: Progressing   Problem: Education: Goal: Ability to describe self-care measures that may prevent or decrease complications (Diabetes Survival Skills Education) will improve Outcome: Progressing Goal: Individualized Educational Video(s) Outcome: Progressing   Problem: Coping: Goal: Ability to adjust to condition or change in health will improve Outcome: Progressing   Problem: Education: Goal: Understanding of CV disease, CV risk reduction, and recovery process will improve Outcome: Progressing Goal: Individualized Educational Video(s) Outcome: Progressing   Problem: Activity: Goal: Ability to return to baseline activity level will improve Outcome: Progressing   Problem: Cardiovascular: Goal: Ability to achieve and maintain adequate cardiovascular perfusion will improve Outcome: Progressing Goal: Vascular access site(s) Level 0-1 will be maintained Outcome: Progressing   Problem: Health Behavior/Discharge Planning: Goal: Ability to safely manage health-related needs after discharge will improve Outcome: Progressing   Problem: Education: Goal: Knowledge of General Education information will improve Description: Including pain rating scale, medication(s)/side effects and non-pharmacologic comfort measures Outcome: Progressing   Problem: Health Behavior/Discharge Planning: Goal: Ability to manage health-related needs will improve Outcome: Progressing   Problem: Clinical Measurements: Goal: Ability to maintain clinical measurements within normal limits will improve Outcome: Progressing Goal: Will remain free from infection Outcome: Progressing Goal: Diagnostic test results will improve Outcome:  Progressing Goal: Respiratory complications will improve Outcome: Progressing Goal: Cardiovascular complication will be avoided Outcome: Progressing   Problem: Activity: Goal: Risk for activity intolerance will decrease Outcome: Progressing   Problem: Nutrition: Goal: Adequate nutrition will be maintained Outcome: Progressing   Problem: Coping: Goal: Level of anxiety will decrease Outcome: Progressing   Problem: Elimination: Goal: Will not experience complications related to bowel motility Outcome: Progressing Goal: Will not experience complications related to urinary retention Outcome: Progressing   Problem: Pain Managment: Goal: General experience of comfort will improve and/or be controlled Outcome: Progressing   Problem: Safety: Goal: Ability  to remain free from injury will improve Outcome: Progressing   Problem: Skin Integrity: Goal: Risk for impaired skin integrity will decrease Outcome: Progressing

## 2024-04-04 NOTE — Consult Note (Addendum)
 [address]       [place] [address]             [id number]        [facility] Medical Record #[id number] Date of Birth: Jan 23, 1961  Referring: Elmira Devon PARAS, MD  Primary Care: Ransom Other, MD Primary Cardiologist: Jerel Balding, MD  Reason for Consult:  Evaluation for coronary bypass grafting for multivessel coronary artery disease presenting with Stable angina pectoris.    History of Present Illness:     [name] is a 63 year old gentleman with a past history notable for hypertension, dyslipidemia, type 2 diabetes mellitus (hemoglobin A1c 9.5), CKD stage IIIb, spinal stenosis with cardiomyopathy, anxiety with depression, and who is a former 4-pack-year cigarette smoker.  He has class II obesity with a BMI of 38.  Devon Wood has known coronary artery disease having undergone PCI to the LAD in 2004.  In January of this year, presented with an acute ST elevation myocardial infarction and subsequently underwent left heart catheterization with urgent PCI with a drug-eluting stent to the the firs diagonal coronary artery (previously labeled ramus intermediate coronary artery on the cath report).  At the time, left heart catheterization demonstrated diffuse multivessel coronary artery disease not felt to be amenable to further PCI.  He and his cardiologist decided to continue with medical management rather pursue referral for CABG due to potential for perioperative worsening of his stage III CKD.  He has been maintained on dual antiplatelet therapy with aspirin  81 mg and Brilinta  along with rosuvastatin , Zetia , lisinopril , and metoprolol .  He was seen in follow-up by Dr. Arnetha 03/29/2024 with complaints of chest pain over the course of the previous 2 weeks.  The pain was not relieved with Pepto-Bismol or his GERD medications but was relieved with nitroglycerin .  He also reported hypertension during these episodes with his blood pressure  as high as 202/102.  Evaluation with left heart catheterization w and in-stent stenoses as recommended.  He had elective heart catheterization yesterday showing high-grade in-stent stenoses in both the diagonal and LAD. In addition, there is diffuse mid and distal LAD disease with 3 lesions ranging from 60 to 80%.  The 1st and 2nd obtuse marginals have 75 and 80% stenoses respectively.  The posterior descending coronary artery is diffusely diseased with 70 to 90% tandem lesions.  The most recent echocardiogram was done in April of this year showing 60 to 65% left ventricular ejection fraction.  RV function was normal.  There were no significant valvular lesions present the ascending aorta was estimated at 43 mm in diameter by echo.  Follow-up CTA chest showed the ascending aorta to measure 4.0 cm which was stable compared with CTA in December 2022.  There was no evidence of dissection. Devon Wood is referred for consideration of coronary bypass grafting in the setting of multivessel coronary artery disease with high-grade in-stent stenoses of the LAD and diagonal with progressive angina.  Devon Wood is sitting in the bedside chair and denies having any chest pain in the past 2 days.  He is married and lives with his wife who is in poor health.  He has 2 sons who live locally who would be willing to assist him with postoperative rehab.  Devon Wood has retired on disability after a fall at work in 2022 resulting in multiple rib fractures.  He also has chronic back and neck pain but is able to move about  unassisted.  He continues to do his own yard work.  He is right-handed.  He has not seen a dentist in several years but has no current dental issues with concerned about.   Last dose of Brilinta  was 04/02/2024.    Current Activity/ Functional Status: Patient is independent with mobility/ambulation, transfers, ADL's, IADL's.   Zubrod Score: At the time of surgery this patient's most appropriate activity status/level  should be described as: []     0    Normal activity, no symptoms []     1    Restricted in physical strenuous activity but ambulatory, able to do out light work [x]     2    Ambulatory and capable of self care, unable to do work activities, up and about                 more than 50%  Of the time                            []     3    Only limited self care, in bed greater than 50% of waking hours []     4    Completely disabled, no self care, confined to bed or chair []     5    Moribund  Past Medical History:  Diagnosis Date   Anxiety 08/02/2012   Arthritis 04/12/2012   Bronchitis    hx of;last time couple of yrs ago   Bronchitis    hx of   CAD (coronary artery disease) 10/10/2013   2004 2.0 x 25 mm drug-eluting cypher stent to LAD    Chronic back pain    L3 buldging disc;DDD   CKD (chronic kidney disease), stage III (HCC) 09/07/2023   Complication of anesthesia    20-30 second asystole episode during induction  04/2003,  was on b-blocker tx at the time, found to have 80% LAD lesion s/p stent (but unclear if CAD was the direct cause)    Coronary artery disease 2004   stent placement, sees Dr [facility]   Depression 2006   Diabetes mellitus 2005   takes Glipizide  and Metformin  daily   DM type 2 causing complication (HCC) 10/10/2013   GERD (gastroesophageal reflux disease) 04/12/2012   takes Pepcid  daily   Gout 02/21/2007   hx of   History of kidney stones    HTN (hypertension) 2005   Hypercholesteremia 04/13/2012   takes Simvastatin  daily   Hypertension    takes Lisinopril  daily   Joint pain    Mixed hyperlipidemia 10/10/2013   Morbid obesity (HCC) 10/10/2013   Nocturia    Peptic ulcer     Past Surgical History:  Procedure Laterality Date   ANTERIOR CERVICAL DECOMP/DISCECTOMY FUSION N/A 07/28/2021   Procedure: Anterior Cervical Decompression Fusion Cervical four-five,Cervical five-six;  Surgeon: Onetha Kuba, MD;  Location: [facility] OR;  Service: Neurosurgery;   Laterality: N/A;   BACK SURGERY     CARDIAC CATHETERIZATION  11/27/2009   widely patent stent   COLONOSCOPY WITH PROPOFOL  N/A 03/17/2015   Procedure: COLONOSCOPY WITH PROPOFOL ;  Surgeon: Gladis MARLA Louder, MD;  Location: [facility];  Service: Endoscopy;  Laterality: N/A;   CORONARY STENT PLACEMENT  05/30/2003   proximal LAD   CORONARY/GRAFT ACUTE MI REVASCULARIZATION N/A 09/06/2023   Procedure: Coronary/Graft Acute MI Revascularization;  Surgeon: Elmira Devon PARAS, MD;  Location: [facility];  Service: Cardiovascular;  Laterality: N/A;   ESOPHAGOGASTRODUODENOSCOPY  in the 90's   HAND SURGERY  1980   right   LEFT HEART CATH AND CORONARY ANGIOGRAPHY N/A 09/06/2023   Procedure: LEFT HEART CATH AND CORONARY ANGIOGRAPHY;  Surgeon: Elmira Devon PARAS, MD;  Location: [facility];  Service: Cardiovascular;  Laterality: N/A;   LEFT HEART CATH AND CORONARY ANGIOGRAPHY N/A 04/03/2024   Procedure: LEFT HEART CATH AND CORONARY ANGIOGRAPHY;  Surgeon: Elmira Devon PARAS, MD;  Location: [facility];  Service: Cardiovascular;  Laterality: N/A;   LUMBAR DISC SURGERY  2002   L5   LUMBAR FUSION  2004   NM MYOCAR PERF WALL MOTION  05/19/2004   no evidence of ischemia   POSTERIOR CERVICAL FUSION/FORAMINOTOMY N/A 08/11/2021   Procedure: Post Cervical Lami/Multi Level - Cervical four-Cervical five - Cervical five-Cervical six with lateral mass screw fixation;  Surgeon: Onetha Kuba, MD;  Location: [facility] OR;  Service: Neurosurgery;  Laterality: N/A;   TONSILLECTOMY  1969   US  ECHOCARDIOGRAPHY  04/30/2003   mildly dilated aortic root,mild LA enlargement,mild concentric LVH,trivial mitral and tricuspid regurg.,mild mitral annular ca+    Social History   Tobacco Use  Smoking Status Former   Current packs/day: 0.00   Average packs/day: 0.5 packs/day for 4.0 years (2.0 ttl pk-yrs)   Types: Cigarettes   Start date: 54   Quit date: 52   Years since quitting: 41.6  Smokeless Tobacco Former    Types: Chew    Social History   Substance and Sexual Activity  Alcohol Use Yes   Comment: occasionally     Allergies  Allergen Reactions   Erythromycin Rash    Current Facility-Administered Medications  Medication Dose Route Frequency Provider Last Rate Last Admin   0.9 %  sodium chloride  infusion   Intravenous Continuous Patwardhan, Manish J, MD 75 mL/hr at 04/03/24 2025 New Bag at 04/03/24 2025   0.9 %  sodium chloride  infusion  250 mL Intravenous PRN Patwardhan, Manish J, MD       acetaminophen  (TYLENOL ) tablet 650 mg  650 mg Oral Q4H PRN Patwardhan, Manish J, MD       aspirin  EC tablet 81 mg  81 mg Oral Daily Patwardhan, Manish J, MD   81 mg at 04/04/24 0842   escitalopram  (LEXAPRO ) tablet 10 mg  10 mg Oral Daily Patwardhan, Manish J, MD   10 mg at 04/04/24 0841   ezetimibe  (ZETIA ) tablet 10 mg  10 mg Oral Daily Patwardhan, Manish J, MD   10 mg at 04/04/24 0842   famotidine  (PEPCID ) tablet 20 mg  20 mg Oral Daily Patwardhan, Manish J, MD   20 mg at 04/04/24 0842   fluticasone  (FLONASE ) 50 MCG/ACT nasal spray 2 spray  2 spray Each Nare Daily PRN Patwardhan, Manish J, MD       heparin  injection 5,000 Units  5,000 Units Subcutaneous Q8H Patwardhan, Manish J, MD   5,000 Units at 04/04/24 0453   insulin  aspart (novoLOG ) injection 0-5 Units  0-5 Units Subcutaneous QHS Patwardhan, Manish J, MD   2 Units at 04/03/24 2147   insulin  aspart (novoLOG ) injection 0-9 Units  0-9 Units Subcutaneous TID WC Patwardhan, Manish J, MD   2 Units at 04/04/24 0841   insulin  aspart (novoLOG ) injection 3 Units  3 Units Subcutaneous TID WC Patwardhan, Manish J, MD   3 Units at 04/04/24 0842   metoprolol  succinate (TOPROL -XL) 24 hr tablet 50 mg  50 mg Oral Daily Patwardhan, Manish J, MD   50 mg at 04/04/24 [phone]  nitroGLYCERIN  (NITROSTAT ) SL tablet 0.4 mg  0.4 mg Sublingual Q5 min PRN Patwardhan, Manish J, MD       ondansetron  (ZOFRAN ) injection 4 mg  4 mg Intravenous Q6H PRN Patwardhan, Manish J, MD        rosuvastatin  (CRESTOR ) tablet 20 mg  20 mg Oral Daily Patwardhan, Manish J, MD   20 mg at 04/04/24 0842   sodium chloride  flush (NS) 0.9 % injection 3 mL  3 mL Intravenous Q12H Patwardhan, Manish J, MD   3 mL at 04/04/24 9157   sodium chloride  flush (NS) 0.9 % injection 3 mL  3 mL Intravenous PRN Patwardhan, Manish J, MD        Medications Prior to Admission  Medication Sig Dispense Refill Last Dose/Taking   aspirin  81 MG tablet Take 81 mg by mouth daily.   04/03/2024 at  9:00 AM   co-enzyme Q-10 30 MG capsule Take 30 mg by mouth 3 (three) times daily.   04/03/2024   empagliflozin  (JARDIANCE ) 10 MG TABS tablet Take 10 mg by mouth daily.   04/02/2024   escitalopram  (LEXAPRO ) 10 MG tablet Take 10 mg by mouth daily.   04/03/2024 at  9:00 AM   ezetimibe  (ZETIA ) 10 MG tablet Take 1 tablet (10 mg total) by mouth daily. 90 tablet 3 04/03/2024 at  9:00 AM   famotidine  (PEPCID ) 20 MG tablet Take 20 mg by mouth daily.    04/03/2024 at  9:00 AM   hydrochlorothiazide  (MICROZIDE ) 12.5 MG capsule Take 12.5 mg by mouth daily.   04/02/2024   meloxicam  (MOBIC ) 15 MG tablet Take 15 mg by mouth daily.   04/02/2024   metFORMIN  (GLUCOPHAGE ) 1000 MG tablet Take 1,000 mg by mouth 2 (two) times daily with a meal.   04/02/2024   metoprolol  succinate (TOPROL -XL) 50 MG 24 hr tablet Take 1 tablet (50 mg total) by mouth daily. 90 tablet 3 04/03/2024 at  9:00 AM   nitroGLYCERIN  (NITROSTAT ) 0.4 MG SL tablet Place 1 tablet (0.4 mg total) under the tongue every 5 (five) minutes as needed. 25 tablet 11 Taking As Needed   rosuvastatin  (CRESTOR ) 20 MG tablet Take 1 tablet (20 mg total) by mouth daily. 90 tablet 3 04/03/2024 at  9:00 AM   sitaGLIPtin (JANUVIA) 100 MG tablet Take 50 mg by mouth daily.   04/02/2024   fluticasone  (FLONASE ) 50 MCG/ACT nasal spray Place 2 sprays into both nostrils daily as needed for allergies.   More than a month    Family History  Problem Relation Age of Onset   COPD Mother    Heart attack Father    COPD  Brother    COPD Brother    Dementia Maternal Grandmother    Cancer Maternal Grandfather    Heart failure Paternal Grandmother    Heart attack Paternal Grandfather    Hypertension Sister    Anesthesia problems Neg Hx    Hypotension Neg Hx    Malignant hyperthermia Neg Hx    Pseudochol deficiency Neg Hx      Review of Systems:     Cardiac Review of Systems: Y or  [    ]= no  Chest Pain [prior to admission, none in the past 48 hours]  Resting SOB [   ] Exertional SOB  [ x ]  Orthopnea [  ]   Pedal Edema [   ]    Palpitations [  ] Syncope  [  ]   Presyncope [   ]  General  Review of Systems: [Y] = yes [  ]=no Constitional: recent weight change [  ]; anorexia [  ]; fatigue [  ]; nausea [  ]; night sweats [  ]; fever [  ]; or chills [  ]                                                               Dental: Last Dentist visit: Several years, no current dental issues  Eye : blurred vision [  ]; diplopia [   ]; vision changes [  ];  Amaurosis fugax[  ]; Resp: cough [  ];  wheezing[  ];  hemoptysis[  ]; shortness of breath[ x ]; paroxysmal nocturnal dyspnea[  ]; dyspnea on exertion[x  ]; or orthopnea[  ];  GI:  gallstones[  ], vomiting[  ];  dysphagia[  ]; melena[  ];  hematochezia [  ]; heartburn[  ];   Hx of  Colonoscopy[  ]; GU: kidney stones [  ]; hematuria[  ];   dysuria [  ];  nocturia[  ];  history of     obstruction [  ]; urinary frequency [  ]             Skin: rash, swelling[  ];, hair loss[  ];  peripheral edema[  ];  or itching[  ]; Musculosketetal: myalgias[  ];  joint swelling[  ];  joint erythema[  ];  joint pain[  ];  back pain[ x ];  Heme/Lymph: bruising[  ];  bleeding[  ];  anemia[  ];  Neuro: TIA[  ];  headaches[  ];  stroke[  ];  vertigo[  ];  seizures[  ];   paresthesias[  ];  difficulty walking[  ];  Psych:depression[ x]; anxiety[ x];  Endocrine: diabetes[type II];  thyroid dysfunction[  ];               Physical Exam: BP 97/62 (BP Location: Left Arm)   Pulse 63    Temp 98.7 F (37.1 C) (Oral)   Resp 14   Ht 5' 6 (1.676 m)   Wt 108 kg   SpO2 99%   BMI 38.41 kg/m    General appearance: alert, cooperative, and no distress Head: Normocephalic, without obvious abnormality, atraumatic Neck: no adenopathy, no carotid bruit, no JVD, and supple, symmetrical, trachea midline Lymph nodes: No palpable cervical or clavicular adenopathy Resp: Breath sounds full, equal, and clear to auscultation. Cardio: Regular rate and rhythm without murmur or rub.  Monitor shows sinus rhythm with first-degree AV block and no other significant arrhythmias since admission. GI: Obese, soft and nontender Extremities: No obvious deformities.  All are well-perfused no peripheral edema.  He has superficial varicosities in both lower extremities Neurologic: Grossly normal  Diagnostic Studies & Laboratory data:  LEFT HEART CATH AND CORONARY ANGIOGRAPHY   Conclusion  Coronary angiography 04/03/2024: LM: No significant disease LAD: Ostial 80% de novo stenosis           Prox-mid LAD stent with 80% restenosis           High diag with prox 80% de novo stenosis, followed by stent with focal mid stent 95% restenosis            (Stented on 09/06/2023 for STEMI, previously called ramus  intermedius)           Mid to distal LAD skip lesions of 60-80%, Distal apical LAD small caliber with diffuse 60% disease Lcx: Mild diffuse disease         Small OM1 with prox 75% disease         Medium OM2 with diffuse 70% disease RCA: Mild diffuse disease           Distal focal 75% stenosis           Ostial RPDA with 90% stenosis distal 80% diffuse disease   LVEDP 9 mmHg      Conclusion: Severe multivessel disease Progression of denovo disease as well as restenosis since 09/06/2023   Surgical revascularization potentially could achieve complete revascularization.  Concerns include patient preference against surgical revascularization, risk of perioperative complications including renal failure,  and lack of optimal targets.  Alternatively, risk of percutaneous revascularization include not being able to achieve complete revascularization, risk of contrast induced nephropathy, as well as anatomical challenges with restenosis, and risk of stent failure with future restenosis.   I will admit him overnight with hydration and obtain surgical consult.  Percutaneous revascularization would remain the second best option.  Third option would be medical therapy alone, but he will likely continue to have worsening symptoms and increased risk of MACE, in the future.   Devon JINNY Lawrence, MD     ECHOCARDIOGRAM REPORT       Patient Name:   Devon Wood Date of Exam: 12/07/2023  Medical Rec #:  [id number]     Height:       67.0 in  Accession #:    [id number]    Weight:       237.0 lb  Date of Birth:  [birth year]     BSA:          2.174 m  Patient Age:    62 years      BP:           110/70 mmHg  Patient Gender: M             HR:           73 bpm.  Exam Location:  Outpatient   Procedure: 2D Echo, 3D Echo, Color Doppler, Cardiac Doppler and  Intracardiac            Opacification Agent (Both Spectral and Color Flow Doppler were             utilized during procedure).   Indications:    R60.0 Lower extremity edema    History:        Patient has prior history of Echocardiogram examinations,  most                 recent 09/07/2023. CAD and Previous Myocardial Infarction,                  Signs/Symptoms:Edema; Risk Factors:Hypertension, Diabetes,  CKD                 stage 3, Dyslipidemia and Former Smoker. Patient denies  chest                 pain and SOB. He does have mild leg edema.    Sonographer:    Annabella Cater RVT, RDCS (AE), RDMS  Referring Phys: [id number] MADISON L FOUNTAIN     Sonographer Comments: Suboptimal apical window, patient is obese and  suboptimal parasternal window. Image acquisition challenging due to  patient body habitus.  IMPRESSIONS     1. Left ventricular ejection  fraction, by estimation, is 60 to 65%. Left  ventricular ejection fraction by 3D volume is 64 %. The left ventricle has  normal function. The left ventricle has no regional wall motion  abnormalities. Left ventricular diastolic   parameters are indeterminate.   2. Right ventricular systolic function is normal. The right ventricular  size is normal. There is normal pulmonary artery systolic pressure. The  estimated right ventricular systolic pressure is 29.2 mmHg.   3. The mitral valve is normal in structure. Trivial mitral valve  regurgitation. No evidence of mitral stenosis.   4. The aortic valve is tricuspid. Aortic valve regurgitation is not  visualized. No aortic stenosis is present.   5. Aortic dilatation noted. There is dilatation of the aortic root,  measuring 43 mm. There is dilatation of the ascending aorta, measuring 39  mm.   6. The inferior vena cava is normal in size with greater than 50%  respiratory variability, suggesting right atrial pressure of 3 mmHg.   Comparison(s): EF 50%, hypokinesis anterolateral and mid-apical anterior  walls, AOR 44mm.   FINDINGS   Left Ventricle: Left ventricular ejection fraction, by estimation, is 60  to 65%. Left ventricular ejection fraction by 3D volume is 64 %. The left  ventricle has normal function. The left ventricle has no regional wall  motion abnormalities. Definity   contrast agent was given IV to delineate the left ventricular endocardial  borders. The left ventricular internal cavity size was normal in size.  There is no left ventricular hypertrophy. Left ventricular diastolic  parameters are indeterminate.   Right Ventricle: The right ventricular size is normal. No increase in  right ventricular wall thickness. Right ventricular systolic function is  normal. There is normal pulmonary artery systolic pressure. The tricuspid  regurgitant velocity is 2.56 m/s, and   with an assumed right atrial pressure of 3 mmHg, the  estimated right  ventricular systolic pressure is 29.2 mmHg.   Left Atrium: Left atrial size was normal in size.   Right Atrium: Right atrial size was normal in size.   Pericardium: There is no evidence of pericardial effusion. Presence of  epicardial fat layer.   Mitral Valve: The mitral valve is normal in structure. Trivial mitral  valve regurgitation. No evidence of mitral valve stenosis.   Tricuspid Valve: The tricuspid valve is normal in structure. Tricuspid  valve regurgitation is trivial.   Aortic Valve: The aortic valve is tricuspid. Aortic valve regurgitation is  not visualized. No aortic stenosis is present. Aortic valve mean gradient  measures 4.0 mmHg. Aortic valve peak gradient measures 9.0 mmHg. Aortic  valve area, by VTI measures 2.85  cm.   Pulmonic Valve: The pulmonic valve was not well visualized. Pulmonic valve  regurgitation is not visualized.   Aorta: Aortic dilatation noted. There is dilatation of the aortic root,  measuring 43 mm. There is dilatation of the ascending aorta, measuring 39  mm.   Venous: The inferior vena cava is normal in size with greater than 50%  respiratory variability, suggesting right atrial pressure of 3 mmHg.   IAS/Shunts: The interatrial septum was not well visualized.   Additional Comments: 3D was performed not requiring image post processing  on an independent workstation and was normal.     LEFT VENTRICLE  PLAX 2D  LVIDd:         5.13 cm         Diastology  LVIDs:  2.81 cm         LV e' medial:    5.98 cm/s  LV PW:         0.76 cm         LV E/e' medial:  14.1  LV IVS:        0.90 cm         LV e' lateral:   7.76 cm/s  LVOT diam:     2.39 cm         LV E/e' lateral: 10.9  LV SV:         84  LV SV Index:   39  LVOT Area:     4.49 cm        3D Volume EF                                 LV 3D EF:    Left                                              ventricul                                              ar                                               ejection                                              fraction                                              by 3D                                              volume is                                              64 %.                                   3D Volume EF:                                 3D EF:        64 %  LV EDV:       205 ml                                 LV ESV:       75 ml                                 LV SV:        130 ml   RIGHT VENTRICLE  RV S prime:     15.30 cm/s  TAPSE (M-mode): 2.4 cm   LEFT ATRIUM             Index        RIGHT ATRIUM           Index  LA diam:        4.22 cm 1.94 cm/m   RA Area:     16.20 cm  LA Vol (A2C):   35.7 ml 16.42 ml/m  RA Volume:   43.60 ml  20.06 ml/m  LA Vol (A4C):   57.3 ml 26.36 ml/m  LA Biplane Vol: 46.7 ml 21.49 ml/m   AORTIC VALVE                    PULMONIC VALVE  AV Area (Vmax):    2.94 cm     PV Vmax:       1.02 m/s  AV Area (Vmean):   2.61 cm     PV Peak grad:  4.1 mmHg  AV Area (VTI):     2.85 cm  AV Vmax:           150.00 cm/s  AV Vmean:          95.100 cm/s  AV VTI:            0.294 m  AV Peak Grad:      9.0 mmHg  AV Mean Grad:      4.0 mmHg  LVOT Vmax:         98.40 cm/s  LVOT Vmean:        55.400 cm/s  LVOT VTI:          0.187 m  LVOT/AV VTI ratio: 0.64    AORTA  Ao Root diam: 4.32 cm  Ao Asc diam:  3.90 cm  Ao Arch diam: 3.4 cm   MITRAL VALVE               TRICUSPID VALVE  MV Area (PHT): 3.12 cm    TR Peak grad:   26.2 mmHg  MV Decel Time: 243 msec    TR Vmax:        256.00 cm/s  MV E velocity: 84.60 cm/s  MV A velocity: 95.30 cm/s  SHUNTS  MV E/A ratio:  0.89        Systemic VTI:  0.19 m                             Systemic Diam: 2.39 cm   Devon Nanas MD  Electronically signed by Devon Nanas MD  Signature Date/Time: 12/07/2023/2:11:19 PM        Final        Recent Radiology Findings:      I have  independently reviewed the above radiologic studies and discussed with the patient   Recent  Lab Findings: Lab Results  Component Value Date   WBC 7.6 04/03/2024   HGB 12.0 (L) 04/03/2024   HCT 36.7 (L) 04/03/2024   PLT 205 04/03/2024   GLUCOSE 136 (H) 04/03/2024   CHOL 73 (L) 12/13/2023   TRIG 123 12/13/2023   HDL 28 (L) 12/13/2023   LDLCALC 23 12/13/2023   ALT 23 09/06/2023   AST 27 09/06/2023   NA 135 04/03/2024   K 4.4 04/03/2024   CL 104 04/03/2024   CREATININE 2.46 (H) 04/03/2024   BUN 41 (H) 04/03/2024   CO2 22 04/03/2024   TSH 1.260 07/27/2021   INR 1.0 09/06/2023   HGBA1C 9.2 (H) 04/03/2024      Assessment / Plan:      - Coronary artery disease: Longstanding CAD with history of PCI to the LAD in 2004.  In January of this year, he presented once again with acute STEMI and had PCI to the diagonal coronary artery.  He described having chest pain at rest for about 2 weeks duration prior to this admission prompting elective left heart catheterization showing progression of his coronary disease with in-stent stenoses involving both the LAD and diagonal coronary arteries.  He has diffuse distal disease in the LAD, circumflex, and RCA distribution.  Most recent echocardiogram obtained in April of this year showed preserved biventricular function with no significant valvular lesions.  Dr. Shyrl will review the coronary angiography to determine if coronary bypass grafting is appropriate. Follow-up echo has been ordered.  Will also obtain pre-CABG vascular studies.  - CKD stage IIIb: Creatinine 2.46 this morning after exposure to IV contrast yesterday.  He has been given IV fluids overnight.  -Type 2 diabetes mellitus: Last hemoglobin A1c was 9.5.  He was taking metformin , Januvia, and Jardiance  prior to admission.  - History of depression and anxiety: Taking Lexapro .    I  spent 30 minutes counseling the patient face to face.   Devon JUDITHANN Becket, PA-C  04/04/2024  9:14 AM   Agree 62yo male with 3V CAD, and CKD.  He presents with a STEMI.  He does not have good PCI targets, thus surgery has been recommended.  He will need a brilinta  washout.  Devon MALVA Shyrl, MD

## 2024-04-04 NOTE — H&P (View-Only) (Signed)
 [address]       [place] [address]             [phone]        [facility] Medical Record #[id number] Date of Birth: Jan 23, 1961  Referring: Devon Wood PARAS, MD  Primary Care: Ransom Other, MD Primary Cardiologist: Jerel Balding, MD  Reason for Consult:  Evaluation for coronary bypass grafting for multivessel coronary artery disease presenting with Stable angina pectoris.    History of Present Illness:     Devon Wood is a 63 year old gentleman with a past history notable for hypertension, dyslipidemia, type 2 diabetes mellitus (hemoglobin A1c 9.5), CKD stage IIIb, spinal stenosis with cardiomyopathy, anxiety with depression, and who is a former 4-pack-year cigarette smoker.  He has class II obesity with a BMI of 38.  [name] has known coronary artery disease having undergone PCI to the LAD in 2004.  In January of this year, presented with an acute ST elevation myocardial infarction and subsequently underwent left heart catheterization with urgent PCI with a drug-eluting stent to the the firs diagonal coronary artery (previously labeled ramus intermediate coronary artery on the cath report).  At the time, left heart catheterization demonstrated diffuse multivessel coronary artery disease not felt to be amenable to further PCI.  He and his cardiologist decided to continue with medical management rather pursue referral for CABG due to potential for perioperative worsening of his stage III CKD.  He has been maintained on dual antiplatelet therapy with aspirin  81 mg and Brilinta  along with rosuvastatin , Zetia , lisinopril , and metoprolol .  He was seen in follow-up by Dr. Arnetha 03/29/2024 with complaints of chest pain over the course of the previous 2 weeks.  The pain was not relieved with Pepto-Bismol or his GERD medications but was relieved with nitroglycerin .  He also reported hypertension during these episodes with his blood pressure  as high as 202/102.  Evaluation with left heart catheterization w and in-stent stenoses as recommended.  He had elective heart catheterization yesterday showing high-grade in-stent stenoses in both the diagonal and LAD. In addition, there is diffuse mid and distal LAD disease with 3 lesions ranging from 60 to 80%.  The 1st and 2nd obtuse marginals have 75 and 80% stenoses respectively.  The posterior descending coronary artery is diffusely diseased with 70 to 90% tandem lesions.  The most recent echocardiogram was done in April of this year showing 60 to 65% left ventricular ejection fraction.  RV function was normal.  There were no significant valvular lesions present the ascending aorta was estimated at 43 mm in diameter by echo.  Follow-up CTA chest showed the ascending aorta to measure 4.0 cm which was stable compared with CTA in December 2022.  There was no evidence of dissection. [name] is referred for consideration of coronary bypass grafting in the setting of multivessel coronary artery disease with high-grade in-stent stenoses of the LAD and diagonal with progressive angina.  [name] is sitting in the bedside chair and denies having any chest pain in the past 2 days.  He is married and lives with his wife who is in poor health.  He has 2 sons who live locally who would be willing to assist him with postoperative rehab.  [name] has retired on disability after a fall at work in 2022 resulting in multiple rib fractures.  He also has chronic back and neck pain but is able to move about  unassisted.  He continues to do his own yard work.  He is right-handed.  He has not seen a dentist in several years but has no current dental issues with concerned about.   Last dose of Brilinta  was 04/02/2024.    Current Activity/ Functional Status: Patient is independent with mobility/ambulation, transfers, ADL's, IADL's.   Zubrod Score: At the time of surgery this patient's most appropriate activity status/level  should be described as: []     0    Normal activity, no symptoms []     1    Restricted in physical strenuous activity but ambulatory, able to do out light work [x]     2    Ambulatory and capable of self care, unable to do work activities, up and about                 more than 50%  Of the time                            []     3    Only limited self care, in bed greater than 50% of waking hours []     4    Completely disabled, no self care, confined to bed or chair []     5    Moribund  Past Medical History:  Diagnosis Date   Anxiety 08/02/2012   Arthritis 04/12/2012   Bronchitis    hx of;last time couple of yrs ago   Bronchitis    hx of   CAD (coronary artery disease) 10/10/2013   2004 2.0 x 25 mm drug-eluting cypher stent to LAD    Chronic back pain    L3 buldging disc;DDD   CKD (chronic kidney disease), stage III (HCC) 09/07/2023   Complication of anesthesia    20-30 second asystole episode during induction  04/2003,  was on b-blocker tx at the time, found to have 80% LAD lesion s/p stent (but unclear if CAD was the direct cause)    Coronary artery disease 2004   stent placement, sees Dr [facility]   Depression 2006   Diabetes mellitus 2005   takes Glipizide  and Metformin  daily   DM type 2 causing complication (HCC) 10/10/2013   GERD (gastroesophageal reflux disease) 04/12/2012   takes Pepcid  daily   Gout 02/21/2007   hx of   History of kidney stones    HTN (hypertension) 2005   Hypercholesteremia 04/13/2012   takes Simvastatin  daily   Hypertension    takes Lisinopril  daily   Joint pain    Mixed hyperlipidemia 10/10/2013   Morbid obesity (HCC) 10/10/2013   Nocturia    Peptic ulcer     Past Surgical History:  Procedure Laterality Date   ANTERIOR CERVICAL DECOMP/DISCECTOMY FUSION N/A 07/28/2021   Procedure: Anterior Cervical Decompression Fusion Cervical four-five,Cervical five-six;  Surgeon: Onetha Kuba, MD;  Location: [facility] OR;  Service: Neurosurgery;   Laterality: N/A;   BACK SURGERY     CARDIAC CATHETERIZATION  11/27/2009   widely patent stent   COLONOSCOPY WITH PROPOFOL  N/A 03/17/2015   Procedure: COLONOSCOPY WITH PROPOFOL ;  Surgeon: Gladis MARLA Louder, MD;  Location: [facility];  Service: Endoscopy;  Laterality: N/A;   CORONARY STENT PLACEMENT  05/30/2003   proximal LAD   CORONARY/GRAFT ACUTE MI REVASCULARIZATION N/A 09/06/2023   Procedure: Coronary/Graft Acute MI Revascularization;  Surgeon: Devon Wood PARAS, MD;  Location: [facility];  Service: Cardiovascular;  Laterality: N/A;   ESOPHAGOGASTRODUODENOSCOPY  in the 90's   HAND SURGERY  1980   right   LEFT HEART CATH AND CORONARY ANGIOGRAPHY N/A 09/06/2023   Procedure: LEFT HEART CATH AND CORONARY ANGIOGRAPHY;  Surgeon: Devon Wood PARAS, MD;  Location: [facility];  Service: Cardiovascular;  Laterality: N/A;   LEFT HEART CATH AND CORONARY ANGIOGRAPHY N/A 04/03/2024   Procedure: LEFT HEART CATH AND CORONARY ANGIOGRAPHY;  Surgeon: Devon Wood PARAS, MD;  Location: [facility];  Service: Cardiovascular;  Laterality: N/A;   LUMBAR DISC SURGERY  2002   L5   LUMBAR FUSION  2004   NM MYOCAR PERF WALL MOTION  05/19/2004   no evidence of ischemia   POSTERIOR CERVICAL FUSION/FORAMINOTOMY N/A 08/11/2021   Procedure: Post Cervical Lami/Multi Level - Cervical four-Cervical five - Cervical five-Cervical six with lateral mass screw fixation;  Surgeon: Onetha Kuba, MD;  Location: [facility] OR;  Service: Neurosurgery;  Laterality: N/A;   TONSILLECTOMY  1969   US  ECHOCARDIOGRAPHY  04/30/2003   mildly dilated aortic root,mild LA enlargement,mild concentric LVH,trivial mitral and tricuspid regurg.,mild mitral annular ca+    Social History   Tobacco Use  Smoking Status Former   Current packs/day: 0.00   Average packs/day: 0.5 packs/day for 4.0 years (2.0 ttl pk-yrs)   Types: Cigarettes   Start date: 54   Quit date: 52   Years since quitting: 41.6  Smokeless Tobacco Former    Types: Chew    Social History   Substance and Sexual Activity  Alcohol Use Yes   Comment: occasionally     Allergies  Allergen Reactions   Erythromycin Rash    Current Facility-Administered Medications  Medication Dose Route Frequency Provider Last Rate Last Admin   0.9 %  sodium chloride  infusion   Intravenous Continuous Patwardhan, Manish J, MD 75 mL/hr at 04/03/24 2025 New Bag at 04/03/24 2025   0.9 %  sodium chloride  infusion  250 mL Intravenous PRN Patwardhan, Manish J, MD       acetaminophen  (TYLENOL ) tablet 650 mg  650 mg Oral Q4H PRN Patwardhan, Manish J, MD       aspirin  EC tablet 81 mg  81 mg Oral Daily Patwardhan, Manish J, MD   81 mg at 04/04/24 0842   escitalopram  (LEXAPRO ) tablet 10 mg  10 mg Oral Daily Patwardhan, Manish J, MD   10 mg at 04/04/24 0841   ezetimibe  (ZETIA ) tablet 10 mg  10 mg Oral Daily Patwardhan, Manish J, MD   10 mg at 04/04/24 0842   famotidine  (PEPCID ) tablet 20 mg  20 mg Oral Daily Patwardhan, Manish J, MD   20 mg at 04/04/24 0842   fluticasone  (FLONASE ) 50 MCG/ACT nasal spray 2 spray  2 spray Each Nare Daily PRN Patwardhan, Manish J, MD       heparin  injection 5,000 Units  5,000 Units Subcutaneous Q8H Patwardhan, Manish J, MD   5,000 Units at 04/04/24 0453   insulin  aspart (novoLOG ) injection 0-5 Units  0-5 Units Subcutaneous QHS Patwardhan, Manish J, MD   2 Units at 04/03/24 2147   insulin  aspart (novoLOG ) injection 0-9 Units  0-9 Units Subcutaneous TID WC Patwardhan, Manish J, MD   2 Units at 04/04/24 0841   insulin  aspart (novoLOG ) injection 3 Units  3 Units Subcutaneous TID WC Patwardhan, Manish J, MD   3 Units at 04/04/24 0842   metoprolol  succinate (TOPROL -XL) 24 hr tablet 50 mg  50 mg Oral Daily Patwardhan, Manish J, MD   50 mg at 04/04/24 [phone]  nitroGLYCERIN  (NITROSTAT ) SL tablet 0.4 mg  0.4 mg Sublingual Q5 min PRN Patwardhan, Manish J, MD       ondansetron  (ZOFRAN ) injection 4 mg  4 mg Intravenous Q6H PRN Patwardhan, Manish J, MD        rosuvastatin  (CRESTOR ) tablet 20 mg  20 mg Oral Daily Patwardhan, Manish J, MD   20 mg at 04/04/24 0842   sodium chloride  flush (NS) 0.9 % injection 3 mL  3 mL Intravenous Q12H Patwardhan, Manish J, MD   3 mL at 04/04/24 9157   sodium chloride  flush (NS) 0.9 % injection 3 mL  3 mL Intravenous PRN Patwardhan, Manish J, MD        Medications Prior to Admission  Medication Sig Dispense Refill Last Dose/Taking   aspirin  81 MG tablet Take 81 mg by mouth daily.   04/03/2024 at  9:00 AM   co-enzyme Q-10 30 MG capsule Take 30 mg by mouth 3 (three) times daily.   04/03/2024   empagliflozin  (JARDIANCE ) 10 MG TABS tablet Take 10 mg by mouth daily.   04/02/2024   escitalopram  (LEXAPRO ) 10 MG tablet Take 10 mg by mouth daily.   04/03/2024 at  9:00 AM   ezetimibe  (ZETIA ) 10 MG tablet Take 1 tablet (10 mg total) by mouth daily. 90 tablet 3 04/03/2024 at  9:00 AM   famotidine  (PEPCID ) 20 MG tablet Take 20 mg by mouth daily.    04/03/2024 at  9:00 AM   hydrochlorothiazide  (MICROZIDE ) 12.5 MG capsule Take 12.5 mg by mouth daily.   04/02/2024   meloxicam  (MOBIC ) 15 MG tablet Take 15 mg by mouth daily.   04/02/2024   metFORMIN  (GLUCOPHAGE ) 1000 MG tablet Take 1,000 mg by mouth 2 (two) times daily with a meal.   04/02/2024   metoprolol  succinate (TOPROL -XL) 50 MG 24 hr tablet Take 1 tablet (50 mg total) by mouth daily. 90 tablet 3 04/03/2024 at  9:00 AM   nitroGLYCERIN  (NITROSTAT ) 0.4 MG SL tablet Place 1 tablet (0.4 mg total) under the tongue every 5 (five) minutes as needed. 25 tablet 11 Taking As Needed   rosuvastatin  (CRESTOR ) 20 MG tablet Take 1 tablet (20 mg total) by mouth daily. 90 tablet 3 04/03/2024 at  9:00 AM   sitaGLIPtin (JANUVIA) 100 MG tablet Take 50 mg by mouth daily.   04/02/2024   fluticasone  (FLONASE ) 50 MCG/ACT nasal spray Place 2 sprays into both nostrils daily as needed for allergies.   More than a month    Family History  Problem Relation Age of Onset   COPD Mother    Heart attack Father    COPD  Brother    COPD Brother    Dementia Maternal Grandmother    Cancer Maternal Grandfather    Heart failure Paternal Grandmother    Heart attack Paternal Grandfather    Hypertension Sister    Anesthesia problems Neg Hx    Hypotension Neg Hx    Malignant hyperthermia Neg Hx    Pseudochol deficiency Neg Hx      Review of Systems:     Cardiac Review of Systems: Y or  [    ]= no  Chest Pain [prior to admission, none in the past 48 hours]  Resting SOB [   ] Exertional SOB  [ x ]  Orthopnea [  ]   Pedal Edema [   ]    Palpitations [  ] Syncope  [  ]   Presyncope [   ]  General  Review of Systems: [Y] = yes [  ]=no Constitional: recent weight change [  ]; anorexia [  ]; fatigue [  ]; nausea [  ]; night sweats [  ]; fever [  ]; or chills [  ]                                                               Dental: Last Dentist visit: Several years, no current dental issues  Eye : blurred vision [  ]; diplopia [   ]; vision changes [  ];  Amaurosis fugax[  ]; Resp: cough [  ];  wheezing[  ];  hemoptysis[  ]; shortness of breath[ x ]; paroxysmal nocturnal dyspnea[  ]; dyspnea on exertion[x  ]; or orthopnea[  ];  GI:  gallstones[  ], vomiting[  ];  dysphagia[  ]; melena[  ];  hematochezia [  ]; heartburn[  ];   Hx of  Colonoscopy[  ]; GU: kidney stones [  ]; hematuria[  ];   dysuria [  ];  nocturia[  ];  history of     obstruction [  ]; urinary frequency [  ]             Skin: rash, swelling[  ];, hair loss[  ];  peripheral edema[  ];  or itching[  ]; Musculosketetal: myalgias[  ];  joint swelling[  ];  joint erythema[  ];  joint pain[  ];  back pain[ x ];  Heme/Lymph: bruising[  ];  bleeding[  ];  anemia[  ];  Neuro: TIA[  ];  headaches[  ];  stroke[  ];  vertigo[  ];  seizures[  ];   paresthesias[  ];  difficulty walking[  ];  Psych:depression[ x]; anxiety[ x];  Endocrine: diabetes[type II];  thyroid dysfunction[  ];               Physical Exam: BP 97/62 (BP Location: Left Arm)   Pulse 63    Temp 98.7 F (37.1 C) (Oral)   Resp 14   Ht 5' 6 (1.676 m)   Wt 108 kg   SpO2 99%   BMI 38.41 kg/m    General appearance: alert, cooperative, and no distress Head: Normocephalic, without obvious abnormality, atraumatic Neck: no adenopathy, no carotid bruit, no JVD, and supple, symmetrical, trachea midline Lymph nodes: No palpable cervical or clavicular adenopathy Resp: Breath sounds full, equal, and clear to auscultation. Cardio: Regular rate and rhythm without murmur or rub.  Monitor shows sinus rhythm with first-degree AV block and no other significant arrhythmias since admission. GI: Obese, soft and nontender Extremities: No obvious deformities.  All are well-perfused no peripheral edema.  He has superficial varicosities in both lower extremities Neurologic: Grossly normal  Diagnostic Studies & Laboratory data:  LEFT HEART CATH AND CORONARY ANGIOGRAPHY   Conclusion  Coronary angiography 04/03/2024: LM: No significant disease LAD: Ostial 80% de novo stenosis           Prox-mid LAD stent with 80% restenosis           High diag with prox 80% de novo stenosis, followed by stent with focal mid stent 95% restenosis            (Stented on 09/06/2023 for STEMI, previously called ramus  intermedius)           Mid to distal LAD skip lesions of 60-80%, Distal apical LAD small caliber with diffuse 60% disease Lcx: Mild diffuse disease         Small OM1 with prox 75% disease         Medium OM2 with diffuse 70% disease RCA: Mild diffuse disease           Distal focal 75% stenosis           Ostial RPDA with 90% stenosis distal 80% diffuse disease   LVEDP 9 mmHg      Conclusion: Severe multivessel disease Progression of denovo disease as well as restenosis since 09/06/2023   Surgical revascularization potentially could achieve complete revascularization.  Concerns include patient preference against surgical revascularization, risk of perioperative complications including renal failure,  and lack of optimal targets.  Alternatively, risk of percutaneous revascularization include not being able to achieve complete revascularization, risk of contrast induced nephropathy, as well as anatomical challenges with restenosis, and risk of stent failure with future restenosis.   I will admit him overnight with hydration and obtain surgical consult.  Percutaneous revascularization would remain the second best option.  Third option would be medical therapy alone, but he will likely continue to have worsening symptoms and increased risk of MACE, in the future.   Wood JINNY Lawrence, MD     ECHOCARDIOGRAM REPORT       Patient Name:   Devon Wood Metzner Date of Exam: 12/07/2023  Medical Rec #:  [id number]     Height:       67.0 in  Accession #:    [id number]    Weight:       237.0 lb  Date of Birth:  [birth year]     BSA:          2.174 m  Patient Age:    62 years      BP:           110/70 mmHg  Patient Gender: M             HR:           73 bpm.  Exam Location:  Outpatient   Procedure: 2D Echo, 3D Echo, Color Doppler, Cardiac Doppler and  Intracardiac            Opacification Agent (Both Spectral and Color Flow Doppler were             utilized during procedure).   Indications:    R60.0 Lower extremity edema    History:        Patient has prior history of Echocardiogram examinations,  most                 recent 09/07/2023. CAD and Previous Myocardial Infarction,                  Signs/Symptoms:Edema; Risk Factors:Hypertension, Diabetes,  CKD                 stage 3, Dyslipidemia and Former Smoker. Patient denies  chest                 pain and SOB. He does have mild leg edema.    Sonographer:    Annabella Cater RVT, RDCS (AE), RDMS  Referring Phys: [id number] MADISON L FOUNTAIN     Sonographer Comments: Suboptimal apical window, patient is obese and  suboptimal parasternal window. Image acquisition challenging due to  patient body habitus.  IMPRESSIONS     1. Left ventricular ejection  fraction, by estimation, is 60 to 65%. Left  ventricular ejection fraction by 3D volume is 64 %. The left ventricle has  normal function. The left ventricle has no regional wall motion  abnormalities. Left ventricular diastolic   parameters are indeterminate.   2. Right ventricular systolic function is normal. The right ventricular  size is normal. There is normal pulmonary artery systolic pressure. The  estimated right ventricular systolic pressure is 29.2 mmHg.   3. The mitral valve is normal in structure. Trivial mitral valve  regurgitation. No evidence of mitral stenosis.   4. The aortic valve is tricuspid. Aortic valve regurgitation is not  visualized. No aortic stenosis is present.   5. Aortic dilatation noted. There is dilatation of the aortic root,  measuring 43 mm. There is dilatation of the ascending aorta, measuring 39  mm.   6. The inferior vena cava is normal in size with greater than 50%  respiratory variability, suggesting right atrial pressure of 3 mmHg.   Comparison(s): EF 50%, hypokinesis anterolateral and mid-apical anterior  walls, AOR 44mm.   FINDINGS   Left Ventricle: Left ventricular ejection fraction, by estimation, is 60  to 65%. Left ventricular ejection fraction by 3D volume is 64 %. The left  ventricle has normal function. The left ventricle has no regional wall  motion abnormalities. Definity   contrast agent was given IV to delineate the left ventricular endocardial  borders. The left ventricular internal cavity size was normal in size.  There is no left ventricular hypertrophy. Left ventricular diastolic  parameters are indeterminate.   Right Ventricle: The right ventricular size is normal. No increase in  right ventricular wall thickness. Right ventricular systolic function is  normal. There is normal pulmonary artery systolic pressure. The tricuspid  regurgitant velocity is 2.56 m/s, and   with an assumed right atrial pressure of 3 mmHg, the  estimated right  ventricular systolic pressure is 29.2 mmHg.   Left Atrium: Left atrial size was normal in size.   Right Atrium: Right atrial size was normal in size.   Pericardium: There is no evidence of pericardial effusion. Presence of  epicardial fat layer.   Mitral Valve: The mitral valve is normal in structure. Trivial mitral  valve regurgitation. No evidence of mitral valve stenosis.   Tricuspid Valve: The tricuspid valve is normal in structure. Tricuspid  valve regurgitation is trivial.   Aortic Valve: The aortic valve is tricuspid. Aortic valve regurgitation is  not visualized. No aortic stenosis is present. Aortic valve mean gradient  measures 4.0 mmHg. Aortic valve peak gradient measures 9.0 mmHg. Aortic  valve area, by VTI measures 2.85  cm.   Pulmonic Valve: The pulmonic valve was not well visualized. Pulmonic valve  regurgitation is not visualized.   Aorta: Aortic dilatation noted. There is dilatation of the aortic root,  measuring 43 mm. There is dilatation of the ascending aorta, measuring 39  mm.   Venous: The inferior vena cava is normal in size with greater than 50%  respiratory variability, suggesting right atrial pressure of 3 mmHg.   IAS/Shunts: The interatrial septum was not well visualized.   Additional Comments: 3D was performed not requiring image post processing  on an independent workstation and was normal.     LEFT VENTRICLE  PLAX 2D  LVIDd:         5.13 cm         Diastology  LVIDs:  2.81 cm         LV e' medial:    5.98 cm/s  LV PW:         0.76 cm         LV E/e' medial:  14.1  LV IVS:        0.90 cm         LV e' lateral:   7.76 cm/s  LVOT diam:     2.39 cm         LV E/e' lateral: 10.9  LV SV:         84  LV SV Index:   39  LVOT Area:     4.49 cm        3D Volume EF                                 LV 3D EF:    Left                                              ventricul                                              ar                                               ejection                                              fraction                                              by 3D                                              volume is                                              64 %.                                   3D Volume EF:                                 3D EF:        64 %  LV EDV:       205 ml                                 LV ESV:       75 ml                                 LV SV:        130 ml   RIGHT VENTRICLE  RV S prime:     15.30 cm/s  TAPSE (M-mode): 2.4 cm   LEFT ATRIUM             Index        RIGHT ATRIUM           Index  LA diam:        4.22 cm 1.94 cm/m   RA Area:     16.20 cm  LA Vol (A2C):   35.7 ml 16.42 ml/m  RA Volume:   43.60 ml  20.06 ml/m  LA Vol (A4C):   57.3 ml 26.36 ml/m  LA Biplane Vol: 46.7 ml 21.49 ml/m   AORTIC VALVE                    PULMONIC VALVE  AV Area (Vmax):    2.94 cm     PV Vmax:       1.02 m/s  AV Area (Vmean):   2.61 cm     PV Peak grad:  4.1 mmHg  AV Area (VTI):     2.85 cm  AV Vmax:           150.00 cm/s  AV Vmean:          95.100 cm/s  AV VTI:            0.294 m  AV Peak Grad:      9.0 mmHg  AV Mean Grad:      4.0 mmHg  LVOT Vmax:         98.40 cm/s  LVOT Vmean:        55.400 cm/s  LVOT VTI:          0.187 m  LVOT/AV VTI ratio: 0.64    AORTA  Ao Root diam: 4.32 cm  Ao Asc diam:  3.90 cm  Ao Arch diam: 3.4 cm   MITRAL VALVE               TRICUSPID VALVE  MV Area (PHT): 3.12 cm    TR Peak grad:   26.2 mmHg  MV Decel Time: 243 msec    TR Vmax:        256.00 cm/s  MV E velocity: 84.60 cm/s  MV A velocity: 95.30 cm/s  SHUNTS  MV E/A ratio:  0.89        Systemic VTI:  0.19 m                             Systemic Diam: 2.39 cm   Lonni Nanas MD  Electronically signed by Lonni Nanas MD  Signature Date/Time: 12/07/2023/2:11:19 PM        Final        Recent Radiology Findings:      I have  independently reviewed the above radiologic studies and discussed with the patient   Recent  Lab Findings: Lab Results  Component Value Date   WBC 7.6 04/03/2024   HGB 12.0 (L) 04/03/2024   HCT 36.7 (L) 04/03/2024   PLT 205 04/03/2024   GLUCOSE 136 (H) 04/03/2024   CHOL 73 (L) 12/13/2023   TRIG 123 12/13/2023   HDL 28 (L) 12/13/2023   LDLCALC 23 12/13/2023   ALT 23 09/06/2023   AST 27 09/06/2023   NA 135 04/03/2024   K 4.4 04/03/2024   CL 104 04/03/2024   CREATININE 2.46 (H) 04/03/2024   BUN 41 (H) 04/03/2024   CO2 22 04/03/2024   TSH 1.260 07/27/2021   INR 1.0 09/06/2023   HGBA1C 9.2 (H) 04/03/2024      Assessment / Plan:      - Coronary artery disease: Longstanding CAD with history of PCI to the LAD in 2004.  In January of this year, he presented once again with acute STEMI and had PCI to the diagonal coronary artery.  He described having chest pain at rest for about 2 weeks duration prior to this admission prompting elective left heart catheterization showing progression of his coronary disease with in-stent stenoses involving both the LAD and diagonal coronary arteries.  He has diffuse distal disease in the LAD, circumflex, and RCA distribution.  Most recent echocardiogram obtained in April of this year showed preserved biventricular function with no significant valvular lesions.  Dr. Shyrl will review the coronary angiography to determine if coronary bypass grafting is appropriate. Follow-up echo has been ordered.  Will also obtain pre-CABG vascular studies.  - CKD stage IIIb: Creatinine 2.46 this morning after exposure to IV contrast yesterday.  He has been given IV fluids overnight.  -Type 2 diabetes mellitus: Last hemoglobin A1c was 9.5.  He was taking metformin , Januvia, and Jardiance  prior to admission.  - History of depression and anxiety: Taking Lexapro .    I  spent 30 minutes counseling the patient face to face.   Laurel JUDITHANN Becket, PA-C  04/04/2024  9:14 AM   Agree 62yo male with 3V CAD, and CKD.  He presents with a STEMI.  He does not have good PCI targets, thus surgery has been recommended.  He will need a brilinta  washout.  Linnie MALVA Shyrl, MD

## 2024-04-04 NOTE — Progress Notes (Signed)
 PRE CABG exams are completed. Kashari Chalmers, RVT

## 2024-04-04 NOTE — Care Management CC44 (Signed)
 Condition Code 44 Documentation Completed  Patient Details  Name: Devon Wood MRN: [id number] Date of Birth: 20-Aug-1961   Condition Code 44 given:  Yes Patient signature on Condition Code 44 notice:  Yes Documentation of 2 MD's agreement:  Yes Code 44 added to claim:  Yes    Ansley Mangiapane, RN 04/04/2024, 5:37 PM

## 2024-04-04 NOTE — Care Management Obs Status (Signed)
 MEDICARE OBSERVATION STATUS NOTIFICATION   Patient Details  Name: Devon Wood MRN: [id number] Date of Birth: 1961/08/19   Medicare Observation Status Notification Given:  Yes    Mads Borgmeyer, RN 04/04/2024, 5:37 PM

## 2024-04-04 NOTE — Progress Notes (Signed)
  Progress Note  Patient Name: ANIKEN MONESTIME Date of Encounter: 04/04/2024 [facility] Cardiologist: Jerel Balding, MD   Interval Summary    No chest pain overnight, eager for a plan.   Vital Signs Vitals:   04/03/24 1933 04/04/24 0006 04/04/24 0437 04/04/24 0751  BP: 123/68 124/72 113/68 97/62  Pulse:  69 65 63  Resp: 19 20 20 14   Temp: 98.6 F (37 C) 98.4 F (36.9 C) 98.6 F (37 C) 98.7 F (37.1 C)  TempSrc: Oral Oral Oral Oral  SpO2: 100% 100% 99% 99%  Weight:      Height:       No intake or output data in the 24 hours ending 04/04/24 0824    04/03/2024   11:32 AM 03/29/2024    3:57 PM 12/15/2023   10:02 AM  Last 3 Weights  Weight (lbs) 238 lb 240 lb 240 lb  Weight (kg) 107.956 kg 108.863 kg 108.863 kg      Telemetry/ECG   Sinus Rhythm - Personally Reviewed  Physical Exam  GEN: No acute distress.   Neck: No JVD Cardiac: RRR, no murmurs, rubs, or gallops.  Respiratory: Clear to auscultation bilaterally. GI: Soft, nontender, non-distended  MS: No edema Skin: right femoral cath site stable  Assessment & Plan  63 year old male with past medical history of CAD s/p stenting of LAD and diag, diabetes, hyperlipidemia, hypertension, CKD stage IIIb who was seen in the office on 7/25 with chest pain.  Given his symptoms he was set up for outpatient cardiac catheterization.  CAD Unstable angina -- Underwent cardiac catheterization 7/30 with ostial 80% de novo stenosis, proximal/mid LAD stent with 80% in-stent restenosis, high diagonal with 80% Denovo stenosis followed by stent with mid stent 95% in-stent restenosis, small OM1 75%, OM2 70%, distal RCA 75%.  Recommendations for CVTS consult -- continue ASA, Toprol , Crestor , Zetia   HTN -- controlled -- continue Toprol   HLD -- on Crestor  20mg  daily, Zetia  10mg  daily  DM -- Hgb A1c 9.2 -- on SSI  -- metformin , jardiance  and januvia PTA  CKD stage 3b -- baseline Cr 2.1-2.3, 2.6>>2.4 yesterday. BMET  pending -- holding lisinopril  for now   Mild aortic dilation -- CT 12/2023 with ascending thoracic aorta 4.0cm    For questions or updates, please contact [facility] Please consult [email or web address] for contact info under       Signed, Manuelita Rummer, NP

## 2024-04-04 NOTE — TOC Initial Note (Signed)
 Transition of Care [facility]) - Initial/Assessment Note    Patient Details  Name: Devon Wood MRN: [id number] Date of Birth: [birth year]  [facility]) CM/SW Contact:    Sudie Erminio Deems, RN Phone Number: 04/04/2024, 11:24 AM  Clinical Narrative: Patient presented for CAD-post LHC. Plan for surgical consultation today. PTA patient was independent from home with spouse. Patient states he does not use any DME in the home. Patient has PCP and insurance; has transportation to all appointments. No home need identified at this time. Case Manager will continue to follow for disposition needs.    Expected Discharge Plan: Home/Self Care Barriers to Discharge: Continued Medical Work up   Patient Goals and CMS Choice Patient states their goals for this hospitalization and ongoing recovery are:: Patient to return home once stable   Choice offered to / list presented to : NA      Expected Discharge Plan and Services In-house Referral: NA Discharge Planning Services: CM Consult Post Acute Care Choice: NA Living arrangements for the past 2 months: Single Family Home                   DME Agency: NA       HH Arranged: NA          Prior Living Arrangements/Services Living arrangements for the past 2 months: Single Family Home Lives with:: Spouse Patient language and need for interpreter reviewed:: Yes        Need for Family Participation in Patient Care: No (Comment) Care giver support system in place?: No (comment)   Criminal Activity/Legal Involvement Pertinent to Current Situation/Hospitalization: No - Comment as needed  Activities of Daily Living   ADL Screening (condition at time of admission) Independently performs ADLs?: Yes (appropriate for developmental age) Is the patient deaf or have difficulty hearing?: No Does the patient have difficulty seeing, even when wearing glasses/contacts?: No Does the patient have difficulty concentrating, remembering,  or making decisions?: No  Permission Sought/Granted Permission sought to share information with : Case Manager, Family Supports                Emotional Assessment Appearance:: Appears stated age Attitude/Demeanor/Rapport: Engaged Affect (typically observed): Appropriate Orientation: : Oriented to Self, Oriented to Place, Oriented to  Time, Oriented to Situation Alcohol / Substance Use: Not Applicable Psych Involvement: No (comment)  Admission diagnosis:  CAD (coronary artery disease) [I25.10] Patient Active Problem List   Diagnosis Date Noted   CKD (chronic kidney disease), stage III (HCC) 09/07/2023   STEMI (ST elevation myocardial infarction) (HCC) 09/06/2023   Myelopathy concurrent with and due to spinal stenosis of cervical region (HCC) 08/11/2021   Myelopathy (HCC) 07/28/2021   Painful orthopaedic hardware (HCC) 10/24/2018   Dyslipidemia (high LDL; low HDL) 09/29/2018   Spinal stenosis 04/05/2017   Severe obesity (BMI 35.0-35.9 with comorbidity) (HCC) 04/04/2017   CAD (coronary artery disease) 10/10/2013   Mixed hyperlipidemia 10/10/2013   DM type 2 causing complication (HCC) 10/10/2013   Essential hypertension 10/10/2013   Morbid obesity (HCC) 10/10/2013   Lumbar spinal stenosis 10/10/2013   PCP:  Ransom Other, MD Pharmacy:   [facility] [phone] [address] [place], [place] - [address] [place] [address] Phone: [phone] Fax: [phone]     Social Drivers of Health (SDOH) Social History: SDOH Screenings   Food Insecurity: No Food Insecurity (04/03/2024)  Housing: Low Risk  (04/03/2024)  Transportation Needs: No Transportation Needs (04/03/2024)  Utilities: Not At Risk (04/03/2024)  Financial Resource Strain: Low Risk  (09/13/2023)  Social Connections: Socially Integrated (09/06/2023)  Tobacco Use: Medium Risk (12/15/2023)  Health Literacy: Adequate Health Literacy (09/13/2023)   SDOH Interventions:     Readmission Risk  Interventions     No data to display

## 2024-04-05 ENCOUNTER — Encounter: Payer: Self-pay | Admitting: *Deleted

## 2024-04-05 ENCOUNTER — Other Ambulatory Visit: Payer: Self-pay | Admitting: *Deleted

## 2024-04-05 DIAGNOSIS — I2510 Atherosclerotic heart disease of native coronary artery without angina pectoris: Secondary | ICD-10-CM

## 2024-04-05 MED ORDER — ~~LOC~~ CARDIAC SURGERY, PATIENT & FAMILY EDUCATION: Freq: Once | Status: AC

## 2024-04-08 ENCOUNTER — Encounter (HOSPITAL_COMMUNITY): Payer: Self-pay

## 2024-04-08 ENCOUNTER — Other Ambulatory Visit: Payer: Self-pay | Admitting: *Deleted

## 2024-04-08 ENCOUNTER — Ambulatory Visit (HOSPITAL_COMMUNITY)
Admission: RE | Admit: 2024-04-08 | Discharge: 2024-04-08 | Disposition: A | Discharge status: Home / Self Care | Source: Ambulatory Visit | Attending: Thoracic Surgery (Cardiothoracic Vascular Surgery)

## 2024-04-08 ENCOUNTER — Encounter (HOSPITAL_COMMUNITY)
Admission: RE | Admit: 2024-04-08 | Discharge: 2024-04-08 | Disposition: A | Discharge status: Home / Self Care | Source: Ambulatory Visit | Attending: Thoracic Surgery (Cardiothoracic Vascular Surgery) | Admitting: Thoracic Surgery (Cardiothoracic Vascular Surgery)

## 2024-04-08 ENCOUNTER — Other Ambulatory Visit: Payer: Self-pay

## 2024-04-08 VITALS — BP 125/67 | HR 66 | Temp 98.2°F | Resp 18 | Ht 66.0 in | Wt 235.5 lb

## 2024-04-08 DIAGNOSIS — Z01818 Encounter for other preprocedural examination: Secondary | ICD-10-CM | POA: Insufficient documentation

## 2024-04-08 DIAGNOSIS — I2510 Atherosclerotic heart disease of native coronary artery without angina pectoris: Secondary | ICD-10-CM

## 2024-04-08 DIAGNOSIS — Z5339 Other specified procedure converted to open procedure: Secondary | ICD-10-CM

## 2024-04-08 HISTORY — DX: Angina pectoris, unspecified: I20.9

## 2024-04-08 LAB — URINALYSIS, ROUTINE W REFLEX MICROSCOPIC
Bacteria, UA: NONE SEEN
Bilirubin Urine: NEGATIVE
Glucose, UA: >=500 mg/dL — AB
Ketones, ur: NEGATIVE mg/dL
Nitrite: NEGATIVE
Protein, ur: NEGATIVE mg/dL
Specific Gravity, Urine: 1.014 (ref 1.005–1.030)
pH: 5 (ref 5.0–8.0)

## 2024-04-08 LAB — COMPREHENSIVE METABOLIC PANEL WITH GFR
ALT: 26 U/L (ref 0–44)
AST: 28 U/L (ref 15–41)
Albumin: 4.1 g/dL (ref 3.5–5.0)
Alkaline Phosphatase: 51 U/L (ref 38–126)
Anion gap: 9 (ref 5–15)
BUN: 38 mg/dL — ABNORMAL HIGH (ref 8–23)
CO2: 22 mmol/L (ref 22–32)
Calcium: 8.9 mg/dL (ref 8.9–10.3)
Chloride: 103 mmol/L (ref 98–111)
Creatinine, Ser: 2.32 mg/dL — ABNORMAL HIGH (ref 0.61–1.24)
GFR, Estimated: 31 mL/min — ABNORMAL LOW (ref >60)
Glucose, Bld: 196 mg/dL — ABNORMAL HIGH (ref 70–99)
Potassium: 4.8 mmol/L (ref 3.5–5.1)
Sodium: 134 mmol/L — ABNORMAL LOW (ref 135–145)
Total Bilirubin: 0.7 mg/dL (ref 0.0–1.2)
Total Protein: 7.7 g/dL (ref 6.5–8.1)

## 2024-04-08 LAB — CBC
HCT: 37.6 % — ABNORMAL LOW (ref 39.0–52.0)
Hemoglobin: 12.2 g/dL — ABNORMAL LOW (ref 13.0–17.0)
MCH: 28.8 pg (ref 26.0–34.0)
MCHC: 32.4 g/dL (ref 30.0–36.0)
MCV: 88.7 fL (ref 80.0–100.0)
Platelets: 231 K/uL (ref 150–400)
RBC: 4.24 MIL/uL (ref 4.22–5.81)
RDW: 13.1 % (ref 11.5–15.5)
WBC: 8.2 K/uL (ref 4.0–10.5)
nRBC: 0 % (ref 0.0–0.2)

## 2024-04-08 LAB — GLUCOSE, CAPILLARY: Glucose-Capillary: 169 mg/dL — ABNORMAL HIGH (ref 70–99)

## 2024-04-08 LAB — SURGICAL PCR SCREEN
MRSA, PCR: NEGATIVE
Staphylococcus aureus: NEGATIVE

## 2024-04-08 NOTE — Progress Notes (Signed)
 PCP - Dr. Ardell Manly Cardiologist - Dr. Jerel Croitoru  PPM/ICD - denies   Chest x-ray - 04/08/24 EKG - 03/29/24 Stress Test - 05/19/04 ECHO - 04/04/24 Cardiac Cath - 04/03/24  Sleep Study - denies   Fasting Blood Sugar - 160-170 Checks Blood Sugar twice a day  Last dose Jardiance  8/2 Last dose Metformin  8/3   Last dose of GLP1 agonist-  n/a    Blood Thinner Instructions: n/a Aspirin  Instructions: Hold DOS  ERAS Protcol -no, NPO   COVID TEST- n/a   Anesthesia review: yes, cardiac hx, hx of anesthesia complications (2004- no issues since)  Patient denies shortness of breath, fever, cough and chest pain at PAT appointment   All instructions explained to the patient, with a verbal understanding of the material. Patient agrees to go over the instructions while at home for a better understanding.  The opportunity to ask questions was provided.

## 2024-04-08 NOTE — Progress Notes (Signed)
 Bernardino Sprang, RN, notified about abnormal UA. Also, that APTT and PT INR labs clotted and will need to be re-drawn day of surgery. New orders placed

## 2024-04-08 NOTE — Pre-Procedure Instructions (Signed)
 Surgical Instructions   Your procedure is scheduled on Wednesday, August 6th. Report to [facility] Main Entrance A at 06:30 A.M., then check in with the Admitting office. Any questions or running late day of surgery: call [phone]  Questions prior to your surgery date: call [phone], Monday-Friday, 8am-4pm. If you experience any cold or flu symptoms such as cough, fever, chills, shortness of breath, etc. between now and your scheduled surgery, please notify us  at the above number.     Remember:  Do not eat or drink after midnight the night before your surgery    Take these medicines the morning of surgery with A SIP OF WATER  escitalopram  (LEXAPRO )  ezetimibe  (ZETIA )  famotidine  (PEPCID )  metoprolol  succinate (TOPROL -XL)  rosuvastatin  (CRESTOR )   May take these medicines IF NEEDED: fluticasone  (FLONASE )  nitroGLYCERIN  (NITROSTAT )-  If you have to take this medication prior to surgery, please call [phone] and report this to a nurse   *PLEASE STOP MELOXICAM  NOW   One week prior to surgery, STOP taking any Aleve , Naproxen , Ibuprofen , Motrin , Advil , Goody's, BC's, all herbal medications, fish oil, and non-prescription vitamins.  WHAT DO I DO ABOUT MY DIABETES MEDICATION?  *PLEASE STOP JARDIANCE  3 DAYS PRIOR TO SURGERY *PLEASE STOP METFORMIN  2 DAYS PRIOR TO SURGERY  Do not take sitaGLIPtin (JANUVIA) the morning of surgery.   HOW TO MANAGE YOUR DIABETES BEFORE AND AFTER SURGERY  Why is it important to control my blood sugar before and after surgery? Improving blood sugar levels before and after surgery helps healing and can limit problems. A way of improving blood sugar control is eating a healthy diet by:  Eating less sugar and carbohydrates  Increasing activity/exercise  Talking with your doctor about reaching your blood sugar goals High blood sugars (greater than 180 mg/dL) can raise your risk of infections and slow your recovery, so you will need to focus  on controlling your diabetes during the weeks before surgery. Make sure that the doctor who takes care of your diabetes knows about your planned surgery including the date and location.  How do I manage my blood sugar before surgery? Check your blood sugar at least 4 times a day, starting 2 days before surgery, to make sure that the level is not too high or low.  Check your blood sugar the morning of your surgery when you wake up and every 2 hours until you get to the Short Stay unit.  If your blood sugar is less than 70 mg/dL, you will need to treat for low blood sugar: Do not take insulin . Treat a low blood sugar (less than 70 mg/dL) with  cup of clear juice (cranberry or apple), 4 glucose tablets, OR glucose gel. Recheck blood sugar in 15 minutes after treatment (to make sure it is greater than 70 mg/dL). If your blood sugar is not greater than 70 mg/dL on recheck, call [phone] for further instructions. Report your blood sugar to the short stay nurse when you get to Short Stay.  If you are admitted to the hospital after surgery: Your blood sugar will be checked by the staff and you will probably be given insulin  after surgery (instead of oral diabetes medicines) to make sure you have good blood sugar levels. The goal for blood sugar control after surgery is 80-180 mg/dL.                     Do NOT Smoke (Tobacco/Vaping) for 24 hours prior to your procedure.  If  you use a CPAP at night, you may bring your mask/headgear for your overnight stay.   You will be asked to remove any contacts, glasses, piercing's, hearing aid's, dentures/partials prior to surgery. Please bring cases for these items if needed.    Patients discharged the day of surgery will not be allowed to drive home, and someone needs to stay with them for 24 hours.  SURGICAL WAITING ROOM VISITATION Patients may have no more than 2 support people in the waiting area - these visitors may rotate.   Pre-op nurse will  coordinate an appropriate time for 1 ADULT support person, who may not rotate, to accompany patient in pre-op.  Children under the age of 68 must have an adult with them who is not the patient and must remain in the main waiting area with an adult.  If the patient needs to stay at the hospital during part of their recovery, the visitor guidelines for inpatient rooms apply.  Please refer to the [facility] website for the visitor guidelines for any additional information.   If you received a COVID test during your pre-op visit  it is requested that you wear a mask when out in public, stay away from anyone that may not be feeling well and notify your surgeon if you develop symptoms. If you have been in contact with anyone that has tested positive in the last 10 days please notify you surgeon.      Pre-operative CHG Bathing Instructions   You can play a key role in reducing the risk of infection after surgery. Your skin needs to be as free of germs as possible. You can reduce the number of germs on your skin by washing with CHG (chlorhexidine  gluconate) soap before surgery. CHG is an antiseptic soap that kills germs and continues to kill germs even after washing.   DO NOT use if you have an allergy to chlorhexidine /CHG or antibacterial soaps. If your skin becomes reddened or irritated, stop using the CHG and notify one of our RNs at [phone].              TAKE A SHOWER THE NIGHT BEFORE SURGERY AND THE DAY OF SURGERY    Please keep in mind the following:  DO NOT shave, including legs and underarms, 48 hours prior to surgery.   You may shave your face before/day of surgery.  Place clean sheets on your bed the night before surgery Use a clean washcloth (not used since being washed) for each shower. DO NOT sleep with pet's night before surgery.  CHG Shower Instructions:  Wash your face and private area with normal soap. If you choose to wash your hair, wash first with your normal shampoo.   After you use shampoo/soap, rinse your hair and body thoroughly to remove shampoo/soap residue.  Turn the water OFF and apply half the bottle of CHG soap to a CLEAN washcloth.  Apply CHG soap ONLY FROM YOUR NECK DOWN TO YOUR TOES (washing for 3-5 minutes)  DO NOT use CHG soap on face, private areas, open wounds, or sores.  Pay special attention to the area where your surgery is being performed.  If you are having back surgery, having someone wash your back for you may be helpful. Wait 2 minutes after CHG soap is applied, then you may rinse off the CHG soap.  Pat dry with a clean towel  Put on clean pajamas    Additional instructions for the day of surgery: DO NOT APPLY any  lotions, deodorants, cologne, or perfumes.   Do not wear jewelry or makeup Do not wear nail polish, gel polish, artificial nails, or any other type of covering on natural nails (fingers and toes) Do not bring valuables to the hospital. [facility] is not responsible for valuables/personal belongings. Put on clean/comfortable clothes.  Please brush your teeth.  Ask your nurse before applying any prescription medications to the skin.

## 2024-04-08 NOTE — Progress Notes (Signed)
   04/08/24 0936  OBSTRUCTIVE SLEEP APNEA  Have you ever been diagnosed with sleep apnea through a sleep study? No  Do you snore loudly (loud enough to be heard through closed doors)?  0  Do you often feel tired, fatigued, or sleepy during the daytime (such as falling asleep during driving or talking to someone)? 0  Has anyone observed you stop breathing during your sleep? 0  Do you have, or are you being treated for high blood pressure? 1  BMI more than 35 kg/m2? 1  Age > 50 (1-yes) 1  Neck circumference greater than:Male 16 inches or larger, Male 17inches or larger? 1  Male Gender (Yes=1) 1  Obstructive Sleep Apnea Score 5  Score 5 or greater  Results sent to PCP

## 2024-04-09 ENCOUNTER — Encounter (HOSPITAL_COMMUNITY): Payer: Self-pay | Admitting: Thoracic Surgery (Cardiothoracic Vascular Surgery)

## 2024-04-09 LAB — HEMOGLOBIN A1C
Hgb A1c MFr Bld: 9.1 % — ABNORMAL HIGH (ref 4.8–5.6)
Mean Plasma Glucose: 214 mg/dL

## 2024-04-09 MED ORDER — PHENYLEPHRINE HCL-NACL 20-0.9 MG/250ML-% IV SOLN
30.0000 ug/min | INTRAVENOUS | Status: AC
  Administered 2024-04-10: 25 ug/min via INTRAVENOUS
  Filled 2024-04-09: qty 250

## 2024-04-09 MED ORDER — MILRINONE LACTATE IN DEXTROSE 20-5 MG/100ML-% IV SOLN
0.3000 ug/kg/min | INTRAVENOUS | Status: DC
  Filled 2024-04-09: qty 100

## 2024-04-09 MED ORDER — MANNITOL 20 % IV SOLN
INTRAVENOUS | Status: DC
  Filled 2024-04-09: qty 13

## 2024-04-09 MED ORDER — CEFAZOLIN SODIUM-DEXTROSE 2-4 GM/100ML-% IV SOLN
2.0000 g | INTRAVENOUS | Status: AC
  Administered 2024-04-10: 2 g via INTRAVENOUS
  Filled 2024-04-09: qty 100

## 2024-04-09 MED ORDER — NITROGLYCERIN IN D5W 200-5 MCG/ML-% IV SOLN
2.0000 ug/min | INTRAVENOUS | Status: DC
  Filled 2024-04-09: qty 250

## 2024-04-09 MED ORDER — TRANEXAMIC ACID (OHS) PUMP PRIME SOLUTION
2.0000 mg/kg | INTRAVENOUS | Status: DC
  Filled 2024-04-09: qty 2.14

## 2024-04-09 MED ORDER — TRANEXAMIC ACID 1000 MG/10ML IV SOLN
1.5000 mg/kg/h | INTRAVENOUS | Status: AC
  Administered 2024-04-10: 1.5 mg/kg/h via INTRAVENOUS
  Filled 2024-04-09: qty 25

## 2024-04-09 MED ORDER — PLASMA-LYTE A IV SOLN
INTRAVENOUS | Status: DC
  Filled 2024-04-09: qty 2.5

## 2024-04-09 MED ORDER — TRANEXAMIC ACID (OHS) BOLUS VIA INFUSION
15.0000 mg/kg | INTRAVENOUS | Status: AC
  Administered 2024-04-10: 1602 mg via INTRAVENOUS
  Filled 2024-04-09: qty 1602

## 2024-04-09 MED ORDER — HEPARIN 30,000 UNITS/1000 ML (OHS) CELLSAVER SOLUTION
Status: DC
  Filled 2024-04-09: qty 1000

## 2024-04-09 MED ORDER — POTASSIUM CHLORIDE 2 MEQ/ML IV SOLN
80.0000 meq | INTRAVENOUS | Status: DC
  Filled 2024-04-09: qty 40

## 2024-04-09 MED ORDER — DEXMEDETOMIDINE HCL IN NACL 400 MCG/100ML IV SOLN
0.1000 ug/kg/h | INTRAVENOUS | Status: AC
  Administered 2024-04-10: .4 ug/kg/h via INTRAVENOUS
  Filled 2024-04-09: qty 100

## 2024-04-09 MED ORDER — EPINEPHRINE HCL 5 MG/250ML IV SOLN IN NS
0.0000 ug/min | INTRAVENOUS | Status: DC
  Filled 2024-04-09: qty 250

## 2024-04-09 MED ORDER — NOREPINEPHRINE 4 MG/250ML-% IV SOLN
0.0000 ug/min | INTRAVENOUS | Status: DC
  Administered 2024-04-10: 2 ug/min via INTRAVENOUS
  Filled 2024-04-09: qty 250

## 2024-04-09 MED ORDER — VANCOMYCIN HCL 1.5 G IV SOLR
1500.0000 mg | INTRAVENOUS | Status: AC
  Administered 2024-04-10: 1500 mg via INTRAVENOUS
  Filled 2024-04-09 (×2): qty 30

## 2024-04-09 MED ORDER — INSULIN REGULAR(HUMAN) IN NACL 100-0.9 UT/100ML-% IV SOLN
INTRAVENOUS | Status: AC
  Administered 2024-04-10: 5.5 [IU]/h via INTRAVENOUS
  Filled 2024-04-09: qty 100

## 2024-04-10 ENCOUNTER — Inpatient Hospital Stay (HOSPITAL_COMMUNITY)

## 2024-04-10 ENCOUNTER — Inpatient Hospital Stay (HOSPITAL_COMMUNITY): Payer: Self-pay

## 2024-04-10 ENCOUNTER — Encounter (HOSPITAL_COMMUNITY): Payer: Self-pay | Admitting: Thoracic Surgery (Cardiothoracic Vascular Surgery)

## 2024-04-10 ENCOUNTER — Encounter (HOSPITAL_COMMUNITY)
Admission: RE | Disposition: A | Discharge status: Home / Self Care | Payer: Self-pay | Source: Home / Self Care | Attending: Thoracic Surgery (Cardiothoracic Vascular Surgery)

## 2024-04-10 ENCOUNTER — Other Ambulatory Visit: Payer: Self-pay

## 2024-04-10 ENCOUNTER — Inpatient Hospital Stay (HOSPITAL_COMMUNITY)
Admission: RE | Admit: 2024-04-10 | Discharge: 2024-04-15 | DRG: 235 | Disposition: A | Discharge status: Home / Self Care | Attending: Thoracic Surgery (Cardiothoracic Vascular Surgery) | Admitting: Thoracic Surgery (Cardiothoracic Vascular Surgery)

## 2024-04-10 DIAGNOSIS — Z881 Allergy status to other antibiotic agents status: Secondary | ICD-10-CM | POA: Diagnosis not present

## 2024-04-10 DIAGNOSIS — I2510 Atherosclerotic heart disease of native coronary artery without angina pectoris: Secondary | ICD-10-CM

## 2024-04-10 DIAGNOSIS — Z809 Family history of malignant neoplasm, unspecified: Secondary | ICD-10-CM

## 2024-04-10 DIAGNOSIS — K219 Gastro-esophageal reflux disease without esophagitis: Secondary | ICD-10-CM | POA: Diagnosis present

## 2024-04-10 DIAGNOSIS — I429 Cardiomyopathy, unspecified: Secondary | ICD-10-CM | POA: Diagnosis present

## 2024-04-10 DIAGNOSIS — Z87891 Personal history of nicotine dependence: Secondary | ICD-10-CM

## 2024-04-10 DIAGNOSIS — Z7984 Long term (current) use of oral hypoglycemic drugs: Secondary | ICD-10-CM | POA: Diagnosis not present

## 2024-04-10 DIAGNOSIS — Z79899 Other long term (current) drug therapy: Secondary | ICD-10-CM

## 2024-04-10 DIAGNOSIS — E875 Hyperkalemia: Secondary | ICD-10-CM | POA: Diagnosis present

## 2024-04-10 DIAGNOSIS — Z955 Presence of coronary angioplasty implant and graft: Secondary | ICD-10-CM

## 2024-04-10 DIAGNOSIS — Z981 Arthrodesis status: Secondary | ICD-10-CM | POA: Diagnosis not present

## 2024-04-10 DIAGNOSIS — E66812 Obesity, class 2: Secondary | ICD-10-CM | POA: Diagnosis present

## 2024-04-10 DIAGNOSIS — D62 Acute posthemorrhagic anemia: Secondary | ICD-10-CM | POA: Diagnosis not present

## 2024-04-10 DIAGNOSIS — E878 Other disorders of electrolyte and fluid balance, not elsewhere classified: Secondary | ICD-10-CM

## 2024-04-10 DIAGNOSIS — E1165 Type 2 diabetes mellitus with hyperglycemia: Secondary | ICD-10-CM | POA: Diagnosis present

## 2024-04-10 DIAGNOSIS — Z6838 Body mass index (BMI) 38.0-38.9, adult: Secondary | ICD-10-CM

## 2024-04-10 DIAGNOSIS — Z825 Family history of asthma and other chronic lower respiratory diseases: Secondary | ICD-10-CM

## 2024-04-10 DIAGNOSIS — G8929 Other chronic pain: Secondary | ICD-10-CM | POA: Diagnosis present

## 2024-04-10 DIAGNOSIS — Z8249 Family history of ischemic heart disease and other diseases of the circulatory system: Secondary | ICD-10-CM

## 2024-04-10 DIAGNOSIS — I129 Hypertensive chronic kidney disease with stage 1 through stage 4 chronic kidney disease, or unspecified chronic kidney disease: Secondary | ICD-10-CM

## 2024-04-10 DIAGNOSIS — E782 Mixed hyperlipidemia: Secondary | ICD-10-CM | POA: Diagnosis present

## 2024-04-10 DIAGNOSIS — Z7982 Long term (current) use of aspirin: Secondary | ICD-10-CM

## 2024-04-10 DIAGNOSIS — D72829 Elevated white blood cell count, unspecified: Secondary | ICD-10-CM | POA: Diagnosis not present

## 2024-04-10 DIAGNOSIS — F419 Anxiety disorder, unspecified: Secondary | ICD-10-CM | POA: Diagnosis present

## 2024-04-10 DIAGNOSIS — R5082 Postprocedural fever: Secondary | ICD-10-CM | POA: Diagnosis not present

## 2024-04-10 DIAGNOSIS — N1831 Chronic kidney disease, stage 3a: Secondary | ICD-10-CM | POA: Diagnosis not present

## 2024-04-10 DIAGNOSIS — N183 Chronic kidney disease, stage 3 unspecified: Secondary | ICD-10-CM

## 2024-04-10 DIAGNOSIS — Z5339 Other specified procedure converted to open procedure: Secondary | ICD-10-CM

## 2024-04-10 DIAGNOSIS — E1122 Type 2 diabetes mellitus with diabetic chronic kidney disease: Secondary | ICD-10-CM | POA: Diagnosis present

## 2024-04-10 DIAGNOSIS — Z01818 Encounter for other preprocedural examination: Principal | ICD-10-CM

## 2024-04-10 DIAGNOSIS — Z8711 Personal history of peptic ulcer disease: Secondary | ICD-10-CM

## 2024-04-10 DIAGNOSIS — E785 Hyperlipidemia, unspecified: Secondary | ICD-10-CM

## 2024-04-10 DIAGNOSIS — I2129 ST elevation (STEMI) myocardial infarction involving other sites: Secondary | ICD-10-CM | POA: Diagnosis present

## 2024-04-10 DIAGNOSIS — I252 Old myocardial infarction: Secondary | ICD-10-CM

## 2024-04-10 DIAGNOSIS — Z951 Presence of aortocoronary bypass graft: Secondary | ICD-10-CM

## 2024-04-10 DIAGNOSIS — N1832 Chronic kidney disease, stage 3b: Secondary | ICD-10-CM | POA: Diagnosis present

## 2024-04-10 DIAGNOSIS — E871 Hypo-osmolality and hyponatremia: Secondary | ICD-10-CM | POA: Diagnosis present

## 2024-04-10 DIAGNOSIS — Z791 Long term (current) use of non-steroidal anti-inflammatories (NSAID): Secondary | ICD-10-CM

## 2024-04-10 DIAGNOSIS — I213 ST elevation (STEMI) myocardial infarction of unspecified site: Secondary | ICD-10-CM | POA: Diagnosis not present

## 2024-04-10 DIAGNOSIS — I440 Atrioventricular block, first degree: Secondary | ICD-10-CM | POA: Diagnosis present

## 2024-04-10 DIAGNOSIS — F32A Depression, unspecified: Secondary | ICD-10-CM | POA: Diagnosis present

## 2024-04-10 HISTORY — PX: INTRAOPERATIVE TRANSESOPHAGEAL ECHOCARDIOGRAM: SHX5062

## 2024-04-10 HISTORY — PX: CORONARY ARTERY BYPASS GRAFT: SHX141

## 2024-04-10 LAB — POCT I-STAT 7, (LYTES, BLD GAS, ICA,H+H)
Acid-base deficit: 5 mmol/L — ABNORMAL HIGH (ref 0.0–2.0)
Acid-base deficit: 2 mmol/L (ref 0.0–2.0)
Acid-base deficit: 4 mmol/L — ABNORMAL HIGH (ref 0.0–2.0)
Acid-base deficit: 4 mmol/L — ABNORMAL HIGH (ref 0.0–2.0)
Acid-base deficit: 3 mmol/L — ABNORMAL HIGH (ref 0.0–2.0)
Acid-base deficit: 5 mmol/L — ABNORMAL HIGH (ref 0.0–2.0)
Acid-base deficit: 3 mmol/L — ABNORMAL HIGH (ref 0.0–2.0)
Bicarbonate: 20.2 mmol/L (ref 20.0–28.0)
Bicarbonate: 22.8 mmol/L (ref 20.0–28.0)
Bicarbonate: 21 mmol/L (ref 20.0–28.0)
Bicarbonate: 20.6 mmol/L (ref 20.0–28.0)
Bicarbonate: 23.9 mmol/L (ref 20.0–28.0)
Bicarbonate: 20.9 mmol/L (ref 20.0–28.0)
Bicarbonate: 22.6 mmol/L (ref 20.0–28.0)
Calcium, Ion: 1.01 mmol/L — ABNORMAL LOW (ref 1.15–1.40)
Calcium, Ion: 1 mmol/L — ABNORMAL LOW (ref 1.15–1.40)
Calcium, Ion: 1.19 mmol/L (ref 1.15–1.40)
Calcium, Ion: 1.09 mmol/L — ABNORMAL LOW (ref 1.15–1.40)
Calcium, Ion: 1.16 mmol/L (ref 1.15–1.40)
Calcium, Ion: 1.14 mmol/L — ABNORMAL LOW (ref 1.15–1.40)
Calcium, Ion: 1.15 mmol/L (ref 1.15–1.40)
HCT: 23 % — ABNORMAL LOW (ref 39.0–52.0)
HCT: 21 % — ABNORMAL LOW (ref 39.0–52.0)
HCT: 21 % — ABNORMAL LOW (ref 39.0–52.0)
HCT: 24 % — ABNORMAL LOW (ref 39.0–52.0)
HCT: 23 % — ABNORMAL LOW (ref 39.0–52.0)
HCT: 22 % — ABNORMAL LOW (ref 39.0–52.0)
HCT: 23 % — ABNORMAL LOW (ref 39.0–52.0)
Hemoglobin: 7.8 g/dL — ABNORMAL LOW (ref 13.0–17.0)
Hemoglobin: 7.1 g/dL — ABNORMAL LOW (ref 13.0–17.0)
Hemoglobin: 7.1 g/dL — ABNORMAL LOW (ref 13.0–17.0)
Hemoglobin: 8.2 g/dL — ABNORMAL LOW (ref 13.0–17.0)
Hemoglobin: 7.8 g/dL — ABNORMAL LOW (ref 13.0–17.0)
Hemoglobin: 7.5 g/dL — ABNORMAL LOW (ref 13.0–17.0)
Hemoglobin: 7.8 g/dL — ABNORMAL LOW (ref 13.0–17.0)
O2 Saturation: 100 %
O2 Saturation: 100 %
O2 Saturation: 95 %
O2 Saturation: 99 %
O2 Saturation: 98 %
O2 Saturation: 99 %
O2 Saturation: 98 %
Patient temperature: 37
Patient temperature: 38.1
Patient temperature: 38
Potassium: 4.4 mmol/L (ref 3.5–5.1)
Potassium: 5.3 mmol/L — ABNORMAL HIGH (ref 3.5–5.1)
Potassium: 4.7 mmol/L (ref 3.5–5.1)
Potassium: 3.9 mmol/L (ref 3.5–5.1)
Potassium: 5.2 mmol/L — ABNORMAL HIGH (ref 3.5–5.1)
Potassium: 4.5 mmol/L (ref 3.5–5.1)
Potassium: 4.6 mmol/L (ref 3.5–5.1)
Sodium: 135 mmol/L (ref 135–145)
Sodium: 138 mmol/L (ref 135–145)
Sodium: 138 mmol/L (ref 135–145)
Sodium: 143 mmol/L (ref 135–145)
Sodium: 142 mmol/L (ref 135–145)
Sodium: 142 mmol/L (ref 135–145)
Sodium: 142 mmol/L (ref 135–145)
TCO2: 21 mmol/L — ABNORMAL LOW (ref 22–32)
TCO2: 24 mmol/L (ref 22–32)
TCO2: 22 mmol/L (ref 22–32)
TCO2: 22 mmol/L (ref 22–32)
TCO2: 25 mmol/L (ref 22–32)
TCO2: 22 mmol/L (ref 22–32)
TCO2: 24 mmol/L (ref 22–32)
pCO2 arterial: 38.8 mmHg (ref 32–48)
pCO2 arterial: 38 mmHg (ref 32–48)
pCO2 arterial: 36.9 mmHg (ref 32–48)
pCO2 arterial: 35.6 mmHg (ref 32–48)
pCO2 arterial: 51 mmHg — ABNORMAL HIGH (ref 32–48)
pCO2 arterial: 42.6 mmHg (ref 32–48)
pCO2 arterial: 46 mmHg (ref 32–48)
pH, Arterial: 7.325 — ABNORMAL LOW (ref 7.35–7.45)
pH, Arterial: 7.386 (ref 7.35–7.45)
pH, Arterial: 7.364 (ref 7.35–7.45)
pH, Arterial: 7.369 (ref 7.35–7.45)
pH, Arterial: 7.279 — ABNORMAL LOW (ref 7.35–7.45)
pH, Arterial: 7.305 — ABNORMAL LOW (ref 7.35–7.45)
pH, Arterial: 7.305 — ABNORMAL LOW (ref 7.35–7.45)
pO2, Arterial: 362 mmHg — ABNORMAL HIGH (ref 83–108)
pO2, Arterial: 304 mmHg — ABNORMAL HIGH (ref 83–108)
pO2, Arterial: 77 mmHg — ABNORMAL LOW (ref 83–108)
pO2, Arterial: 129 mmHg — ABNORMAL HIGH (ref 83–108)
pO2, Arterial: 127 mmHg — ABNORMAL HIGH (ref 83–108)
pO2, Arterial: 148 mmHg — ABNORMAL HIGH (ref 83–108)
pO2, Arterial: 114 mmHg — ABNORMAL HIGH (ref 83–108)

## 2024-04-10 LAB — BASIC METABOLIC PANEL WITH GFR
Anion gap: 9 (ref 5–15)
BUN: 30 mg/dL — ABNORMAL HIGH (ref 8–23)
CO2: 22 mmol/L (ref 22–32)
Calcium: 8 mg/dL — ABNORMAL LOW (ref 8.9–10.3)
Chloride: 108 mmol/L (ref 98–111)
Creatinine, Ser: 1.96 mg/dL — ABNORMAL HIGH (ref 0.61–1.24)
GFR, Estimated: 38 mL/min — ABNORMAL LOW (ref >60)
Glucose, Bld: 133 mg/dL — ABNORMAL HIGH (ref 70–99)
Potassium: 5 mmol/L (ref 3.5–5.1)
Sodium: 139 mmol/L (ref 135–145)

## 2024-04-10 LAB — POCT I-STAT, CHEM 8
BUN: 38 mg/dL — ABNORMAL HIGH (ref 8–23)
BUN: 37 mg/dL — ABNORMAL HIGH (ref 8–23)
BUN: 37 mg/dL — ABNORMAL HIGH (ref 8–23)
BUN: 35 mg/dL — ABNORMAL HIGH (ref 8–23)
BUN: 34 mg/dL — ABNORMAL HIGH (ref 8–23)
Calcium, Ion: 1.19 mmol/L (ref 1.15–1.40)
Calcium, Ion: 1.17 mmol/L (ref 1.15–1.40)
Calcium, Ion: 1.01 mmol/L — ABNORMAL LOW (ref 1.15–1.40)
Calcium, Ion: 0.96 mmol/L — ABNORMAL LOW (ref 1.15–1.40)
Calcium, Ion: 1.17 mmol/L (ref 1.15–1.40)
Chloride: 104 mmol/L (ref 98–111)
Chloride: 105 mmol/L (ref 98–111)
Chloride: 100 mmol/L (ref 98–111)
Chloride: 101 mmol/L (ref 98–111)
Chloride: 104 mmol/L (ref 98–111)
Creatinine, Ser: 2.2 mg/dL — ABNORMAL HIGH (ref 0.61–1.24)
Creatinine, Ser: 2 mg/dL — ABNORMAL HIGH (ref 0.61–1.24)
Creatinine, Ser: 1.8 mg/dL — ABNORMAL HIGH (ref 0.61–1.24)
Creatinine, Ser: 1.8 mg/dL — ABNORMAL HIGH (ref 0.61–1.24)
Creatinine, Ser: 1.8 mg/dL — ABNORMAL HIGH (ref 0.61–1.24)
Glucose, Bld: 191 mg/dL — ABNORMAL HIGH (ref 70–99)
Glucose, Bld: 164 mg/dL — ABNORMAL HIGH (ref 70–99)
Glucose, Bld: 136 mg/dL — ABNORMAL HIGH (ref 70–99)
Glucose, Bld: 147 mg/dL — ABNORMAL HIGH (ref 70–99)
Glucose, Bld: 210 mg/dL — ABNORMAL HIGH (ref 70–99)
HCT: 32 % — ABNORMAL LOW (ref 39.0–52.0)
HCT: 29 % — ABNORMAL LOW (ref 39.0–52.0)
HCT: 24 % — ABNORMAL LOW (ref 39.0–52.0)
HCT: 20 % — ABNORMAL LOW (ref 39.0–52.0)
HCT: 24 % — ABNORMAL LOW (ref 39.0–52.0)
Hemoglobin: 10.9 g/dL — ABNORMAL LOW (ref 13.0–17.0)
Hemoglobin: 9.9 g/dL — ABNORMAL LOW (ref 13.0–17.0)
Hemoglobin: 8.2 g/dL — ABNORMAL LOW (ref 13.0–17.0)
Hemoglobin: 6.8 g/dL — CL (ref 13.0–17.0)
Hemoglobin: 8.2 g/dL — ABNORMAL LOW (ref 13.0–17.0)
Potassium: 5.3 mmol/L — ABNORMAL HIGH (ref 3.5–5.1)
Potassium: 4.6 mmol/L (ref 3.5–5.1)
Potassium: 5.4 mmol/L — ABNORMAL HIGH (ref 3.5–5.1)
Potassium: 6 mmol/L — ABNORMAL HIGH (ref 3.5–5.1)
Potassium: 4.8 mmol/L (ref 3.5–5.1)
Sodium: 135 mmol/L (ref 135–145)
Sodium: 135 mmol/L (ref 135–145)
Sodium: 135 mmol/L (ref 135–145)
Sodium: 138 mmol/L (ref 135–145)
Sodium: 138 mmol/L (ref 135–145)
TCO2: 24 mmol/L (ref 22–32)
TCO2: 21 mmol/L — ABNORMAL LOW (ref 22–32)
TCO2: 26 mmol/L (ref 22–32)
TCO2: 27 mmol/L (ref 22–32)
TCO2: 21 mmol/L — ABNORMAL LOW (ref 22–32)

## 2024-04-10 LAB — PROTIME-INR
INR: 1.9 — ABNORMAL HIGH (ref 0.8–1.2)
Prothrombin Time: 22.5 s — ABNORMAL HIGH (ref 11.4–15.2)

## 2024-04-10 LAB — CBC
HCT: 24.1 % — ABNORMAL LOW (ref 39.0–52.0)
HCT: 24.7 % — ABNORMAL LOW (ref 39.0–52.0)
Hemoglobin: 7.9 g/dL — ABNORMAL LOW (ref 13.0–17.0)
Hemoglobin: 8.2 g/dL — ABNORMAL LOW (ref 13.0–17.0)
MCH: 28.8 pg (ref 26.0–34.0)
MCH: 29.2 pg (ref 26.0–34.0)
MCHC: 32.8 g/dL (ref 30.0–36.0)
MCHC: 33.2 g/dL (ref 30.0–36.0)
MCV: 88 fL (ref 80.0–100.0)
MCV: 87.9 fL (ref 80.0–100.0)
Platelets: 109 K/uL — ABNORMAL LOW (ref 150–400)
Platelets: 152 K/uL (ref 150–400)
RBC: 2.74 MIL/uL — ABNORMAL LOW (ref 4.22–5.81)
RBC: 2.81 MIL/uL — ABNORMAL LOW (ref 4.22–5.81)
RDW: 13.1 % (ref 11.5–15.5)
RDW: 13.1 % (ref 11.5–15.5)
WBC: 7.4 K/uL (ref 4.0–10.5)
WBC: 10.7 K/uL — ABNORMAL HIGH (ref 4.0–10.5)
nRBC: 0 % (ref 0.0–0.2)
nRBC: 0 % (ref 0.0–0.2)

## 2024-04-10 LAB — POCT I-STAT EG7
Acid-base deficit: 5 mmol/L — ABNORMAL HIGH (ref 0.0–2.0)
Bicarbonate: 21.4 mmol/L (ref 20.0–28.0)
Calcium, Ion: 1.05 mmol/L — ABNORMAL LOW (ref 1.15–1.40)
HCT: 25 % — ABNORMAL LOW (ref 39.0–52.0)
Hemoglobin: 8.5 g/dL — ABNORMAL LOW (ref 13.0–17.0)
O2 Saturation: 68 %
Potassium: 4.4 mmol/L (ref 3.5–5.1)
Sodium: 135 mmol/L (ref 135–145)
TCO2: 23 mmol/L (ref 22–32)
pCO2, Ven: 42.7 mmHg — ABNORMAL LOW (ref 44–60)
pH, Ven: 7.307 (ref 7.25–7.43)
pO2, Ven: 39 mmHg (ref 32–45)

## 2024-04-10 LAB — PLATELET COUNT: Platelets: 158 K/uL (ref 150–400)

## 2024-04-10 LAB — ECHO INTRAOPERATIVE TEE
Height: 66 in
Weight: 3760 [oz_av]

## 2024-04-10 LAB — GLUCOSE, CAPILLARY: Glucose-Capillary: 194 mg/dL — ABNORMAL HIGH (ref 70–99)

## 2024-04-10 LAB — HEMOGLOBIN AND HEMATOCRIT, BLOOD
HCT: 20.7 % — ABNORMAL LOW (ref 39.0–52.0)
Hemoglobin: 6.9 g/dL — CL (ref 13.0–17.0)

## 2024-04-10 LAB — MAGNESIUM: Magnesium: 3.2 mg/dL — ABNORMAL HIGH (ref 1.7–2.4)

## 2024-04-10 LAB — PREPARE RBC (CROSSMATCH)

## 2024-04-10 LAB — APTT: aPTT: 39 s — ABNORMAL HIGH (ref 24–36)

## 2024-04-10 SURGERY — CORONARY ARTERY BYPASS GRAFTING (CABG)
Anesthesia: General | Site: Chest

## 2024-04-10 MED ORDER — METOPROLOL TARTRATE 25 MG/10 ML ORAL SUSPENSION: 12.5000 mg | Freq: Two times a day (BID) | ORAL | Status: DC

## 2024-04-10 MED ORDER — PLASMA-LYTE A IV SOLN: INTRAVENOUS | Status: DC | PRN

## 2024-04-10 MED ORDER — NOREPINEPHRINE 16 MG/250ML-% IV SOLN: 0.0000 ug/min | INTRAVENOUS | Status: DC

## 2024-04-10 MED ORDER — CEFAZOLIN SODIUM-DEXTROSE 2-4 GM/100ML-% IV SOLN
2.0000 g | Freq: Three times a day (TID) | INTRAVENOUS | Status: AC
  Administered 2024-04-10 – 2024-04-12 (×6): 2 g via INTRAVENOUS
  Filled 2024-04-10 (×6): qty 100

## 2024-04-10 MED ORDER — FENTANYL CITRATE (PF) 250 MCG/5ML IJ SOLN
INTRAMUSCULAR | Status: AC
  Filled 2024-04-10: qty 5

## 2024-04-10 MED ORDER — SODIUM CHLORIDE 0.45 % IV SOLN: INTRAVENOUS | Status: AC | PRN

## 2024-04-10 MED ORDER — SUCCINYLCHOLINE CHLORIDE 200 MG/10ML IV SOSY
PREFILLED_SYRINGE | INTRAVENOUS | Status: DC | PRN
Start: 2024-04-10 — End: 2024-04-10
  Administered 2024-04-10: 120 mg via INTRAVENOUS

## 2024-04-10 MED ORDER — ROSUVASTATIN CALCIUM 20 MG PO TABS
20.0000 mg | ORAL_TABLET | Freq: Every day | ORAL | Status: DC
  Administered 2024-04-11 – 2024-04-15 (×6): 20 mg via ORAL
  Filled 2024-04-10 (×5): qty 1

## 2024-04-10 MED ORDER — METOPROLOL TARTRATE 12.5 MG HALF TABLET: 12.5000 mg | ORAL_TABLET | Freq: Once | ORAL | Status: DC

## 2024-04-10 MED ORDER — FLUTICASONE PROPIONATE 50 MCG/ACT NA SUSP: 2.0000 | Freq: Every day | NASAL | Status: DC | PRN

## 2024-04-10 MED ORDER — ALBUMIN HUMAN 5 % IV SOLN: INTRAVENOUS | Status: DC | PRN

## 2024-04-10 MED ORDER — ACETAMINOPHEN 160 MG/5ML PO SOLN: 650.0000 mg | Freq: Once | ORAL | Status: DC

## 2024-04-10 MED ORDER — ASPIRIN 81 MG PO CHEW: 324.0000 mg | CHEWABLE_TABLET | Freq: Once | ORAL | Status: DC

## 2024-04-10 MED ORDER — MIDAZOLAM HCL 2 MG/2ML IJ SOLN: 2.0000 mg | INTRAMUSCULAR | Status: DC | PRN

## 2024-04-10 MED ORDER — CHLORHEXIDINE GLUCONATE 0.12 % MT SOLN
15.0000 mL | OROMUCOSAL | Status: AC
  Administered 2024-04-10: 15 mL via OROMUCOSAL
  Filled 2024-04-10: qty 15

## 2024-04-10 MED ORDER — DEXTROSE 50 % IV SOLN: 0.0000 mL | INTRAVENOUS | Status: DC | PRN

## 2024-04-10 MED ORDER — EPHEDRINE SULFATE-NACL 50-0.9 MG/10ML-% IV SOSY
PREFILLED_SYRINGE | INTRAVENOUS | Status: DC | PRN
  Administered 2024-04-10 (×3): 5 mg via INTRAVENOUS

## 2024-04-10 MED ORDER — LIDOCAINE 2% (20 MG/ML) 5 ML SYRINGE
INTRAMUSCULAR | Status: AC
  Filled 2024-04-10: qty 5

## 2024-04-10 MED ORDER — CHLORHEXIDINE GLUCONATE CLOTH 2 % EX PADS
6.0000 | MEDICATED_PAD | Freq: Every day | CUTANEOUS | Status: DC
  Administered 2024-04-10 – 2024-04-13 (×4): 6 via TOPICAL

## 2024-04-10 MED ORDER — ESCITALOPRAM OXALATE 10 MG PO TABS
10.0000 mg | ORAL_TABLET | Freq: Every day | ORAL | Status: DC
  Administered 2024-04-11 – 2024-04-15 (×6): 10 mg via ORAL
  Filled 2024-04-10 (×5): qty 1

## 2024-04-10 MED ORDER — PROPOFOL 10 MG/ML IV BOLUS
INTRAVENOUS | Status: DC | PRN
  Administered 2024-04-10: 50 mg via INTRAVENOUS
  Administered 2024-04-10: 100 mg via INTRAVENOUS

## 2024-04-10 MED ORDER — FENTANYL CITRATE (PF) 250 MCG/5ML IJ SOLN
INTRAMUSCULAR | Status: AC
Start: 2024-04-10 — End: 2024-04-10
  Filled 2024-04-10: qty 5

## 2024-04-10 MED ORDER — PANTOPRAZOLE SODIUM 40 MG IV SOLR
40.0000 mg | Freq: Every day | INTRAVENOUS | Status: AC
  Administered 2024-04-10 – 2024-04-11 (×2): 40 mg via INTRAVENOUS
  Filled 2024-04-10 (×2): qty 10

## 2024-04-10 MED ORDER — SODIUM CHLORIDE 0.9 % IV SOLN: INTRAVENOUS | Status: AC

## 2024-04-10 MED ORDER — SODIUM CHLORIDE 0.9% FLUSH
3.0000 mL | Freq: Two times a day (BID) | INTRAVENOUS | Status: DC
  Administered 2024-04-11 – 2024-04-12 (×4): 3 mL via INTRAVENOUS

## 2024-04-10 MED ORDER — BISACODYL 10 MG RE SUPP: 10.0000 mg | Freq: Every day | RECTAL | Status: DC

## 2024-04-10 MED ORDER — HEPARIN SODIUM (PORCINE) 1000 UNIT/ML IJ SOLN
INTRAMUSCULAR | Status: DC | PRN
  Administered 2024-04-10: 37000 [IU] via INTRAVENOUS

## 2024-04-10 MED ORDER — PHENYLEPHRINE HCL-NACL 20-0.9 MG/250ML-% IV SOLN: 0.0000 ug/min | INTRAVENOUS | Status: DC

## 2024-04-10 MED ORDER — EZETIMIBE 10 MG PO TABS
10.0000 mg | ORAL_TABLET | Freq: Every day | ORAL | Status: DC
  Administered 2024-04-11 – 2024-04-15 (×6): 10 mg via ORAL
  Filled 2024-04-10 (×5): qty 1

## 2024-04-10 MED ORDER — SODIUM CHLORIDE 0.9% FLUSH: 3.0000 mL | INTRAVENOUS | Status: DC | PRN

## 2024-04-10 MED ORDER — CHLORHEXIDINE GLUCONATE 0.12 % MT SOLN: 15.0000 mL | Freq: Once | OROMUCOSAL | Status: AC

## 2024-04-10 MED ORDER — NITROGLYCERIN IN D5W 200-5 MCG/ML-% IV SOLN: 0.0000 ug/min | INTRAVENOUS | Status: DC

## 2024-04-10 MED ORDER — PROPOFOL 10 MG/ML IV BOLUS
INTRAVENOUS | Status: AC
  Filled 2024-04-10: qty 20

## 2024-04-10 MED ORDER — PROTAMINE SULFATE 10 MG/ML IV SOLN
INTRAVENOUS | Status: DC | PRN
  Administered 2024-04-10: 350 mg via INTRAVENOUS

## 2024-04-10 MED ORDER — ACETAMINOPHEN 160 MG/5ML PO SOLN: 1000.0000 mg | Freq: Four times a day (QID) | ORAL | Status: DC

## 2024-04-10 MED ORDER — ASPIRIN 81 MG PO CHEW: 324.0000 mg | CHEWABLE_TABLET | Freq: Every day | ORAL | Status: DC

## 2024-04-10 MED ORDER — MAGNESIUM SULFATE 4 GM/100ML IV SOLN
4.0000 g | Freq: Once | INTRAVENOUS | Status: AC
  Administered 2024-04-10: 4 g via INTRAVENOUS
  Filled 2024-04-10: qty 100

## 2024-04-10 MED ORDER — ~~LOC~~ CARDIAC SURGERY, PATIENT & FAMILY EDUCATION
Freq: Once | Status: DC
  Filled 2024-04-10: qty 1

## 2024-04-10 MED ORDER — SODIUM BICARBONATE 8.4 % IV SOLN
50.0000 meq | Freq: Once | INTRAVENOUS | Status: AC
  Administered 2024-04-10: 50 meq via INTRAVENOUS

## 2024-04-10 MED ORDER — HEPARIN SODIUM (PORCINE) 1000 UNIT/ML IJ SOLN
INTRAMUSCULAR | Status: AC
  Filled 2024-04-10: qty 1

## 2024-04-10 MED ORDER — METOPROLOL TARTRATE 12.5 MG HALF TABLET
12.5000 mg | ORAL_TABLET | Freq: Two times a day (BID) | ORAL | Status: DC
  Administered 2024-04-11 – 2024-04-12 (×3): 12.5 mg via ORAL
  Filled 2024-04-10 (×4): qty 1

## 2024-04-10 MED ORDER — TRAMADOL HCL 50 MG PO TABS: 50.0000 mg | ORAL_TABLET | ORAL | Status: DC | PRN

## 2024-04-10 MED ORDER — MIDAZOLAM HCL (PF) 5 MG/ML IJ SOLN
INTRAMUSCULAR | Status: DC | PRN
  Administered 2024-04-10 (×2): 2 mg via INTRAVENOUS
  Administered 2024-04-10: 1 mg via INTRAVENOUS
  Administered 2024-04-10: 3 mg via INTRAVENOUS
  Administered 2024-04-10: 2 mg via INTRAVENOUS

## 2024-04-10 MED ORDER — ROCURONIUM BROMIDE 10 MG/ML (PF) SYRINGE
PREFILLED_SYRINGE | INTRAVENOUS | Status: DC | PRN
  Administered 2024-04-10: 70 mg via INTRAVENOUS
  Administered 2024-04-10: 30 mg via INTRAVENOUS
  Administered 2024-04-10: 100 mg via INTRAVENOUS

## 2024-04-10 MED ORDER — 0.9 % SODIUM CHLORIDE (POUR BTL) OPTIME
TOPICAL | Status: DC | PRN
  Administered 2024-04-10: 5000 mL

## 2024-04-10 MED ORDER — LACTATED RINGERS IV SOLN: INTRAVENOUS | Status: DC | PRN

## 2024-04-10 MED ORDER — PANTOPRAZOLE SODIUM 40 MG PO TBEC
40.0000 mg | DELAYED_RELEASE_TABLET | Freq: Every day | ORAL | Status: DC
  Administered 2024-04-12 – 2024-04-15 (×5): 40 mg via ORAL
  Filled 2024-04-10 (×4): qty 1

## 2024-04-10 MED ORDER — GELATIN ABSORBABLE MT POWD: OROMUCOSAL | Status: DC | PRN

## 2024-04-10 MED ORDER — MORPHINE SULFATE (PF) 2 MG/ML IV SOLN
1.0000 mg | INTRAVENOUS | Status: DC | PRN
  Administered 2024-04-10: 4 mg via INTRAVENOUS
  Administered 2024-04-10: 2 mg via INTRAVENOUS
  Administered 2024-04-10: 4 mg via INTRAVENOUS
  Filled 2024-04-10 (×2): qty 1
  Filled 2024-04-10 (×2): qty 2

## 2024-04-10 MED ORDER — ONDANSETRON HCL 4 MG/2ML IJ SOLN: 4.0000 mg | Freq: Four times a day (QID) | INTRAMUSCULAR | Status: DC | PRN

## 2024-04-10 MED ORDER — PHENYLEPHRINE 80 MCG/ML (10ML) SYRINGE FOR IV PUSH (FOR BLOOD PRESSURE SUPPORT)
PREFILLED_SYRINGE | INTRAVENOUS | Status: AC
Start: 2024-04-10 — End: 2024-04-10
  Filled 2024-04-10: qty 10

## 2024-04-10 MED ORDER — LACTATED RINGERS IV SOLN: INTRAVENOUS | Status: DC

## 2024-04-10 MED ORDER — FENTANYL CITRATE (PF) 250 MCG/5ML IJ SOLN
INTRAMUSCULAR | Status: DC | PRN
  Administered 2024-04-10: 50 ug via INTRAVENOUS
  Administered 2024-04-10: 150 ug via INTRAVENOUS
  Administered 2024-04-10: 100 ug via INTRAVENOUS
  Administered 2024-04-10: 50 ug via INTRAVENOUS
  Administered 2024-04-10: 200 ug via INTRAVENOUS
  Administered 2024-04-10: 250 ug via INTRAVENOUS

## 2024-04-10 MED ORDER — DOCUSATE SODIUM 100 MG PO CAPS
200.0000 mg | ORAL_CAPSULE | Freq: Every day | ORAL | Status: DC
  Administered 2024-04-11 – 2024-04-14 (×4): 200 mg via ORAL
  Filled 2024-04-10 (×4): qty 2

## 2024-04-10 MED ORDER — ACETAMINOPHEN 500 MG PO TABS
1000.0000 mg | ORAL_TABLET | Freq: Once | ORAL | Status: AC
  Administered 2024-04-10: 1000 mg via ORAL
  Filled 2024-04-10: qty 2

## 2024-04-10 MED ORDER — CHLORHEXIDINE GLUCONATE 0.12 % MT SOLN
OROMUCOSAL | Status: AC
  Administered 2024-04-10: 15 mL via OROMUCOSAL
  Filled 2024-04-10: qty 15

## 2024-04-10 MED ORDER — CHLORHEXIDINE GLUCONATE 4 % EX SOLN: 30.0000 mL | CUTANEOUS | Status: DC

## 2024-04-10 MED ORDER — DEXMEDETOMIDINE HCL IN NACL 400 MCG/100ML IV SOLN
0.0000 ug/kg/h | INTRAVENOUS | Status: DC
  Filled 2024-04-10: qty 100

## 2024-04-10 MED ORDER — SODIUM CHLORIDE 0.9 % IV SOLN: INTRAVENOUS | Status: DC | PRN

## 2024-04-10 MED ORDER — SODIUM CHLORIDE 0.9% FLUSH: 10.0000 mL | INTRAVENOUS | Status: DC | PRN

## 2024-04-10 MED ORDER — SODIUM CHLORIDE 0.9 % IV SOLN: 250.0000 mL | INTRAVENOUS | Status: AC

## 2024-04-10 MED ORDER — METOCLOPRAMIDE HCL 5 MG/ML IJ SOLN
10.0000 mg | Freq: Four times a day (QID) | INTRAMUSCULAR | Status: AC
  Administered 2024-04-10 – 2024-04-11 (×5): 10 mg via INTRAVENOUS
  Filled 2024-04-10 (×5): qty 2

## 2024-04-10 MED ORDER — EPHEDRINE 5 MG/ML INJ
INTRAVENOUS | Status: AC
  Filled 2024-04-10: qty 5

## 2024-04-10 MED ORDER — NOREPINEPHRINE 4 MG/250ML-% IV SOLN
0.0000 ug/min | INTRAVENOUS | Status: DC
  Administered 2024-04-11: 4 ug/min via INTRAVENOUS
  Filled 2024-04-10: qty 250

## 2024-04-10 MED ORDER — ACETAMINOPHEN 500 MG PO TABS
1000.0000 mg | ORAL_TABLET | Freq: Four times a day (QID) | ORAL | Status: DC
  Administered 2024-04-10 – 2024-04-14 (×13): 1000 mg via ORAL
  Filled 2024-04-10 (×15): qty 2

## 2024-04-10 MED ORDER — SODIUM CHLORIDE 0.9% FLUSH
10.0000 mL | Freq: Two times a day (BID) | INTRAVENOUS | Status: DC
  Administered 2024-04-11 – 2024-04-12 (×4): 10 mL

## 2024-04-10 MED ORDER — POTASSIUM CHLORIDE 10 MEQ/50ML IV SOLN
10.0000 meq | INTRAVENOUS | Status: AC
  Administered 2024-04-10 (×3): 10 meq via INTRAVENOUS

## 2024-04-10 MED ORDER — BISACODYL 5 MG PO TBEC
10.0000 mg | DELAYED_RELEASE_TABLET | Freq: Every day | ORAL | Status: DC
  Administered 2024-04-11 – 2024-04-14 (×4): 10 mg via ORAL
  Filled 2024-04-10 (×5): qty 2

## 2024-04-10 MED ORDER — LACTATED RINGERS IV SOLN: INTRAVENOUS | Status: AC

## 2024-04-10 MED ORDER — ROCURONIUM BROMIDE 10 MG/ML (PF) SYRINGE
PREFILLED_SYRINGE | INTRAVENOUS | Status: AC
Start: 2024-04-10 — End: 2024-04-10
  Filled 2024-04-10: qty 10

## 2024-04-10 MED ORDER — VANCOMYCIN HCL IN DEXTROSE 1-5 GM/200ML-% IV SOLN
1000.0000 mg | Freq: Once | INTRAVENOUS | Status: AC
  Administered 2024-04-10: 1000 mg via INTRAVENOUS
  Filled 2024-04-10: qty 200

## 2024-04-10 MED ORDER — OXYCODONE HCL 5 MG PO TABS
5.0000 mg | ORAL_TABLET | ORAL | Status: DC | PRN
  Administered 2024-04-10: 5 mg via ORAL
  Administered 2024-04-11 – 2024-04-12 (×5): 10 mg via ORAL
  Administered 2024-04-13: 5 mg via ORAL
  Filled 2024-04-10: qty 2
  Filled 2024-04-10: qty 1
  Filled 2024-04-10: qty 2
  Filled 2024-04-10: qty 1
  Filled 2024-04-10 (×3): qty 2
  Filled 2024-04-10: qty 1

## 2024-04-10 MED ORDER — HEMOSTATIC AGENTS (NO CHARGE) OPTIME
TOPICAL | Status: DC | PRN
  Administered 2024-04-10 (×2): 1 via TOPICAL

## 2024-04-10 MED ORDER — METOPROLOL TARTRATE 5 MG/5ML IV SOLN: 2.5000 mg | INTRAVENOUS | Status: DC | PRN

## 2024-04-10 MED ORDER — MIDAZOLAM HCL (PF) 10 MG/2ML IJ SOLN
INTRAMUSCULAR | Status: AC
Start: 2024-04-10 — End: 2024-04-10
  Filled 2024-04-10: qty 2

## 2024-04-10 MED ORDER — ASPIRIN 325 MG PO TBEC
325.0000 mg | DELAYED_RELEASE_TABLET | Freq: Every day | ORAL | Status: DC
  Filled 2024-04-10: qty 1

## 2024-04-10 MED ORDER — PROTAMINE SULFATE 10 MG/ML IV SOLN
INTRAVENOUS | Status: AC
  Filled 2024-04-10: qty 25

## 2024-04-10 MED ORDER — INSULIN REGULAR(HUMAN) IN NACL 100-0.9 UT/100ML-% IV SOLN
INTRAVENOUS | Status: DC
  Administered 2024-04-11: 3.4 [IU]/h via INTRAVENOUS
  Filled 2024-04-10: qty 100

## 2024-04-10 MED ORDER — ALBUMIN HUMAN 5 % IV SOLN
250.0000 mL | INTRAVENOUS | Status: DC | PRN
  Administered 2024-04-10 (×4): 12.5 g via INTRAVENOUS
  Filled 2024-04-10 (×2): qty 250

## 2024-04-10 SURGICAL SUPPLY — 76 items
ADAPTER MULTI PERFUSION 15 (ADAPTER) ×3 IMPLANT
BAG DECANTER FOR FLEXI CONT (MISCELLANEOUS) ×3 IMPLANT
BLADE CLIPPER SURG (BLADE) ×3 IMPLANT
BLADE STERNUM SYSTEM 6 (BLADE) ×3 IMPLANT
BNDG ELASTIC 4INX 5YD STR LF (GAUZE/BANDAGES/DRESSINGS) IMPLANT
BNDG ELASTIC 4X5.8 VLCR STR LF (GAUZE/BANDAGES/DRESSINGS) ×3 IMPLANT
BNDG ELASTIC 6INX 5YD STR LF (GAUZE/BANDAGES/DRESSINGS) ×3 IMPLANT
BNDG GAUZE DERMACEA FLUFF 4 (GAUZE/BANDAGES/DRESSINGS) ×3 IMPLANT
CANISTER SUCTION 3000ML PPV (SUCTIONS) ×3 IMPLANT
CANNULA AORTIC ROOT 9FR (CANNULA) ×3 IMPLANT
CANNULA MC2 2 STG 29/37 NON-V (CANNULA) ×3 IMPLANT
CANNULA NON VENT 20FR 12 (CANNULA) ×3 IMPLANT
CATH ROBINSON RED A/P 18FR (CATHETERS) ×6 IMPLANT
CIRCUIT VACPAC SAFETY (MISCELLANEOUS) IMPLANT
CLIP RETRACTION 3.0MM CORONARY (MISCELLANEOUS) IMPLANT
CLIP TI MEDIUM 24 (CLIP) IMPLANT
CLIP TI WIDE RED SMALL 24 (CLIP) IMPLANT
CONNECTOR BLAKE 2:1 CARIO BLK (MISCELLANEOUS) ×3 IMPLANT
CONTAINER PROTECT SURGISLUSH (MISCELLANEOUS) ×6 IMPLANT
DERMABOND ADVANCED .7 DNX12 (GAUZE/BANDAGES/DRESSINGS) IMPLANT
DRAIN CHANNEL 19F RND (DRAIN) ×9 IMPLANT
DRAIN CONNECTOR BLAKE 1:1 (MISCELLANEOUS) IMPLANT
DRAPE INCISE IOBAN 66X45 STRL (DRAPES) IMPLANT
DRAPE SRG 135X102X78XABS (DRAPES) ×3 IMPLANT
DRAPE WARM FLUID 44X44 (DRAPES) ×3 IMPLANT
DRSG AQUACEL AG ADV 3.5X10 (GAUZE/BANDAGES/DRESSINGS) ×3 IMPLANT
ELECTRODE BLDE 4.0 EZ CLN MEGD (MISCELLANEOUS) ×3 IMPLANT
ELECTRODE REM PT RTRN 9FT ADLT (ELECTROSURGICAL) ×6 IMPLANT
FELT TEFLON 1X6 (MISCELLANEOUS) ×6 IMPLANT
GAUZE SPONGE 4X4 12PLY STRL (GAUZE/BANDAGES/DRESSINGS) ×6 IMPLANT
GLOVE BIO SURGEON STRL SZ7 (GLOVE) ×6 IMPLANT
GLOVE BIOGEL M STRL SZ7.5 (GLOVE) ×6 IMPLANT
GOWN STRL REUS W/ TWL LRG LVL3 (GOWN DISPOSABLE) ×12 IMPLANT
GOWN STRL REUS W/ TWL XL LVL3 (GOWN DISPOSABLE) ×6 IMPLANT
HEMOSTAT POWDER SURGIFOAM 1G (HEMOSTASIS) ×6 IMPLANT
HEMOSTAT SURGICEL 2X14 (HEMOSTASIS) IMPLANT
INSERT SUTURE HOLDER (MISCELLANEOUS) ×3 IMPLANT
KIT BASIN OR (CUSTOM PROCEDURE TRAY) ×3 IMPLANT
KIT TURNOVER KIT B (KITS) ×3 IMPLANT
KIT VASOVIEW HEMOPRO 2 VH 4000 (KITS) ×3 IMPLANT
LEAD PACING MYOCARDI (MISCELLANEOUS) ×3 IMPLANT
MARKER DISTAL GRAFT W/ HOLDER (MISCELLANEOUS) ×9 IMPLANT
NS IRRIG 1000ML POUR BTL (IV SOLUTION) ×15 IMPLANT
PACK E OPEN HEART (SUTURE) ×3 IMPLANT
PACK OPEN HEART (CUSTOM PROCEDURE TRAY) ×3 IMPLANT
PAD ARMBOARD POSITIONER FOAM (MISCELLANEOUS) ×6 IMPLANT
PAD ELECT DEFIB RADIOL ZOLL (MISCELLANEOUS) ×3 IMPLANT
PENCIL BUTTON HOLSTER BLD 10FT (ELECTRODE) ×3 IMPLANT
POSITIONER HEAD DONUT 9IN (MISCELLANEOUS) ×3 IMPLANT
PUNCH AORTIC ROTATE 4.0MM (MISCELLANEOUS) ×3 IMPLANT
SEALANT HEMOST PREVELEAK 4ML (HEMOSTASIS) IMPLANT
SET MPS 3-ND DEL (MISCELLANEOUS) IMPLANT
SOLUTION ANTFG W/FOAM PAD STRL (MISCELLANEOUS) IMPLANT
SUPPORT HEART JANKE-BARRON (MISCELLANEOUS) ×3 IMPLANT
SUT ETHIBOND X763 2 0 SH 1 (SUTURE) ×6 IMPLANT
SUT MNCRL AB 3-0 PS2 18 (SUTURE) ×6 IMPLANT
SUT MNCRL AB 4-0 PS2 18 (SUTURE) IMPLANT
SUT PDS AB 1 CTX 36 (SUTURE) ×6 IMPLANT
SUT PROLENE 4 0 SH DA (SUTURE) ×3 IMPLANT
SUT PROLENE 5 0 C 1 36 (SUTURE) ×9 IMPLANT
SUT PROLENE 7 0 BV 1 (SUTURE) IMPLANT
SUT PROLENE 7 0 BV1 MDA (SUTURE) ×3 IMPLANT
SUT STEEL 6MS V (SUTURE) ×6 IMPLANT
SUT STEEL STERNAL CCS#1 18IN (SUTURE) IMPLANT
SUT VIC AB 2-0 CT1 TAPERPNT 27 (SUTURE) IMPLANT
SYSTEM SAHARA CHEST DRAIN ATS (WOUND CARE) ×3 IMPLANT
TAPE CLOTH SURG 4X10 WHT LF (GAUZE/BANDAGES/DRESSINGS) IMPLANT
TAPE PAPER 2X10 WHT MICROPORE (GAUZE/BANDAGES/DRESSINGS) IMPLANT
TOWEL GREEN STERILE (TOWEL DISPOSABLE) ×3 IMPLANT
TOWEL GREEN STERILE FF (TOWEL DISPOSABLE) ×3 IMPLANT
TRAY FOLEY SLVR 16FR TEMP STAT (SET/KITS/TRAYS/PACK) ×3 IMPLANT
TUBE SUCT INTRACARD DLP 20F (MISCELLANEOUS) ×3 IMPLANT
TUBE SUCTION CARDIAC 10FR (CANNULA) ×3 IMPLANT
TUBING LAP HI FLOW INSUFFLATIO (TUBING) ×3 IMPLANT
UNDERPAD 30X36 HEAVY ABSORB (UNDERPADS AND DIAPERS) ×3 IMPLANT
WATER STERILE IRR 1000ML POUR (IV SOLUTION) ×6 IMPLANT

## 2024-04-10 NOTE — Hospital Course (Addendum)
 History of Present Illness:     Mr. Lot Medford. Derflinger is a 63 year old gentleman with a past history notable for hypertension, dyslipidemia, type 2 diabetes mellitus (hemoglobin A1c 9.5), CKD stage IIIb, spinal stenosis with cardiomyopathy, anxiety with depression, and who is a former 4-pack-year cigarette smoker.  He has class II obesity with a BMI of 38.  Mr. Miano has known coronary artery disease having undergone PCI to the LAD in 2004.  In January of this year, presented with an acute ST elevation myocardial infarction and subsequently underwent left heart catheterization with urgent PCI with a drug-eluting stent to the the firs diagonal coronary artery (previously labeled ramus intermediate coronary artery on the cath report).  At the time, left heart catheterization demonstrated diffuse multivessel coronary artery disease not felt to be amenable to further PCI.  He and his cardiologist decided to continue with medical management rather pursue referral for CABG due to potential for perioperative worsening of his stage III CKD.  He has been maintained on dual antiplatelet therapy with aspirin  81 mg and Brilinta  along with rosuvastatin , Zetia , lisinopril , and metoprolol .  He was seen in follow-up by Dr. Arnetha 03/29/2024 with complaints of chest pain over the course of the previous 2 weeks.  The pain was not relieved with Pepto-Bismol or his GERD medications but was relieved with nitroglycerin .  He also reported hypertension during these episodes with his blood pressure as high as 202/102.  Evaluation with left heart catheterization w and in-stent stenoses as recommended.  He had elective heart catheterization yesterday showing high-grade in-stent stenoses in both the diagonal and LAD. In addition, there is diffuse mid and distal LAD disease with 3 lesions ranging from 60 to 80%.  The 1st and 2nd obtuse marginals have 75 and 80% stenoses respectively.  The posterior descending coronary artery is diffusely  diseased with 70 to 90% tandem lesions.  The most recent echocardiogram was done in April of this year showing 60 to 65% left ventricular ejection fraction.  RV function was normal.  There were no significant valvular lesions present the ascending aorta was estimated at 43 mm in diameter by echo.  Follow-up CTA chest showed the ascending aorta to measure 4.0 cm which was stable compared with CTA in December 2022.  There was no evidence of dissection. Mr. Sorg is referred for consideration of coronary bypass grafting in the setting of multivessel coronary artery disease with high-grade in-stent stenoses of the LAD and diagonal with progressive angina.   Mr. Crum is sitting in the bedside chair and denies having any chest pain in the past 2 days.  He is married and lives with his wife who is in poor health.  He has 2 sons who live locally who would be willing to assist him with postoperative rehab.  Mr. Gautier has retired on disability after a fall at work in 2022 resulting in multiple rib fractures.  He also has chronic back and neck pain but is able to move about unassisted.  He continues to do his own yard work.  He is right-handed.  He has not seen a dentist in several years but has no current dental issues he is concerned about.    Last dose of Brilinta  was 04/02/2024.  Dr. Shyrl reviewed the patient's diagnostic studies and determined he would benefit from surgical intervention. He reviewed the patient's treatment options as well as the risks and benefits of surgery. Mr. Cuttino was agreeable to proceed with surgery.  Hospital Course: Mr. Bautch remained  inpatient at [facility] and remained stable. He was brought to the operating room on 04/10/24 and underwent CABG x 3 utilizing LIMA to LAD, SVG to PDA, and SVG to Diagonal as well as endarterectomy of the LAD and endoscopic harvest of the right greater saphenous vein. He tolerated the surgery well and was transferred to the SICU in stable condition. He  was extubated the night of surgery without complication. He required Levophed  for hemodynamic support, drips were weaned as hemodynamics tolerated. Swan ganz catheter and arterial line were removed without complication. Plavix  was started for endarterectomy. His epicardial pacing wires and chest tubes were removed without complication. He was started on low dose Lopressor . He developed expected acute postoperative blood loss anemia and was transfused with 1U PRBCs and started on an iron supplement. He was routinely diuresed. He was felt stable for transfer to the progressive unit. He developed sinus tachycardia, Lopressor  was titrated and this improved. He was still edematous on exam but he was under his preoperative weight and creatinine remained stable, Dr. Shyrl recommended to not discharge the patient home with Lasix . He was ambulating well on room air. His bowels were moving appropriately. His incisions were healing well without sign of infection. He was felt stable for discharge home.

## 2024-04-10 NOTE — Anesthesia Postprocedure Evaluation (Signed)
 Anesthesia Post Note  Patient: Asim Gersten Marineau  Procedure(s) Performed: CORONARY ARTERY BYPASS GRAFTING (CABG) TIMES THREE USING LEFT INTERNAL MAMMARY ARTERY AND ENDOSCOPICALLY HARVESTED RIGHT GREATER SAPHENOUS VEIN (Chest) ECHOCARDIOGRAM, TRANSESOPHAGEAL, INTRAOPERATIVE     Patient location during evaluation: SICU Anesthesia Type: General Level of consciousness: sedated Pain management: pain level controlled Vital Signs Assessment: post-procedure vital signs reviewed and stable Respiratory status: patient remains intubated per anesthesia plan Cardiovascular status: stable Postop Assessment: no apparent nausea or vomiting Anesthetic complications: no   No notable events documented.  Last Vitals:  Vitals:   04/10/24 0832 04/10/24 1430  BP:  (!) 111/52  Pulse: 62 86  Resp: 13 16  Temp:    SpO2: 98% 100%    Last Pain:  Vitals:   04/10/24 0713  TempSrc:   PainSc: 6                  Andrianna Manalang,W. EDMOND

## 2024-04-10 NOTE — Anesthesia Procedure Notes (Signed)
 Arterial Line Insertion Start/End8/02/2024 7:50 AM, 04/10/2024 7:59 AM Performed by: Epifanio Fallow, MD, Zelphia Norleen HERO, CRNA, CRNA  Patient location: Pre-op. Preanesthetic checklist: patient identified, IV checked, site marked, risks and benefits discussed, surgical consent, monitors and equipment checked, pre-op evaluation, timeout performed and anesthesia consent Lidocaine  1% used for infiltration Left, radial was placed Catheter size: 20 G  Attempts: 2 Procedure performed using ultrasound guided technique. Ultrasound Notes:anatomy identified, needle tip was noted to be adjacent to the nerve/plexus identified and no ultrasound evidence of intravascular and/or intraneural injection Following insertion, dressing applied and Biopatch. Post procedure assessment: normal  Patient tolerated the procedure well with no immediate complications.

## 2024-04-10 NOTE — Anesthesia Preprocedure Evaluation (Addendum)
 Anesthesia Evaluation  Patient identified by MRN, date of birth, ID band Patient awake    Reviewed: Allergy & Precautions, H&P , NPO status , Patient's Chart, lab work & pertinent test results, reviewed documented beta blocker date and time   Airway Mallampati: III  TM Distance: >3 FB Neck ROM: Full    Dental no notable dental hx. (+) Teeth Intact, Dental Advisory Given   Pulmonary former smoker   Pulmonary exam normal breath sounds clear to auscultation       Cardiovascular hypertension, Pt. on medications and Pt. on home beta blockers + CAD, + Past MI and + Cardiac Stents  + dysrhythmias  Rhythm:Regular Rate:Normal     Neuro/Psych   Anxiety Depression    negative neurological ROS     GI/Hepatic Neg liver ROS, PUD,GERD  Medicated,,  Endo/Other  diabetes, Type 2, Oral Hypoglycemic Agents  Class 3 obesity  Renal/GU Renal InsufficiencyRenal diseasenegative Renal ROS  negative genitourinary   Musculoskeletal  (+) Arthritis , Osteoarthritis,    Abdominal   Peds  Hematology negative hematology ROS (+)   Anesthesia Other Findings   Reproductive/Obstetrics negative OB ROS                              Anesthesia Physical Anesthesia Plan  ASA: 4  Anesthesia Plan: General   Post-op Pain Management: Tylenol  PO (pre-op)*   Induction: Intravenous  PONV Risk Score and Plan: 3 and Ondansetron , Dexamethasone  and Midazolam   Airway Management Planned: Oral ETT  Additional Equipment: Arterial line, CVP, TEE and Ultrasound Guidance Line Placement  Intra-op Plan:   Post-operative Plan: Post-operative intubation/ventilation  Informed Consent: I have reviewed the patients History and Physical, chart, labs and discussed the procedure including the risks, benefits and alternatives for the proposed anesthesia with the patient or authorized representative who has indicated his/her understanding and  acceptance.     Dental advisory given  Plan Discussed with: CRNA  Anesthesia Plan Comments:          Anesthesia Quick Evaluation

## 2024-04-10 NOTE — Procedures (Signed)
 Extubation Procedure Note  Patient Details:   Name: Devon Wood DOB: 03-Jan-1961 MRN: [id number]   Airway Documentation:  Airway 8 mm (Active)  Secured at (cm) 22 cm 04/10/24 2144  Measured From Lips 04/10/24 2144  Secured Location Right 04/10/24 2144  Secured By [employer] Tape 04/10/24 2144  Bite Block No 04/10/24 2144  Prone position No 04/10/24 2144  Cuff Pressure (cm H2O) MOV (Manual Technique) 04/10/24 2144  Site Condition Cool;Dry 04/10/24 2144   Vent end date: (not recorded) Vent end time: (not recorded)   Evaluation  O2 sats: stable throughout Complications: No apparent complications Patient did tolerate procedure well. Bilateral Breath Sounds: Diminished   Yes  [name], [name]   [name] 04/10/2024, 10:16 PM

## 2024-04-10 NOTE — Interval H&P Note (Signed)
 History and Physical Interval Note:  04/10/2024 8:29 AM  Devon Wood  has presented today for surgery, with the diagnosis of CAD.  The various methods of treatment have been discussed with the patient and family. After consideration of risks, benefits and other options for treatment, the patient has consented to  Procedure(s): CORONARY ARTERY BYPASS GRAFTING (CABG) (N/A) ECHOCARDIOGRAM, TRANSESOPHAGEAL, INTRAOPERATIVE (N/A) as a surgical intervention.  The patient's history has been reviewed, patient examined, no change in status, stable for surgery.  I have reviewed the patient's chart and labs.  Questions were answered to the patient's satisfaction.     Emily Forse MALVA Rayas

## 2024-04-10 NOTE — Transfer of Care (Signed)
 Immediate Anesthesia Transfer of Care Note  Patient: Devon Wood  Procedure(s) Performed: CORONARY ARTERY BYPASS GRAFTING (CABG) TIMES THREE USING LEFT INTERNAL MAMMARY ARTERY AND ENDOSCOPICALLY HARVESTED RIGHT GREATER SAPHENOUS VEIN (Chest) ECHOCARDIOGRAM, TRANSESOPHAGEAL, INTRAOPERATIVE  Patient Location: SICU  Anesthesia Type:General  Level of Consciousness: sedated and Patient remains intubated per anesthesia plan  Airway & Oxygen Therapy: Patient remains intubated per anesthesia plan and Patient placed on Ventilator (see vital sign flow sheet for setting)  Post-op Assessment: Report given to RN and Post -op Vital signs reviewed and stable  Post vital signs: Reviewed and stable  Last Vitals:  Vitals Value Taken Time  BP 111/52 04/10/24 14:30  Temp    Pulse 89 04/10/24 14:40  Resp 14 04/10/24 14:40  SpO2 100 % 04/10/24 14:40  Vitals shown include unfiled device data.  Last Pain:  Vitals:   04/10/24 0713  TempSrc:   PainSc: 6          Complications: No notable events documented.

## 2024-04-10 NOTE — Discharge Instructions (Signed)

## 2024-04-10 NOTE — Progress Notes (Signed)
      [address]       [place] [address]             [phone]      S/p CABG x 3  Intubated, weaning in progress  BP 136/81   Pulse 76   Temp (!) 97.2 F (36.2 C) (Bladder)   Resp 17   Ht 5' 6 (1.676 m)   Wt 106.6 kg   SpO2 100%   BMI 37.93 kg/m   CVP 5, CI 2.8 by Flow track   Intake/Output Summary (Last 24 hours) at 04/10/2024 1822 Last data filed at 04/10/2024 1806 Gross per 24 hour  Intake 4687.18 ml  Output 3963 ml  Net 724.18 ml   Minimal CT output  K 3.9 Hct 24  Doing well early postop  Elspeth C. Kerrin, MD [facility] [phone]

## 2024-04-10 NOTE — Consult Note (Signed)
 NAME:  Devon Wood, MRN:  [id number], DOB:  07-02-1961, LOS: 0 ADMISSION DATE:  04/10/2024, CONSULTATION DATE:  04/10/24 REFERRING MD:  Shyrl- TCTS, CHIEF COMPLAINT:  post-CABG   History of Present Illness:  63 year old male patient with known history of coronary artery disease, hypertension and CKD stage III.  Had previous stent back in January 2025, at which time multivessel coronary artery disease.  He presented to cardiology office on 03/29/24 for evaluation of new chest pain.  He had been treated in the outpatient setting with Brilinta , aspirin , Zetia , lovastatin and metoprolol . He underwent cardiac catheterization on 7/30, demonstrating  multivessel coronary artery disease including restenosis of the proximal mid LAD stents and significant disease involving the circumflex and the RCA.  He was seen by cardiothoracic surgery and was deemed candidate for three-vessel coronary artery bypass grafting. He was transferred to the ICU intubated on neosynephrine.  Paralytics not reversed in OR.   Presented to [facility] for elective CABG on 8/6 Cross clamp: 71 min Bypass time: 112 min EBL:  900cc Cell saver: 462 cc  Pertinent  Medical History  Cardiac artery disease involving the LAD previous stent, circumflex and RCA.  EF 65 to 70%, no regional wall abnormality, hypertension, mixed hyperlipidemia, type 2 diabetes CKD stage III PUD HTN  Significant Hospital Events: Including procedures, antibiotic start and stop dates in addition to other pertinent events   8/6 presented for planned three-vessel CABG , transferred to ICU on neosynephrine and intubated.  Interim History / Subjective:    Objective    Blood pressure 121/68, pulse 62, temperature 98.5 F (36.9 C), temperature source Oral, resp. rate 13, height 5' 6 (1.676 m), weight 106.6 kg, SpO2 98%.        Intake/Output Summary (Last 24 hours) at 04/10/2024 1151 Last data filed at 04/10/2024 1132 Gross per 24 hour  Intake 2100 ml  Output 675  ml  Net 1425 ml   Filed Weights   04/10/24 0651  Weight: 106.6 kg    Examination: General: critically ill appearing man lying in bed intubated, sedated HENT: [place]/AT, eyes anicteric Lungs: breathing comfortably on MV, CTAB Cardiovascular: S1S2, RRR, minimal bloody output from chestt ubes Abdomen: soft, NT Extremities: RLE compressive dressing, no edema Neuro: RASS -5 GU: clear yellow urine  CXR personally reviewed> ETT ~4 cm above carina.  EKG NSR with 1st degree AVB, LAF block, prolonged QTc  7.36/36/129/21   Resolved problem list   Assessment and Plan  Severe three-vessel coronary artery disease, now status post CABG x 3, with known history of coronary artery disease hypertension and hyperlipidemia Plan Continue postop telemetry monitoring Ensure euvolemia Titrate pressors for mean arterial pressure greater than 65; goal SBP <120-140 Inotropic support for cardiac index less than 2 Follow-up postoperative lab including: CBC, ABG and blood chemistry Close observation of chest tubes for evidence of increased postoperative bleeding Multimodal pain control per protocol- morphine , oxy, tramadol  Avoiding fever Postoperative statin, aspirin  Postoperative beta-blockade starting tomorrow Complete prophylactic antibiotics  Postoperative ventilator management Plan Follow-up chest x-ray and ABG VAP bundle Rapid wean once normothermic and hemodynamics support  CKD stage IIIb; high risk for AKI; Baseline serum creatinine 2.3-2.6 plan Close observation of postoperative chemistries Maintain adequate perfusion Renally dose medications Strict intake & output  Fluid and electrolyte imbalance: Hyperkalemia intraoperatively Plan Follow-up BMP  Prolonged Qtc -monitor on tele -avoid QT prolonging meds -monitor electrolytes and replete as needed  Expected postoperative acute blood loss anemia Baseline hemoglobin 12.2 Plan Trend postoperative CBCs Transfuse  for hemoglobin  less than 7 or hemodynamically significant bleeding  Type 2 diabetes Plan Insulin  gtt per protocol Goal BG <180 Hold PTA jardiance , sitagliptin, metformin   HLD Statin Hold PTA zetia   Best Practice (right click and Reselect all SmartList Selections daily)   Diet/type: NPO DVT prophylaxis SCD Pressure ulcer(s): N/A GI prophylaxis: H2B and PPI Lines: Central line, Arterial Line, and yes and it is still needed Foley:  Yes, and it is still needed Code Status:  full code Last date of multidisciplinary goals of care discussion [ ]   Labs   CBC: Recent Labs  Lab 04/03/24 1859 04/04/24 0858 04/08/24 1000 04/10/24 0901 04/10/24 1103  WBC 7.6 7.5 8.2  --   --   HGB 12.0* 12.4* 12.2* 10.9* 9.9*  HCT 36.7* 37.3* 37.6* 32.0* 29.0*  MCV 86.8 87.1 88.7  --   --   PLT 205 210 231  --   --     Basic Metabolic Panel: Recent Labs  Lab 04/03/24 1605 04/03/24 1853 04/04/24 0858 04/08/24 1000 04/10/24 0901 04/10/24 1103  NA 133* 135 135 134* 135 135  K 4.4 4.4 4.5 4.8 5.3* 4.6  CL 105 104 108 103 104 105  CO2 21* 22 20* 22  --   --   GLUCOSE 164* 136* 176* 196* 191* 164*  BUN 43* 41* 38* 38* 38* 37*  CREATININE 2.41* 2.46* 2.62* 2.32* 2.20* 2.00*  CALCIUM  8.7* 8.9 8.8* 8.9  --   --    GFR: Estimated Creatinine Clearance: 44.4 mL/min (A) (by C-G formula based on SCr of 2 mg/dL (H)). Recent Labs  Lab 04/03/24 1859 04/04/24 0858 04/08/24 1000  WBC 7.6 7.5 8.2    Liver Function Tests: Recent Labs  Lab 04/08/24 1000  AST 28  ALT 26  ALKPHOS 51  BILITOT 0.7  PROT 7.7  ALBUMIN  4.1   No results for input(s): LIPASE, AMYLASE in the last 168 hours. No results for input(s): AMMONIA in the last 168 hours.  ABG    Component Value Date/Time   TCO2 21 (L) 04/10/2024 1103     Coagulation Profile: No results for input(s): INR, PROTIME in the last 168 hours.  Cardiac Enzymes: No results for input(s): CKTOTAL, CKMB, CKMBINDEX, TROPONINI in the last  168 hours.  HbA1C: Hgb A1c MFr Bld  Date/Time Value Ref Range Status  04/08/2024 09:42 AM 9.1 (H) 4.8 - 5.6 % Final    Comment:    (NOTE)         Prediabetes: 5.7 - 6.4         Diabetes: >6.4         Glycemic control for adults with diabetes: <7.0   04/03/2024 06:53 PM 9.2 (H) 4.8 - 5.6 % Final    Comment:    (NOTE) Diagnosis of Diabetes The following HbA1c ranges recommended by the American Diabetes Association (ADA) may be used as an aid in the diagnosis of diabetes mellitus.  Hemoglobin             Suggested A1C NGSP%              Diagnosis  <5.7                   Non Diabetic  5.7-6.4                Pre-Diabetic  >6.4                   Diabetic  <7.0  Glycemic control for                       adults with diabetes.      CBG: Recent Labs  Lab 04/04/24 0749 04/04/24 1153 04/04/24 1635 04/08/24 0903 04/10/24 0654  GLUCAP 177* 203* 142* 169* 194*    Review of Systems:   Unable to be obtained due to intubated/ sedated  Past Medical History:  He,  has a past medical history of Anginal pain (HCC), Anxiety (08/02/2012), Arthritis (04/12/2012), Bronchitis, Bronchitis, CAD (coronary artery disease) (10/10/2013), Chronic back pain (09/24/2003), CKD (chronic kidney disease), stage III (HCC) (09/07/2023), Complication of anesthesia, Coronary artery disease (2004), Depression (2006), Diabetes mellitus (2005), DM type 2 causing complication (HCC) (10/10/2013), First degree heart block (09/06/2023), GERD (gastroesophageal reflux disease) (04/12/2012), Gout (02/21/2007), History of kidney stones, HTN (hypertension) (2005), Hypercholesteremia (04/13/2012), Hypertension, Joint pain, Mixed hyperlipidemia (10/10/2013), Morbid obesity (HCC) (10/10/2013), Myocardial infarction (HCC) (09/06/2023), Nocturia, and Peptic ulcer.   Surgical History:   Past Surgical History:  Procedure Laterality Date   ANTERIOR CERVICAL DECOMP/DISCECTOMY FUSION N/A 07/28/2021    Procedure: Anterior Cervical Decompression Fusion Cervical four-five,Cervical five-six;  Surgeon: Onetha Kuba, MD;  Location: [facility] OR;  Service: Neurosurgery;  Laterality: N/A;   BACK SURGERY     CARDIAC CATHETERIZATION  11/27/2009   widely patent stent   COLONOSCOPY WITH PROPOFOL  N/A 03/17/2015   Procedure: COLONOSCOPY WITH PROPOFOL ;  Surgeon: Gladis MARLA Louder, MD;  Location: [facility];  Service: Endoscopy;  Laterality: N/A;   CORONARY STENT PLACEMENT  05/30/2003   proximal LAD   CORONARY/GRAFT ACUTE MI REVASCULARIZATION N/A 09/06/2023   Procedure: Coronary/Graft Acute MI Revascularization;  Surgeon: Elmira Newman PARAS, MD;  Location: [facility] INVASIVE CV LAB;  Service: Cardiovascular;  Laterality: N/A;   ESOPHAGOGASTRODUODENOSCOPY  in the 90's   HAND SURGERY  1980   right   LEFT HEART CATH AND CORONARY ANGIOGRAPHY N/A 09/06/2023   Procedure: LEFT HEART CATH AND CORONARY ANGIOGRAPHY;  Surgeon: Elmira Newman PARAS, MD;  Location: [facility] INVASIVE CV LAB;  Service: Cardiovascular;  Laterality: N/A;   LEFT HEART CATH AND CORONARY ANGIOGRAPHY N/A 04/03/2024   Procedure: LEFT HEART CATH AND CORONARY ANGIOGRAPHY;  Surgeon: Elmira Newman PARAS, MD;  Location: [facility] INVASIVE CV LAB;  Service: Cardiovascular;  Laterality: N/A;   LUMBAR DISC SURGERY  2002   L5   LUMBAR FUSION  2004   NM MYOCAR PERF WALL MOTION  05/19/2004   no evidence of ischemia   POSTERIOR CERVICAL FUSION/FORAMINOTOMY N/A 08/11/2021   Procedure: Post Cervical Lami/Multi Level - Cervical four-Cervical five - Cervical five-Cervical six with lateral mass screw fixation;  Surgeon: Onetha Kuba, MD;  Location: [facility] OR;  Service: Neurosurgery;  Laterality: N/A;   TONSILLECTOMY  1969   US  ECHOCARDIOGRAPHY  04/30/2003   mildly dilated aortic root,mild LA enlargement,mild concentric LVH,trivial mitral and tricuspid regurg.,mild mitral annular ca+     Social History:   reports that he quit smoking about 41 years ago. His smoking use included cigarettes. He  started smoking about 45 years ago. He has a 2 pack-year smoking history. He quit smokeless tobacco use about 35 years ago.  His smokeless tobacco use included chew. He reports that he does not currently use alcohol. He reports that he does not use drugs.   Family History:  His family history includes COPD in his brother, brother, and mother; Cancer in his maternal grandfather; Dementia in his maternal grandmother; Heart attack in his father and paternal grandfather;  Heart failure in his paternal grandmother; Hypertension in his sister. There is no history of Anesthesia problems, Hypotension, Malignant hyperthermia, or Pseudochol deficiency.   Allergies Allergies  Allergen Reactions   Erythromycin Rash     Home Medications  Prior to Admission medications   Medication Sig Start Date End Date Taking? Authorizing Provider  aspirin  81 MG tablet Take 81 mg by mouth daily.   Yes [provider]  co-enzyme Q-10 30 MG capsule Take 30 mg by mouth 3 (three) times daily.   Yes [provider]  escitalopram  (LEXAPRO ) 10 MG tablet Take 510 mg by mouth daily. 10/07/13  Yes [provider]  ezetimibe  (ZETIA ) 10 MG tablet Take 1 tablet (10 mg total) by mouth daily. 10/10/23  Yes Croitoru, Mihai, MD  famotidine  (PEPCID ) 20 MG tablet Take 20 mg by mouth daily.    Yes [provider]  meloxicam  (MOBIC ) 15 MG tablet Take 15 mg by mouth daily.   Yes [provider]  metFORMIN  (GLUCOPHAGE ) 1000 MG tablet Take 1,000 mg by mouth 2 (two) times daily with a meal.   Yes [provider]  metoprolol  succinate (TOPROL -XL) 50 MG 24 hr tablet Take 1 tablet (50 mg total) by mouth daily. 10/10/23  Yes Croitoru, Mihai, MD  rosuvastatin  (CRESTOR ) 20 MG tablet Take 1 tablet (20 mg total) by mouth daily. 10/10/23  Yes Croitoru, Mihai, MD  empagliflozin  (JARDIANCE ) 10 MG TABS tablet Take 10 mg by mouth daily.    [provider]  fluticasone  (FLONASE ) 50 MCG/ACT nasal spray  Place 2 sprays into both nostrils daily as needed for allergies.    [provider]  nitroGLYCERIN  (NITROSTAT ) 0.4 MG SL tablet Place 1 tablet (0.4 mg total) under the tongue every 5 (five) minutes as needed. 10/10/23   Croitoru, Mihai, MD  sitaGLIPtin (JANUVIA) 100 MG tablet Take 50 mg by mouth daily.    [provider]     Critical care time: 40 min.     Leita SHAUNNA Gaskins, DO 04/10/24 3:10 PM [facility]  For contact information, see Amion. If no response to pager, please call PCCM consult pager. After hours, 7PM- 7AM, please call Elink.

## 2024-04-10 NOTE — Brief Op Note (Signed)
 04/10/2024  3:24 PM  PATIENT:  Devon Wood  63 y.o. male  PRE-OPERATIVE DIAGNOSIS:  CORONARY ARTERY DISEASE  POST-OPERATIVE DIAGNOSIS:  CORONARY ARTERY DISEASE  PROCEDURE:   CORONARY ARTERY BYPASS GRAFTING (CABG) TIMES THREE USING LEFT INTERNAL MAMMARY ARTERY AND ENDOSCOPICALLY HARVESTED RIGHT GREATER SAPHENOUS VEIN  ECHOCARDIOGRAM, TRANSESOPHAGEAL, INTRAOPERATIVE  -LIMA to LAD -SVG to PDA -SVG to Diagonal Vein harvest time: Vein prep time:  SURGEON:  Surgeons and Role:    * Shyrl Linnie KIDD, MD - Primary  PHYSICIAN ASSISTANT: Con Bend PA-C, Lamar Bers PA-S  ASSISTANTS: Devon Domino RNFA   ANESTHESIA:   general  EBL:  900 mL   BLOOD ADMINISTERED:none  DRAINS: Mediastinal and pleural drains   LOCAL MEDICATIONS USED:  NONE  SPECIMEN:  No Specimen  DISPOSITION OF SPECIMEN:  N/A  COUNTS:  YES  TOURNIQUET:  * No tourniquets in log *  DICTATION: .Dragon Dictation  PLAN OF CARE: Admit to inpatient   PATIENT DISPOSITION:  ICU - intubated and hemodynamically stable.   Delay start of Pharmacological VTE agent (>24hrs) due to surgical blood loss or risk of bleeding: yes

## 2024-04-10 NOTE — TOC Initial Note (Signed)
 Transition of Care [facility]) - Initial/Assessment Note    Patient Details  Name: Devon Wood MRN: [id number] Date of Birth: [birth year]  [facility] CM/SW Contact:    Sudie Erminio Deems, RN Phone Number: 04/10/2024, 3:35 PM  Clinical Narrative:  Patient presented for CABG x 3.  Patient was recently hospitalized on 04-04-24. PTA patient was independent from home with spouse. Patient does not use any DME in the home. Patient has PCP/insurance. Patient has transportation to all appointments. Case Manager was notified by [facility] liaison that they are following the patient with TCTS office protocol referral prearranged for [facility] needs if needed. Unit Case Manager will continue to follow for additional disposition needs.   Expected Discharge Plan: Home w Home Health Services Barriers to Discharge: Continued Medical Work up   Patient Goals and CMS Choice     Choice offered to / list presented to : NA      Expected Discharge Plan and Services In-house Referral: NA Discharge Planning Services: CM Consult Post Acute Care Choice: Home Health Living arrangements for the past 2 months: Single Family Home   Prior Living Arrangements/Services Living arrangements for the past 2 months: Single Family Home   Patient language and need for interpreter reviewed:: Yes Do you feel safe going back to the place where you live?: Yes      Need for Family Participation in Patient Care: Yes (Comment) Care giver support system in place?: Yes (comment)   Criminal Activity/Legal Involvement Pertinent to Current Situation/Hospitalization: No - Comment as needed  Permission Sought/Granted Permission sought to share information with : Family Supports, Case Manager   Emotional Assessment Appearance:: Appears stated age         Psych Involvement: No (comment)  Admission diagnosis:  Coronary artery disease involving native coronary artery of native heart without angina pectoris  [I25.10] S/P CABG x 3 [Z95.1] Patient Active Problem List   Diagnosis Date Noted   S/P CABG x 3 04/10/2024   Chest pain 04/04/2024   CKD (chronic kidney disease), stage III (HCC) 09/07/2023   STEMI (ST elevation myocardial infarction) (HCC) 09/06/2023   Myelopathy concurrent with and due to spinal stenosis of cervical region (HCC) 08/11/2021   Myelopathy (HCC) 07/28/2021   Painful orthopaedic hardware (HCC) 10/24/2018   Dyslipidemia (high LDL; low HDL) 09/29/2018   Spinal stenosis 04/05/2017   Severe obesity (BMI 35.0-35.9 with comorbidity) (HCC) 04/04/2017   CAD (coronary artery disease) 10/10/2013   Mixed hyperlipidemia 10/10/2013   DM type 2 causing complication (HCC) 10/10/2013   Essential hypertension 10/10/2013   Morbid obesity (HCC) 10/10/2013   Lumbar spinal stenosis 10/10/2013   PCP:  Ransom Other, MD Pharmacy:   [facility] [phone] [address], [place] - [address] [place] [address] Phone: [phone] Fax: [phone]   Social Drivers of Health (SDOH) Social History: SDOH Screenings   Food Insecurity: No Food Insecurity (04/03/2024)  Housing: Low Risk  (04/03/2024)  Transportation Needs: No Transportation Needs (04/03/2024)  Utilities: Not At Risk (04/03/2024)  Financial Resource Strain: Low Risk  (09/13/2023)  Social Connections: Socially Integrated (09/06/2023)  Tobacco Use: Medium Risk (04/10/2024)  Health Literacy: Adequate Health Literacy (09/13/2023)    Readmission Risk Interventions     No data to display

## 2024-04-10 NOTE — Anesthesia Procedure Notes (Signed)
 Procedure Name: Intubation Date/Time: 04/10/2024 8:56 AM  Performed by: Elby Raelene SAUNDERS, CRNAPre-anesthesia Checklist: Patient identified, Emergency Drugs available, Suction available and Patient being monitored Patient Re-evaluated:Patient Re-evaluated prior to induction Oxygen Delivery Method: Circle System Utilized Preoxygenation: Pre-oxygenation with 100% oxygen Induction Type: IV induction Ventilation: Two handed mask ventilation required and Oral airway inserted - appropriate to patient size Laryngoscope Size: Cleotilde and 2 Grade View: Grade II Tube type: Oral Tube size: 8.0 mm Number of attempts: 1 Airway Equipment and Method: Stylet and Oral airway Placement Confirmation: ETT inserted through vocal cords under direct vision, positive ETCO2 and breath sounds checked- equal and bilateral Secured at: 23 cm Tube secured with: Tape Dental Injury: Teeth and Oropharynx as per pre-operative assessment

## 2024-04-10 NOTE — Anesthesia Procedure Notes (Signed)
 Central Venous Catheter Insertion Performed by: Epifanio Fallow, MD, anesthesiologist Start/End8/02/2024 7:45 AM, 04/10/2024 8:05 AM Patient location: Pre-op. Preanesthetic checklist: patient identified, IV checked, site marked, risks and benefits discussed, surgical consent, monitors and equipment checked, pre-op evaluation, timeout performed and anesthesia consent Position: Trendelenburg Lidocaine  1% used for infiltration and patient sedated Hand hygiene performed , maximum sterile barriers used  and Seldinger technique used Catheter size: 9 Fr Total catheter length 10. Central line was placed.MAC introducer Procedure performed using ultrasound guided technique. Ultrasound Notes:anatomy identified, needle tip was noted to be adjacent to the nerve/plexus identified, no ultrasound evidence of intravascular and/or intraneural injection and image(s) printed for medical record Attempts: 1 Following insertion, line sutured, dressing applied and Biopatch. Post procedure assessment: blood return through all ports, free fluid flow and no air  Patient tolerated the procedure well with no immediate complications.

## 2024-04-10 NOTE — Op Note (Signed)
 [address]       [place] [address]             [phone]                                          04/10/2024 Patient:  Devon Wood Pre-Op Dx: STEMI 3V CAD Obesity   Post-op Dx:  same Procedure: CABG X 3.  LIMA LAD, RSVG PDA, D1 LAD endarterectomy.   Endoscopic greater saphenous vein harvest on the right   Surgeon and Role:      * Arnol Mcgibbon, Linnie KIDD, MD - Primary    * B. Raguel , PA-C - assisting An experienced assistant was required given the complexity of this surgery and the standard of surgical care. The assistant was needed for exposure, dissection, suctioning, retraction of delicate tissues and sutures, instrument exchange and for overall help during this procedure.    Anesthesia  general EBL:  500ml Blood Administration: none Xclamp Time:  72 min Pump Time:  113 min  Drains: 19 F blake drain: L, mediastinal  Wires: none Counts: correct   Indications: 63yo male with 3V CAD, and CKD. He presents with a STEMI. He does not have good PCI targets, thus surgery has been recommended.  Findings: Good vein and LIMA.  Heavily calcified LAD.  Endarterectomy performed.  No good lateral wall targets.  Good PDA and diagonal  Operative Technique: All invasive lines were placed in pre-op holding.  After the risks, benefits and alternatives were thoroughly discussed, the patient was brought to the operative theatre.  Anesthesia was induced, and the patient was prepped and draped in normal sterile fashion.  An appropriate surgical pause was performed, and pre-operative antibiotics were dosed accordingly.  We began with simultaneous incisions along the right leg for harvesting of the greater saphenous vein and the chest for the sternotomy.  In regards to the sternotomy, this was carried down with bovie cautery, and the sternum was divided with a reciprocating saw.  Meticulous hemostasis was obtained.  The left internal thoracic artery was exposed and  harvested in in pedicled fashion.  The patient was systemically heparinized, and the artery was divided distally, and placed in a papaverine sponge.    The sternal elevator was removed, and a retractor was placed.  The pericardium was divided in the midline and fashioned into a cradle with pericardial stitches.   After we confirmed an appropriate ACT, the ascending aorta was cannulated in standard fashion.  The right atrial appendage was used for venous cannulation site.  Cardiopulmonary bypass was initiated, and the heart retractor was placed. The cross clamp was applied, and a dose of anterograde cardioplegia was given with good arrest of the heart.  We moved to the posterior wall of the heart, and found a good target on the PDA.  An arteriotomy was made, and the vein graft was anastomosed to it in an end to side fashion.  Next, we exposed the anterior wall of the heart and identified a good target on diagonal.   An arteriotomy was created.  The vein was anastomosed in an end to side fashion.  Finally, we exposed the LAD.  A soft spot was entered, but there was circumferential calcium .  An endarterectomy was performed.  We fashioned an end to side anastomosis between it and the LITA.  We began to  re-warm, and a re-animation dose of cardioplegia was given.  The heart was de-aired, and the cross clamp was removed.  Meticulous hemostasis was obtained.    A partial occludding clamp was then placed on the ascending aorta, and we created an end to side anastomosis between it and the proximal vein grafts.  Rings were placed on the proximal anastomosis.  Hemostasis was obtained, and we separated from cardiopulmonary bypass without event.  The heparin  was reversed with protamine .  Chest tubes and wires were placed, and the sternum was re-approximated with sternal wires.  The soft tissue and skin were re-approximated wth absorbable suture.    The patient tolerated the procedure without any immediate complications, and  was transferred to the ICU in guarded condition.  Armya Westerhoff MALVA Rayas

## 2024-04-11 ENCOUNTER — Encounter (HOSPITAL_COMMUNITY): Payer: Self-pay | Admitting: Thoracic Surgery (Cardiothoracic Vascular Surgery)

## 2024-04-11 ENCOUNTER — Inpatient Hospital Stay (HOSPITAL_COMMUNITY)

## 2024-04-11 DIAGNOSIS — N1832 Chronic kidney disease, stage 3b: Secondary | ICD-10-CM

## 2024-04-11 DIAGNOSIS — E1165 Type 2 diabetes mellitus with hyperglycemia: Secondary | ICD-10-CM | POA: Diagnosis not present

## 2024-04-11 DIAGNOSIS — Z951 Presence of aortocoronary bypass graft: Secondary | ICD-10-CM

## 2024-04-11 LAB — CBC
HCT: 25.2 % — ABNORMAL LOW (ref 39.0–52.0)
HCT: 22.7 % — ABNORMAL LOW (ref 39.0–52.0)
Hemoglobin: 8.2 g/dL — ABNORMAL LOW (ref 13.0–17.0)
Hemoglobin: 7.2 g/dL — ABNORMAL LOW (ref 13.0–17.0)
MCH: 29 pg (ref 26.0–34.0)
MCH: 28.7 pg (ref 26.0–34.0)
MCHC: 32.5 g/dL (ref 30.0–36.0)
MCHC: 31.7 g/dL (ref 30.0–36.0)
MCV: 89 fL (ref 80.0–100.0)
MCV: 90.4 fL (ref 80.0–100.0)
Platelets: 180 K/uL (ref 150–400)
Platelets: 145 K/uL — ABNORMAL LOW (ref 150–400)
RBC: 2.83 MIL/uL — ABNORMAL LOW (ref 4.22–5.81)
RBC: 2.51 MIL/uL — ABNORMAL LOW (ref 4.22–5.81)
RDW: 13.4 % (ref 11.5–15.5)
RDW: 13.9 % (ref 11.5–15.5)
WBC: 12.4 K/uL — ABNORMAL HIGH (ref 4.0–10.5)
WBC: 11.4 K/uL — ABNORMAL HIGH (ref 4.0–10.5)
nRBC: 0 % (ref 0.0–0.2)
nRBC: 0 % (ref 0.0–0.2)

## 2024-04-11 LAB — BASIC METABOLIC PANEL WITH GFR
Anion gap: 9 (ref 5–15)
Anion gap: 11 (ref 5–15)
BUN: 28 mg/dL — ABNORMAL HIGH (ref 8–23)
BUN: 30 mg/dL — ABNORMAL HIGH (ref 8–23)
CO2: 23 mmol/L (ref 22–32)
CO2: 21 mmol/L — ABNORMAL LOW (ref 22–32)
Calcium: 8.1 mg/dL — ABNORMAL LOW (ref 8.9–10.3)
Calcium: 7.9 mg/dL — ABNORMAL LOW (ref 8.9–10.3)
Chloride: 105 mmol/L (ref 98–111)
Chloride: 101 mmol/L (ref 98–111)
Creatinine, Ser: 2.18 mg/dL — ABNORMAL HIGH (ref 0.61–1.24)
Creatinine, Ser: 2.72 mg/dL — ABNORMAL HIGH (ref 0.61–1.24)
GFR, Estimated: 34 mL/min — ABNORMAL LOW (ref >60)
GFR, Estimated: 26 mL/min — ABNORMAL LOW (ref >60)
Glucose, Bld: 141 mg/dL — ABNORMAL HIGH (ref 70–99)
Glucose, Bld: 233 mg/dL — ABNORMAL HIGH (ref 70–99)
Potassium: 4.6 mmol/L (ref 3.5–5.1)
Potassium: 4.4 mmol/L (ref 3.5–5.1)
Sodium: 137 mmol/L (ref 135–145)
Sodium: 133 mmol/L — ABNORMAL LOW (ref 135–145)

## 2024-04-11 LAB — GLUCOSE, CAPILLARY
Glucose-Capillary: 114 mg/dL — ABNORMAL HIGH (ref 70–99)
Glucose-Capillary: 119 mg/dL — ABNORMAL HIGH (ref 70–99)
Glucose-Capillary: 132 mg/dL — ABNORMAL HIGH (ref 70–99)
Glucose-Capillary: 133 mg/dL — ABNORMAL HIGH (ref 70–99)
Glucose-Capillary: 141 mg/dL — ABNORMAL HIGH (ref 70–99)
Glucose-Capillary: 141 mg/dL — ABNORMAL HIGH (ref 70–99)
Glucose-Capillary: 143 mg/dL — ABNORMAL HIGH (ref 70–99)
Glucose-Capillary: 144 mg/dL — ABNORMAL HIGH (ref 70–99)
Glucose-Capillary: 147 mg/dL — ABNORMAL HIGH (ref 70–99)
Glucose-Capillary: 153 mg/dL — ABNORMAL HIGH (ref 70–99)
Glucose-Capillary: 154 mg/dL — ABNORMAL HIGH (ref 70–99)
Glucose-Capillary: 154 mg/dL — ABNORMAL HIGH (ref 70–99)
Glucose-Capillary: 155 mg/dL — ABNORMAL HIGH (ref 70–99)
Glucose-Capillary: 183 mg/dL — ABNORMAL HIGH (ref 70–99)
Glucose-Capillary: 195 mg/dL — ABNORMAL HIGH (ref 70–99)
Glucose-Capillary: 229 mg/dL — ABNORMAL HIGH (ref 70–99)
Glucose-Capillary: 56 mg/dL — ABNORMAL LOW (ref 70–99)

## 2024-04-11 LAB — MAGNESIUM
Magnesium: 2.8 mg/dL — ABNORMAL HIGH (ref 1.7–2.4)
Magnesium: 2.5 mg/dL — ABNORMAL HIGH (ref 1.7–2.4)

## 2024-04-11 MED ORDER — ENOXAPARIN SODIUM 30 MG/0.3ML IJ SOSY: 30.0000 mg | PREFILLED_SYRINGE | Freq: Every day | INTRAMUSCULAR | Status: DC

## 2024-04-11 MED ORDER — INSULIN GLARGINE-YFGN 100 UNIT/ML ~~LOC~~ SOLN
18.0000 [IU] | Freq: Every day | SUBCUTANEOUS | Status: DC
  Administered 2024-04-11 – 2024-04-13 (×3): 18 [IU] via SUBCUTANEOUS
  Filled 2024-04-11 (×4): qty 0.18

## 2024-04-11 MED ORDER — INSULIN ASPART 100 UNIT/ML IJ SOLN
0.0000 [IU] | INTRAMUSCULAR | Status: DC
  Administered 2024-04-11: 4 [IU] via SUBCUTANEOUS
  Administered 2024-04-11: 8 [IU] via SUBCUTANEOUS
  Administered 2024-04-11 – 2024-04-12 (×2): 2 [IU] via SUBCUTANEOUS

## 2024-04-11 MED ORDER — GABAPENTIN 100 MG PO CAPS
100.0000 mg | ORAL_CAPSULE | Freq: Three times a day (TID) | ORAL | Status: DC
  Administered 2024-04-11 – 2024-04-12 (×5): 100 mg via ORAL
  Filled 2024-04-11 (×6): qty 1

## 2024-04-11 MED ORDER — ASPIRIN 81 MG PO TBEC
81.0000 mg | DELAYED_RELEASE_TABLET | Freq: Every day | ORAL | Status: DC
  Administered 2024-04-11 – 2024-04-15 (×6): 81 mg via ORAL
  Filled 2024-04-11 (×5): qty 1

## 2024-04-11 MED ORDER — CLOPIDOGREL BISULFATE 75 MG PO TABS
75.0000 mg | ORAL_TABLET | Freq: Every day | ORAL | Status: DC
  Administered 2024-04-11 – 2024-04-15 (×6): 75 mg via ORAL
  Filled 2024-04-11 (×5): qty 1

## 2024-04-11 MED ORDER — ENOXAPARIN SODIUM 40 MG/0.4ML IJ SOSY
40.0000 mg | PREFILLED_SYRINGE | INTRAMUSCULAR | Status: DC
  Administered 2024-04-11: 40 mg via SUBCUTANEOUS
  Filled 2024-04-11: qty 0.4

## 2024-04-11 MED ORDER — METHOCARBAMOL 500 MG PO TABS
500.0000 mg | ORAL_TABLET | Freq: Three times a day (TID) | ORAL | Status: DC
  Administered 2024-04-11: 500 mg via ORAL
  Filled 2024-04-11: qty 1

## 2024-04-11 NOTE — Plan of Care (Signed)
  Problem: Education: Goal: Knowledge of General Education information will improve Description: Including pain rating scale, medication(s)/side effects and non-pharmacologic comfort measures Outcome: Progressing   Problem: Health Behavior/Discharge Planning: Goal: Ability to manage health-related needs will improve Outcome: Progressing   Problem: Clinical Measurements: Goal: Ability to maintain clinical measurements within normal limits will improve Outcome: Progressing Goal: Will remain free from infection Outcome: Progressing Goal: Diagnostic test results will improve Outcome: Progressing Goal: Respiratory complications will improve Outcome: Progressing Goal: Cardiovascular complication will be avoided Outcome: Progressing   Problem: Activity: Goal: Risk for activity intolerance will decrease Outcome: Progressing   Problem: Nutrition: Goal: Adequate nutrition will be maintained Outcome: Progressing   Problem: Coping: Goal: Level of anxiety will decrease Outcome: Progressing   Problem: Elimination: Goal: Will not experience complications related to bowel motility Outcome: Progressing Goal: Will not experience complications related to urinary retention Outcome: Progressing   Problem: Pain Managment: Goal: General experience of comfort will improve and/or be controlled Outcome: Progressing   Problem: Safety: Goal: Ability to remain free from injury will improve Outcome: Progressing   Problem: Skin Integrity: Goal: Risk for impaired skin integrity will decrease Outcome: Progressing   Problem: Activity: Goal: Risk for activity intolerance will decrease Outcome: Progressing   Problem: Respiratory: Goal: Respiratory status will improve Outcome: Progressing   Problem: Urinary Elimination: Goal: Ability to achieve and maintain adequate renal perfusion and functioning will improve Outcome: Progressing

## 2024-04-11 NOTE — Progress Notes (Signed)
 NAME:  THELMER LEGLER, MRN:  [id number], DOB:  1960-09-22, LOS: 1 ADMISSION DATE:  04/10/2024, CONSULTATION DATE:  04/10/24 REFERRING MD:  Shyrl- TCTS, CHIEF COMPLAINT:  post-CABG   History of Present Illness:  63 year old male patient with known history of coronary artery disease, hypertension and CKD stage III.  Had previous stent back in January 2025, at which time multivessel coronary artery disease.  He presented to cardiology office on 03/29/24 for evaluation of new chest pain.  He had been treated in the outpatient setting with Brilinta , aspirin , Zetia , lovastatin and metoprolol . He underwent cardiac catheterization on 7/30, demonstrating  multivessel coronary artery disease including restenosis of the proximal mid LAD stents and significant disease involving the circumflex and the RCA.  He was seen by cardiothoracic surgery and was deemed candidate for three-vessel coronary artery bypass grafting. He was transferred to the ICU intubated on neosynephrine.  Paralytics not reversed in OR.   Presented to [facility] for elective CABG on 8/6 Cross clamp: 71 min Bypass time: 112 min EBL:  900cc Cell saver: 462 cc  Pertinent  Medical History  Cardiac artery disease involving the LAD previous stent, circumflex and RCA.  EF 65 to 70%, no regional wall abnormality, hypertension, mixed hyperlipidemia, type 2 diabetes CKD stage III PUD HTN  Significant Hospital Events: Including procedures, antibiotic start and stop dates in addition to other pertinent events   8/6 presented for planned three-vessel CABG , transferred to ICU on neosynephrine and intubated.  Interim History / Subjective:  Overnight had fever- Tmax 100.8.  Remains on NE this morning. He complains of pain that is not improved much with pain meds.  Objective    Blood pressure 139/72, pulse (!) 105, temperature (S) (!) 100.8 F (38.2 C), temperature source Bladder, resp. rate (!) 23, height 5' 6 (1.676 m), weight 106.6 kg, SpO2  97%. CVP:  [1 mmHg-16 mmHg] 9 mmHg CO:  [3.9 L/min-11.2 L/min] 9.3 L/min CI:  [1.8 L/min/m2-5.3 L/min/m2] 4.3 L/min/m2  Vent Mode: CPAP;PSV FiO2 (%):  [40 %-50 %] 40 % Set Rate:  [4 bmp-16 bmp] 4 bmp Vt Set:  [510 mL] 510 mL PEEP:  [5 cmH20] 5 cmH20 Pressure Support:  [5 cmH20-10 cmH20] 10 cmH20 Plateau Pressure:  [17 cmH20-19 cmH20] 19 cmH20   Intake/Output Summary (Last 24 hours) at 04/11/2024 0714 Last data filed at 04/11/2024 0700 Gross per 24 hour  Intake 6464.42 ml  Output 5351 ml  Net 1113.42 ml   Filed Weights   04/10/24 0651  Weight: 106.6 kg    Examination: General: chronically ill appearing man sitting up in bed watching TV HENT: [employer]/AT, eyes anicteric Lungs: breathing comfortably on [place], decreased basilar breath sounds, CTA. Cardiovascular: S1S2, tachycardic, reg rhythm Abdomen: soft, NT Extremities: RLE compressive dressing, mild LE edema Neuro: awake, alert, answering questions appropriately. GU:  clear yellow urine  BUN 28 Cr 1.28 WBC 12.4 H/H 8.2/25.2 Platelets 180 CXR personally reviewed> low lung volumes, basilar atelectasis, RIJ CVC EKG personally reviewed> sinus tach, no ischemic changes.   Resolved problem list   Assessment and Plan  3-vessel CAD with NSTEMI; LVEF 65-70% S/p CABG x 3, required end-arterectomy during procedure -post-op care per TCTS -complete post op antibiotics today -pain control per protocol- morphine , tramadol , oxycodone . -adding gabapentin ; prefer this to robaxin  due to QTc -aspirin  81mg , statin daily -plavix   -wean NE; hold metoprolol  this morning -tylenol  for post-op fevers -pulmonary hygiene -wean off O2  HLD -con't  -con't PTA zetia   Hyperglycemia; h/o uncontrolled  DM. A1c 9.1 -transition to basal bolus insulin  -likely needs insulin  at discharge due to incomplete control on PTA oral regimen -jardiance , metformin , sitagliptin,  ABLA- expected post-op Consumptive coagulopathy- expected post-op -no need for  transfusions currently  CKD stage IIIb; high risk for AKI; Baseline serum creatinine 2.3-2.6 -strict I/O -maintain adequate perfusion -renally dose meds, avoid nephrotoxic meds  Prolonged Qtc- persistent on today's CXR -monitor on tele -avoid QT prolonging meds -monitor electrolytes and replete as needed  Obesity -long term weight loss  Best Practice (right click and Reselect all SmartList Selections daily)   Diet/type: clear liquids ADAT DVT prophylaxis LMWH Pressure ulcer(s): N/A GI prophylaxis: PPI Lines: Central line, Arterial Line, and yes and it is still needed Foley:  removal ordered  Code Status:  full code Last date of multidisciplinary goals of care discussion [ ]   Labs   CBC: Recent Labs  Lab 04/04/24 0858 04/08/24 1000 04/10/24 0901 04/10/24 1230 04/10/24 1233 04/10/24 1438 04/10/24 1440 04/10/24 1819 04/10/24 2030 04/10/24 2203 04/10/24 2323 04/11/24 0455  WBC 7.5 8.2  --   --   --  7.4  --   --  10.7*  --   --  12.4*  HGB 12.4* 12.2*   < > 6.9*   < > 7.9*   < > 7.8* 8.2* 7.5* 7.8* 8.2*  HCT 37.3* 37.6*   < > 20.7*   < > 24.1*   < > 23.0* 24.7* 22.0* 23.0* 25.2*  MCV 87.1 88.7  --   --   --  88.0  --   --  87.9  --   --  89.0  PLT 210 231  --  158  --  109*  --   --  152  --   --  180   < > = values in this interval not displayed.    Basic Metabolic Panel: Recent Labs  Lab 04/04/24 0858 04/08/24 1000 04/10/24 0901 04/10/24 1206 04/10/24 1233 04/10/24 1303 04/10/24 1344 04/10/24 1440 04/10/24 1819 04/10/24 2030 04/10/24 2203 04/10/24 2323 04/11/24 0455  NA 135 134*   < > 135 138   < > 138   < > 142 139 142 142 137  K 4.5 4.8   < > 5.4* 6.0*   < > 4.8   < > 5.2* 5.0 4.5 4.6 4.6  CL 108 103   < > 100 101  --  104  --   --  108  --   --  105  CO2 20* 22  --   --   --   --   --   --   --  22  --   --  23  GLUCOSE 176* 196*   < > 136* 147*  --  210*  --   --  133*  --   --  141*  BUN 38* 38*   < > 37* 35*  --  34*  --   --  30*  --   --   28*  CREATININE 2.62* 2.32*   < > 1.80* 1.80*  --  1.80*  --   --  1.96*  --   --  2.18*  CALCIUM  8.8* 8.9  --   --   --   --   --   --   --  8.0*  --   --  8.1*  MG  --   --   --   --   --   --   --   --   --  3.2*  --   --  2.8*   < > = values in this interval not displayed.   GFR: Estimated Creatinine Clearance: 40.7 mL/min (A) (by C-G formula based on SCr of 2.18 mg/dL (H)). Recent Labs  Lab 04/08/24 1000 04/10/24 1438 04/10/24 2030 04/11/24 0455  WBC 8.2 7.4 10.7* 12.4*    Liver Function Tests: Recent Labs  Lab 04/08/24 1000  AST 28  ALT 26  ALKPHOS 51  BILITOT 0.7  PROT 7.7  ALBUMIN  4.1   Critical care time:     This patient is critically ill with multiple organ system failure which requires frequent high complexity decision making, assessment, support, evaluation, and titration of therapies. This was completed through the application of advanced monitoring technologies and extensive interpretation of multiple databases. During this encounter critical care time was devoted to patient care services described in this note for 34 minutes.   Leita SHAUNNA Gaskins, DO 04/11/24 8:52 AM [facility]  For contact information, see Amion. If no response to pager, please call PCCM consult pager. After hours, 7PM- 7AM, please call Elink.

## 2024-04-11 NOTE — Progress Notes (Signed)
 Patient ID: Devon Wood, male   DOB: December 30, 1960, 63 y.o.   MRN: [id number] TCTS Evening Rounds:  Hemodynamically stable off NE. CI 3.4 by FlowTrack.  UO ok today while foley in but only 100 out since it was removed. Follow. Creat bumped to 2.72.  CT output low  BMET    Component Value Date/Time   NA 133 (L) 04/11/2024 1715   NA 135 04/01/2024 0959   K 4.4 04/11/2024 1715   CL 101 04/11/2024 1715   CO2 21 (L) 04/11/2024 1715   GLUCOSE 233 (H) 04/11/2024 1715   BUN 30 (H) 04/11/2024 1715   BUN 38 (H) 04/01/2024 0959   CREATININE 2.72 (H) 04/11/2024 1715   CALCIUM  7.9 (L) 04/11/2024 1715   EGFR 29 (L) 04/01/2024 0959   GFRNONAA 26 (L) 04/11/2024 1715   CBC    Component Value Date/Time   WBC 11.4 (H) 04/11/2024 1715   RBC 2.51 (L) 04/11/2024 1715   HGB 7.2 (L) 04/11/2024 1715   HGB 12.2 (L) 04/01/2024 0959   HCT 22.7 (L) 04/11/2024 1715   HCT 38.4 04/01/2024 0959   PLT 145 (L) 04/11/2024 1715   PLT 216 04/01/2024 0959   MCV 90.4 04/11/2024 1715   MCV 91 04/01/2024 0959   MCH 28.7 04/11/2024 1715   MCHC 31.7 04/11/2024 1715   RDW 13.9 04/11/2024 1715   RDW 13.3 04/01/2024 0959   LYMPHSABS 1.8 09/06/2023 1355   MONOABS 0.9 09/06/2023 1355   EOSABS 0.1 09/06/2023 1355   BASOSABS 0.1 09/06/2023 1355   Follow up labs in am.

## 2024-04-11 NOTE — Progress Notes (Signed)
      [address]       [place] [address]             [phone]                 1 Day Post-Op Procedure(s) (LRB): CORONARY ARTERY BYPASS GRAFTING (CABG) TIMES THREE USING LEFT INTERNAL MAMMARY ARTERY AND ENDOSCOPICALLY HARVESTED RIGHT GREATER SAPHENOUS VEIN (N/A) ECHOCARDIOGRAM, TRANSESOPHAGEAL, INTRAOPERATIVE (N/A)   Events: No events extubated _______________________________________________________________ Vitals: BP 139/72   Pulse (!) 105   Temp 99.3 F (37.4 C) (Bladder)   Resp (!) 23   Ht 5' 6 (1.676 m)   Wt 106.6 kg   SpO2 97%   BMI 37.93 kg/m  Filed Weights   04/10/24 0651  Weight: 106.6 kg     - Neuro: alert NAD  - Cardiovascular: sinus tach  Drips: levo 2.   CVP:  [1 mmHg-16 mmHg] 9 mmHg CO:  [3.9 L/min-11.2 L/min] 9.3 L/min CI:  [1.8 L/min/m2-5.3 L/min/m2] 4.3 L/min/m2  - Pulm: EWOB  ABG    Component Value Date/Time   PHART 7.305 (L) 04/10/2024 2323   PCO2ART 46.0 04/10/2024 2323   PO2ART 114 (H) 04/10/2024 2323   HCO3 22.6 04/10/2024 2323   TCO2 24 04/10/2024 2323   ACIDBASEDEF 3.0 (H) 04/10/2024 2323   O2SAT 98 04/10/2024 2323    - Abd: ND - Extremity: warm  .Intake/Output      08/06 0701 08/07 0700 08/07 0701 08/08 0700   P.O. 425    I.V. (mL/kg) 2820.2 (26.5)    Blood 462    IV Piggyback 2757.2    Total Intake(mL/kg) 6464.4 (60.6)    Urine (mL/kg/hr) 4605 (1.8)    Blood 900    Chest Tube 396    Total Output 5901    Net +563.4            _______________________________________________________________ Labs:    Latest Ref Rng & Units 04/11/2024    4:55 AM 04/10/2024   11:23 PM 04/10/2024   10:03 PM  CBC  WBC 4.0 - 10.5 K/uL 12.4     Hemoglobin 13.0 - 17.0 g/dL 8.2  7.8  7.5   Hematocrit 39.0 - 52.0 % 25.2  23.0  22.0   Platelets 150 - 400 K/uL 180         Latest Ref Rng & Units 04/11/2024    4:55 AM 04/10/2024   11:23 PM 04/10/2024   10:03 PM  CMP  Glucose 70 - 99 mg/dL 858     BUN 8 - 23 mg/dL 28      Creatinine 9.38 - 1.24 mg/dL 7.81     Sodium 864 - 854 mmol/L 137  142  142   Potassium 3.5 - 5.1 mmol/L 4.6  4.6  4.5   Chloride 98 - 111 mmol/L 105     CO2 22 - 32 mmol/L 23     Calcium  8.9 - 10.3 mg/dL 8.1       CXR: PV congestion  _______________________________________________________________  Assessment and Plan: POD 1 s/p CABG with LAD endart  Neuro: adjusting pain medication CV: will start plavix  today.  On A/S/BB Pulm: IS, ambulation Renal: creat up.  Will hold on diuresis GI: diet today Heme: will start plavix .   ID: afebrile Endo: SSI Dispo: continue ICU care   Linnie MALVA Rayas 04/11/2024 7:53 AM

## 2024-04-11 NOTE — Inpatient Diabetes Management (Signed)
 Inpatient Diabetes Program Recommendations  AACE/ADA: New Consensus Statement on Inpatient Glycemic Control (2015)  Target Ranges:  Prepandial:   less than 140 mg/dL      Peak postprandial:   less than 180 mg/dL (1-2 hours)      Critically ill patients:  140 - 180 mg/dL   Lab Results  Component Value Date   GLUCAP 229 (H) 04/11/2024   HGBA1C 9.1 (H) 04/08/2024    Review of Glycemic Control  Diabetes history: DM2 Outpatient Diabetes medications: metformin  1000 mg BID, Jardiance  10 daily Current orders for Inpatient glycemic control: Lantus  18 daily, Novolog  0-24 units Q4h  HgbA1C - 9.1% 156, 247 mg/dL  Inpatient Diabetes Program Recommendations:    Add Novolog  3 units TID with meals  Spoke with pt at bedside regarding his diabetes and going home on insulin . Pt was ok with going home on insulin  and stated he knew how to give insulin  from doing this for his Mom when she had diabetes. Demonstrated insulin  pen use and discussed proper storage of unused insulin , disposable needles, etc. Discussed hypoglycemia s/s and treatment.  HgbA1c of 9.1% and goal of < 7%. Pt states he cooks for himself and needs to work on portion control.  Ordered Living Well book and insulin  pen starter kit. Discussed importance of checking CBGs and maintaining good CBG control to prevent long-term and short-term complications. Explained how hyperglycemia leads to damage within blood vessels which lead to the common complications seen with uncontrolled diabetes. Stressed to the patient the importance of improving glycemic control to prevent further complications from uncontrolled diabetes. Discussed impact of nutrition, exercise, stress, sickness, and medications on diabetes control.  Discussed carbohydrates, carbohydrate goals per day and meal, along with portion sizes.  Will f/u this afternoon and allow pt to demonstrated insulin  pen administration. Pt states he doesn't know whether he's interested in wearing CGM.  Will bring [employer] and place if pt agrees and MD orders.   F/U late afternoon today.  Thank you. Shona Brandy, RD, LDN, CDCES Inpatient Diabetes Coordinator [phone]

## 2024-04-11 NOTE — Discharge Summary (Incomplete)
 [address] [place] [address]             [phone]        Physician Discharge Summary  Patient ID: Devon Wood MRN: [id number] DOB/AGE: 04-10-1961 63 y.o.  Admit date: 04/10/2024 Discharge date: 04/15/2024  Admission Diagnoses:  Patient Active Problem List   Diagnosis Date Noted   Chest pain 04/04/2024   CKD (chronic kidney disease), stage III (HCC) 09/07/2023   STEMI (ST elevation myocardial infarction) (HCC) 09/06/2023   Myelopathy concurrent with and due to spinal stenosis of cervical region (HCC) 08/11/2021   Myelopathy (HCC) 07/28/2021   Painful orthopaedic hardware (HCC) 10/24/2018   Dyslipidemia (high LDL; low HDL) 09/29/2018   Spinal stenosis 04/05/2017   Severe obesity (BMI 35.0-35.9 with comorbidity) (HCC) 04/04/2017   CAD (coronary artery disease) 10/10/2013   Mixed hyperlipidemia 10/10/2013   DM type 2 causing complication (HCC) 10/10/2013   Essential hypertension 10/10/2013   Morbid obesity (HCC) 10/10/2013   Lumbar spinal stenosis 10/10/2013     Discharge Diagnoses:  Patient Active Problem List   Diagnosis Date Noted   S/P CABG x 3 04/10/2024   Chest pain 04/04/2024   CKD (chronic kidney disease), stage III (HCC) 09/07/2023   STEMI (ST elevation myocardial infarction) (HCC) 09/06/2023   Myelopathy concurrent with and due to spinal stenosis of cervical region (HCC) 08/11/2021   Myelopathy (HCC) 07/28/2021   Painful orthopaedic hardware (HCC) 10/24/2018   Dyslipidemia (high LDL; low HDL) 09/29/2018   Spinal stenosis 04/05/2017   Severe obesity (BMI 35.0-35.9 with comorbidity) (HCC) 04/04/2017   CAD (coronary artery disease) 10/10/2013   Mixed hyperlipidemia 10/10/2013   DM type 2 causing complication (HCC) 10/10/2013   Essential hypertension 10/10/2013   Morbid obesity (HCC) 10/10/2013   Lumbar spinal stenosis 10/10/2013     Discharged Condition: stable  History of Present Illness:     [name] is  a 63 year old gentleman with a past history notable for hypertension, dyslipidemia, type 2 diabetes mellitus (hemoglobin A1c 9.5), CKD stage IIIb, spinal stenosis with cardiomyopathy, anxiety with depression, and who is a former 4-pack-year cigarette smoker.  He has class II obesity with a BMI of 38.  Devon Wood has known coronary artery disease having undergone PCI to the LAD in 2004.  In January of this year, presented with an acute ST elevation myocardial infarction and subsequently underwent left heart catheterization with urgent PCI with a drug-eluting stent to the the firs diagonal coronary artery (previously labeled ramus intermediate coronary artery on the cath report).  At the time, left heart catheterization demonstrated diffuse multivessel coronary artery disease not felt to be amenable to further PCI.  He and his cardiologist decided to continue with medical management rather pursue referral for CABG due to potential for perioperative worsening of his stage III CKD.  He has been maintained on dual antiplatelet therapy with aspirin  81 mg and Brilinta  along with rosuvastatin , Zetia , lisinopril , and metoprolol .  He was seen in follow-up by Dr. Arnetha 03/29/2024 with complaints of chest pain over the course of the previous 2 weeks.  The pain was not relieved with Pepto-Bismol or his GERD medications but was relieved with nitroglycerin .  He also reported hypertension during these episodes with his blood pressure as high as 202/102.  Evaluation with left heart catheterization w and in-stent stenoses as recommended.  He had elective heart catheterization yesterday showing high-grade in-stent stenoses in both  the diagonal and LAD. In addition, there is diffuse mid and distal LAD disease with 3 lesions ranging from 60 to 80%.  The 1st and 2nd obtuse marginals have 75 and 80% stenoses respectively.  The posterior descending coronary artery is diffusely diseased with 70 to 90% tandem lesions.  The most recent  echocardiogram was done in April of this year showing 60 to 65% left ventricular ejection fraction.  RV function was normal.  There were no significant valvular lesions present the ascending aorta was estimated at 43 mm in diameter by echo.  Follow-up CTA chest showed the ascending aorta to measure 4.0 cm which was stable compared with CTA in December 2022.  There was no evidence of dissection. Devon Wood is referred for consideration of coronary bypass grafting in the setting of multivessel coronary artery disease with high-grade in-stent stenoses of the LAD and diagonal with progressive angina.   Devon Wood is sitting in the bedside chair and denies having any chest pain in the past 2 days.  He is married and lives with his wife who is in poor health.  He has 2 sons who live locally who would be willing to assist him with postoperative rehab.  Devon Wood has retired on disability after a fall at work in 2022 resulting in multiple rib fractures.  He also has chronic back and neck pain but is able to move about unassisted.  He continues to do his own yard work.  He is right-handed.  He has not seen a dentist in several years but has no current dental issues he is concerned about.    Last dose of Brilinta  was 04/02/2024.  Dr. Shyrl reviewed the patient's diagnostic studies and determined he would benefit from surgical intervention. He reviewed the patient's treatment options as well as the risks and benefits of surgery. Devon Wood was agreeable to proceed with surgery.  Hospital Course: Devon Wood remained inpatient at [facility] and remained stable. He was brought to the operating room on 04/10/24 and underwent CABG x 3 utilizing LIMA to LAD, SVG to PDA, and SVG to Diagonal as well as endarterectomy of the LAD and endoscopic harvest of the right greater saphenous vein. He tolerated the surgery well and was transferred to the SICU in stable condition. He was extubated the night of surgery without complication.  He required Levophed  for hemodynamic support, drips were weaned as hemodynamics tolerated. Swan ganz catheter and arterial line were removed without complication. Plavix  was started for endarterectomy. His epicardial pacing wires and chest tubes were removed without complication. He was started on low dose Lopressor . He developed expected acute postoperative blood loss anemia and was transfused with 1U PRBCs and started on an iron supplement. He was routinely diuresed. He was felt stable for transfer to the progressive unit. He developed sinus tachycardia, Lopressor  was titrated and this improved. He was still edematous on exam but he was under his preoperative weight and creatinine remained stable, Dr. Shyrl recommended to not discharge the patient home with Lasix . He was ambulating well on room air. His bowels were moving appropriately. His incisions were healing well without sign of infection. He was felt stable for discharge home.     Consults: Critical care  Significant Diagnostic Studies:  LEFT HEART CATH AND CORONARY ANGIOGRAPHY   Coronary angiography 04/03/2024: LM: No significant disease LAD: Ostial 80% de novo stenosis           Prox-mid LAD stent with 80% restenosis  High diag with prox 80% de novo stenosis, followed by stent with focal mid stent 95% restenosis            (Stented on 09/06/2023 for STEMI, previously called ramus intermedius)           Mid to distal LAD skip lesions of 60-80%, Distal apical LAD small caliber with diffuse 60% disease Lcx: Mild diffuse disease         Small OM1 with prox 75% disease         Medium OM2 with diffuse 70% disease RCA: Mild diffuse disease           Distal focal 75% stenosis           Ostial RPDA with 90% stenosis distal 80% diffuse disease   LVEDP 9 mmHg    ECHOCARDIOGRAM REPORT    Patient Name:   VERLIN UHER Fuhr Date of Exam: 04/04/2024  Medical Rec #:  [id number]     Height:       66.0 in  Accession #:    [id number]     Weight:       238.0 lb  Date of Birth:  December 29, 1960     BSA:          2.153 m  Patient Age:    62 years      BP:           128/71 mmHg  Patient Gender: M             HR:           69 bpm.  Exam Location:  Inpatient   Procedure: 2D Echo, Cardiac Doppler and Color Doppler (Both Spectral and  Color            Flow Doppler were utilized during procedure).   Indications:    CAD Native Vessel i25.10    History:        Patient has prior history of Echocardiogram examinations,  most                 recent 09/07/2023. CAD; Risk Factors:Hypertension, Diabetes  and                 Dyslipidemia.    Sonographer:    Damien Senior RDCS  Referring Phys: [phone] LINDSAY B ROBERTS   IMPRESSIONS    1. Left ventricular ejection fraction, by estimation, is 65 to 70%. The  left ventricle has normal function. The left ventricle has no regional  wall motion abnormalities. There is mild concentric left ventricular  hypertrophy. Left ventricular diastolic  parameters are consistent with Grade II diastolic dysfunction  (pseudonormalization).   2. Right ventricular systolic function is normal. The right ventricular  size is mildly enlarged. Tricuspid regurgitation signal is inadequate for  assessing PA pressure.   3. The mitral valve is normal in structure. Trivial mitral valve  regurgitation.   4. The aortic valve has an indeterminant number of cusps. Aortic valve  regurgitation is not visualized.   5. Aortic dilatation noted. 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   6. The inferior vena cava is normal in size with greater than 50%  respiratory variability, suggesting right atrial pressure of 3 mmHg.   FINDINGS   Left Ventricle: Left ventricular ejection fraction, by estimation, is 65  to 70%. The left ventricle has normal function. The left ventricle has no  regional wall motion abnormalities. The left ventricular  internal cavity  size was normal in  size. There is   mild concentric left ventricular hypertrophy. Left ventricular diastolic  parameters are consistent with Grade II diastolic dysfunction  (pseudonormalization).   Right Ventricle: The right ventricular size is mildly enlarged. No  increase in right ventricular wall thickness. Right ventricular systolic  function is normal. Tricuspid regurgitation signal is inadequate for  assessing PA pressure.   Left Atrium: Left atrial size was normal in size.   Right Atrium: Right atrial size was normal in size.   Pericardium: Trivial pericardial effusion is present.   Mitral Valve: The mitral valve is normal in structure. Trivial mitral  valve regurgitation.   Tricuspid Valve: The tricuspid valve is normal in structure. Tricuspid  valve regurgitation is trivial.   Aortic Valve: The aortic valve has an indeterminant number of cusps.  Aortic valve regurgitation is not visualized.   Pulmonic Valve: The pulmonic valve was normal in structure. Pulmonic valve  regurgitation is not visualized.   Aorta: Aortic dilatation noted. 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   Venous: The inferior vena cava is normal in size with greater than 50%  respiratory variability, suggesting right atrial pressure of 3 mmHg.   IAS/Shunts: No atrial level shunt detected by color flow Doppler.     LEFT VENTRICLE  PLAX 2D  LVIDd:         4.50 cm   Diastology  LVIDs:         2.70 cm   LV e' medial:    5.22 cm/s  LV PW:         1.10 cm   LV E/e' medial:  17.0  LV IVS:        1.10 cm   LV e' lateral:   9.03 cm/s  LVOT diam:     2.40 cm   LV E/e' lateral: 9.8  LV SV:         82  LV SV Index:   38  LVOT Area:     4.52 cm     RIGHT VENTRICLE  RV S prime:     11.20 cm/s  TAPSE (M-mode): 2.4 cm   LEFT ATRIUM             Index        RIGHT ATRIUM           Index  LA diam:        3.20 cm 1.49 cm/m   RA Area:     21.90 cm  LA Vol (A2C):   65.0 ml  30.19 ml/m  RA Volume:   53.90 ml  25.03 ml/m  LA Vol (A4C):   73.3 ml 34.04 ml/m  LA Biplane Vol: 73.0 ml 33.90 ml/m   AORTIC VALVE  LVOT Vmax:   86.20 cm/s  LVOT Vmean:  58.900 cm/s  LVOT VTI:    0.182 m    AORTA  Ao Root diam: 4.00 cm  Ao Asc diam:  4.00 cm   MITRAL VALVE  MV Area (PHT): 3.16 cm    SHUNTS  MV Decel Time: 240 msec    Systemic VTI:  0.18 m  MV E velocity: 88.60 cm/s  Systemic Diam: 2.40 cm  MV A velocity: 77.00 cm/s  MV E/A ratio:  1.15   Toribio Fuel MD  Electronically signed by Toribio Fuel MD  Signature Date/Time: 04/04/2024/6:46:51 PM        Final  Treatments: surgery: 04/10/2024 Patient:  Devon Wood Pre-Op Dx: STEMI 3V CAD Obesity   Post-op Dx:  same Procedure: CABG X 3.  LIMA LAD, RSVG PDA, D1 LAD endarterectomy.   Endoscopic greater saphenous vein harvest on the right  Surgeon and Role:      * Lightfoot, Linnie KIDD, MD - Primary    * B. Raguel , PA-C - assisting An experienced assistant was required given the complexity of this surgery and the standard of surgical care. The assistant was needed for exposure, dissection, suctioning, retraction of delicate tissues and sutures, instrument exchange and for overall help during this procedure.  Discharge Exam: Blood pressure 136/73, pulse 99, temperature 99.4 F (37.4 C), temperature source Oral, resp. rate 20, height 5' 6 (1.676 m), weight 105.5 kg, SpO2 95%. General appearance: alert, cooperative, and no distress Neurologic: intact Heart: regular rate and rhythm, S1, S2 normal, no murmur, click, rub or gallop Lungs: diminished bibasilar breath sounds Abdomen: soft, non-tender; bowel sounds normal; no masses,  no organomegaly Extremities: edema 1+ BLE improved from yesterday Wound: Clean and dry without sign of infection  Discharge Medications:  The patient has been discharged on:   1.Beta Blocker:  Yes [  X ]                              No   [   ]                               If No, reason:  2.Ace Inhibitor/ARB: Yes [   ]                                     No  [   X ]                                     If No, reason: Soft BP, titrated Lopressor  for sinus tachycardia  3.Statin:   Yes [  X ]                  No  [   ]                  If No, reason:  4.Ecasa:  Yes  [  X ]                  No   [   ]                  If No, reason:  Patient had ACS upon admission: No  Plavix /P2Y12 inhibitor: Yes [  X ] Endarterectomy                                      No  [   ]     Discharge Instructions     Amb Referral to Cardiac Rehabilitation   Complete by: As directed    Diagnosis: CABG   CABG X ___: 3   After initial evaluation and assessments completed: Virtual Based Care may be provided alone or in conjunction with Phase 2 Cardiac Rehab based on patient barriers.:  Yes   Intensive Cardiac Rehabilitation (ICR) [facility] location only OR Traditional Cardiac Rehabilitation (TCR) *If criteria for ICR are not met will enroll in TCR ([facility] only): Yes      Allergies as of 04/15/2024       Reactions   Erythromycin Rash        Medication List     STOP taking these medications    meloxicam  15 MG tablet Commonly known as: MOBIC    metoprolol  succinate 50 MG 24 hr tablet Commonly known as: TOPROL -XL   nitroGLYCERIN  0.4 MG SL tablet Commonly known as: NITROSTAT        TAKE these medications    acetaminophen  325 MG tablet Commonly known as: Tylenol  Take 2 tablets (650 mg total) by mouth every 6 (six) hours as needed for mild pain (pain score 1-3).   aspirin  81 MG tablet Take 81 mg by mouth daily.   clopidogrel  75 MG tablet Commonly known as: PLAVIX  Take 1 tablet (75 mg total) by mouth daily.   co-enzyme Q-10 30 MG capsule Take 30 mg by mouth 3 (three) times daily.   empagliflozin  10 MG Tabs tablet Commonly known as: JARDIANCE  Take 10 mg by mouth daily.   escitalopram  10 MG tablet Commonly known as: LEXAPRO  Take 1 tablet (10 mg total)  by mouth daily. What changed: how much to take   ezetimibe  10 MG tablet Commonly known as: ZETIA  Take 1 tablet (10 mg total) by mouth daily.   famotidine  20 MG tablet Commonly known as: PEPCID  Take 20 mg by mouth daily.   Fe Fum-Vit C-Vit B12-FA Caps capsule Commonly known as: TRIGELS-F FORTE Take 1 capsule by mouth daily after breakfast.   fluticasone  50 MCG/ACT nasal spray Commonly known as: FLONASE  Place 2 sprays into both nostrils daily as needed for allergies.   metFORMIN  1000 MG tablet Commonly known as: GLUCOPHAGE  Take 1,000 mg by mouth 2 (two) times daily with a meal.   metoprolol  tartrate 25 MG tablet Commonly known as: LOPRESSOR  Take 1.5 tablets (37.5 mg total) by mouth 2 (two) times daily.   rosuvastatin  20 MG tablet Commonly known as: CRESTOR  Take 1 tablet (20 mg total) by mouth daily.   sitaGLIPtin 100 MG tablet Commonly known as: JANUVIA Take 50 mg by mouth daily.   traMADol  50 MG tablet Commonly known as: ULTRAM  Take 1 tablet (50 mg total) by mouth every 6 (six) hours as needed for severe pain (pain score 7-10).        Follow-up Information     Shyrl Linnie KIDD, MD Follow up on 04/26/2024.   Specialty: Cardiothoracic Surgery Why: Virtual appointment is at 2:40PM, please do NOT come to the office as this is a VIRTUAL appointment. Dr. Shyrl will call you. Contact information: [address] [place] [address] [phone]         Rana Lum CROME, NP Follow up on 07/02/2024.   Specialty: Cardiology Why: Cardiology appointment is at 10:55AM Contact information: [address] [place] [address] [phone]         Ransom Other, MD. Schedule an appointment as soon as possible for a visit.   Specialty: Internal Medicine Contact information: [address] [place] [address] [phone]                 Signed:  Con GORMAN Bend, PA-C  04/15/2024, 10:06 AM

## 2024-04-12 ENCOUNTER — Other Ambulatory Visit: Payer: Self-pay

## 2024-04-12 ENCOUNTER — Inpatient Hospital Stay (HOSPITAL_COMMUNITY)

## 2024-04-12 DIAGNOSIS — N1832 Chronic kidney disease, stage 3b: Secondary | ICD-10-CM | POA: Diagnosis not present

## 2024-04-12 DIAGNOSIS — E871 Hypo-osmolality and hyponatremia: Secondary | ICD-10-CM | POA: Diagnosis not present

## 2024-04-12 DIAGNOSIS — Z951 Presence of aortocoronary bypass graft: Secondary | ICD-10-CM | POA: Diagnosis not present

## 2024-04-12 DIAGNOSIS — E1165 Type 2 diabetes mellitus with hyperglycemia: Secondary | ICD-10-CM | POA: Diagnosis not present

## 2024-04-12 LAB — BASIC METABOLIC PANEL WITH GFR
Anion gap: 11 (ref 5–15)
Anion gap: 12 (ref 5–15)
BUN: 30 mg/dL — ABNORMAL HIGH (ref 8–23)
BUN: 33 mg/dL — ABNORMAL HIGH (ref 8–23)
CO2: 22 mmol/L (ref 22–32)
CO2: 21 mmol/L — ABNORMAL LOW (ref 22–32)
Calcium: 7.9 mg/dL — ABNORMAL LOW (ref 8.9–10.3)
Calcium: 7.7 mg/dL — ABNORMAL LOW (ref 8.9–10.3)
Chloride: 100 mmol/L (ref 98–111)
Chloride: 97 mmol/L — ABNORMAL LOW (ref 98–111)
Creatinine, Ser: 2.5 mg/dL — ABNORMAL HIGH (ref 0.61–1.24)
Creatinine, Ser: 2.61 mg/dL — ABNORMAL HIGH (ref 0.61–1.24)
GFR, Estimated: 29 mL/min — ABNORMAL LOW (ref >60)
GFR, Estimated: 27 mL/min — ABNORMAL LOW (ref >60)
Glucose, Bld: 154 mg/dL — ABNORMAL HIGH (ref 70–99)
Glucose, Bld: 253 mg/dL — ABNORMAL HIGH (ref 70–99)
Potassium: 4.2 mmol/L (ref 3.5–5.1)
Potassium: 4.1 mmol/L (ref 3.5–5.1)
Sodium: 133 mmol/L — ABNORMAL LOW (ref 135–145)
Sodium: 130 mmol/L — ABNORMAL LOW (ref 135–145)

## 2024-04-12 LAB — CBC
HCT: 21.2 % — ABNORMAL LOW (ref 39.0–52.0)
Hemoglobin: 6.7 g/dL — CL (ref 13.0–17.0)
MCH: 29 pg (ref 26.0–34.0)
MCHC: 31.6 g/dL (ref 30.0–36.0)
MCV: 91.8 fL (ref 80.0–100.0)
Platelets: 131 K/uL — ABNORMAL LOW (ref 150–400)
RBC: 2.31 MIL/uL — ABNORMAL LOW (ref 4.22–5.81)
RDW: 13.8 % (ref 11.5–15.5)
WBC: 9.6 K/uL (ref 4.0–10.5)
nRBC: 0 % (ref 0.0–0.2)

## 2024-04-12 LAB — PREPARE RBC (CROSSMATCH)

## 2024-04-12 LAB — GLUCOSE, CAPILLARY
Glucose-Capillary: 156 mg/dL — ABNORMAL HIGH (ref 70–99)
Glucose-Capillary: 247 mg/dL — ABNORMAL HIGH (ref 70–99)
Glucose-Capillary: 227 mg/dL — ABNORMAL HIGH (ref 70–99)
Glucose-Capillary: 204 mg/dL — ABNORMAL HIGH (ref 70–99)
Glucose-Capillary: 185 mg/dL — ABNORMAL HIGH (ref 70–99)

## 2024-04-12 LAB — HEMOGLOBIN AND HEMATOCRIT, BLOOD
HCT: 23.6 % — ABNORMAL LOW (ref 39.0–52.0)
Hemoglobin: 7.7 g/dL — ABNORMAL LOW (ref 13.0–17.0)

## 2024-04-12 MED ORDER — INSULIN ASPART 100 UNIT/ML IJ SOLN: 0.0000 [IU] | Freq: Three times a day (TID) | INTRAMUSCULAR | Status: DC

## 2024-04-12 MED ORDER — INSULIN ASPART 100 UNIT/ML IJ SOLN
0.0000 [IU] | Freq: Three times a day (TID) | INTRAMUSCULAR | Status: DC
  Administered 2024-04-13: 8 [IU] via SUBCUTANEOUS
  Administered 2024-04-13: 2 [IU] via SUBCUTANEOUS
  Administered 2024-04-13 – 2024-04-14 (×3): 4 [IU] via SUBCUTANEOUS
  Administered 2024-04-14: 8 [IU] via SUBCUTANEOUS
  Administered 2024-04-14: 12 [IU] via SUBCUTANEOUS
  Administered 2024-04-15 (×2): 2 [IU] via SUBCUTANEOUS

## 2024-04-12 MED ORDER — FENTANYL CITRATE PF 50 MCG/ML IJ SOSY: 25.0000 ug | PREFILLED_SYRINGE | Freq: Once | INTRAMUSCULAR | Status: DC

## 2024-04-12 MED ORDER — FUROSEMIDE 10 MG/ML IJ SOLN
40.0000 mg | Freq: Once | INTRAMUSCULAR | Status: AC
  Administered 2024-04-12: 40 mg via INTRAVENOUS
  Filled 2024-04-12: qty 4

## 2024-04-12 MED ORDER — ~~LOC~~ CARDIAC SURGERY, PATIENT & FAMILY EDUCATION: Freq: Once | Status: AC

## 2024-04-12 MED ORDER — METOCLOPRAMIDE HCL 5 MG/ML IJ SOLN
10.0000 mg | Freq: Four times a day (QID) | INTRAMUSCULAR | Status: AC
  Administered 2024-04-12 – 2024-04-13 (×3): 10 mg via INTRAVENOUS
  Filled 2024-04-12 (×3): qty 2

## 2024-04-12 MED ORDER — INSULIN ASPART 100 UNIT/ML IJ SOLN: 3.0000 [IU] | Freq: Three times a day (TID) | INTRAMUSCULAR | Status: DC

## 2024-04-12 MED ORDER — SODIUM CHLORIDE 0.9% FLUSH: 3.0000 mL | INTRAVENOUS | Status: DC | PRN

## 2024-04-12 MED ORDER — SODIUM CHLORIDE 0.9% FLUSH
3.0000 mL | Freq: Two times a day (BID) | INTRAVENOUS | Status: DC
  Administered 2024-04-12 – 2024-04-14 (×5): 3 mL via INTRAVENOUS

## 2024-04-12 MED ORDER — SODIUM CHLORIDE 0.9 % IV SOLN: 250.0000 mL | INTRAVENOUS | Status: AC | PRN

## 2024-04-12 MED ORDER — INSULIN ASPART 100 UNIT/ML IJ SOLN
3.0000 [IU] | Freq: Three times a day (TID) | INTRAMUSCULAR | Status: DC
  Administered 2024-04-12 (×3): 3 [IU] via SUBCUTANEOUS

## 2024-04-12 MED ORDER — INSULIN ASPART 100 UNIT/ML IJ SOLN
0.0000 [IU] | Freq: Every day | INTRAMUSCULAR | Status: DC
  Administered 2024-04-12 – 2024-04-13 (×2): 2 [IU] via SUBCUTANEOUS
  Administered 2024-04-14: 3 [IU] via SUBCUTANEOUS

## 2024-04-12 MED ORDER — INSULIN ASPART 100 UNIT/ML IJ SOLN
0.0000 [IU] | Freq: Three times a day (TID) | INTRAMUSCULAR | Status: DC
  Administered 2024-04-12 (×3): 5 [IU] via SUBCUTANEOUS

## 2024-04-12 MED ORDER — SODIUM CHLORIDE 0.9% IV SOLUTION: Freq: Once | INTRAVENOUS | Status: AC

## 2024-04-12 MED FILL — Electrolyte-R (PH 7.4) Solution: INTRAVENOUS | Qty: 3000 | Status: AC

## 2024-04-12 MED FILL — Potassium Chloride Inj 2 mEq/ML: INTRAVENOUS | Qty: 40 | Status: AC

## 2024-04-12 MED FILL — Albumin, Human Inj 5%: INTRAVENOUS | Qty: 250 | Status: AC

## 2024-04-12 MED FILL — Lidocaine HCl Local Preservative Free (PF) Inj 2%: INTRAMUSCULAR | Qty: 14 | Status: AC

## 2024-04-12 MED FILL — Calcium Chloride Inj 10%: INTRAVENOUS | Qty: 10 | Status: AC

## 2024-04-12 MED FILL — Sodium Bicarbonate IV Soln 8.4%: INTRAVENOUS | Qty: 200 | Status: AC

## 2024-04-12 MED FILL — Heparin Sodium (Porcine) Inj 1000 Unit/ML: INTRAMUSCULAR | Qty: 20 | Status: AC

## 2024-04-12 MED FILL — Heparin Sodium (Porcine) Inj 1000 Unit/ML: Qty: 1000 | Status: AC

## 2024-04-12 MED FILL — Sodium Chloride IV Soln 0.9%: INTRAVENOUS | Qty: 2000 | Status: AC

## 2024-04-12 MED FILL — Mannitol IV Soln 20%: INTRAVENOUS | Qty: 500 | Status: AC

## 2024-04-12 NOTE — Plan of Care (Signed)
 Problem: Education: Goal: Knowledge of General Education information will improve Description: Including pain rating scale, medication(s)/side effects and non-pharmacologic comfort measures 04/12/2024 1549 by Jerilee Lorrayne HERO, RN Outcome: Progressing 04/12/2024 1548 by Jerilee Lorrayne HERO, RN Outcome: Progressing   Problem: Health Behavior/Discharge Planning: Goal: Ability to manage health-related needs will improve 04/12/2024 1549 by Jerilee Lorrayne HERO, RN Outcome: Progressing 04/12/2024 1548 by Jerilee Lorrayne HERO, RN Outcome: Progressing   Problem: Clinical Measurements: Goal: Ability to maintain clinical measurements within normal limits will improve 04/12/2024 1549 by Jerilee Lorrayne HERO, RN Outcome: Progressing 04/12/2024 1548 by Jerilee Lorrayne HERO, RN Outcome: Progressing Goal: Will remain free from infection 04/12/2024 1549 by Jerilee Lorrayne HERO, RN Outcome: Progressing 04/12/2024 1548 by Jerilee Lorrayne HERO, RN Outcome: Progressing Goal: Diagnostic test results will improve 04/12/2024 1549 by Jerilee Lorrayne HERO, RN Outcome: Progressing 04/12/2024 1548 by Jerilee Lorrayne HERO, RN Outcome: Progressing Goal: Respiratory complications will improve 04/12/2024 1549 by Jerilee Lorrayne HERO, RN Outcome: Progressing 04/12/2024 1548 by Jerilee Lorrayne HERO, RN Outcome: Progressing Goal: Cardiovascular complication will be avoided 04/12/2024 1549 by Jerilee Lorrayne HERO, RN Outcome: Progressing 04/12/2024 1548 by Jerilee Lorrayne HERO, RN Outcome: Progressing   Problem: Activity: Goal: Risk for activity intolerance will decrease 04/12/2024 1549 by Jerilee Lorrayne HERO, RN Outcome: Progressing 04/12/2024 1548 by Jerilee Lorrayne HERO, RN Outcome: Progressing   Problem: Nutrition: Goal: Adequate nutrition will be maintained 04/12/2024 1549 by Jerilee Lorrayne HERO, RN Outcome: Progressing 04/12/2024 1548 by Jerilee Lorrayne HERO, RN Outcome: Progressing   Problem: Elimination: Goal: Will not experience complications related to bowel motility 04/12/2024 1549 by Jerilee Lorrayne HERO, RN Outcome: Progressing 04/12/2024 1548 by Jerilee Lorrayne HERO, RN Outcome: Progressing Goal: Will not experience complications related to urinary retention 04/12/2024 1549 by Jerilee Lorrayne HERO, RN Outcome: Progressing 04/12/2024 1548 by Jerilee Lorrayne HERO, RN Outcome: Progressing   Problem: Pain Managment: Goal: General experience of comfort will improve and/or be controlled 04/12/2024 1549 by Jerilee Lorrayne HERO, RN Outcome: Progressing 04/12/2024 1548 by Jerilee Lorrayne HERO, RN Outcome: Progressing   Problem: Safety: Goal: Ability to remain free from injury will improve 04/12/2024 1549 by Jerilee Lorrayne HERO, RN Outcome: Progressing 04/12/2024 1548 by Jerilee Lorrayne HERO, RN Outcome: Progressing   Problem: Skin Integrity: Goal: Risk for impaired skin integrity will decrease 04/12/2024 1549 by Jerilee Lorrayne HERO, RN Outcome: Progressing 04/12/2024 1548 by Jerilee Lorrayne HERO, RN Outcome: Progressing   Problem: Education: Goal: Will demonstrate proper wound care and an understanding of methods to prevent future damage 04/12/2024 1549 by Jerilee Lorrayne HERO, RN Outcome: Progressing 04/12/2024 1548 by Jerilee Lorrayne HERO, RN Outcome: Progressing Goal: Knowledge of disease or condition will improve 04/12/2024 1549 by Jerilee Lorrayne HERO, RN Outcome: Progressing 04/12/2024 1548 by Jerilee Lorrayne HERO, RN Outcome: Progressing Goal: Knowledge of the prescribed therapeutic regimen will improve 04/12/2024 1549 by Jerilee Lorrayne HERO, RN Outcome: Progressing 04/12/2024 1548 by Jerilee Lorrayne HERO, RN Outcome: Progressing Goal: Individualized Educational Video(s) 04/12/2024 1549 by Jerilee Lorrayne HERO, RN Outcome: Progressing 04/12/2024 1548 by Jerilee Lorrayne HERO, RN Outcome: Progressing   Problem: Activity: Goal: Risk for activity intolerance will decrease 04/12/2024 1549 by Jerilee Lorrayne HERO, RN Outcome: Progressing 04/12/2024 1548 by Jerilee Lorrayne HERO, RN Outcome: Progressing   Problem: Cardiac: Goal: Will achieve and/or maintain hemodynamic stability 04/12/2024  1549 by Jerilee Lorrayne HERO, RN Outcome: Progressing 04/12/2024 1548 by Jerilee Lorrayne HERO, RN Outcome: Progressing   Problem: Clinical Measurements: Goal: Postoperative complications will be avoided or minimized 04/12/2024 1549 by Jerilee, [facility]  M, RN Outcome: Progressing 04/12/2024 1548 by Jerilee Lorrayne HERO, RN Outcome: Progressing   Problem: Respiratory: Goal: Respiratory status will improve 04/12/2024 1549 by Jerilee Lorrayne HERO, RN Outcome: Progressing 04/12/2024 1548 by Jerilee Lorrayne HERO, RN Outcome: Progressing   Problem: Skin Integrity: Goal: Wound healing without signs and symptoms of infection 04/12/2024 1549 by Jerilee Lorrayne HERO, RN Outcome: Progressing 04/12/2024 1548 by Jerilee Lorrayne HERO, RN Outcome: Progressing Goal: Risk for impaired skin integrity will decrease 04/12/2024 1549 by Jerilee Lorrayne HERO, RN Outcome: Progressing 04/12/2024 1548 by Jerilee Lorrayne HERO, RN Outcome: Progressing   Problem: Urinary Elimination: Goal: Ability to achieve and maintain adequate renal perfusion and functioning will improve 04/12/2024 1549 by Jerilee Lorrayne HERO, RN Outcome: Progressing 04/12/2024 1548 by Jerilee Lorrayne HERO, RN Outcome: Progressing

## 2024-04-12 NOTE — Progress Notes (Addendum)
 [address] [employer] [address]             [phone]      2 Days Post-Op Procedure(s) (LRB): CORONARY ARTERY BYPASS GRAFTING (CABG) TIMES THREE USING LEFT INTERNAL MAMMARY ARTERY AND ENDOSCOPICALLY HARVESTED RIGHT GREATER SAPHENOUS VEIN (N/A) ECHOCARDIOGRAM, TRANSESOPHAGEAL, INTRAOPERATIVE (N/A) Subjective: Patient reports he is lightheaded and his pain is a 7/10.   Objective: Vital signs in last 24 hours: Temp:  [98.4 F (36.9 C)-100.4 F (38 C)] 99.1 F (37.3 C) (08/08 0730) Pulse Rate:  [83-119] 103 (08/08 0815) Cardiac Rhythm: Normal sinus rhythm (08/08 0410) Resp:  [14-29] 18 (08/08 0815) BP: (95-163)/(52-95) 95/53 (08/08 0800) SpO2:  [81 %-100 %] 94 % (08/08 0800) Arterial Line BP: (102-135)/(44-55) 135/55 (08/07 1500) Weight:  [887 kg] 112 kg (08/08 0600)  Hemodynamic parameters for last 24 hours: CVP:  [7 mmHg-19 mmHg] 18 mmHg CO:  [6.7 L/min-9.8 L/min] 7.2 L/min CI:  [3.1 L/min/m2-4.6 L/min/m2] 3.4 L/min/m2  Intake/Output from previous day: 08/07 0701 - 08/08 0700 In: 619.5 [P.O.:180; I.V.:154.1; IV Piggyback:285.4] Out: 2080 [Urine:1800; Chest Tube:280] Intake/Output this shift: Total I/O In: 315 [Blood:315] Out: 520 [Urine:500; Chest Tube:20]  General appearance: alert, cooperative, and no distress Neurologic: intact Heart: sinus tachycardia, no murmur Lungs: diminished bibasilar breath sounds Abdomen: hypoactive bowel sounds, nontender, no distension Extremities: edema 1-2+ BLE Wound: Clean and dry dressing in place  Lab Results: Recent Labs    04/11/24 1715 04/12/24 0405  WBC 11.4* 9.6  HGB 7.2* 6.7*  HCT 22.7* 21.2*  PLT 145* 131*   BMET:  Recent Labs    04/11/24 1715 04/12/24 0405  NA 133* 133*  K 4.4 4.2  CL 101 100  CO2 21* 22  GLUCOSE 233* 154*  BUN 30* 30*  CREATININE 2.72* 2.50*  CALCIUM  7.9* 7.9*    PT/INR:  Recent Labs    04/10/24 1438  LABPROT 22.5*  INR 1.9*   ABG    Component  Value Date/Time   PHART 7.305 (L) 04/10/2024 2323   HCO3 22.6 04/10/2024 2323   TCO2 24 04/10/2024 2323   ACIDBASEDEF 3.0 (H) 04/10/2024 2323   O2SAT 98 04/10/2024 2323   CBG (last 3)  Recent Labs    04/11/24 2008 04/11/24 2332 04/12/24 0403  GLUCAP 183* 144* 156*    Assessment/Plan: S/P Procedure(s) (LRB): CORONARY ARTERY BYPASS GRAFTING (CABG) TIMES THREE USING LEFT INTERNAL MAMMARY ARTERY AND ENDOSCOPICALLY HARVESTED RIGHT GREATER SAPHENOUS VEIN (N/A) ECHOCARDIOGRAM, TRANSESOPHAGEAL, INTRAOPERATIVE (N/A)  Neuro: Pain 7/10 but also drowsy, continue current pain regimen. Will likely d/c gabapentin  tomorrow.   CV: Sinus tachycardia, HR 107 this AM. Soft BP, 103/88 this AM on Lopressor  12.5mg  BID. Hx of endarterectomy, on Plavix . Off drips. Arterial line has been d/c'd. May be able to d/c central line.  Pulm: Saturating well on 2L [place]. CT output 280cc/24hrs, only 80cc last shift. Will likely d/c this AM. CXR with bibasilar atelectasis. Encourage IS and ambulation.   GI: Tolerating a diet, some nausea this AM. Passing little gas, continue zofran  prn, continue 1 more day of reglan  and current bowel regimen.   Endo: Uncontrolled T2DM, preop A1C 9.1. On Metformin , Jardiance  and Januvia at home. CBGs controlled on Semglee  18U daily and SSI until this AM CBG 247. 3U mealtime and SSI at bedtime started per critical care.   Renal: CKD stage III baseline cr 2.3-2.6. Cr 2.5 today, down from 2.72 yesterday. UO 1800cc/24hrs. K 4.2, at  goal. +11lbs from preop weight, IV Lasix  40mg  today.   ID: Likely reactive leukocytosis resolved, Tmax 100.4. Clinically monitor.   Expected postop ABLA: H/H 6.7/21.2, will transfuse 1U PRBCs. Reactive thrombocytopenia, plt 131,000 trending down. Monitor.   DVT Prophylaxis: On Plavix  and thrombocytopenia so Lovenox  has been held  Dispo: Continue ICU care for now   LOS: 2 days    Con GORMAN Bend, PA-C 04/12/2024

## 2024-04-12 NOTE — Inpatient Diabetes Management (Signed)
 Inpatient Diabetes Program Recommendations  AACE/ADA: New Consensus Statement on Inpatient Glycemic Control (2015)  Target Ranges:  Prepandial:   less than 140 mg/dL      Peak postprandial:   less than 180 mg/dL (1-2 hours)      Critically ill patients:  140 - 180 mg/dL   Lab Results  Component Value Date   GLUCAP 204 (H) 04/12/2024   HGBA1C 9.1 (H) 04/08/2024    Review of Glycemic Control  Diabetes history: DM2 Outpatient Diabetes medications: metformin  1000 mg BID, Jardiance  10 daily Current orders for Inpatient glycemic control: Lantus  18 daily, Novolog  3 units TID +0-24 TID with meals and  0-5 HS  Inpatient Diabetes Program Recommendations:    Consider increasing Semglee  to 24 units daily  Consider increasing Novolog  to 6 units TID with meals if eating > 50%  F/U with pt at bedside. Has good appetite. States he will think about CGM, does not think he would like something on back of arm. States he has no problem with sticking fingers for blood glucose monitoring. Sees PCP at [facility]. Has appt in 5 weeks, but will call and try to get appt sooner, d/t being in hospital and starting on insulin .   Continue to follow.   Thank you. Shona Brandy, RD, LDN, CDCES Inpatient Diabetes Coordinator [phone]

## 2024-04-12 NOTE — Progress Notes (Signed)
 NAME:  Devon Wood, MRN:  [id number], DOB:  April 23, 1961, LOS: 2 ADMISSION DATE:  04/10/2024, CONSULTATION DATE:  04/10/24 REFERRING MD:  Shyrl- TCTS, CHIEF COMPLAINT:  post-CABG   History of Present Illness:  63 year old male patient with known history of coronary artery disease, hypertension and CKD stage III.  Had previous stent back in January 2025, at which time multivessel coronary artery disease.  He presented to cardiology office on 03/29/24 for evaluation of new chest pain.  He had been treated in the outpatient setting with Brilinta , aspirin , Zetia , lovastatin and metoprolol . He underwent cardiac catheterization on 7/30, demonstrating  multivessel coronary artery disease including restenosis of the proximal mid LAD stents and significant disease involving the circumflex and the RCA.  He was seen by cardiothoracic surgery and was deemed candidate for three-vessel coronary artery bypass grafting. He was transferred to the ICU intubated on neosynephrine.  Paralytics not reversed in OR.   Presented to [facility] for elective CABG on 8/6 Cross clamp: 71 min Bypass time: 112 min EBL:  900cc Cell saver: 462 cc  Pertinent  Medical History  Cardiac artery disease involving the LAD previous stent, circumflex and RCA.  EF 65 to 70%, no regional wall abnormality, hypertension, mixed hyperlipidemia, type 2 diabetes CKD stage III PUD HTN  Significant Hospital Events: Including procedures, antibiotic start and stop dates in addition to other pertinent events   8/6 presented for planned three-vessel CABG , transferred to ICU on neosynephrine and intubated.  Interim History / Subjective:  Off NE. Some nausea this morning when eating. Pain still not great. Afebrile overnight; last fever mid-day yesterday 100.4.  Objective    Blood pressure 128/70, pulse (!) 102, temperature 98.6 F (37 C), temperature source Oral, resp. rate 19, height 5' 6 (1.676 m), weight 112 kg, SpO2 (!) 81%. CVP:  [7 mmHg-19  mmHg] 18 mmHg CO:  [6.7 L/min-9.8 L/min] 7.2 L/min CI:  [3.1 L/min/m2-4.6 L/min/m2] 3.4 L/min/m2      Intake/Output Summary (Last 24 hours) at 04/12/2024 0730 Last data filed at 04/12/2024 0600 Gross per 24 hour  Intake 619.52 ml  Output 2080 ml  Net -1460.48 ml   Filed Weights   04/10/24 0651 04/11/24 0702 04/12/24 0600  Weight: 106.6 kg 111.8 kg 112 kg    Examination: General: chronically ill appearing man sitting up in the chair in NAD HENT: [place]/AT, eyes anicteric Lungs: Breathing comfortably on [place], reduced basilar breath sounds, no rhales.  Thin blood tinged fluid from chest tubes.  Cardiovascular: S1S2, tachycardic, reg rhythm Abdomen: obese, soft, NT Extremities: +edema, no cyanosis Neuro: awake, alert, moving all extremities, answering questions appropriately. Lethargic appearing.   BUN 30 Cr 2.5 WBC 9.6 H/H 6.7/21.2 Platelets 131 CXR personally reviewed> low lung volumes, RIJ CVC, pulm edema.   Resolved problem list   Assessment and Plan  3-vessel CAD with NSTEMI; LVEF 65-70% S/p CABG x 3, required end-arterectomy during procedure -post-op care per TCTS -Start meroprolol today -lasix  40mg  today -pain control per protocol- morphine , tramadol , oxycodone  -con't gabapentin  to augment pain control -aspirin  and statin daily, plavix  post-op -pulmonary hygiene & wean O2 -con't chest tubes -hopefully can d/c [place] & CVC today  HLD -statin, zetia   Hyperglycemia; h/o uncontrolled DM. A1c 9.1 -con't semglee  18 units daily -add mealtime insulin  aspart 3 units TIDAC -SSI TIDAC + QHS -con't holding PTA-jardiance , metformin , sitagliptin. Anticipate he needs to go home on insulin   ABLA- expected post-op Consumptive coagulopathy- expected post-op -1 unit pRBC today -monitor for bleeding  CKD stage IIIb; Baseline serum creatinine 2.3-2.6 -strict I/O -renally dose meds, avoid nephrotoxic meds -maintain adequate perfusion  Hypervolemic hyponatremia -lasix   today -limit free water and low solute fluids  Prolonged Qtc -monitor on tele -avoid QT prolonging meds -monitor electrolytes  Obesity -long term recommend weight loss  Best Practice (right click and Reselect all SmartList Selections daily)   Diet/type: Regular consistency (see orders)  DVT prophylaxis LMWH Pressure ulcer(s): N/A GI prophylaxis: PPI Lines: Central line and yes and it is still needed Foley:  N/A Code Status:  full code Last date of multidisciplinary goals of care discussion [ ]   Labs   CBC: Recent Labs  Lab 04/10/24 1438 04/10/24 1440 04/10/24 2030 04/10/24 2203 04/10/24 2323 04/11/24 0455 04/11/24 1715 04/12/24 0405  WBC 7.4  --  10.7*  --   --  12.4* 11.4* 9.6  HGB 7.9*   < > 8.2* 7.5* 7.8* 8.2* 7.2* 6.7*  HCT 24.1*   < > 24.7* 22.0* 23.0* 25.2* 22.7* 21.2*  MCV 88.0  --  87.9  --   --  89.0 90.4 91.8  PLT 109*  --  152  --   --  180 145* 131*   < > = values in this interval not displayed.    Basic Metabolic Panel: Recent Labs  Lab 04/08/24 1000 04/10/24 0901 04/10/24 1344 04/10/24 1440 04/10/24 2030 04/10/24 2203 04/10/24 2323 04/11/24 0455 04/11/24 1715 04/12/24 0405  NA 134*   < > 138   < > 139 142 142 137 133* 133*  K 4.8   < > 4.8   < > 5.0 4.5 4.6 4.6 4.4 4.2  CL 103   < > 104  --  108  --   --  105 101 100  CO2 22  --   --   --  22  --   --  23 21* 22  GLUCOSE 196*   < > 210*  --  133*  --   --  141* 233* 154*  BUN 38*   < > 34*  --  30*  --   --  28* 30* 30*  CREATININE 2.32*   < > 1.80*  --  1.96*  --   --  2.18* 2.72* 2.50*  CALCIUM  8.9  --   --   --  8.0*  --   --  8.1* 7.9* 7.9*  MG  --   --   --   --  3.2*  --   --  2.8* 2.5*  --    < > = values in this interval not displayed.   GFR: Estimated Creatinine Clearance: 36.5 mL/min (A) (by C-G formula based on SCr of 2.5 mg/dL (H)). Recent Labs  Lab 04/10/24 2030 04/11/24 0455 04/11/24 1715 04/12/24 0405  WBC 10.7* 12.4* 11.4* 9.6    Liver Function Tests: Recent  Labs  Lab 04/08/24 1000  AST 28  ALT 26  ALKPHOS 51  BILITOT 0.7  PROT 7.7  ALBUMIN  4.1   Critical care time:        Leita SHAUNNA Gaskins, DO 04/12/24 8:29 AM [facility]  For contact information, see Amion. If no response to pager, please call PCCM consult pager. After hours, 7PM- 7AM, please call Elink.

## 2024-04-13 LAB — BASIC METABOLIC PANEL WITH GFR
Anion gap: 10 (ref 5–15)
BUN: 33 mg/dL — ABNORMAL HIGH (ref 8–23)
CO2: 20 mmol/L — ABNORMAL LOW (ref 22–32)
Calcium: 7.8 mg/dL — ABNORMAL LOW (ref 8.9–10.3)
Chloride: 101 mmol/L (ref 98–111)
Creatinine, Ser: 2.5 mg/dL — ABNORMAL HIGH (ref 0.61–1.24)
GFR, Estimated: 29 mL/min — ABNORMAL LOW (ref >60)
Glucose, Bld: 163 mg/dL — ABNORMAL HIGH (ref 70–99)
Potassium: 3.7 mmol/L (ref 3.5–5.1)
Sodium: 131 mmol/L — ABNORMAL LOW (ref 135–145)

## 2024-04-13 LAB — CBC
HCT: 21.6 % — ABNORMAL LOW (ref 39.0–52.0)
Hemoglobin: 7.2 g/dL — ABNORMAL LOW (ref 13.0–17.0)
MCH: 29.6 pg (ref 26.0–34.0)
MCHC: 33.3 g/dL (ref 30.0–36.0)
MCV: 88.9 fL (ref 80.0–100.0)
Platelets: 124 K/uL — ABNORMAL LOW (ref 150–400)
RBC: 2.43 MIL/uL — ABNORMAL LOW (ref 4.22–5.81)
RDW: 13.4 % (ref 11.5–15.5)
WBC: 9 K/uL (ref 4.0–10.5)
nRBC: 0 % (ref 0.0–0.2)

## 2024-04-13 LAB — GLUCOSE, CAPILLARY
Glucose-Capillary: 146 mg/dL — ABNORMAL HIGH (ref 70–99)
Glucose-Capillary: 181 mg/dL — ABNORMAL HIGH (ref 70–99)
Glucose-Capillary: 242 mg/dL — ABNORMAL HIGH (ref 70–99)
Glucose-Capillary: 213 mg/dL — ABNORMAL HIGH (ref 70–99)

## 2024-04-13 MED ORDER — METOPROLOL TARTRATE 25 MG PO TABS
25.0000 mg | ORAL_TABLET | Freq: Two times a day (BID) | ORAL | Status: DC
  Administered 2024-04-13 (×2): 25 mg via ORAL
  Filled 2024-04-13 (×2): qty 1

## 2024-04-13 MED ORDER — POTASSIUM CHLORIDE CRYS ER 20 MEQ PO TBCR
40.0000 meq | EXTENDED_RELEASE_TABLET | Freq: Every day | ORAL | Status: DC
  Administered 2024-04-13 – 2024-04-14 (×2): 40 meq via ORAL
  Filled 2024-04-13 (×2): qty 2

## 2024-04-13 MED ORDER — INSULIN ASPART 100 UNIT/ML IJ SOLN
6.0000 [IU] | Freq: Three times a day (TID) | INTRAMUSCULAR | Status: DC
  Administered 2024-04-13 – 2024-04-14 (×5): 6 [IU] via SUBCUTANEOUS

## 2024-04-13 MED ORDER — FUROSEMIDE 10 MG/ML IJ SOLN
40.0000 mg | Freq: Once | INTRAMUSCULAR | Status: AC
  Administered 2024-04-13: 40 mg via INTRAVENOUS
  Filled 2024-04-13: qty 4

## 2024-04-13 MED ORDER — LACTULOSE 10 GM/15ML PO SOLN: 20.0000 g | Freq: Two times a day (BID) | ORAL | Status: DC | PRN

## 2024-04-13 NOTE — Progress Notes (Addendum)
 [address] [place] [employer] [address]             [phone]      3 Days Post-Op Procedure(s) (LRB): CORONARY ARTERY BYPASS GRAFTING (CABG) TIMES THREE USING LEFT INTERNAL MAMMARY ARTERY AND ENDOSCOPICALLY HARVESTED RIGHT GREATER SAPHENOUS VEIN (N/A) ECHOCARDIOGRAM, TRANSESOPHAGEAL, INTRAOPERATIVE (N/A) Subjective: Patient reports he is feeling a lot better than yeserday. Denies lightheadedness but does report some pain to the right of his sternal incision and around his EVH site.   Objective: Vital signs in last 24 hours: Temp:  [98.4 F (36.9 C)-99.9 F (37.7 C)] 99.2 F (37.3 C) (08/09 0344) Pulse Rate:  [80-109] 109 (08/09 0344) Cardiac Rhythm: Sinus tachycardia (08/08 2100) Resp:  [12-27] 18 (08/09 0344) BP: (95-145)/(53-81) 145/78 (08/09 0344) SpO2:  [92 %-100 %] 100 % (08/09 0344) Weight:  [110.6 kg] 110.6 kg (08/09 0500)  Hemodynamic parameters for last 24 hours:    Intake/Output from previous day: 08/08 0701 - 08/09 0700 In: 1350.2 [P.O.:720; I.V.:0.2; Blood:630] Out: 2940 [Urine:2900; Chest Tube:40] Intake/Output this shift: No intake/output data recorded.  General appearance: alert, cooperative, and no distress Neurologic: intact Heart: sinus tachycardia, no murmur Lungs: slightly diminished bibasilar breath sounds Abdomen: soft, non-tender; bowel sounds normal; no masses,  no organomegaly Extremities: edema 2+ BLE Wound: Clean and dry without sign of infection  Lab Results: Recent Labs    04/12/24 0405 04/12/24 1325 04/13/24 0318  WBC 9.6  --  9.0  HGB 6.7* 7.7* 7.2*  HCT 21.2* 23.6* 21.6*  PLT 131*  --  124*   BMET:  Recent Labs    04/12/24 1325 04/13/24 0318  NA 130* 131*  K 4.1 3.7  CL 97* 101  CO2 21* 20*  GLUCOSE 253* 163*  BUN 33* 33*  CREATININE 2.61* 2.50*  CALCIUM  7.7* 7.8*    PT/INR:  Recent Labs    04/10/24 1438  LABPROT 22.5*  INR 1.9*   ABG    Component Value Date/Time   PHART 7.305 (L)  04/10/2024 2323   HCO3 22.6 04/10/2024 2323   TCO2 24 04/10/2024 2323   ACIDBASEDEF 3.0 (H) 04/10/2024 2323   O2SAT 98 04/10/2024 2323   CBG (last 3)  Recent Labs    04/12/24 1624 04/12/24 2128 04/13/24 0624  GLUCAP 204* 185* 146*    Assessment/Plan: S/P Procedure(s) (LRB): CORONARY ARTERY BYPASS GRAFTING (CABG) TIMES THREE USING LEFT INTERNAL MAMMARY ARTERY AND ENDOSCOPICALLY HARVESTED RIGHT GREATER SAPHENOUS VEIN (N/A) ECHOCARDIOGRAM, TRANSESOPHAGEAL, INTRAOPERATIVE (N/A)  Neuro: Pain better controlled this AM. Will d/c gabapentin  today as discussed with critical care yesterday and continue current pain regimen   CV: Sinus tachycardia, HR 105 this AM. SBP mostly 110s-120s but BP 145/78 this AM. On Lopressor  12.5mg  BID, will titrate. Hx of endarterectomy, on Plavix .    Pulm: Saturating well on 2L [place] this AM, on RA at times. Last CXR with patchy opacities likely atelectasis. Encourage IS and ambulation. Wean oxygen as able for O2>90%   GI: Tolerating a diet, no nausea this AM. -BM. Passing a lot of gas.    Endo: Uncontrolled T2DM, preop A1C 9.1. On Metformin , Jardiance  and Januvia at home. CBGs elevated around mealtimes but otherwise controlled on Semglee  18U daily, 3U mealtime Novolog , and SSI. Will increase mealtime Novolog  to 6U. Mild hyponatremia, improved from yesterday.    Renal: CKD stage III baseline Cr 2.3-2.6. Cr 2.5 today, stable. UO 2900cc/24hrs. K 3.7, supplement. +8lbs from preop weight, was  given IV Lasix  40mg  yesterday. Still very edematous on exam, will give another dose of IV Lasix  today since creatinine is stable. Will add TED hose and elevate legs.    ID: Likely reactive leukocytosis resolved, Tmax 99.9. Clinically monitor.    Expected postop ABLA: H/H 7.2/21.6 after 1U PRBCs transfusion yesterday. Reactive thrombocytopenia, plt 124,000 trending down. Monitor.    DVT Prophylaxis: On Plavix  and thrombocytopenia so Lovenox  has been held   Dispo: Dispo planning,  hopefully can d/c home early next week   LOS: 3 days    Con GORMAN Bend, PA-C 04/13/2024   Agree  Doing well diuresing Dispo planning  Monroe Qin MALVA Rayas

## 2024-04-13 NOTE — Progress Notes (Signed)
 CARDIAC REHAB PHASE I    Pt sitting in chair, feeling well today. Reports ambulating independently, tolerating well.   Post OHS education including site care, restrictions, heart healthy diabetic diet, sternal precautions, IS use at home, home needs at discharge, exercise guidelines and CRP2 reviewed. All questions and concerns addressed. Will refer to [facility] for CRP2. Will continue to follow.   [id number] Vaughn Asberry Hacking, RN BSN 04/13/2024 10:14 AM

## 2024-04-13 NOTE — Plan of Care (Signed)
 Problem: Education: Goal: Knowledge of General Education information will improve Description: Including pain rating scale, medication(s)/side effects and non-pharmacologic comfort measures Outcome: Progressing   Problem: Health Behavior/Discharge Planning: Goal: Ability to manage health-related needs will improve Outcome: Progressing   Problem: Clinical Measurements: Goal: Ability to maintain clinical measurements within normal limits will improve Outcome: Progressing Goal: Will remain free from infection Outcome: Progressing Goal: Diagnostic test results will improve Outcome: Progressing Goal: Respiratory complications will improve Outcome: Progressing Goal: Cardiovascular complication will be avoided Outcome: Progressing   Problem: Activity: Goal: Risk for activity intolerance will decrease Outcome: Progressing   Problem: Nutrition: Goal: Adequate nutrition will be maintained Outcome: Progressing   Problem: Coping: Goal: Level of anxiety will decrease Outcome: Progressing   Problem: Elimination: Goal: Will not experience complications related to bowel motility Outcome: Progressing Goal: Will not experience complications related to urinary retention Outcome: Progressing   Problem: Pain Managment: Goal: General experience of comfort will improve and/or be controlled Outcome: Progressing   Problem: Safety: Goal: Ability to remain free from injury will improve Outcome: Progressing   Problem: Skin Integrity: Goal: Risk for impaired skin integrity will decrease Outcome: Progressing   Problem: Education: Goal: Will demonstrate proper wound care and an understanding of methods to prevent future damage Outcome: Progressing Goal: Knowledge of disease or condition will improve Outcome: Progressing Goal: Knowledge of the prescribed therapeutic regimen will improve Outcome: Progressing Goal: Individualized Educational Video(s) Outcome: Progressing   Problem:  Activity: Goal: Risk for activity intolerance will decrease Outcome: Progressing   Problem: Cardiac: Goal: Will achieve and/or maintain hemodynamic stability Outcome: Progressing   Problem: Clinical Measurements: Goal: Postoperative complications will be avoided or minimized Outcome: Progressing   Problem: Respiratory: Goal: Respiratory status will improve Outcome: Progressing   Problem: Skin Integrity: Goal: Wound healing without signs and symptoms of infection Outcome: Progressing Goal: Risk for impaired skin integrity will decrease Outcome: Progressing   Problem: Urinary Elimination: Goal: Ability to achieve and maintain adequate renal perfusion and functioning will improve Outcome: Progressing   Problem: Education: Goal: Ability to describe self-care measures that may prevent or decrease complications (Diabetes Survival Skills Education) will improve Outcome: Progressing Goal: Individualized Educational Video(s) Outcome: Progressing   Problem: Coping: Goal: Ability to adjust to condition or change in health will improve Outcome: Progressing   Problem: Fluid Volume: Goal: Ability to maintain a balanced intake and output will improve Outcome: Progressing   Problem: Health Behavior/Discharge Planning: Goal: Ability to identify and utilize available resources and services will improve Outcome: Progressing Goal: Ability to manage health-related needs will improve Outcome: Progressing   Problem: Metabolic: Goal: Ability to maintain appropriate glucose levels will improve Outcome: Progressing   Problem: Nutritional: Goal: Maintenance of adequate nutrition will improve Outcome: Progressing Goal: Progress toward achieving an optimal weight will improve Outcome: Progressing   Problem: Skin Integrity: Goal: Risk for impaired skin integrity will decrease Outcome: Progressing   Problem: Tissue Perfusion: Goal: Adequacy of tissue perfusion will improve Outcome:  Progressing   Problem: Education: Goal: Ability to describe self-care measures that may prevent or decrease complications (Diabetes Survival Skills Education) will improve Outcome: Progressing Goal: Individualized Educational Video(s) Outcome: Progressing   Problem: Coping: Goal: Ability to adjust to condition or change in health will improve Outcome: Progressing   Problem: Fluid Volume: Goal: Ability to maintain a balanced intake and output will improve Outcome: Progressing   Problem: Health Behavior/Discharge Planning: Goal: Ability to identify and utilize available resources and services will improve Outcome: Progressing Goal: Ability to manage  health-related needs will improve Outcome: Progressing   Problem: Metabolic: Goal: Ability to maintain appropriate glucose levels will improve Outcome: Progressing   Problem: Nutritional: Goal: Maintenance of adequate nutrition will improve Outcome: Progressing Goal: Progress toward achieving an optimal weight will improve Outcome: Progressing

## 2024-04-13 NOTE — Plan of Care (Signed)

## 2024-04-14 ENCOUNTER — Inpatient Hospital Stay (HOSPITAL_COMMUNITY)

## 2024-04-14 LAB — GLUCOSE, CAPILLARY
Glucose-Capillary: 176 mg/dL — ABNORMAL HIGH (ref 70–99)
Glucose-Capillary: 229 mg/dL — ABNORMAL HIGH (ref 70–99)
Glucose-Capillary: 193 mg/dL — ABNORMAL HIGH (ref 70–99)
Glucose-Capillary: 267 mg/dL — ABNORMAL HIGH (ref 70–99)

## 2024-04-14 LAB — BASIC METABOLIC PANEL WITH GFR
Anion gap: 13 (ref 5–15)
BUN: 35 mg/dL — ABNORMAL HIGH (ref 8–23)
CO2: 19 mmol/L — ABNORMAL LOW (ref 22–32)
Calcium: 8.4 mg/dL — ABNORMAL LOW (ref 8.9–10.3)
Chloride: 105 mmol/L (ref 98–111)
Creatinine, Ser: 2.34 mg/dL — ABNORMAL HIGH (ref 0.61–1.24)
GFR, Estimated: 31 mL/min — ABNORMAL LOW (ref >60)
Glucose, Bld: 185 mg/dL — ABNORMAL HIGH (ref 70–99)
Potassium: 4.4 mmol/L (ref 3.5–5.1)
Sodium: 137 mmol/L (ref 135–145)

## 2024-04-14 LAB — CBC
HCT: 25.3 % — ABNORMAL LOW (ref 39.0–52.0)
Hemoglobin: 8.5 g/dL — ABNORMAL LOW (ref 13.0–17.0)
MCH: 29.8 pg (ref 26.0–34.0)
MCHC: 33.6 g/dL (ref 30.0–36.0)
MCV: 88.8 fL (ref 80.0–100.0)
Platelets: 198 K/uL (ref 150–400)
RBC: 2.85 MIL/uL — ABNORMAL LOW (ref 4.22–5.81)
RDW: 13.8 % (ref 11.5–15.5)
WBC: 8.5 K/uL (ref 4.0–10.5)
nRBC: 0 % (ref 0.0–0.2)

## 2024-04-14 MED ORDER — METOPROLOL TARTRATE 25 MG PO TABS
37.5000 mg | ORAL_TABLET | Freq: Two times a day (BID) | ORAL | Status: DC
  Administered 2024-04-14 – 2024-04-15 (×4): 37.5 mg via ORAL
  Filled 2024-04-14 (×3): qty 1

## 2024-04-14 MED ORDER — INSULIN GLARGINE-YFGN 100 UNIT/ML ~~LOC~~ SOLN
20.0000 [IU] | Freq: Every day | SUBCUTANEOUS | Status: DC
  Administered 2024-04-14 – 2024-04-15 (×3): 20 [IU] via SUBCUTANEOUS
  Filled 2024-04-14 (×2): qty 0.2

## 2024-04-14 MED ORDER — FUROSEMIDE 10 MG/ML IJ SOLN
40.0000 mg | Freq: Once | INTRAMUSCULAR | Status: AC
  Administered 2024-04-14: 40 mg via INTRAVENOUS
  Filled 2024-04-14: qty 4

## 2024-04-14 MED ORDER — FE FUM-VIT C-VIT B12-FA 460-60-0.01-1 MG PO CAPS
1.0000 | ORAL_CAPSULE | Freq: Every day | ORAL | Status: DC
  Administered 2024-04-14 – 2024-04-15 (×3): 1 via ORAL
  Filled 2024-04-14 (×2): qty 1

## 2024-04-14 MED ORDER — ORAL CARE MOUTH RINSE: 15.0000 mL | OROMUCOSAL | Status: DC | PRN

## 2024-04-14 MED ORDER — POTASSIUM CHLORIDE CRYS ER 20 MEQ PO TBCR
20.0000 meq | EXTENDED_RELEASE_TABLET | Freq: Every day | ORAL | Status: DC
  Administered 2024-04-15 (×2): 20 meq via ORAL
  Filled 2024-04-14: qty 1

## 2024-04-14 NOTE — Plan of Care (Signed)
 Problem: Education: Goal: Knowledge of General Education information will improve Description: Including pain rating scale, medication(s)/side effects and non-pharmacologic comfort measures Outcome: Progressing   Problem: Health Behavior/Discharge Planning: Goal: Ability to manage health-related needs will improve Outcome: Progressing   Problem: Clinical Measurements: Goal: Ability to maintain clinical measurements within normal limits will improve Outcome: Progressing Goal: Will remain free from infection Outcome: Progressing Goal: Diagnostic test results will improve Outcome: Progressing Goal: Respiratory complications will improve Outcome: Progressing Goal: Cardiovascular complication will be avoided Outcome: Progressing   Problem: Activity: Goal: Risk for activity intolerance will decrease Outcome: Progressing   Problem: Nutrition: Goal: Adequate nutrition will be maintained Outcome: Progressing   Problem: Coping: Goal: Level of anxiety will decrease Outcome: Progressing   Problem: Elimination: Goal: Will not experience complications related to bowel motility Outcome: Progressing Goal: Will not experience complications related to urinary retention Outcome: Progressing   Problem: Pain Managment: Goal: General experience of comfort will improve and/or be controlled Outcome: Progressing   Problem: Safety: Goal: Ability to remain free from injury will improve Outcome: Progressing   Problem: Skin Integrity: Goal: Risk for impaired skin integrity will decrease Outcome: Progressing   Problem: Education: Goal: Will demonstrate proper wound care and an understanding of methods to prevent future damage Outcome: Progressing Goal: Knowledge of disease or condition will improve Outcome: Progressing Goal: Knowledge of the prescribed therapeutic regimen will improve Outcome: Progressing Goal: Individualized Educational Video(s) Outcome: Progressing   Problem:  Activity: Goal: Risk for activity intolerance will decrease Outcome: Progressing   Problem: Cardiac: Goal: Will achieve and/or maintain hemodynamic stability Outcome: Progressing   Problem: Clinical Measurements: Goal: Postoperative complications will be avoided or minimized Outcome: Progressing   Problem: Respiratory: Goal: Respiratory status will improve Outcome: Progressing   Problem: Skin Integrity: Goal: Wound healing without signs and symptoms of infection Outcome: Progressing Goal: Risk for impaired skin integrity will decrease Outcome: Progressing   Problem: Urinary Elimination: Goal: Ability to achieve and maintain adequate renal perfusion and functioning will improve Outcome: Progressing   Problem: Education: Goal: Ability to describe self-care measures that may prevent or decrease complications (Diabetes Survival Skills Education) will improve Outcome: Progressing Goal: Individualized Educational Video(s) Outcome: Progressing   Problem: Coping: Goal: Ability to adjust to condition or change in health will improve Outcome: Progressing   Problem: Fluid Volume: Goal: Ability to maintain a balanced intake and output will improve Outcome: Progressing   Problem: Health Behavior/Discharge Planning: Goal: Ability to identify and utilize available resources and services will improve Outcome: Progressing Goal: Ability to manage health-related needs will improve Outcome: Progressing   Problem: Metabolic: Goal: Ability to maintain appropriate glucose levels will improve Outcome: Progressing   Problem: Nutritional: Goal: Maintenance of adequate nutrition will improve Outcome: Progressing Goal: Progress toward achieving an optimal weight will improve Outcome: Progressing   Problem: Skin Integrity: Goal: Risk for impaired skin integrity will decrease Outcome: Progressing   Problem: Tissue Perfusion: Goal: Adequacy of tissue perfusion will improve Outcome:  Progressing   Problem: Education: Goal: Ability to describe self-care measures that may prevent or decrease complications (Diabetes Survival Skills Education) will improve Outcome: Progressing Goal: Individualized Educational Video(s) Outcome: Progressing   Problem: Coping: Goal: Ability to adjust to condition or change in health will improve Outcome: Progressing   Problem: Fluid Volume: Goal: Ability to maintain a balanced intake and output will improve Outcome: Progressing   Problem: Health Behavior/Discharge Planning: Goal: Ability to identify and utilize available resources and services will improve Outcome: Progressing Goal: Ability to manage  health-related needs will improve Outcome: Progressing   Problem: Metabolic: Goal: Ability to maintain appropriate glucose levels will improve Outcome: Progressing   Problem: Nutritional: Goal: Maintenance of adequate nutrition will improve Outcome: Progressing Goal: Progress toward achieving an optimal weight will improve Outcome: Progressing

## 2024-04-14 NOTE — Progress Notes (Addendum)
 [address] [place] [employer] [address]             [phone]      4 Days Post-Op Procedure(s) (LRB): CORONARY ARTERY BYPASS GRAFTING (CABG) TIMES THREE USING LEFT INTERNAL MAMMARY ARTERY AND ENDOSCOPICALLY HARVESTED RIGHT GREATER SAPHENOUS VEIN (N/A) ECHOCARDIOGRAM, TRANSESOPHAGEAL, INTRAOPERATIVE (N/A) Subjective: Patient reports he feels better after having a bowel movement and his legs are feeling better. He does report his right leg giving out on him during ambulation yesterday.   Objective: Vital signs in last 24 hours: Temp:  [99 F (37.2 C)-100.1 F (37.8 C)] 99.6 F (37.6 C) (08/10 0341) Pulse Rate:  [91-104] 98 (08/10 0341) Cardiac Rhythm: Sinus tachycardia (08/09 1940) Resp:  [17-18] 18 (08/10 0341) BP: (113-135)/(63-71) 130/71 (08/10 0341) SpO2:  [94 %-100 %] 100 % (08/10 0341) Weight:  [109.3 kg] 109.3 kg (08/10 0500)  Hemodynamic parameters for last 24 hours:    Intake/Output from previous day: 08/09 0701 - 08/10 0700 In: 960 [P.O.:960] Out: -  Intake/Output this shift: No intake/output data recorded.  General appearance: alert, cooperative, and no distress Neurologic: intact Heart: sinus tachycardia, no murmur Lungs: diminished bibasilar breath sounds Abdomen: soft, non-tender; bowel sounds normal; no masses,  no organomegaly Extremities: edema 1+ BLE Wound: Clean and dry without sign of infection  Lab Results: Recent Labs    04/12/24 0405 04/12/24 1325 04/13/24 0318  WBC 9.6  --  9.0  HGB 6.7* 7.7* 7.2*  HCT 21.2* 23.6* 21.6*  PLT 131*  --  124*   BMET:  Recent Labs    04/13/24 0318 04/14/24 0335  NA 131* 137  K 3.7 4.4  CL 101 105  CO2 20* 19*  GLUCOSE 163* 185*  BUN 33* 35*  CREATININE 2.50* 2.34*  CALCIUM  7.8* 8.4*    PT/INR: No results for input(s): LABPROT, INR in the last 72 hours. ABG    Component Value Date/Time   PHART 7.305 (L) 04/10/2024 2323   HCO3 22.6 04/10/2024 2323   TCO2 24  04/10/2024 2323   ACIDBASEDEF 3.0 (H) 04/10/2024 2323   O2SAT 98 04/10/2024 2323   CBG (last 3)  Recent Labs    04/13/24 1603 04/13/24 2108 04/14/24 0610  GLUCAP 242* 213* 176*    Assessment/Plan: S/P Procedure(s) (LRB): CORONARY ARTERY BYPASS GRAFTING (CABG) TIMES THREE USING LEFT INTERNAL MAMMARY ARTERY AND ENDOSCOPICALLY HARVESTED RIGHT GREATER SAPHENOUS VEIN (N/A) ECHOCARDIOGRAM, TRANSESOPHAGEAL, INTRAOPERATIVE (N/A)  Neuro: Pain controlled this AM. Continue current pain regimen   CV: Sinus tachycardia with 1st degree AVB, HR 105-110 this AM. SBP mostly 120s-130s On Lopressor  12.5mg  BID, will titrate to Lopressor  37.5mg  BID. Hx of endarterectomy, on Plavix .    Pulm: Saturating well on RA. CXR with low lung volumes and bibasilar atelectasis. Coughing up clear phlegm which is normal for him. Encourage IS and ambulation.    GI: Tolerating a diet, no nausea this AM. +BM.   Endo: Uncontrolled T2DM, preop A1C 9.1. On Metformin , Jardiance  and Januvia at home. CBGs elevated into the 200s a couple of times on Semglee  18U daily, 6U mealtime Novolog , and SSI. Will increase Semglee  to 20U daily. Mild hyponatremia, resolved   Renal: CKD stage III baseline Cr 2.3-2.6. Cr 2.34 today, stable at baseline. No UO recorded. K 4.4, at goal, continue supplementation. +5lbs from preop weight, was given IV Lasix  40mg  yesterday. Still very edematous on exam, will give another dose of IV Lasix  today since creatinine is stable.  Continue TED hose and elevate legs.    ID: Likely reactive leukocytosis resolved, Tmax 100.1. Clinically monitor.    Expected postop ABLA: Last H/H 7.2/21.6 after 1U PRBCs transfusion. Reactive thrombocytopenia, plt 124,000 trending down. Monitor. CBC ordered but not collected, CBC has been reordered. Now that he has had a BM will start iron supplement.    DVT Prophylaxis: On Plavix  and thrombocytopenia so Lovenox  has been held   Dispo: Dispo planning, hopefully can d/c tomorrow  if he continues to diurese well and can improve heart rate control.     LOS: 4 days    Con GORMAN Bend, PA-C 04/14/2024  Agree Doing well Dispo planning  Devon Wood

## 2024-04-15 ENCOUNTER — Other Ambulatory Visit (HOSPITAL_COMMUNITY): Payer: Self-pay

## 2024-04-15 LAB — CBC
HCT: 24.2 % — ABNORMAL LOW (ref 39.0–52.0)
Hemoglobin: 7.9 g/dL — ABNORMAL LOW (ref 13.0–17.0)
MCH: 29.3 pg (ref 26.0–34.0)
MCHC: 32.6 g/dL (ref 30.0–36.0)
MCV: 89.6 fL (ref 80.0–100.0)
Platelets: 192 K/uL (ref 150–400)
RBC: 2.7 MIL/uL — ABNORMAL LOW (ref 4.22–5.81)
RDW: 13.9 % (ref 11.5–15.5)
WBC: 9 K/uL (ref 4.0–10.5)
nRBC: 0 % (ref 0.0–0.2)

## 2024-04-15 LAB — GLUCOSE, CAPILLARY: Glucose-Capillary: 124 mg/dL — ABNORMAL HIGH (ref 70–99)

## 2024-04-15 LAB — BASIC METABOLIC PANEL WITH GFR
Anion gap: 10 (ref 5–15)
BUN: 37 mg/dL — ABNORMAL HIGH (ref 8–23)
CO2: 24 mmol/L (ref 22–32)
Calcium: 8.5 mg/dL — ABNORMAL LOW (ref 8.9–10.3)
Chloride: 102 mmol/L (ref 98–111)
Creatinine, Ser: 2.3 mg/dL — ABNORMAL HIGH (ref 0.61–1.24)
GFR, Estimated: 32 mL/min — ABNORMAL LOW (ref >60)
Glucose, Bld: 151 mg/dL — ABNORMAL HIGH (ref 70–99)
Potassium: 4.2 mmol/L (ref 3.5–5.1)
Sodium: 136 mmol/L (ref 135–145)

## 2024-04-15 MED ORDER — FUROSEMIDE 40 MG PO TABS
40.0000 mg | ORAL_TABLET | Freq: Every day | ORAL | 1 refills | Status: DC
  Filled 2024-04-15: qty 30, 30d supply, fill #0

## 2024-04-15 MED ORDER — POTASSIUM CHLORIDE CRYS ER 20 MEQ PO TBCR
20.0000 meq | EXTENDED_RELEASE_TABLET | Freq: Every day | ORAL | 1 refills | Status: DC
  Filled 2024-04-15: qty 30, 30d supply, fill #0

## 2024-04-15 MED ORDER — METOPROLOL TARTRATE 25 MG PO TABS
37.5000 mg | ORAL_TABLET | Freq: Two times a day (BID) | ORAL | 1 refills | Status: DC
  Filled 2024-04-15 – 2024-06-28 (×2): qty 270, 90d supply, fill #0

## 2024-04-15 MED ORDER — FE FUM-VIT C-VIT B12-FA 460-60-0.01-1 MG PO CAPS: 1.0000 | ORAL_CAPSULE | Freq: Every day | ORAL | Status: AC

## 2024-04-15 MED ORDER — FUROSEMIDE 40 MG PO TABS
40.0000 mg | ORAL_TABLET | Freq: Every day | ORAL | 0 refills | Status: DC
  Filled 2024-04-15: qty 5, 5d supply, fill #0

## 2024-04-15 MED ORDER — CLOPIDOGREL BISULFATE 75 MG PO TABS
75.0000 mg | ORAL_TABLET | Freq: Every day | ORAL | 1 refills | Status: DC
  Filled 2024-04-15 – 2024-05-10 (×2): qty 30, 30d supply, fill #0

## 2024-04-15 MED ORDER — ACETAMINOPHEN 325 MG PO TABS: 650.0000 mg | ORAL_TABLET | Freq: Four times a day (QID) | ORAL | Status: DC | PRN

## 2024-04-15 MED ORDER — POTASSIUM CHLORIDE CRYS ER 20 MEQ PO TBCR
20.0000 meq | EXTENDED_RELEASE_TABLET | Freq: Every day | ORAL | 0 refills | Status: DC
  Filled 2024-04-15: qty 5, 5d supply, fill #0

## 2024-04-15 MED ORDER — TRAMADOL HCL 50 MG PO TABS
50.0000 mg | ORAL_TABLET | Freq: Four times a day (QID) | ORAL | 0 refills | Status: AC | PRN
  Filled 2024-04-15: qty 28, 7d supply, fill #0

## 2024-04-15 MED ORDER — ESCITALOPRAM OXALATE 10 MG PO TABS
10.0000 mg | ORAL_TABLET | Freq: Every day | ORAL | 1 refills | Status: AC
  Filled 2024-04-15: qty 30, 30d supply, fill #0

## 2024-04-15 NOTE — Progress Notes (Signed)
 CARDIAC REHAB PHASE I   PRE:  Rate/Rhythm: 100 ST  BP:  Sitting: 136/73      SpO2: 100 ra  MODE:  Ambulation: 320 ft    POST:  Rate/Rhythm: 110 ST  BP:  Sitting: 153/77      SpO2: 100 RA  Pt stood independently following SP and walked in the hallway using  RW. Pt walked well and returned to chair in room without abnormal s/s. Pt denies questions from education on Saturday.   Devon FORBES Candy  MS, ACSM-CEP 9:27 AM 04/15/2024    Service time is from 0913 to 0927.

## 2024-04-15 NOTE — Progress Notes (Addendum)
 [address] [employer] [address]             [phone]      5 Days Post-Op Procedure(s) (LRB): CORONARY ARTERY BYPASS GRAFTING (CABG) TIMES THREE USING LEFT INTERNAL MAMMARY ARTERY AND ENDOSCOPICALLY HARVESTED RIGHT GREATER SAPHENOUS VEIN (N/A) ECHOCARDIOGRAM, TRANSESOPHAGEAL, INTRAOPERATIVE (N/A) Subjective: Patient states he feels great this AM.   Objective: Vital signs in last 24 hours: Temp:  [98 F (36.7 C)-99 F (37.2 C)] 98.2 F (36.8 C) (08/11 0449) Pulse Rate:  [80-100] 80 (08/11 0000) Cardiac Rhythm: Sinus tachycardia (08/10 2000) Resp:  [14-23] 23 (08/10 2057) BP: (103-128)/(67-74) 128/73 (08/11 0449) SpO2:  [95 %-100 %] 95 % (08/11 0000)  Hemodynamic parameters for last 24 hours:    Intake/Output from previous day: 08/10 0701 - 08/11 0700 In: 240 [P.O.:240] Out: -  Intake/Output this shift: No intake/output data recorded.  General appearance: alert, cooperative, and no distress Neurologic: intact Heart: regular rate and rhythm, S1, S2 normal, no murmur, click, rub or gallop Lungs: diminished bibasilar breath sounds Abdomen: soft, non-tender; bowel sounds normal; no masses,  no organomegaly Extremities: edema 1+ BLE improved from yesterday Wound: Clean and dry without sign of infection  Lab Results: Recent Labs    04/14/24 1045 04/15/24 0506  WBC 8.5 9.0  HGB 8.5* 7.9*  HCT 25.3* 24.2*  PLT 198 192   BMET:  Recent Labs    04/14/24 0335 04/15/24 0506  NA 137 136  K 4.4 4.2  CL 105 102  CO2 19* 24  GLUCOSE 185* 151*  BUN 35* 37*  CREATININE 2.34* 2.30*  CALCIUM  8.4* 8.5*    PT/INR: No results for input(s): LABPROT, INR in the last 72 hours. ABG    Component Value Date/Time   PHART 7.305 (L) 04/10/2024 2323   HCO3 22.6 04/10/2024 2323   TCO2 24 04/10/2024 2323   ACIDBASEDEF 3.0 (H) 04/10/2024 2323   O2SAT 98 04/10/2024 2323   CBG (last 3)  Recent Labs    04/14/24 1553 04/14/24 2028  04/15/24 0638  GLUCAP 193* 267* 124*    Assessment/Plan: S/P Procedure(s) (LRB): CORONARY ARTERY BYPASS GRAFTING (CABG) TIMES THREE USING LEFT INTERNAL MAMMARY ARTERY AND ENDOSCOPICALLY HARVESTED RIGHT GREATER SAPHENOUS VEIN (N/A) ECHOCARDIOGRAM, TRANSESOPHAGEAL, INTRAOPERATIVE (N/A)  Neuro: Pain controlled this AM. Continue current pain regimen.   CV: NSR, HR 80s. SBP mostly 120s On Lopressor  37.5mg  BID. Hx of endarterectomy, on Plavix .    Pulm: Saturating well on RA. CXR with low lung volumes and bibasilar atelectasis. Coughing up clear phlegm which is normal for him. Encourage IS and ambulation.    GI: Tolerating a diet, no nausea this AM. +BM.   Endo: Uncontrolled T2DM, preop A1C 9.1. On Metformin , Jardiance  and Januvia at home. CBGs elevated into the 200s a couple of times on Semglee  20U daily, 6U mealtime Novolog , and SSI. Will restart home meds at discharge but patient will need close outpatient PCP follow up.   Renal: CKD stage III baseline Cr 2.3-2.6. Cr 2.3 today, stable at baseline. No UO recorded. K 4.2, at goal, continue supplementation. Under preop weight but still edematous on exam although it has improved. Will d/c on 3 days of Lasix  and then Lasix  PRN. Continue TED hose and elevate legs at home   ID: Likely reactive leukocytosis resolved, Tmax 99. Clinically monitor.    Expected postop ABLA: H/H 7.9/24.2, improving. Continue iron supplement. Reactive thrombocytopenia resolved.    DVT Prophylaxis:  On Plavix  and thrombocytopenia so Lovenox  has been held   Dispo: Plan to d/c home today   Addendum: As discussed with Dr. Shyrl will not discharge the patient with Lasix  since he is under his preop weight   LOS: 5 days    Con GORMAN Bend, PA-C 04/15/2024

## 2024-04-15 NOTE — TOC Transition Note (Signed)
 Transition of Care (TOC) - Discharge Note Rayfield Gobble RN, BSN Inpatient Care Management Unit 4E- RN Case Manager See Treatment Team for direct phone #   Patient Details  Name: Devon Wood MRN: [id number] Date of Birth: 1961-05-16  Transition of [facility]) CM/SW Contact:  Gobble Rayfield Hurst, RN Phone Number: 04/15/2024, 12:26 PM   Clinical Narrative:    Pt stable for transition home today, No HH or DME needs noted. Family to transport home.  [facility] was notified by [facility] office for [facility] referral- liaison updated no HH needs.   CM to sign off. No interventions needed for discharge.    Final next level of care: Home/Self Care Barriers to Discharge: Barriers Resolved   Patient Goals and CMS Choice     Choice offered to / list presented to : NA      Discharge Placement               Home        Discharge Plan and Services Additional resources added to the After Visit Summary for   In-house Referral: NA Discharge Planning Services: CM Consult Post Acute Care Choice: Home Health          DME Arranged: N/A DME Agency: NA                  Social Drivers of Health (SDOH) Interventions SDOH Screenings   Food Insecurity: No Food Insecurity (04/11/2024)  Housing: Low Risk  (04/11/2024)  Transportation Needs: No Transportation Needs (04/11/2024)  Utilities: Not At Risk (04/11/2024)  Financial Resource Strain: Low Risk  (09/13/2023)  Social Connections: Socially Integrated (09/06/2023)  Tobacco Use: Medium Risk (04/10/2024)  Health Literacy: Adequate Health Literacy (09/13/2023)     Readmission Risk Interventions     No data to display

## 2024-04-15 NOTE — Plan of Care (Signed)
 Problem: Education: Goal: Knowledge of General Education information will improve Description: Including pain rating scale, medication(s)/side effects and non-pharmacologic comfort measures Outcome: Progressing   Problem: Health Behavior/Discharge Planning: Goal: Ability to manage health-related needs will improve Outcome: Progressing   Problem: Clinical Measurements: Goal: Ability to maintain clinical measurements within normal limits will improve Outcome: Progressing Goal: Will remain free from infection Outcome: Progressing Goal: Diagnostic test results will improve Outcome: Progressing Goal: Respiratory complications will improve Outcome: Progressing Goal: Cardiovascular complication will be avoided Outcome: Progressing   Problem: Activity: Goal: Risk for activity intolerance will decrease Outcome: Progressing   Problem: Nutrition: Goal: Adequate nutrition will be maintained Outcome: Progressing   Problem: Coping: Goal: Level of anxiety will decrease Outcome: Progressing   Problem: Elimination: Goal: Will not experience complications related to bowel motility Outcome: Progressing Goal: Will not experience complications related to urinary retention Outcome: Progressing   Problem: Pain Managment: Goal: General experience of comfort will improve and/or be controlled Outcome: Progressing   Problem: Safety: Goal: Ability to remain free from injury will improve Outcome: Progressing   Problem: Skin Integrity: Goal: Risk for impaired skin integrity will decrease Outcome: Progressing   Problem: Education: Goal: Will demonstrate proper wound care and an understanding of methods to prevent future damage Outcome: Progressing Goal: Knowledge of disease or condition will improve Outcome: Progressing Goal: Knowledge of the prescribed therapeutic regimen will improve Outcome: Progressing Goal: Individualized Educational Video(s) Outcome: Progressing   Problem:  Activity: Goal: Risk for activity intolerance will decrease Outcome: Progressing   Problem: Cardiac: Goal: Will achieve and/or maintain hemodynamic stability Outcome: Progressing   Problem: Clinical Measurements: Goal: Postoperative complications will be avoided or minimized Outcome: Progressing   Problem: Respiratory: Goal: Respiratory status will improve Outcome: Progressing   Problem: Skin Integrity: Goal: Wound healing without signs and symptoms of infection Outcome: Progressing Goal: Risk for impaired skin integrity will decrease Outcome: Progressing   Problem: Urinary Elimination: Goal: Ability to achieve and maintain adequate renal perfusion and functioning will improve Outcome: Progressing   Problem: Education: Goal: Ability to describe self-care measures that may prevent or decrease complications (Diabetes Survival Skills Education) will improve Outcome: Progressing Goal: Individualized Educational Video(s) Outcome: Progressing   Problem: Coping: Goal: Ability to adjust to condition or change in health will improve Outcome: Progressing   Problem: Fluid Volume: Goal: Ability to maintain a balanced intake and output will improve Outcome: Progressing   Problem: Health Behavior/Discharge Planning: Goal: Ability to identify and utilize available resources and services will improve Outcome: Progressing Goal: Ability to manage health-related needs will improve Outcome: Progressing   Problem: Metabolic: Goal: Ability to maintain appropriate glucose levels will improve Outcome: Progressing   Problem: Nutritional: Goal: Maintenance of adequate nutrition will improve Outcome: Progressing Goal: Progress toward achieving an optimal weight will improve Outcome: Progressing   Problem: Skin Integrity: Goal: Risk for impaired skin integrity will decrease Outcome: Progressing   Problem: Tissue Perfusion: Goal: Adequacy of tissue perfusion will improve Outcome:  Progressing   Problem: Education: Goal: Ability to describe self-care measures that may prevent or decrease complications (Diabetes Survival Skills Education) will improve Outcome: Progressing Goal: Individualized Educational Video(s) Outcome: Progressing   Problem: Coping: Goal: Ability to adjust to condition or change in health will improve Outcome: Progressing   Problem: Fluid Volume: Goal: Ability to maintain a balanced intake and output will improve Outcome: Progressing   Problem: Health Behavior/Discharge Planning: Goal: Ability to identify and utilize available resources and services will improve Outcome: Progressing Goal: Ability to manage  health-related needs will improve Outcome: Progressing   Problem: Metabolic: Goal: Ability to maintain appropriate glucose levels will improve Outcome: Progressing   Problem: Nutritional: Goal: Maintenance of adequate nutrition will improve Outcome: Progressing Goal: Progress toward achieving an optimal weight will improve Outcome: Progressing

## 2024-04-15 NOTE — Care Management Important Message (Signed)
 Important Message  Patient Details  Name: Devon Wood MRN: [id number] Date of Birth: [birth year]   Important Message Given:  Yes - Medicare IM     [name] 04/15/2024, 10:53 AM

## 2024-04-16 ENCOUNTER — Other Ambulatory Visit (HOSPITAL_COMMUNITY): Payer: Self-pay

## 2024-04-16 LAB — TYPE AND SCREEN
ABO/RH(D): A POS
Antibody Screen: NEGATIVE
Unit division: 0
Unit division: 0
Unit division: 0
Unit division: 0

## 2024-04-16 LAB — BPAM RBC
Blood Product Expiration Date: 202508272359
Blood Product Expiration Date: 202508272359
Blood Product Expiration Date: 202508272359
Blood Product Expiration Date: 202508272359
ISSUE DATE / TIME: 202508071016
ISSUE DATE / TIME: 202508080030
ISSUE DATE / TIME: 202508080756
Unit Type and Rh: 6200
Unit Type and Rh: 6200
Unit Type and Rh: 6200
Unit Type and Rh: 6200

## 2024-04-19 ENCOUNTER — Ambulatory Visit
Attending: Thoracic Surgery (Cardiothoracic Vascular Surgery) | Admitting: Thoracic Surgery (Cardiothoracic Vascular Surgery)

## 2024-04-19 DIAGNOSIS — Z951 Presence of aortocoronary bypass graft: Secondary | ICD-10-CM

## 2024-04-19 NOTE — Progress Notes (Signed)
     [address]       [place] [address]             [phone]       Patient: Home Provider: Office Consent for Telemedicine visit obtained.  Today's visit was completed via a real-time telehealth (see specific modality noted below). The patient/authorized person provided oral consent at the time of the visit to engage in a telemedicine encounter with the present provider at [facility]. The patient/authorized person was informed of the potential benefits, limitations, and risks of telemedicine. The patient/authorized person expressed understanding that the laws that protect confidentiality also apply to telemedicine. The patient/authorized person acknowledged understanding that telemedicine does not provide emergency services and that he or she would need to call 911 or proceed to the nearest hospital for help if such a need arose.   Total time spent in the clinical discussion 10 minutes.  Telehealth Modality: Phone visit (audio only)  I had a telephone visit with  Devon Wood who is s/p CABG.  Overall doing well.  Pain is minimal.  Ambulating well. Vitals have been stable.  Jaryd Drew Bellamy will see us  back in 1 month with a chest x-ray for cardiac rehab clearance.  Lindora Alviar MALVA Rayas

## 2024-04-22 ENCOUNTER — Telehealth: Payer: Self-pay | Admitting: Cardiovascular Disease

## 2024-04-22 ENCOUNTER — Encounter: Payer: Self-pay | Admitting: Cardiovascular Disease

## 2024-04-22 NOTE — Telephone Encounter (Signed)
 Called and spoke with patient. 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 States his pain medication isn't working and his muscle relaxer only gives him a few hours of relief. Patient would like to know if it is safe for him to restart Mobic  with his current medications. Patient advised message will be forwarded to pharmacy team and Dr. Francyne to advise. Patient verbalized understanding and agreeable to plan.

## 2024-04-22 NOTE — Telephone Encounter (Signed)
 Pt c/o medication issue:  1. Name of Medication: meloxicam  (MOBIC ) 15 MG tablet  2. How are you currently taking this medication (dosage and times per day)? No   3. Are you having a reaction (difficulty breathing--STAT)? No  4. What is your medication issue? Pt would like to start taking this medication again due to it helping with his back issues.

## 2024-04-22 NOTE — Telephone Encounter (Signed)
 Called and spoke to patient. Advised patient of Dr. Tyrone recommendations. Patient verbalized understanding and agreeable to plan.  Patient states he will increase his dose of famotidine  to 40 mg daily instead of switching to pantoprazole .

## 2024-04-22 NOTE — Telephone Encounter (Signed)
 He can take Mobic , but preferably not every day. It is not an absolute contraindication, but increases the risk of gastrointestinal bleeding, which can be worse when taking aspirin  and clopidogrel . He should increase the dose of famotidine  to 40 mg daily or switch to a stronger stomach protecting agent, such as pantoprazole  (we can send in the pantoprazole  40 mg daily prescription if he wishes). He should not take other PPI meds (omeprazole, esomeprazole, lansoprazole, etc., because those will prevent normal activation of the clopidogrel ).

## 2024-04-26 ENCOUNTER — Telehealth: Admitting: Thoracic Surgery (Cardiothoracic Vascular Surgery)

## 2024-05-02 ENCOUNTER — Encounter: Payer: Self-pay | Admitting: Emergency Medicine

## 2024-05-02 ENCOUNTER — Ambulatory Visit: Attending: Cardiology | Admitting: Emergency Medicine

## 2024-05-02 VITALS — BP 114/72 | HR 70 | Ht 66.0 in | Wt 238.0 lb

## 2024-05-02 DIAGNOSIS — E119 Type 2 diabetes mellitus without complications: Secondary | ICD-10-CM | POA: Diagnosis present

## 2024-05-02 DIAGNOSIS — I2510 Atherosclerotic heart disease of native coronary artery without angina pectoris: Secondary | ICD-10-CM | POA: Insufficient documentation

## 2024-05-02 DIAGNOSIS — N1832 Chronic kidney disease, stage 3b: Secondary | ICD-10-CM | POA: Diagnosis present

## 2024-05-02 DIAGNOSIS — I77810 Thoracic aortic ectasia: Secondary | ICD-10-CM | POA: Diagnosis present

## 2024-05-02 DIAGNOSIS — I10 Essential (primary) hypertension: Secondary | ICD-10-CM | POA: Insufficient documentation

## 2024-05-02 DIAGNOSIS — E785 Hyperlipidemia, unspecified: Secondary | ICD-10-CM | POA: Insufficient documentation

## 2024-05-02 DIAGNOSIS — Z951 Presence of aortocoronary bypass graft: Secondary | ICD-10-CM | POA: Insufficient documentation

## 2024-05-02 MED ORDER — EMPAGLIFLOZIN 25 MG PO TABS: 25.0000 mg | ORAL_TABLET | Freq: Every day | ORAL | 2 refills | Status: AC

## 2024-05-02 NOTE — Patient Instructions (Signed)
 Medication Instructions:  NO CHANGES  Lab Work: BMET TO BE DONE TODAY.   Testing/Procedures: NONE  Follow-Up: At [facility], you and your health needs are our priority.  As part of our continuing mission to provide you with exceptional heart care, our providers are all part of one team.  This team includes your primary Cardiologist (physician) and Advanced Practice Providers or APPs (Physician Assistants and Nurse Practitioners) who all work together to provide you with the care you need, when you need it.  Your next appointment:   8-12 WEEKS   Provider:   Jerel Balding, MD ONLY

## 2024-05-02 NOTE — Progress Notes (Signed)
 Cardiology Office Note:    Date:  05/02/2024  ID:  DAVI Wood, DOB October 15, 1960, MRN [id number] PCP: Ransom Other, MD  [facility] Providers Cardiologist:  Jerel Balding, MD Cardiology APP:  Rana Lum CROME, NP       Patient Profile:       Chief Complaint: Follow-up post CABG History of Present Illness:  Devon Wood is a 63 y.o. male with visit-pertinent history of hypertension, hyperlipidemia, NIDDM, former smoker, morbid obesity, thoracic aortic aneurysm, CAD s/p PCI to LAD in 2004 and s/p PCI to ramus intermedius in 2025.   Patient had a remote stent placement to LAD in 2004 by Dr. Herminio.  Repeat cardiac catheterization 2011 showed widely patent stent.  Previously remote echocardiogram showed dilated ascending aorta.   Patient was admitted to the hospital on 09/06/2023 for STEMI.  Patient noted he had woke up around 7 AM that day with ingestion like symptoms that persisted with associated with chest pressure and left arm numbness.  EKG showed high lateral ST elevation and inferior ST depression.  He was taken to his the Cath Lab for urgent coronary angiography and intervention. He underwent cardiac catheterization with proximal 99% thrombotic stenosis to culprit lesion in ramus intermedius treated with PCI/DES x 1.  He does have residual disease in ostial/proximal right PDA of 90%, small OM 80%, OM2 70%, as well as mid to distal LAD of 60 to 80%.  It was felt that these lesions were not ideal for PCI and recommendations were for medical management.  He was placed on DAPT with aspirin /Brilinta  for at least 1 year.  His lisinopril /hydrochlorothiazide  was stopped given his electrolyte abnormalities and CKD with creatinine of 2.4.  He was started on amlodipine  10 mg daily.  He is intolerant to higher doses of statins due to myalgias and was continued on Crestor  10 mg and Zetia  10 mg was added.   Echocardiogram on 09/07/2023 showed LVEF 50%, LV with mildly decreased systolic  function, mid/apical anterior hypokinesis and hypokinesis of the anterolateral wall, grade 1 DD, RV SF normal, moderate dilation of aortic root measuring 44 mm.   He was seen in office on 09/14/2023.  He was without use without acute cardiovascular concerns or complaints and denied any chest pains.  Echocardiogram was ordered for 3 months out and completed on 12/07/2023 showing LVEF 60 to 65%, no RWMA, RV function and size normal, normal PASP, trivial MR, dilation of aortic root measuring 43 mm dilation of ascending aorta measuring 39 mm.  He was seen in the office on 12/15/2023.  He was without any chest pains or exertional angina.  He was active doing yard work and using an exercise bike without acute symptoms.  He was having some leg swelling on amlodipine .  He was switched to lisinopril /hydrochlorothiazide  20-12.5 mg daily.  He was last seen in office on 03/29/2024.  He reported he had been experiencing chest pain for the past 2 weeks that did not resolve with Pepto-Bismol or GERD interventions.  He was using nitroglycerin  for relief.  During episodes of pain his blood pressure was elevated reaching as high as 202/102.  He underwent cardiac catheterization 7/30 with ostial 80% de novo stenosis, proximal/mid LAD stent with 80% in-stent restenosis, high diagonal with 80% Denovo stenosis followed by stent with mid stent 95% in-stent restenosis, small OM1 75%, OM2 70%, distal RCA 75%.  Recommendations for CVTS consult.   He ultimately underwent CABG x 3 utilizing LIMA to LAD, SVG to PDA, and  SVG to diagonal as well as endarterectomy of the LAD and endoscopic harvest of the right greater saphenous vein on 04/10/2024.   Discussed the use of AI scribe software for clinical note transcription with the patient, who gave verbal consent to proceed.  History of Present Illness Devon Wood is a 63 year old male with coronary artery disease and diabetes who presents for follow-up after coronary artery bypass  grafting (CABG).  Today patient is doing well overall.  He denies any acute cardiovascular complaints today.  Reports his chest pains have entirely resolved.  He adheres to his prescribed medications.  His LDL levels are well controlled, but his A1c is 9.1. He is making dietary improvements but continues to consume snacks like potato chips and fries.  Post-surgery, he experienced episodes of tachycardia while trying to lay down, which have since improved and resolved. He has no current chest pain, shortness of breath, or palpitations. There is no tenderness except around the incision site.  He experiences pain in his right leg, particularly on the shin where veins were harvested for the bypass, affecting his sleep. He takes tramadol  for pain, which is ineffective, and Flexeril  for back pain, which helps but causes drowsiness.  He denies syncope, presyncope, lightheadedness, dizziness, orthopnea, PND  Review of systems:  Please see the history of present illness. All other systems are reviewed and otherwise negative.      Studies Reviewed:    EKG Interpretation Date/Time:  Thursday May 02 2024 10:35:16 EDT Ventricular Rate:  70 PR Interval:  210 QRS Duration:  104 QT Interval:  436 QTC Calculation: 470 R Axis:   -67  Text Interpretation: Sinus rhythm with 1st degree A-V block Left axis deviation Cannot rule out Anterior infarct , age undetermined T wave abnormality, consider lateral ischemia When compared with ECG of 11-Apr-2024 06:48, PR interval has increased Non-specific change in ST segment in Anterolateral leads Confirmed by Rana Dixon [phone]) on 05/02/2024 9:07:32 PM   Echocardiogram 04/04/2024 1. Left ventricular ejection fraction, by estimation, is 65 to 70%. The  left ventricle has normal function. The left ventricle has no regional  wall motion abnormalities. There is mild concentric left ventricular  hypertrophy. Left ventricular diastolic  parameters are consistent  with Grade II diastolic dysfunction  (pseudonormalization).   2. Right ventricular systolic function is normal. The right ventricular  size is mildly enlarged. Tricuspid regurgitation signal is inadequate for  assessing PA pressure.   3. The mitral valve is normal in structure. Trivial mitral valve  regurgitation.   4. The aortic valve has an indeterminant number of cusps. Aortic valve  regurgitation is not visualized.   5. Aortic dilatation noted. 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   6. The inferior vena cava is normal in size with greater than 50%  respiratory variability, suggesting right atrial pressure of 3 mmHg.   Cardiac catheterization 04/03/2024 LM: No significant disease LAD: Ostial 80% de novo stenosis           Prox-mid LAD stent with 80% restenosis           High diag with prox 80% de novo stenosis, followed by stent with focal mid stent 95% restenosis            (Stented on 09/06/2023 for STEMI, previously called ramus intermedius)           Mid to distal LAD skip lesions of 60-80%, Distal apical  LAD small caliber with diffuse 60% disease Lcx: Mild diffuse disease         Small OM1 with prox 75% disease         Medium OM2 with diffuse 70% disease RCA: Mild diffuse disease           Distal focal 75% stenosis           Ostial RPDA with 90% stenosis distal 80% diffuse disease Diagnostic Dominance: Right  Echocardiogram 12/07/2023 1. Left ventricular ejection fraction, by estimation, is 60 to 65%. Left  ventricular ejection fraction by 3D volume is 64 %. The left ventricle has  normal function. The left ventricle has no regional wall motion  abnormalities. Left ventricular diastolic   parameters are indeterminate.   2. Right ventricular systolic function is normal. The right ventricular  size is normal. There is normal pulmonary artery systolic pressure. The  estimated right ventricular systolic pressure is 29.2 mmHg.    3. The mitral valve is normal in structure. Trivial mitral valve  regurgitation. No evidence of mitral stenosis.   4. The aortic valve is tricuspid. Aortic valve regurgitation is not  visualized. No aortic stenosis is present.   5. Aortic dilatation noted. There is dilatation of the aortic root,  measuring 43 mm. There is dilatation of the ascending aorta, measuring 39  mm.   6. The inferior vena cava is normal in size with greater than 50%  respiratory variability, suggesting right atrial pressure of 3 mmHg.    Cardiac catheterization 09/06/2023 Successful percutaneous coronary intervention prox ramus        PTCA and stent placement 2.5 X 16 mm Synergy drug-eluting stent         Post dilatation using 2.75X12 mm [place] balloon up to 16 atm        TIMI flow I-->III        Culprit stenosis 99%-->0% Diagnostic Dominance: Right  Intervention    Risk Assessment/Calculations:              Physical Exam:   VS:  BP 114/72 (BP Location: Right Arm, Patient Position: Sitting, Cuff Size: Large)   Pulse 70   Ht 5' 6 (1.676 m)   Wt 238 lb (108 kg)   BMI 38.41 kg/m    Wt Readings from Last 3 Encounters:  05/02/24 238 lb (108 kg)  04/15/24 232 lb 9.4 oz (105.5 kg)  04/08/24 235 lb 8 oz (106.8 kg)    GEN: Well nourished, well developed in no acute distress NECK: No JVD; No carotid bruits CARDIAC: RRR, no murmurs, rubs, gallops RESPIRATORY:  Clear to auscultation without rales, wheezing or rhonchi  ABDOMEN: Soft, non-tender, non-distended EXTREMITIES:  No edema; No acute deformity      Assessment and Plan:  Coronary artery disease LHC 09/2023 with DES to LAD and D1 LHC 03/2024 with ostial 80% de novo stenosis, proximal/mid LAD stent with 80% in-stent restenosis, high diagonal with 80% Denovo stenosis followed by stent with mid stent 95% in-stent restenosis, small OM1 75%, OM2 70%, distal RCA 75%.  Recommendations for CVTS consult.  CABG x 3 utilizing LIMA to LAD, SVG to PDA, and SVG to  diagonal as well as endarterectomy of the LAD and endoscopic harvest of the right greater saphenous vein on 04/10/2024. Echocardiogram 7/31 with LVEF 65 to 70%, no RWMA, grade 2 DD - Today he is without any anginal symptoms. Chest pains have entirely resolved.  Has slightly increased activity level without exertional symptoms.  No indication  for further ischemic evaluation at this time - Sternal incision healing appropriately without signs of infection.  Reports pain to right shin.  No calf pain or swelling.  Continue current pain regimen - Continue uninterrupted DAPT with aspirin  81 mg daily and clopidogrel  75 mg daily - Continue ezetimibe  10 mg daily, rosuvastatin  20 mg daily, metoprolol  tartrate 37.5 mg twice daily   Thoracic aortic dilation Echo 09/2023 noted to be 44mm.  - CTA chest aorta 12/2023 with similar dilation of the ascending thoracic aorta measuring 4.0 cm - Maintain adequate blood pressure control   Hyperlipidemia, LDL goal <55 LDL 23, TC 73, TG 123 on 12/2023 and well-controlled - Continue rosuvastatin  20 mg daily and ezetimibe  10 mg daily - Recommend DASH diet (high in vegetables, fruits, low-fat dairy products, whole grains, poultry, fish, and nuts and low in sweets, sugar-sweetened beverages, and red meats), salt restriction and increase physical activity.    Hypertension Blood pressure today well-controlled at 114/72 - Continue metoprolol  tartrate 37.5 mg twice daily   CKD stage 3b Creatinine 2.3 and GFR 32 on 04/2024 - Management per nephrology - Avoid nephrotoxic drugs   T2DM A1c 9.1 on 04/2024 - Managed on Jardiance , metformin , and Januvia - I have asked him to inquire about GLP-1 therapy with his PCP later this    Cardiac Rehabilitation Eligibility Assessment  The patient is ready to start cardiac rehabilitation pending clearance from the cardiac surgeon.     Dispo:  Return in about 3 months (around 08/02/2024).  Signed, Lum LITTIE Louis, NP

## 2024-05-03 LAB — BASIC METABOLIC PANEL WITH GFR
BUN/Creatinine Ratio: 13 (ref 10–24)
BUN: 26 mg/dL (ref 8–27)
CO2: 20 mmol/L (ref 20–29)
Calcium: 8.8 mg/dL (ref 8.6–10.2)
Chloride: 104 mmol/L (ref 96–106)
Creatinine, Ser: 2.05 mg/dL — ABNORMAL HIGH (ref 0.76–1.27)
Glucose: 125 mg/dL — ABNORMAL HIGH (ref 70–99)
Potassium: 5.2 mmol/L (ref 3.5–5.2)
Sodium: 137 mmol/L (ref 134–144)
eGFR: 36 mL/min/1.73 — ABNORMAL LOW (ref >59)

## 2024-05-04 ENCOUNTER — Ambulatory Visit: Payer: Self-pay | Admitting: Emergency Medicine

## 2024-05-10 ENCOUNTER — Other Ambulatory Visit (HOSPITAL_COMMUNITY): Payer: Self-pay

## 2024-05-16 ENCOUNTER — Other Ambulatory Visit: Payer: Self-pay | Admitting: Thoracic Surgery (Cardiothoracic Vascular Surgery)

## 2024-05-16 DIAGNOSIS — Z951 Presence of aortocoronary bypass graft: Secondary | ICD-10-CM

## 2024-05-16 NOTE — Progress Notes (Unsigned)
 [address] [place] [address]             [phone]       HPI:  [name] is a 63 year old man with medical history of CAD, hypertension, STEMI, type 2 DM, chronic neck and back pain, CKD stage 3, obesity and hyperlipidemia who returns for routine postoperative follow-up having undergone CABG X 3, LIMA to LAD, SVG to PDA and SVG to D1, LAD endarterectomy and endoscopic greater saphenous vein harvest on the right on 04/10/2024 with Dr. Shyrl.   The patient's early postoperative recovery while in the hospital was notable for starting Plavix  for endarterectomy.  He was started on lopressor . He had expected acute post operative blood loss anemia and was transfused with 1 unit of packed red blood cells and started on oral iron supplementation. He was routinely diuresed. He was stable for discharge home on 04/15/2024.  Since hospital discharge the patient reports that he has been doing well. He has some pain along his right lower leg along the area of the vein harvest.  It has been improving with time.  He tried to the ice the area and that did not help.  He has had some left sided chest wall/incisional pain that is intermittent and does not require medications.  He has been exercising by walking and lifting 2 pound weights.  He denies anginal chest pain, shortness of breath calf pain, and lower leg swelling.   Allergies as of 05/17/2024       Reactions   Erythromycin Rash        Medication List        Accurate as of May 17, 2024 10:38 AM. If you have any questions, ask your nurse or doctor.          acetaminophen  325 MG tablet Commonly known as: Tylenol  Take 2 tablets (650 mg total) by mouth every 6 (six) hours as needed for mild pain (pain score 1-3).   aspirin  81 MG tablet Take 81 mg by mouth daily.   clopidogrel  75 MG tablet Commonly known as: PLAVIX  Take 1 tablet (75 mg total) by mouth daily.   co-enzyme Q-10 30 MG  capsule Take 30 mg by mouth 3 (three) times daily.   empagliflozin  25 MG Tabs tablet Commonly known as: Jardiance  Take 1 tablet (25 mg total) by mouth daily before breakfast.   escitalopram  10 MG tablet Commonly known as: LEXAPRO  Take 1 tablet (10 mg total) by mouth daily.   ezetimibe  10 MG tablet Commonly known as: ZETIA  Take 1 tablet (10 mg total) by mouth daily.   famotidine  20 MG tablet Commonly known as: PEPCID  Take 20 mg by mouth daily.   Fe Fum-Vit C-Vit B12-FA Caps capsule Commonly known as: TRIGELS-F FORTE Take 1 capsule by mouth daily after breakfast.   fluticasone  50 MCG/ACT nasal spray Commonly known as: FLONASE  Place 2 sprays into both nostrils daily as needed for allergies.   meloxicam  15 MG tablet Commonly known as: MOBIC  Take 15 mg by mouth daily.   metFORMIN  1000 MG tablet Commonly known as: GLUCOPHAGE  Take 1,000 mg by mouth 2 (two) times daily with a meal. What changed: how much to take   metoprolol  tartrate 25 MG tablet Commonly known as: LOPRESSOR  Take 1.5 tablets (37.5 mg total) by mouth 2 (two) times daily.   rosuvastatin  20 MG tablet Commonly known as: CRESTOR  Take 1 tablet (20 mg total) by  mouth daily.   sitaGLIPtin 100 MG tablet Commonly known as: JANUVIA Take 50 mg by mouth daily.   traMADol  50 MG tablet Commonly known as: ULTRAM  Take 1 tablet (50 mg total) by mouth every 6 (six) hours as needed for severe pain (pain score 7-10).         ROS  Review of Systems  Constitutional: Negative.  Negative for fever and malaise/fatigue.  Respiratory: Negative.  Negative for cough and shortness of breath.   Cardiovascular: Negative.  Negative for chest pain and leg swelling.  Neurological:  Negative for dizziness and headaches.     BP 109/70 (BP Location: Left Arm, Patient Position: Sitting, Cuff Size: Normal)   Pulse 93   Resp 20   Ht 5' 6 (1.676 m)   Wt 237 lb 9.6 oz (107.8 kg)   SpO2 97% Comment: RA  BMI 38.35 kg/m     Physical Exam Constitutional:      Appearance: Normal appearance.  HENT:     Head: Normocephalic and atraumatic.  Cardiovascular:     Rate and Rhythm: Normal rate and regular rhythm.     Heart sounds: Normal heart sounds, S1 normal and S2 normal.  Pulmonary:     Effort: Pulmonary effort is normal.     Breath sounds: Normal breath sounds.  Skin:    General: Skin is warm and dry.      Neurological:     General: No focal deficit present.     Mental Status: He is alert and oriented to person, place, and time.      Imaging: CLINICAL DATA:  cabg.   EXAM: CHEST - 2 VIEW   COMPARISON:  04/14/2024.   FINDINGS: Bilateral lung fields are clear. Bilateral costophrenic angles are clear.   Normal cardio-mediastinal silhouette.   Sternotomy wires noted, status post CABG. No pneumothorax on either side.   No acute osseous abnormalities.   The soft tissues are within normal limits.   IMPRESSION: No active cardiopulmonary disease.     Electronically Signed   By: Ree Molt M.D.   On: 05/17/2024 09:24   Assessment/Plan:  S/P CABG x 3 We reviewed today's chest x ray. We discussed driving and he is able to start at this time.  First time driving should be a short distance in the day time and he can slowly increase the distance.  He would like to participate in cardiac rehab and he is cleared to do so at this time.  He is to continue sternal precautions until a full 6 weeks (05/22/2024) from surgery.  Discussed that for lower leg incisional pain he can use a heating pad or warm compress.  He should attempt to keep lower legs elevated while sitting.  He can continue to use tylenol  as needed for incisional pain.   Follow up with TCTS as needed  He has been seen by cardiology at this time. He is to continue with baby aspirin , plavix , ezetimibe , rosuvastatin , and metoprolol .  His diabetes is managed with Jardiance , metformin  and januvia.  He is to continue follow up with  cardiology and PCP as scheduled.   Thoracic aortic dilation -CTA from 12/2023 showed ascending thoracic aorta measuring 4.0 cm.  Discussed follow up and he will continue follow up with cardiology at this time.  He can be referred back to our clinic for management if needed. Continue blood pressure control and refrain from heavy lifting.   Manuelita HERO Jenkins, PA-C 10:38 AM 05/17/24

## 2024-05-17 ENCOUNTER — Ambulatory Visit

## 2024-05-17 ENCOUNTER — Ambulatory Visit (HOSPITAL_COMMUNITY)
Admission: RE | Admit: 2024-05-17 | Discharge: 2024-05-17 | Disposition: A | Discharge status: Home / Self Care | Source: Ambulatory Visit | Attending: Cardiology | Admitting: Cardiology

## 2024-05-17 VITALS — BP 109/70 | HR 93 | Resp 20 | Ht 66.0 in | Wt 237.6 lb

## 2024-05-17 DIAGNOSIS — Z951 Presence of aortocoronary bypass graft: Secondary | ICD-10-CM

## 2024-05-17 NOTE — Patient Instructions (Signed)

## 2024-05-20 ENCOUNTER — Telehealth (HOSPITAL_COMMUNITY): Payer: Self-pay

## 2024-05-20 NOTE — Telephone Encounter (Signed)
 Pt insurance is active and benefits verified through [employer]. Co-pay $0, DED $257/$257 met, out of pocket $0/$0 met, co-insurance 20%. No pre-authorization required. Passport, 05/20/2024 @ 10:19am, REF# [id number].  2ndary insurance is active and benefits verified through [facility]. Co-pay $0, DED $1,500/$1,500 met, out of pocket $5,900/$5,900 met, co-insurance 30%. No pre-authorization required. 05/20/2024 @ 10:13am, REF# [id number]. [employer] confirmed Medicare B is primary insurance.   TCR/ICR? ICR Visit(date of service)limitation? No Can multiple codes be used on the same date of service/visit?(IF ITS A LIMIT) N/A  Is this a lifetime maximum or an annual maximum? Annual Has the member used any of these services to date? No Is there a time limit (weeks/months) on start of program and/or program completion? No

## 2024-05-20 NOTE — Telephone Encounter (Signed)
 Attempted to call patient regarding cardiac rehab- no answer, left message. Sent MyChart message.

## 2024-05-23 ENCOUNTER — Telehealth (HOSPITAL_COMMUNITY): Payer: Self-pay

## 2024-05-23 NOTE — Telephone Encounter (Signed)
 Patient called back to get scheduled in the Cardiac Rehab Program. Patient will come in for orientation on 9/25 and will attend the 12:30 exercise class.  Sent MyChart message.

## 2024-05-28 NOTE — Telephone Encounter (Signed)
 Pt confirmed cardiac rehab orientation appointment.  Cardiac risk assessment completed.

## 2024-05-30 ENCOUNTER — Encounter (HOSPITAL_COMMUNITY)
Admission: RE | Admit: 2024-05-30 | Discharge: 2024-05-30 | Disposition: A | Discharge status: Home / Self Care | Source: Ambulatory Visit | Attending: Cardiovascular Disease | Admitting: Cardiovascular Disease

## 2024-05-30 VITALS — BP 108/79 | HR 81 | Ht 66.0 in | Wt 239.2 lb

## 2024-05-30 DIAGNOSIS — Z951 Presence of aortocoronary bypass graft: Secondary | ICD-10-CM | POA: Insufficient documentation

## 2024-05-30 NOTE — Progress Notes (Signed)
 Cardiac Rehab Medication Review   Does the patient  feel that his/her medications are working for him/her? Yes    Has the patient been experiencing any side effects to the medications prescribed? No  Does the patient measure his/her own blood pressure or blood glucose at home?   Yes  Does the patient have any problems obtaining medications due to transportation or finances?   No  Understanding of regimen: excellent Understanding of indications: excellent Potential of compliance: excellent    Comments: Devon Wood understands his medications and regime well. He checks his BP occasionally and CBG every morning.    Devon KATHEE Pereyra, MS, ACSM-CEP 05/30/2024 11:10 AM

## 2024-05-30 NOTE — Progress Notes (Signed)
 Cardiac Individual Treatment Plan  Patient Details  Name: Devon Wood MRN: [id number] Date of Birth: 1960/12/21 Referring Provider:   Flowsheet Row INTENSIVE CARDIAC REHAB ORIENT from 05/30/2024 in [facility]  Referring Provider Jerel Balding, MD    Initial Encounter Date:  Flowsheet Row INTENSIVE CARDIAC REHAB ORIENT from 05/30/2024 in [facility]  Date 05/30/24    Visit Diagnosis: 04/10/24 CABG x3  Patient's Home Medications on Admission:  Current Outpatient Medications:    acetaminophen  (TYLENOL ) 325 MG tablet, Take 2 tablets (650 mg total) by mouth every 6 (six) hours as needed for mild pain (pain score 1-3)., Disp: , Rfl:    aspirin  81 MG tablet, Take 81 mg by mouth daily., Disp: , Rfl:    clopidogrel  (PLAVIX ) 75 MG tablet, Take 1 tablet (75 mg total) by mouth daily., Disp: 30 tablet, Rfl: 1   co-enzyme Q-10 30 MG capsule, Take 30 mg by mouth 3 (three) times daily. (Patient taking differently: Take 30 mg by mouth daily.), Disp: , Rfl:    colchicine  0.6 MG tablet, Take 0.6 mg by mouth as needed (gout)., Disp: , Rfl:    empagliflozin  (JARDIANCE ) 25 MG TABS tablet, Take 1 tablet (25 mg total) by mouth daily before breakfast., Disp: 90 tablet, Rfl: 2   escitalopram  (LEXAPRO ) 10 MG tablet, Take 1 tablet (10 mg total) by mouth daily., Disp: 30 tablet, Rfl: 1   ezetimibe  (ZETIA ) 10 MG tablet, Take 1 tablet (10 mg total) by mouth daily., Disp: 90 tablet, Rfl: 3   famotidine  (PEPCID ) 20 MG tablet, Take 20 mg by mouth daily.  (Patient taking differently: Take 40 mg by mouth daily.), Disp: , Rfl:    Fe Fum-Vit C-Vit B12-FA (TRIGELS-F FORTE) CAPS capsule, Take 1 capsule by mouth daily after breakfast., Disp: , Rfl:    fluticasone  (FLONASE ) 50 MCG/ACT nasal spray, Place 2 sprays into both nostrils daily as needed for allergies., Disp: , Rfl:    meloxicam  (MOBIC ) 15 MG tablet,  Take 15 mg by mouth daily., Disp: , Rfl:    metFORMIN  (GLUCOPHAGE ) 1000 MG tablet, Take 1,000 mg by mouth 2 (two) times daily with a meal. (Patient taking differently: Take 500 mg by mouth 2 (two) times daily with a meal.), Disp: , Rfl:    metoprolol  tartrate (LOPRESSOR ) 25 MG tablet, Take 1.5 tablets (37.5 mg total) by mouth 2 (two) times daily., Disp: 270 tablet, Rfl: 1   nitroGLYCERIN  (NITROSTAT ) 0.4 MG SL tablet, Place 0.4 mg under the tongue every 5 (five) minutes as needed for chest pain., Disp: , Rfl:    rosuvastatin  (CRESTOR ) 20 MG tablet, Take 1 tablet (20 mg total) by mouth daily., Disp: 90 tablet, Rfl: 3   sitaGLIPtin (JANUVIA) 100 MG tablet, Take 50 mg by mouth daily., Disp: , Rfl:    traMADol  (ULTRAM ) 50 MG tablet, Take 1 tablet (50 mg total) by mouth every 6 (six) hours as needed for severe pain (pain score 7-10). (Patient not taking: Reported on 05/30/2024), Disp: 28 tablet, Rfl: 0 No current facility-administered medications for this encounter.  Facility-Administered Medications Ordered in Other Encounters:    [facility], Patient & Family Education, , Does not apply, Once, Shyrl Linnie KIDD, MD  Past Medical History: Past Medical History:  Diagnosis Date   Anxiety 08/02/2012   Arthritis 04/12/2012   Bronchitis 04/13/2012   hx of;last time couple of yrs ago  CAD (coronary artery disease) 10/10/2013   2004 2.0 x 25 mm drug-eluting cypher stent to LAD    Chronic back pain 09/24/2003   L3 buldging disc;DDD   CKD (chronic kidney disease), stage III (HCC) 09/07/2023   Complication of anesthesia 2004   20-30 second asystole episode during induction  04/2003,  was on b-blocker tx at the time, found to have 80% LAD lesion s/p stent (but unclear if CAD was the direct cause) -- pt has had surgeries since then and had no issues with anesthesia   Coronary artery disease 2004   stent placement, sees Dr JAYSON ponds  [facility]   Depression 2006   Diabetes  mellitus 2005   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   DM type 2 causing complication (HCC) 10/10/2013   First degree heart block 09/06/2023   GERD (gastroesophageal reflux disease) 04/12/2012   takes Pepcid  daily   Gout 02/21/2007   hx of   History of kidney stones 04/13/2012   HTN (hypertension) 2005   Hypercholesteremia 04/13/2012   Joint pain 04/08/2012   Mixed hyperlipidemia 10/10/2013   Morbid obesity (HCC) 10/10/2013   Myocardial infarction (HCC) 09/06/2023   Nocturia 04/18/2012   Peptic ulcer 04/23/2012    Tobacco Use: Social History   Tobacco Use  Smoking Status Former   Current packs/day: 0.00   Average packs/day: 0.5 packs/day for 4.0 years (2.0 ttl pk-yrs)   Types: Cigarettes   Start date: 60   Quit date: 1984   Years since quitting: 41.7  Smokeless Tobacco Former   Types: Chew   Quit date: 1990    Labs: Review Flowsheet  More data exists      Latest Ref Rng & Units 12/13/2023 04/03/2024 04/04/2024 04/08/2024 04/10/2024  Labs for ITP Cardiac and Pulmonary Rehab  Cholestrol 0 - 200 mg/dL 73  - 56  - -  LDL (calc) 0 - 99 mg/dL 23  - 4  - -  HDL-C >59 mg/dL 28  - 24  - -  Trlycerides <150 mg/dL 876  - 860  - -  Hemoglobin A1c 4.8 - 5.6 % - 9.2  - 9.1  -  PH, Arterial 7.35 - 7.45 - - - - 7.305  7.305  7.279  7.369  7.364  7.386  7.325   PCO2 arterial 32 - 48 mmHg - - - - 46.0  42.6  51.0  35.6  36.9  38.0  38.8   Bicarbonate 20.0 - 28.0 mmol/L - - - - 22.6  20.9  23.9  20.6  21.0  22.8  21.4  20.2   TCO2 22 - 32 mmol/L - 20  - - 24  22  25  22  21  22  24  27  26  23  21  21  24    Acid-base deficit 0.0 - 2.0 mmol/L - - - - 3.0  5.0  3.0  4.0  4.0  2.0  5.0  5.0   O2 Saturation % - - - - 98  99  98  99  95  100  68  100     Details       Multiple values from one day are sorted in reverse-chronological order         Capillary Blood Glucose: Lab Results  Component Value Date   GLUCAP 124 (H) 04/15/2024   GLUCAP 267 (H) 04/14/2024   GLUCAP 193 (H)  04/14/2024   GLUCAP 229 (H) 04/14/2024   GLUCAP 176 (H)  04/14/2024     Exercise Target Goals: Exercise Program Goal: Individual exercise prescription set using results from initial 6 min walk test and THRR while considering  patient's activity barriers and safety.   Exercise Prescription Goal: Initial exercise prescription builds to 30-45 minutes a day of aerobic activity, 2-3 days per week.  Home exercise guidelines will be given to patient during program as part of exercise prescription that the participant will acknowledge.  Activity Barriers & Risk Stratification:  Activity Barriers & Cardiac Risk Stratification - 05/30/24 1157       Activity Barriers & Cardiac Risk Stratification   Activity Barriers Arthritis;Joint Problems;Back Problems;Neck/Spine Problems;Balance Concerns;Incisional Pain;Assistive Device    Cardiac Risk Stratification High   <5 METs on         6 Minute Walk:  6 Minute Walk     Row Name 05/30/24 1522         6 Minute Walk   Phase Initial     Distance 1025 feet     Walk Time 6 minutes     # of Rest Breaks 0     MPH 1.94     METS 2.3     RPE 11     Perceived Dyspnea  0     VO2 Peak 8.04     Symptoms Yes (comment)     Comments 4/10 back pain- chronic     Resting HR 81 bpm     Resting BP 108/79     Resting Oxygen Saturation  99 %     Exercise Oxygen Saturation  during 6 min walk 100 %     Max Ex. HR 83 bpm     Max Ex. BP 162/80     2 Minute Post BP 124/79        Oxygen Initial Assessment:   Oxygen Re-Evaluation:   Oxygen Discharge (Final Oxygen Re-Evaluation):   Initial Exercise Prescription:  Initial Exercise Prescription - 05/30/24 1200       Date of Initial Exercise RX and Referring Provider   Date 05/30/24    Referring Provider Jerel Balding, MD    Expected Discharge Date 08/23/24      NuStep   Level 1    SPM 80    Minutes 30    METs 2      Prescription Details   Frequency (times per week) 3    Duration  Progress to 30 minutes of continuous aerobic without signs/symptoms of physical distress      Intensity   THRR 40-80% of Max Heartrate 64-127    Ratings of Perceived Exertion 11-13    Perceived Dyspnea 0-4      Progression   Progression Continue progressive overload as per policy without signs/symptoms or physical distress.      Resistance Training   Training Prescription Yes    Weight 3    Reps 10-15          Perform Capillary Blood Glucose checks as needed.  Exercise Prescription Changes:   Exercise Comments:   Exercise Goals and Review:   Exercise Goals     Row Name 05/30/24 1158             Exercise Goals   Increase Physical Activity Yes       Intervention Provide advice, education, support and counseling about physical activity/exercise needs.;Develop an individualized exercise prescription for aerobic and resistive training based on initial evaluation findings, risk stratification, comorbidities and participant's personal goals.  Expected Outcomes Short Term: Attend rehab on a regular basis to increase amount of physical activity.;Long Term: Exercising regularly at least 3-5 days a week.;Long Term: Add in home exercise to make exercise part of routine and to increase amount of physical activity.       Increase Strength and Stamina Yes       Intervention Develop an individualized exercise prescription for aerobic and resistive training based on initial evaluation findings, risk stratification, comorbidities and participant's personal goals.;Provide advice, education, support and counseling about physical activity/exercise needs.       Expected Outcomes Long Term: Improve cardiorespiratory fitness, muscular endurance and strength as measured by increased METs and functional capacity ( );Short Term: Perform resistance training exercises routinely during rehab and add in resistance training at home;Short Term: Increase workloads from initial exercise prescription  for resistance, speed, and METs.       Able to understand and use rate of perceived exertion (RPE) scale Yes       Intervention Provide education and explanation on how to use RPE scale       Expected Outcomes Short Term: Able to use RPE daily in rehab to express subjective intensity level;Long Term:  Able to use RPE to guide intensity level when exercising independently       Knowledge and understanding of Target Heart Rate Range (THRR) Yes       Intervention Provide education and explanation of THRR including how the numbers were predicted and where they are located for reference       Expected Outcomes Long Term: Able to use THRR to govern intensity when exercising independently;Short Term: Able to state/look up THRR;Short Term: Able to use daily as guideline for intensity in rehab       Understanding of Exercise Prescription Yes       Intervention Provide education, explanation, and written materials on patient's individual exercise prescription       Expected Outcomes Short Term: Able to explain program exercise prescription;Long Term: Able to explain home exercise prescription to exercise independently          Exercise Goals Re-Evaluation :   Discharge Exercise Prescription (Final Exercise Prescription Changes):   Nutrition:  Target Goals: Understanding of nutrition guidelines, daily intake of sodium 1500mg , cholesterol 200mg , calories 30% from fat and 7% or less from saturated fats, daily to have 5 or more servings of fruits and vegetables.  Biometrics:  Pre Biometrics - 05/30/24 1157       Pre Biometrics   Waist Circumference 51 inches    Hip Circumference 44 inches    Waist to Hip Ratio 1.16 %    Triceps Skinfold 30 mm    % Body Fat 39.9 %    Grip Strength 14 kg    Flexibility 0 in   not done- back issues   Single Leg Stand 1.5 seconds           Nutrition Therapy Plan and Nutrition Goals:   Nutrition Assessments:  MEDIFICTS Score Key: >=70 Need to make  dietary changes  40-70 Heart Healthy Diet <= 40 Therapeutic Level Cholesterol Diet    Picture Your Plate Scores: <59 Unhealthy dietary pattern with much room for improvement. 41-50 Dietary pattern unlikely to meet recommendations for good health and room for improvement. 51-60 More healthful dietary pattern, with some room for improvement.  >60 Healthy dietary pattern, although there may be some specific behaviors that could be improved.    Nutrition Goals Re-Evaluation:   Nutrition Goals Re-Evaluation:  Nutrition Goals Discharge (Final Nutrition Goals Re-Evaluation):   Psychosocial: Target Goals: Acknowledge presence or absence of significant depression and/or stress, maximize coping skills, provide positive support system. Participant is able to verbalize types and ability to use techniques and skills needed for reducing stress and depression.  Initial Review & Psychosocial Screening:  Initial Psych Review & Screening - 05/30/24 1524       Initial Review   Current issues with Current Stress Concerns    Source of Stress Concerns None Identified    Comments Oneil shared that he has low levels of stress relating to general life things. Nothing specific. Denies any feelings of depression/anxiety or need for additional resources at this time.      Family Dynamics   Good Support System? Yes   wife     Barriers   Psychosocial barriers to participate in program The patient should benefit from training in stress management and relaxation.      Screening Interventions   Interventions To provide support and resources with identified psychosocial needs;Provide feedback about the scores to participant;Encouraged to exercise    Expected Outcomes Short Term goal: Identification and review with participant of any Quality of Life or Depression concerns found by scoring the questionnaire.;Long Term Goal: Stressors or current issues are controlled or eliminated.;Long Term goal: The  participant improves quality of Life and PHQ9 Scores as seen by post scores and/or verbalization of changes          Quality of Life Scores:  Quality of Life - 05/30/24 1525       Quality of Life   Select Quality of Life      Quality of Life Scores   Health/Function Pre 22.9 %    Socioeconomic Pre 27.38 %    Psych/Spiritual Pre 22.64 %    Family Pre 25.2 %    GLOBAL Pre 24.2 %         Scores of 19 and below usually indicate a poorer quality of life in these areas.  A difference of  2-3 points is a clinically meaningful difference.  A difference of 2-3 points in the total score of the Quality of Life Index has been associated with significant improvement in overall quality of life, self-image, physical symptoms, and general health in studies assessing change in quality of life.  PHQ-9: Review Flowsheet       05/30/2024  Depression screen PHQ 2/9  Decreased Interest 2  Down, Depressed, Hopeless 0  PHQ - 2 Score 2  Altered sleeping 3  Tired, decreased energy 0  Change in appetite 0  Feeling bad or failure about yourself  0  Trouble concentrating 0  Moving slowly or fidgety/restless 0  Suicidal thoughts 0  PHQ-9 Score 5  Difficult doing work/chores Not difficult at all   Interpretation of Total Score  Total Score Depression Severity:  1-4 = Minimal depression, 5-9 = Mild depression, 10-14 = Moderate depression, 15-19 = Moderately severe depression, 20-27 = Severe depression   Psychosocial Evaluation and Intervention:   Psychosocial Re-Evaluation:   Psychosocial Discharge (Final Psychosocial Re-Evaluation):   Vocational Rehabilitation: Provide vocational rehab assistance to qualifying candidates.   Vocational Rehab Evaluation & Intervention:  Vocational Rehab - 05/30/24 1158       Initial Vocational Rehab Evaluation & Intervention   Assessment shows need for Vocational Rehabilitation No   retired/disability         Education: Education Goals:  Education classes will be provided on a weekly basis, covering required topics.  Participant will state understanding/return demonstration of topics presented.     Core Videos: Exercise    Move It!  Clinical staff conducted group or individual video education with verbal and written material and guidebook.  Patient learns the recommended Pritikin exercise program. Exercise with the goal of living a long, healthy life. Some of the health benefits of exercise include controlled diabetes, healthier blood pressure levels, improved cholesterol levels, improved heart and lung capacity, improved sleep, and better body composition. Everyone should speak with their doctor before starting or changing an exercise routine.  Biomechanical Limitations Clinical staff conducted group or individual video education with verbal and written material and guidebook.  Patient learns how biomechanical limitations can impact exercise and how we can mitigate and possibly overcome limitations to have an impactful and balanced exercise routine.  Body Composition Clinical staff conducted group or individual video education with verbal and written material and guidebook.  Patient learns that body composition (ratio of muscle mass to fat mass) is a key component to assessing overall fitness, rather than body weight alone. Increased fat mass, especially visceral belly fat, can put us  at increased risk for metabolic syndrome, type 2 diabetes, heart disease, and even death. It is recommended to combine diet and exercise (cardiovascular and resistance training) to improve your body composition. Seek guidance from your physician and exercise physiologist before implementing an exercise routine.  Exercise Action Plan Clinical staff conducted group or individual video education with verbal and written material and guidebook.  Patient learns the recommended strategies to achieve and enjoy long-term exercise adherence, including  variety, self-motivation, self-efficacy, and positive decision making. Benefits of exercise include fitness, good health, weight management, more energy, better sleep, less stress, and overall well-being.  Medical   Heart Disease Risk Reduction Clinical staff conducted group or individual video education with verbal and written material and guidebook.  Patient learns our heart is our most vital organ as it circulates oxygen, nutrients, white blood cells, and hormones throughout the entire body, and carries waste away. Data supports a plant-based eating plan like the Pritikin Program for its effectiveness in slowing progression of and reversing heart disease. The video provides a number of recommendations to address heart disease.   Metabolic Syndrome and Belly Fat  Clinical staff conducted group or individual video education with verbal and written material and guidebook.  Patient learns what metabolic syndrome is, how it leads to heart disease, and how one can reverse it and keep it from coming back. You have metabolic syndrome if you have 3 of the following 5 criteria: abdominal obesity, high blood pressure, high triglycerides, low HDL cholesterol, and high blood sugar.  Hypertension and Heart Disease Clinical staff conducted group or individual video education with verbal and written material and guidebook.  Patient learns that high blood pressure, or hypertension, is very common in the [place] . Hypertension is largely due to excessive salt intake, but other important risk factors include being overweight, physical inactivity, drinking too much alcohol, smoking, and not eating enough potassium from fruits and vegetables. High blood pressure is a leading risk factor for heart attack, stroke, congestive heart failure, dementia, kidney failure, and premature death. Long-term effects of excessive salt intake include stiffening of the arteries and thickening of heart muscle and organ damage.  Recommendations include ways to reduce hypertension and the risk of heart disease.  Diseases of Our Time - Focusing on Diabetes Clinical staff conducted group or individual video education with verbal and written material and guidebook.  Patient learns why the best way to stop diseases of our time is prevention, through food and other lifestyle changes. Medicine (such as prescription pills and surgeries) is often only a Band-Aid on the problem, not a long-term solution. Most common diseases of our time include obesity, type 2 diabetes, hypertension, heart disease, and cancer. The Pritikin Program is recommended and has been proven to help reduce, reverse, and/or prevent the damaging effects of metabolic syndrome.  Nutrition   Overview of the Pritikin Eating Plan  Clinical staff conducted group or individual video education with verbal and written material and guidebook.  Patient learns about the Pritikin Eating Plan for disease risk reduction. The Pritikin Eating Plan emphasizes a wide variety of unrefined, minimally-processed carbohydrates, like fruits, vegetables, whole grains, and legumes. Go, Caution, and Stop food choices are explained. Plant-based and lean animal proteins are emphasized. Rationale provided for low sodium intake for blood pressure control, low added sugars for blood sugar stabilization, and low added fats and oils for coronary artery disease risk reduction and weight management.  Calorie Density  Clinical staff conducted group or individual video education with verbal and written material and guidebook.  Patient learns about calorie density and how it impacts the Pritikin Eating Plan. Knowing the characteristics of the food you choose will help you decide whether those foods will lead to weight gain or weight loss, and whether you want to consume more or less of them. Weight loss is usually a side effect of the Pritikin Eating Plan because of its focus on low calorie-dense  foods.  Label Reading  Clinical staff conducted group or individual video education with verbal and written material and guidebook.  Patient learns about the Pritikin recommended label reading guidelines and corresponding recommendations regarding calorie density, added sugars, sodium content, and whole grains.  Dining Out - Part 1  Clinical staff conducted group or individual video education with verbal and written material and guidebook.  Patient learns that restaurant meals can be sabotaging because they can be so high in calories, fat, sodium, and/or sugar. Patient learns recommended strategies on how to positively address this and avoid unhealthy pitfalls.  Facts on Fats  Clinical staff conducted group or individual video education with verbal and written material and guidebook.  Patient learns that lifestyle modifications can be just as effective, if not more so, as many medications for lowering your risk of heart disease. A Pritikin lifestyle can help to reduce your risk of inflammation and atherosclerosis (cholesterol build-up, or plaque, in the artery walls). Lifestyle interventions such as dietary choices and physical activity address the cause of atherosclerosis. A review of the types of fats and their impact on blood cholesterol levels, along with dietary recommendations to reduce fat intake is also included.  Nutrition Action Plan  Clinical staff conducted group or individual video education with verbal and written material and guidebook.  Patient learns how to incorporate Pritikin recommendations into their lifestyle. Recommendations include planning and keeping personal health goals in mind as an important part of their success.  Healthy Mind-Set    Healthy Minds, Bodies, Hearts  Clinical staff conducted group or individual video education with verbal and written material and guidebook.  Patient learns how to identify when they are stressed. Video will discuss the impact of that  stress, as well as the many benefits of stress management. Patient will also be introduced to stress management techniques. The way we think, act, and feel has an impact on our hearts.  How Our Thoughts  Can Heal Our Hearts  Clinical staff conducted group or individual video education with verbal and written material and guidebook.  Patient learns that negative thoughts can cause depression and anxiety. This can result in negative lifestyle behavior and serious health problems. Cognitive behavioral therapy is an effective method to help control our thoughts in order to change and improve our emotional outlook.  Additional Videos:  Exercise    Improving Performance  Clinical staff conducted group or individual video education with verbal and written material and guidebook.  Patient learns to use a non-linear approach by alternating intensity levels and lengths of time spent exercising to help burn more calories and lose more body fat. Cardiovascular exercise helps improve heart health, metabolism, hormonal balance, blood sugar control, and recovery from fatigue. Resistance training improves strength, endurance, balance, coordination, reaction time, metabolism, and muscle mass. Flexibility exercise improves circulation, posture, and balance. Seek guidance from your physician and exercise physiologist before implementing an exercise routine and learn your capabilities and proper form for all exercise.  Introduction to Yoga  Clinical staff conducted group or individual video education with verbal and written material and guidebook.  Patient learns about yoga, a discipline of the coming together of mind, breath, and body. The benefits of yoga include improved flexibility, improved range of motion, better posture and core strength, increased lung function, weight loss, and positive self-image. Yoga's heart health benefits include lowered blood pressure, healthier heart rate, decreased cholesterol and  triglyceride levels, improved immune function, and reduced stress. Seek guidance from your physician and exercise physiologist before implementing an exercise routine and learn your capabilities and proper form for all exercise.  Medical   Aging: Enhancing Your Quality of Life  Clinical staff conducted group or individual video education with verbal and written material and guidebook.  Patient learns key strategies and recommendations to stay in good physical health and enhance quality of life, such as prevention strategies, having an advocate, securing a Health Care Proxy and Power of Attorney, and keeping a list of medications and system for tracking them. It also discusses how to avoid risk for bone loss.  Biology of Weight Control  Clinical staff conducted group or individual video education with verbal and written material and guidebook.  Patient learns that weight gain occurs because we consume more calories than we burn (eating more, moving less). Even if your body weight is normal, you may have higher ratios of fat compared to muscle mass. Too much body fat puts you at increased risk for cardiovascular disease, heart attack, stroke, type 2 diabetes, and obesity-related cancers. In addition to exercise, following the Pritikin Eating Plan can help reduce your risk.  Decoding Lab Results  Clinical staff conducted group or individual video education with verbal and written material and guidebook.  Patient learns that lab test reflects one measurement whose values change over time and are influenced by many factors, including medication, stress, sleep, exercise, food, hydration, pre-existing medical conditions, and more. It is recommended to use the knowledge from this video to become more involved with your lab results and evaluate your numbers to speak with your doctor.   Diseases of Our Time - Overview  Clinical staff conducted group or individual video education with verbal and written  material and guidebook.  Patient learns that according to the CDC, 50% to 70% of chronic diseases (such as obesity, type 2 diabetes, elevated lipids, hypertension, and heart disease) are avoidable through lifestyle improvements including healthier food choices, listening to satiety cues, and  increased physical activity.  Sleep Disorders Clinical staff conducted group or individual video education with verbal and written material and guidebook.  Patient learns how good quality and duration of sleep are important to overall health and well-being. Patient also learns about sleep disorders and how they impact health along with recommendations to address them, including discussing with a physician.  Nutrition  Dining Out - Part 2 Clinical staff conducted group or individual video education with verbal and written material and guidebook.  Patient learns how to plan ahead and communicate in order to maximize their dining experience in a healthy and nutritious manner. Included are recommended food choices based on the type of restaurant the patient is visiting.   Fueling a Banker conducted group or individual video education with verbal and written material and guidebook.  There is a strong connection between our food choices and our health. Diseases like obesity and type 2 diabetes are very prevalent and are in large-part due to lifestyle choices. The Pritikin Eating Plan provides plenty of food and hunger-curbing satisfaction. It is easy to follow, affordable, and helps reduce health risks.  Menu Workshop  Clinical staff conducted group or individual video education with verbal and written material and guidebook.  Patient learns that restaurant meals can sabotage health goals because they are often packed with calories, fat, sodium, and sugar. Recommendations include strategies to plan ahead and to communicate with the manager, chef, or server to help order a healthier  meal.  Planning Your Eating Strategy  Clinical staff conducted group or individual video education with verbal and written material and guidebook.  Patient learns about the Pritikin Eating Plan and its benefit of reducing the risk of disease. The Pritikin Eating Plan does not focus on calories. Instead, it emphasizes high-quality, nutrient-rich foods. By knowing the characteristics of the foods, we choose, we can determine their calorie density and make informed decisions.  Targeting Your Nutrition Priorities  Clinical staff conducted group or individual video education with verbal and written material and guidebook.  Patient learns that lifestyle habits have a tremendous impact on disease risk and progression. This video provides eating and physical activity recommendations based on your personal health goals, such as reducing LDL cholesterol, losing weight, preventing or controlling type 2 diabetes, and reducing high blood pressure.  Vitamins and Minerals  Clinical staff conducted group or individual video education with verbal and written material and guidebook.  Patient learns different ways to obtain key vitamins and minerals, including through a recommended healthy diet. It is important to discuss all supplements you take with your doctor.   Healthy Mind-Set    Smoking Cessation  Clinical staff conducted group or individual video education with verbal and written material and guidebook.  Patient learns that cigarette smoking and tobacco addiction pose a serious health risk which affects millions of people. Stopping smoking will significantly reduce the risk of heart disease, lung disease, and many forms of cancer. Recommended strategies for quitting are covered, including working with your doctor to develop a successful plan.  Culinary   Becoming a Set designer conducted group or individual video education with verbal and written material and guidebook.  Patient learns  that cooking at home can be healthy, cost-effective, quick, and puts them in control. Keys to cooking healthy recipes will include looking at your recipe, assessing your equipment needs, planning ahead, making it simple, choosing cost-effective seasonal ingredients, and limiting the use of added fats, salts, and sugars.  Cooking - Breakfast and Snacks  Clinical staff conducted group or individual video education with verbal and written material and guidebook.  Patient learns how important breakfast is to satiety and nutrition through the entire day. Recommendations include key foods to eat during breakfast to help stabilize blood sugar levels and to prevent overeating at meals later in the day. Planning ahead is also a key component.  Cooking - Educational psychologist conducted group or individual video education with verbal and written material and guidebook.  Patient learns eating strategies to improve overall health, including an approach to cook more at home. Recommendations include thinking of animal protein as a side on your plate rather than center stage and focusing instead on lower calorie dense options like vegetables, fruits, whole grains, and plant-based proteins, such as beans. Making sauces in large quantities to freeze for later and leaving the skin on your vegetables are also recommended to maximize your experience.  Cooking - Healthy Salads and Dressing Clinical staff conducted group or individual video education with verbal and written material and guidebook.  Patient learns that vegetables, fruits, whole grains, and legumes are the foundations of the Pritikin Eating Plan. Recommendations include how to incorporate each of these in flavorful and healthy salads, and how to create homemade salad dressings. Proper handling of ingredients is also covered. Cooking - Soups and [employer] - Soups and Desserts Clinical staff conducted group or individual video education with  verbal and written material and guidebook.  Patient learns that Pritikin soups and desserts make for easy, nutritious, and delicious snacks and meal components that are low in sodium, fat, sugar, and calorie density, while high in vitamins, minerals, and filling fiber. Recommendations include simple and healthy ideas for soups and desserts.   Overview     The Pritikin Solution Program Overview Clinical staff conducted group or individual video education with verbal and written material and guidebook.  Patient learns that the results of the Pritikin Program have been documented in more than 100 articles published in peer-reviewed journals, and the benefits include reducing risk factors for (and, in some cases, even reversing) high cholesterol, high blood pressure, type 2 diabetes, obesity, and more! An overview of the three key pillars of the Pritikin Program will be covered: eating well, doing regular exercise, and having a healthy mind-set.  WORKSHOPS  Exercise: Exercise Basics: Building Your Action Plan Clinical staff led group instruction and group discussion with PowerPoint presentation and patient guidebook. To enhance the learning environment the use of posters, models and videos may be added. At the conclusion of this workshop, patients will comprehend the difference between physical activity and exercise, as well as the benefits of incorporating both, into their routine. Patients will understand the FITT (Frequency, Intensity, Time, and Type) principle and how to use it to build an exercise action plan. In addition, safety concerns and other considerations for exercise and cardiac rehab will be addressed by the presenter. The purpose of this lesson is to promote a comprehensive and effective weekly exercise routine in order to improve patients' overall level of fitness.   Managing Heart Disease: Your Path to a Healthier Heart Clinical staff led group instruction and group discussion with  PowerPoint presentation and patient guidebook. To enhance the learning environment the use of posters, models and videos may be added.At the conclusion of this workshop, patients will understand the anatomy and physiology of the heart. Additionally, they will understand how Pritikin's three pillars impact the risk factors,  the progression, and the management of heart disease.  The purpose of this lesson is to provide a high-level overview of the heart, heart disease, and how the Pritikin lifestyle positively impacts risk factors.  Exercise Biomechanics Clinical staff led group instruction and group discussion with PowerPoint presentation and patient guidebook. To enhance the learning environment the use of posters, models and videos may be added. Patients will learn how the structural parts of their bodies function and how these functions impact their daily activities, movement, and exercise. Patients will learn how to promote a neutral spine, learn how to manage pain, and identify ways to improve their physical movement in order to promote healthy living. The purpose of this lesson is to expose patients to common physical limitations that impact physical activity. Participants will learn practical ways to adapt and manage aches and pains, and to minimize their effect on regular exercise. Patients will learn how to maintain good posture while sitting, walking, and lifting.  Balance Training and Fall Prevention  Clinical staff led group instruction and group discussion with PowerPoint presentation and patient guidebook. To enhance the learning environment the use of posters, models and videos may be added. At the conclusion of this workshop, patients will understand the importance of their sensorimotor skills (vision, proprioception, and the vestibular system) in maintaining their ability to balance as they age. Patients will apply a variety of balancing exercises that are appropriate for their  current level of function. Patients will understand the common causes for poor balance, possible solutions to these problems, and ways to modify their physical environment in order to minimize their fall risk. The purpose of this lesson is to teach patients about the importance of maintaining balance as they age and ways to minimize their risk of falling.  WORKSHOPS   Nutrition:  Fueling a Ship broker led group instruction and group discussion with PowerPoint presentation and patient guidebook. To enhance the learning environment the use of posters, models and videos may be added. Patients will review the foundational principles of the Pritikin Eating Plan and understand what constitutes a serving size in each of the food groups. Patients will also learn Pritikin-friendly foods that are better choices when away from home and review make-ahead meal and snack options. Calorie density will be reviewed and applied to three nutrition priorities: weight maintenance, weight loss, and weight gain. The purpose of this lesson is to reinforce (in a group setting) the key concepts around what patients are recommended to eat and how to apply these guidelines when away from home by planning and selecting Pritikin-friendly options. Patients will understand how calorie density may be adjusted for different weight management goals.  Mindful Eating  Clinical staff led group instruction and group discussion with PowerPoint presentation and patient guidebook. To enhance the learning environment the use of posters, models and videos may be added. Patients will briefly review the concepts of the Pritikin Eating Plan and the importance of low-calorie dense foods. The concept of mindful eating will be introduced as well as the importance of paying attention to internal hunger signals. Triggers for non-hunger eating and techniques for dealing with triggers will be explored. The purpose of this lesson is to provide  patients with the opportunity to review the basic principles of the Pritikin Eating Plan, discuss the value of eating mindfully and how to measure internal cues of hunger and fullness using the Hunger Scale. Patients will also discuss reasons for non-hunger eating and learn strategies to use for controlling  emotional eating.  Targeting Your Nutrition Priorities Clinical staff led group instruction and group discussion with PowerPoint presentation and patient guidebook. To enhance the learning environment the use of posters, models and videos may be added. Patients will learn how to determine their genetic susceptibility to disease by reviewing their family history. Patients will gain insight into the importance of diet as part of an overall healthy lifestyle in mitigating the impact of genetics and other environmental insults. The purpose of this lesson is to provide patients with the opportunity to assess their personal nutrition priorities by looking at their family history, their own health history and current risk factors. Patients will also be able to discuss ways of prioritizing and modifying the Pritikin Eating Plan for their highest risk areas  Menu  Clinical staff led group instruction and group discussion with PowerPoint presentation and patient guidebook. To enhance the learning environment the use of posters, models and videos may be added. Using menus brought in from [employer], or printed from [employer], patients will apply the Pritikin dining out guidelines that were presented in the [employer] video. Patients will also be able to practice these guidelines in a variety of provided scenarios. The purpose of this lesson is to provide patients with the opportunity to practice hands-on learning of the Pritikin Dining Out guidelines with actual menus and practice scenarios.  Label Reading Clinical staff led group instruction and group discussion with PowerPoint  presentation and patient guidebook. To enhance the learning environment the use of posters, models and videos may be added. Patients will review and discuss the Pritikin label reading guidelines presented in Pritikin's Label Reading Educational series video. Using fool labels brought in from local grocery stores and markets, patients will apply the label reading guidelines and determine if the packaged food meet the Pritikin guidelines. The purpose of this lesson is to provide patients with the opportunity to review, discuss, and practice hands-on learning of the Pritikin Label Reading guidelines with actual packaged food labels. Cooking School  Pritikin's [employer] are designed to teach patients ways to prepare quick, simple, and affordable recipes at home. The importance of nutrition's role in chronic disease risk reduction is reflected in its emphasis in the overall Pritikin program. By learning how to prepare essential core Pritikin Eating Plan recipes, patients will increase control over what they eat; be able to customize the flavor of foods without the use of added salt, sugar, or fat; and improve the quality of the food they consume. By learning a set of core recipes which are easily assembled, quickly prepared, and affordable, patients are more likely to prepare more healthy foods at home. These workshops focus on convenient breakfasts, simple entres, side dishes, and desserts which can be prepared with minimal effort and are consistent with nutrition recommendations for cardiovascular risk reduction. Cooking [employer] are taught by a Armed forces logistics/support/administrative officer (RD) who has been trained by the [employer]. The chef or RD has a clear understanding of the importance of minimizing - if not completely eliminating - added fat, sugar, and sodium in recipes. Throughout the series of Cooking School Workshop sessions, patients will learn about healthy ingredients and  efficient methods of cooking to build confidence in their capability to prepare    Cooking School weekly topics:  Adding Flavor- Sodium-Free  Fast and Healthy Breakfasts  Powerhouse Plant-Based Proteins  Satisfying Salads and Dressings  Simple Sides and Sauces  International Cuisine-Spotlight on the Blue Zones  Delicious Desserts  Diplomatic Services operational officer - Meals in a Hydrologist and Snacks  Comforting Weekend Breakfasts  One-Pot Wonders   Fast [employer] Your Pritikin Plate  WORKSHOPS   Healthy Mindset (Psychosocial):  Focused Goals, Sustainable Changes Clinical staff led group instruction and group discussion with PowerPoint presentation and patient guidebook. To enhance the learning environment the use of posters, models and videos may be added. Patients will be able to apply effective goal setting strategies to establish at least one personal goal, and then take consistent, meaningful action toward that goal. They will learn to identify common barriers to achieving personal goals and develop strategies to overcome them. Patients will also gain an understanding of how our mind-set can impact our ability to achieve goals and the importance of cultivating a positive and growth-oriented mind-set. The purpose of this lesson is to provide patients with a deeper understanding of how to set and achieve personal goals, as well as the tools and strategies needed to overcome common obstacles which may arise along the way.  From Head to Heart: The Power of a Healthy Outlook  Clinical staff led group instruction and group discussion with PowerPoint presentation and patient guidebook. To enhance the learning environment the use of posters, models and videos may be added. Patients will be able to recognize and describe the impact of emotions and mood on physical health. They will discover the importance of self-care and explore self-care  practices which may work for them. Patients will also learn how to utilize the 4 C's to cultivate a healthier outlook and better manage stress and challenges. The purpose of this lesson is to demonstrate to patients how a healthy outlook is an essential part of maintaining good health, especially as they continue their cardiac rehab journey.  Healthy Sleep for a Healthy Heart Clinical staff led group instruction and group discussion with PowerPoint presentation and patient guidebook. To enhance the learning environment the use of posters, models and videos may be added. At the conclusion of this workshop, patients will be able to demonstrate knowledge of the importance of sleep to overall health, well-being, and quality of life. They will understand the symptoms of, and treatments for, common sleep disorders. Patients will also be able to identify daytime and nighttime behaviors which impact sleep, and they will be able to apply these tools to help manage sleep-related challenges. The purpose of this lesson is to provide patients with a general overview of sleep and outline the importance of quality sleep. Patients will learn about a few of the most common sleep disorders. Patients will also be introduced to the concept of "sleep hygiene," and discover ways to self-manage certain sleeping problems through simple daily behavior changes. Finally, the workshop will motivate patients by clarifying the links between quality sleep and their goals of heart-healthy living.   Recognizing and Reducing Stress Clinical staff led group instruction and group discussion with PowerPoint presentation and patient guidebook. To enhance the learning environment the use of posters, models and videos may be added. At the conclusion of this workshop, patients will be able to understand the types of stress reactions, differentiate between acute and chronic stress, and recognize the impact that chronic stress has on their health. They  will also be able to apply different coping mechanisms, such as reframing negative self-talk. Patients will have the opportunity to practice a variety of stress management techniques, such as deep abdominal breathing, progressive muscle relaxation, and/or  guided imagery.  The purpose of this lesson is to educate patients on the role of stress in their lives and to provide healthy techniques for coping with it.  Learning Barriers/Preferences:  Learning Barriers/Preferences - 05/30/24 1200       Learning Barriers/Preferences   Learning Preferences Audio;Computer/Internet;Written Material;Video;Verbal Instruction;Skilled Demonstration;Pictoral;Individual Instruction;Group Instruction          Education Topics:  Knowledge Questionnaire Score:  Knowledge Questionnaire Score - 05/30/24 1526       Knowledge Questionnaire Score   Pre Score 22/24          Core Components/Risk Factors/Patient Goals at Admission:  Personal Goals and Risk Factors at Admission - 05/30/24 1159       Core Components/Risk Factors/Patient Goals on Admission    Weight Management Yes;Obesity;Weight Loss    Intervention Weight Management: Develop a combined nutrition and exercise program designed to reach desired caloric intake, while maintaining appropriate intake of nutrient and fiber, sodium and fats, and appropriate energy expenditure required for the weight goal.;Weight Management: Provide education and appropriate resources to help participant work on and attain dietary goals.;Weight Management/Obesity: Establish reasonable short term and long term weight goals.;Obesity: Provide education and appropriate resources to help participant work on and attain dietary goals.    Admit Weight 239 lb 3.2 oz (108.5 kg)    Goal Weight: Long Term 200 lb (90.7 kg)   pt goal   Expected Outcomes Short Term: Continue to assess and modify interventions until short term weight is achieved;Long Term: Adherence to nutrition and  physical activity/exercise program aimed toward attainment of established weight goal;Weight Loss: Understanding of general recommendations for a balanced deficit meal plan, which promotes 1-2 lb weight loss per week and includes a negative energy balance of [phone] kcal/d;Understanding recommendations for meals to include 15-35% energy as protein, 25-35% energy from fat, 35-60% energy from carbohydrates, less than 200mg  of dietary cholesterol, 20-35 gm of total fiber daily;Understanding of distribution of calorie intake throughout the day with the consumption of 4-5 meals/snacks    Diabetes Yes    Intervention Provide education about signs/symptoms and action to take for hypo/hyperglycemia.;Provide education about proper nutrition, including hydration, and aerobic/resistive exercise prescription along with prescribed medications to achieve blood glucose in normal ranges: Fasting glucose 65-99 mg/dL    Expected Outcomes Short Term: Participant verbalizes understanding of the signs/symptoms and immediate care of hyper/hypoglycemia, proper foot care and importance of medication, aerobic/resistive exercise and nutrition plan for blood glucose control.;Long Term: Attainment of HbA1C < 7%.    Hypertension Yes    Intervention Provide education on lifestyle modifcations including regular physical activity/exercise, weight management, moderate sodium restriction and increased consumption of fresh fruit, vegetables, and low fat dairy, alcohol moderation, and smoking cessation.;Monitor prescription use compliance.    Expected Outcomes Short Term: Continued assessment and intervention until BP is < 140/2mm HG in hypertensive participants. < 130/62mm HG in hypertensive participants with diabetes, heart failure or chronic kidney disease.;Long Term: Maintenance of blood pressure at goal levels.    Lipids Yes    Intervention Provide education and support for participant on nutrition & aerobic/resistive exercise along  with prescribed medications to achieve LDL 70mg , HDL >40mg .    Expected Outcomes Short Term: Participant states understanding of desired cholesterol values and is compliant with medications prescribed. Participant is following exercise prescription and nutrition guidelines.;Long Term: Cholesterol controlled with medications as prescribed, with individualized exercise RX and with personalized nutrition plan. Value goals: LDL < 70mg , HDL > 40 mg.  Stress Yes    Intervention Offer individual and/or small group education and counseling on adjustment to heart disease, stress management and health-related lifestyle change. Teach and support self-help strategies.;Refer participants experiencing significant psychosocial distress to appropriate mental health specialists for further evaluation and treatment. When possible, include family members and significant others in education/counseling sessions.    Expected Outcomes Short Term: Participant demonstrates changes in health-related behavior, relaxation and other stress management skills, ability to obtain effective social support, and compliance with psychotropic medications if prescribed.;Long Term: Emotional wellbeing is indicated by absence of clinically significant psychosocial distress or social isolation.          Core Components/Risk Factors/Patient Goals Review:    Core Components/Risk Factors/Patient Goals at Discharge (Final Review):    ITP Comments:  ITP Comments     Row Name 05/30/24 1036           ITP Comments Dr. Wilbert Bihari medical director. Introduction to [facility] education/itensive cardiac rehab. Initial orientation packet reviewed with patient.          Comments: Participant attended orientation for the cardiac rehabilitation program on  05/30/2024  to perform initial intake and exercise walk test. Patient introduced to the [facility] education and orientation packet was reviewed. Completed 6-minute walk test,  measurements, initial ITP, and exercise prescription. Vital signs stable. Telemetry-normal sinus rhythm first degree AV block, asymptomatic.   Service time was from 1043 to 1227.  Con KATHEE Pereyra, MS, ACSM-CEP 05/30/2024 3:37 PM

## 2024-05-31 ENCOUNTER — Encounter: Payer: Self-pay | Admitting: *Deleted

## 2024-06-04 ENCOUNTER — Ambulatory Visit: Admitting: Emergency Medicine

## 2024-06-04 NOTE — Progress Notes (Deleted)
 Cardiology Office Note:    Date:  06/04/2024  ID:  Devon Wood, DOB Jul 10, 1961, MRN [id number] PCP: Ransom Other, MD  [facility] Providers Cardiologist:  Jerel Balding, MD Cardiology APP:  Rana Lum CROME, NP { Click to update primary MD,subspecialty MD or APP then REFRESH:1}    {Click to Open Review  :1}   Patient Profile:       Chief Complaint: *** History of Present Illness:  Devon Wood is a 63 y.o. male with visit-pertinent history of hypertension, hyperlipidemia, NIDDM, former smoker, morbid obesity, thoracic aortic aneurysm, CAD s/p PCI to LAD in 2004 and s/p PCI to ramus intermedius in 2025.   Patient had a remote stent placement to LAD in 2004 by Dr. Herminio.  Repeat cardiac catheterization 2011 showed widely patent stent.  Previously remote echocardiogram showed dilated ascending aorta.   Patient was admitted to the hospital on 09/06/2023 for STEMI.  Patient noted he had woke up around 7 AM that day with ingestion like symptoms that persisted with associated with chest pressure and left arm numbness.  EKG showed high lateral ST elevation and inferior ST depression.  He was taken to his the Cath Lab for urgent coronary angiography and intervention. He underwent cardiac catheterization with proximal 99% thrombotic stenosis to culprit lesion in ramus intermedius treated with PCI/DES x 1.  He does have residual disease in ostial/proximal right PDA of 90%, small OM 80%, OM2 70%, as well as mid to distal LAD of 60 to 80%.  It was felt that these lesions were not ideal for PCI and recommendations were for medical management.  He was placed on DAPT with aspirin /Brilinta  for at least 1 year.  His lisinopril /hydrochlorothiazide  was stopped given his electrolyte abnormalities and CKD with creatinine of 2.4.  He was started on amlodipine  10 mg daily.  He is intolerant to higher doses of statins due to myalgias and was continued on Crestor  10 mg and Zetia  10 mg was added.    Echocardiogram on 09/07/2023 showed LVEF 50%, LV with mildly decreased systolic function, mid/apical anterior hypokinesis and hypokinesis of the anterolateral wall, grade 1 DD, RV SF normal, moderate dilation of aortic root measuring 44 mm.   He was seen in office on 09/14/2023.  He was without use without acute cardiovascular concerns or complaints and denied any chest pains.  Echocardiogram was ordered for 3 months out and completed on 12/07/2023 showing LVEF 60 to 65%, no RWMA, RV function and size normal, normal PASP, trivial MR, dilation of aortic root measuring 43 mm dilation of ascending aorta measuring 39 mm.   He was seen in the office on 12/15/2023.  He was without any chest pains or exertional angina.  He was active doing yard work and using an exercise bike without acute symptoms.  He was having some leg swelling on amlodipine .  He was switched to lisinopril /hydrochlorothiazide  20-12.5 mg daily.   Seen in office on 03/29/2024.  He reported he had been experiencing chest pain for the past 2 weeks that did not resolve with Pepto-Bismol or GERD interventions.  He was using nitroglycerin  for relief.  During episodes of pain his blood pressure was elevated reaching as high as 202/102.  He underwent cardiac catheterization 7/30 with ostial 80% de novo stenosis, proximal/mid LAD stent with 80% in-stent restenosis, high diagonal with 80% Denovo stenosis followed by stent with mid stent 95% in-stent restenosis, small OM1 75%, OM2 70%, distal RCA 75%.  Recommendations for CVTS consult.  He ultimately underwent CABG x 3 utilizing LIMA to LAD, SVG to PDA, and SVG to diagonal as well as endarterectomy of the LAD and endoscopic harvest of the right greater saphenous vein on 04/10/2024.  He was last seen in clinic on 05/02/2024.  He was doing well and noted chest pains had been resolved.  He has slightly increased activity level without exertional symptoms.  No changes were made.  Discussed the use of AI scribe  software for clinical note transcription with the patient, who gave verbal consent to proceed.  History of Present Illness     Review of systems:  Please see the history of present illness. All other systems are reviewed and otherwise negative. ***      Studies Reviewed:        ***  Risk Assessment/Calculations:   {Does this patient have ATRIAL FIBRILLATION?:[phone]} No BP recorded.  {Refresh Note OR Click here to enter BP  :1}***        Physical Exam:   VS:  There were no vitals taken for this visit.   Wt Readings from Last 3 Encounters:  05/30/24 239 lb 3.2 oz (108.5 kg)  05/17/24 237 lb 9.6 oz (107.8 kg)  05/02/24 238 lb (108 kg)    GEN: Well nourished, well developed in no acute distress NECK: No JVD; No carotid bruits CARDIAC: ***RRR, no murmurs, rubs, gallops RESPIRATORY:  Clear to auscultation without rales, wheezing or rhonchi  ABDOMEN: Soft, non-tender, non-distended EXTREMITIES:  No edema; No acute deformity ***      Assessment and Plan:    Assessment and Plan Assessment & Plan      {Are you ordering a CV Procedure (e.g. stress test, cath, DCCV, TEE, etc)?   Press F2        :[id number]}  Dispo:  No follow-ups on file.  Signed, Lum LITTIE Louis, NP

## 2024-06-05 ENCOUNTER — Encounter (HOSPITAL_COMMUNITY)
Admission: RE | Admit: 2024-06-05 | Discharge: 2024-06-05 | Disposition: A | Discharge status: Home / Self Care | Source: Ambulatory Visit | Attending: Cardiovascular Disease | Admitting: Cardiovascular Disease

## 2024-06-05 DIAGNOSIS — Z951 Presence of aortocoronary bypass graft: Secondary | ICD-10-CM | POA: Insufficient documentation

## 2024-06-05 LAB — GLUCOSE, CAPILLARY
Glucose-Capillary: 157 mg/dL — ABNORMAL HIGH (ref 70–99)
Glucose-Capillary: 154 mg/dL — ABNORMAL HIGH (ref 70–99)

## 2024-06-05 NOTE — Progress Notes (Signed)
 Daily Session Note  Patient Details  Name: Devon Wood MRN: [id number] Date of Birth: 05-16-1961 Referring Provider:   Flowsheet Row INTENSIVE CARDIAC REHAB ORIENT from 05/30/2024 in [facility]  Referring Provider Jerel Balding, MD    Encounter Date: 06/05/2024  Check In:  Session Check In - 06/05/24 1242       Check-In   Supervising physician immediately available to respond to emergencies [facility] MD immediately available    Physician(s) Rosaline Bane NP    Location [facility]    Staff Present Alec Finder BS, ACSM-CEP, Exercise Physiologist;Jenette Rayson Lennon, RN, Avonne Gal, MS, ACSM-CEP, Exercise Physiologist;David Makemson, MS, ACSM-CEP, CCRP, Exercise Physiologist    Virtual Visit No    Medication changes reported     No    Fall or balance concerns reported    No    Tobacco Cessation No Change    Warm-up and Cool-down Performed as group-led instruction    Resistance Training Performed No    VAD Patient? No    PAD/SET Patient? No      Pain Assessment   Currently in Pain? No/denies    Pain Score 0-No pain    Multiple Pain Sites No          Capillary Blood Glucose: Results for orders placed or performed during the hospital encounter of 06/05/24 (from the past 24 hours)  Glucose, capillary     Status: Abnormal   Collection Time: 06/05/24 12:46 PM  Result Value Ref Range   Glucose-Capillary 157 (H) 70 - 99 mg/dL  Glucose, capillary     Status: Abnormal   Collection Time: 06/05/24  1:33 PM  Result Value Ref Range   Glucose-Capillary 154 (H) 70 - 99 mg/dL     Exercise Prescription Changes - 06/05/24 1400       Response to Exercise   Blood Pressure (Admit) 112/70    Blood Pressure (Exercise) 132/78    Blood Pressure (Exit) 136/68    Heart Rate (Exercise) 106 bpm    Heart Rate (Exit) 81 bpm    Rating of Perceived Exertion (Exercise) 13    Symptoms None    Comments Pt's first  day in the CRP2 program    Duration Continue with 30 min of aerobic exercise without signs/symptoms of physical distress.    Intensity THRR unchanged      Progression   Progression Continue to progress workloads to maintain intensity without signs/symptoms of physical distress.    Average METs 1.6      Resistance Training   Training Prescription No    Weight No wts on wednesdays      Interval Training   Interval Training No      NuStep   Level 1    SPM 66    Minutes 30    METs 1.6          Social History   Tobacco Use  Smoking Status Former   Current packs/day: 0.00   Average packs/day: 0.5 packs/day for 4.0 years (2.0 ttl pk-yrs)   Types: Cigarettes   Start date: 20   Quit date: 108   Years since quitting: 41.7  Smokeless Tobacco Former   Types: Chew   Quit date: 1990    Goals Met:  Exercise tolerated well No report of concerns or symptoms today  Goals Unmet:  Not Applicable  Comments: Pt started cardiac rehab today.  Pt tolerated light exercise without difficulty. VSS, telemetry-sinus  rhythm, asymptomatic.  Medication list reconciled. Pt denies barriers to medication compliance.  PSYCHOSOCIAL ASSESSMENT:  PHQ-5. Pt exhibits positive coping skills, hopeful outlook with supportive family. No psychosocial needs identified at this time, no psychosocial interventions necessary.    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   Pt oriented to exercise equipment and routine.    Understanding verbalized.     Dr. Wilbert Bihari is Medical Director for Cardiac Rehab at [facility].

## 2024-06-06 ENCOUNTER — Encounter

## 2024-06-06 VITALS — BP 125/82 | HR 86

## 2024-06-06 DIAGNOSIS — Z006 Encounter for examination for normal comparison and control in clinical research program: Secondary | ICD-10-CM

## 2024-06-06 MED ORDER — STUDY - ARTEMIS - ZILTIVEKIMAB 15 MG/0.5 ML OR PLACEBO SQ INJECTION (PI-CHRISTOPHER): 15.0000 mg | INJECTION | SUBCUTANEOUS | 0 refills | Status: DC

## 2024-06-06 NOTE — Research (Cosign Needed Addendum)
 ARTEMIS V7 (Month 9 +/-7 days)  Were all eligibility criteria met to continue in study? [x] Yes [] No   Hep B DNA monitoring: [] Yes [x] No    Concomitant meds: [x] Yes [] No   VS: [x] Yes [] No Subject resting for >5 mins before 3 sets of VS taken.    Any hospitalizations/Adverse Events/Infections identified: [] Yes [x] No Subject is s/p CABG x3 on 04/10/24. Denies s/sx of infections, incision healing well. Pt states chest pain preceding CABG surgery started around 11/Jul/2025.    Central Lab assessments drawn: [x] Yes [] No   biochemistry/hematology   Pregnancy Test Was the sample collected? [] Yes [] No [x] N/A Was the collection date the same as the visit date? [] Yes [] No Specimen Type: [] Serum [] Urine Collection Date: Collection Time: Pregnancy Test Result: [] Positive [] Negative [] Borderline [] Invalid   Study Drug dispensed via [facility]: [x] Yes [] No Subject returned 2 used pens from kit # [id number] and 1 used and 1 unused pen from kit #[id number] with boxes. Pens placed in sharps container and boxes with unused pen returned to pharmacy. Subject took home 4 pens for home doses from kit # [id number] and [id number]. Instructed to return used pens with boxes to next appt. Pt verbalized understanding.    Assess dosing and administration conditions: Yes [x]  No[]  Instructed patient to dose on same day every month to avoid doses less than 28 days apart. DFUs used for reference.    Ensure updated contact person list: [x] Yes [] No   Dosing diary updated: [x] Yes [] No   New Subject ID card given: [x] Yes [] No     Current Outpatient Medications:    acetaminophen  (TYLENOL ) 325 MG tablet, Take 2 tablets (650 mg total) by mouth every 6 (six) hours as needed for mild pain (pain score 1-3)., Disp: , Rfl:    aspirin  81 MG tablet, Take 81 mg by mouth daily., Disp: , Rfl:    clopidogrel  (PLAVIX ) 75 MG tablet, Take 1 tablet (75 mg total) by mouth daily., Disp: 30 tablet, Rfl: 1   co-enzyme Q-10 30 MG capsule, Take  30 mg by mouth 3 (three) times daily. (Patient taking differently: Take 30 mg by mouth daily. Taking once a day), Disp: , Rfl:    colchicine  0.6 MG tablet, Take 0.6 mg by mouth as needed (gout)., Disp: , Rfl:    empagliflozin  (JARDIANCE ) 25 MG TABS tablet, Take 1 tablet (25 mg total) by mouth daily before breakfast., Disp: 90 tablet, Rfl: 2   escitalopram  (LEXAPRO ) 10 MG tablet, Take 1 tablet (10 mg total) by mouth daily., Disp: 30 tablet, Rfl: 1   ezetimibe  (ZETIA ) 10 MG tablet, Take 1 tablet (10 mg total) by mouth daily., Disp: 90 tablet, Rfl: 3   famotidine  (PEPCID ) 20 MG tablet, Take 20 mg by mouth daily. , Disp: , Rfl:    Fe Fum-Vit C-Vit B12-FA (TRIGELS-F FORTE) CAPS capsule, Take 1 capsule by mouth daily after breakfast., Disp: , Rfl:    fluticasone  (FLONASE ) 50 MCG/ACT nasal spray, Place 2 sprays into both nostrils daily as needed for allergies., Disp: , Rfl:    meloxicam  (MOBIC ) 15 MG tablet, Take 15 mg by mouth daily., Disp: , Rfl:    metFORMIN  (GLUCOPHAGE ) 1000 MG tablet, Take 1,000 mg by mouth 2 (two) times daily with a meal., Disp: , Rfl:    metoprolol  tartrate (LOPRESSOR ) 25 MG tablet, Take 1.5 tablets (37.5 mg total) by mouth 2 (two) times daily., Disp: 270 tablet, Rfl: 1   nitroGLYCERIN  (NITROSTAT ) 0.4 MG SL tablet, Place 0.4 mg under the tongue every 5 (five) minutes  as needed for chest pain., Disp: , Rfl:    rosuvastatin  (CRESTOR ) 20 MG tablet, Take 1 tablet (20 mg total) by mouth daily., Disp: 90 tablet, Rfl: 3   sitaGLIPtin (JANUVIA) 100 MG tablet, Take 50 mg by mouth daily., Disp: , Rfl:    Study - ARTEMIS - ziltivekimab 15 mg/0.5 mL or placebo SQ injection (PI-Christopher), Inject 0.5 mLs (15 mg total) into the skin every 28 (twenty-eight) days., Disp: 2 mL, Rfl: 0   traMADol  (ULTRAM ) 50 MG tablet, Take 1 tablet (50 mg total) by mouth every 6 (six) hours as needed for severe pain (pain score 7-10). (Patient not taking: Reported on 06/06/2024), Disp: 28 tablet, Rfl: 0 No current  facility-administered medications for this visit.  Facility-Administered Medications Ordered in Other Visits:    [facility], Patient & Family Education, , Does not apply, Once, [place], Linnie KIDD, MD

## 2024-06-07 ENCOUNTER — Encounter (HOSPITAL_COMMUNITY)
Admission: RE | Admit: 2024-06-07 | Discharge: 2024-06-07 | Disposition: A | Discharge status: Home / Self Care | Source: Ambulatory Visit | Attending: Cardiovascular Disease | Admitting: Cardiovascular Disease

## 2024-06-07 DIAGNOSIS — Z951 Presence of aortocoronary bypass graft: Secondary | ICD-10-CM | POA: Diagnosis not present

## 2024-06-10 ENCOUNTER — Encounter (HOSPITAL_COMMUNITY)
Admission: RE | Admit: 2024-06-10 | Discharge: 2024-06-10 | Disposition: A | Discharge status: Home / Self Care | Source: Ambulatory Visit | Attending: Cardiovascular Disease | Admitting: Cardiovascular Disease

## 2024-06-10 DIAGNOSIS — Z951 Presence of aortocoronary bypass graft: Secondary | ICD-10-CM | POA: Diagnosis not present

## 2024-06-10 LAB — GLUCOSE, CAPILLARY
Glucose-Capillary: 157 mg/dL — ABNORMAL HIGH (ref 70–99)
Glucose-Capillary: 136 mg/dL — ABNORMAL HIGH (ref 70–99)

## 2024-06-10 NOTE — Research (Addendum)
 Are there any labs that are clinically significant?  Yes []  OR No[x]   Is the patient eligible to continue enrollment in the study after screening visit?  Yes [x]   OR No[] 

## 2024-06-11 ENCOUNTER — Encounter (HOSPITAL_COMMUNITY): Payer: Self-pay

## 2024-06-11 NOTE — Progress Notes (Signed)
 Cardiac Individual Treatment Plan  Patient Details  Name: Devon Wood MRN: [id number] Date of Birth: 10-Jan-1961 Referring Provider:   Flowsheet Row INTENSIVE CARDIAC REHAB ORIENT from 05/30/2024 in [facility]  Referring Provider Jerel Balding, MD    Initial Encounter Date:  Flowsheet Row INTENSIVE CARDIAC REHAB ORIENT from 05/30/2024 in [facility]  Date 05/30/24    Visit Diagnosis: No diagnosis found.  Patient's Home Medications on Admission:  Current Outpatient Medications:    acetaminophen  (TYLENOL ) 325 MG tablet, Take 2 tablets (650 mg total) by mouth every 6 (six) hours as needed for mild pain (pain score 1-3)., Disp: , Rfl:    aspirin  81 MG tablet, Take 81 mg by mouth daily., Disp: , Rfl:    clopidogrel  (PLAVIX ) 75 MG tablet, Take 1 tablet (75 mg total) by mouth daily., Disp: 30 tablet, Rfl: 1   co-enzyme Q-10 30 MG capsule, Take 30 mg by mouth 3 (three) times daily. (Patient taking differently: Take 30 mg by mouth daily. Taking once a day), Disp: , Rfl:    colchicine  0.6 MG tablet, Take 0.6 mg by mouth as needed (gout)., Disp: , Rfl:    empagliflozin  (JARDIANCE ) 25 MG TABS tablet, Take 1 tablet (25 mg total) by mouth daily before breakfast., Disp: 90 tablet, Rfl: 2   escitalopram  (LEXAPRO ) 10 MG tablet, Take 1 tablet (10 mg total) by mouth daily., Disp: 30 tablet, Rfl: 1   ezetimibe  (ZETIA ) 10 MG tablet, Take 1 tablet (10 mg total) by mouth daily., Disp: 90 tablet, Rfl: 3   famotidine  (PEPCID ) 20 MG tablet, Take 20 mg by mouth daily. , Disp: , Rfl:    Fe Fum-Vit C-Vit B12-FA (TRIGELS-F FORTE) CAPS capsule, Take 1 capsule by mouth daily after breakfast., Disp: , Rfl:    fluticasone  (FLONASE ) 50 MCG/ACT nasal spray, Place 2 sprays into both nostrils daily as needed for allergies., Disp: , Rfl:    meloxicam  (MOBIC ) 15 MG tablet, Take 15 mg by mouth daily., Disp: ,  Rfl:    metFORMIN  (GLUCOPHAGE ) 1000 MG tablet, Take 1,000 mg by mouth 2 (two) times daily with a meal., Disp: , Rfl:    metoprolol  tartrate (LOPRESSOR ) 25 MG tablet, Take 1.5 tablets (37.5 mg total) by mouth 2 (two) times daily., Disp: 270 tablet, Rfl: 1   nitroGLYCERIN  (NITROSTAT ) 0.4 MG SL tablet, Place 0.4 mg under the tongue every 5 (five) minutes as needed for chest pain., Disp: , Rfl:    rosuvastatin  (CRESTOR ) 20 MG tablet, Take 1 tablet (20 mg total) by mouth daily., Disp: 90 tablet, Rfl: 3   sitaGLIPtin (JANUVIA) 100 MG tablet, Take 50 mg by mouth daily., Disp: , Rfl:    Study - ARTEMIS - ziltivekimab 15 mg/0.5 mL or placebo SQ injection (PI-Christopher), Inject 0.5 mLs (15 mg total) into the skin every 28 (twenty-eight) days., Disp: 2 mL, Rfl: 0   traMADol  (ULTRAM ) 50 MG tablet, Take 1 tablet (50 mg total) by mouth every 6 (six) hours as needed for severe pain (pain score 7-10). (Patient not taking: Reported on 06/06/2024), Disp: 28 tablet, Rfl: 0 No current facility-administered medications for this visit.  Facility-Administered Medications Ordered in Other Visits:     Cardiac Surgery, Patient & Family Education, , Does not apply, Once, Shyrl Linnie KIDD, MD  Past Medical History: Past Medical History:  Diagnosis Date   Anxiety 08/02/2012   Arthritis 04/12/2012  Bronchitis 04/13/2012   hx of;last time couple of yrs ago   CAD (coronary artery disease) 10/10/2013   2004 2.0 x 25 mm drug-eluting cypher stent to LAD    Chronic back pain 09/24/2003   L3 buldging disc;DDD   CKD (chronic kidney disease), stage III (HCC) 09/07/2023   Complication of anesthesia 2004   20-30 second asystole episode during induction  04/2003,  was on b-blocker tx at the time, found to have 80% LAD lesion s/p stent (but unclear if CAD was the direct cause) -- pt has had surgeries since then and had no issues with anesthesia   Coronary artery disease 2004   stent placement, sees Dr JAYSON ponds  heart [facility]   Depression 2006   Diabetes mellitus 2005   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   DM type 2 causing complication (HCC) 10/10/2013   First degree heart block 09/06/2023   GERD (gastroesophageal reflux disease) 04/12/2012   takes Pepcid  daily   Gout 02/21/2007   hx of   History of kidney stones 04/13/2012   HTN (hypertension) 2005   Hypercholesteremia 04/13/2012   Joint pain 04/08/2012   Mixed hyperlipidemia 10/10/2013   Morbid obesity (HCC) 10/10/2013   Myocardial infarction (HCC) 09/06/2023   Nocturia 04/18/2012   Peptic ulcer 04/23/2012    Tobacco Use: Social History   Tobacco Use  Smoking Status Former   Current packs/day: 0.00   Average packs/day: 0.5 packs/day for 4.0 years (2.0 ttl pk-yrs)   Types: Cigarettes   Start date: 90   Quit date: 1984   Years since quitting: 41.7  Smokeless Tobacco Former   Types: Chew   Quit date: 1990    Labs: Review Flowsheet  More data exists      Latest Ref Rng & Units 12/13/2023 04/03/2024 04/04/2024 04/08/2024 04/10/2024  Labs for ITP Cardiac and Pulmonary Rehab  Cholestrol 0 - 200 mg/dL 73  - 56  - -  LDL (calc) 0 - 99 mg/dL 23  - 4  - -  HDL-C >59 mg/dL 28  - 24  - -  Trlycerides <150 mg/dL 876  - 860  - -  Hemoglobin A1c 4.8 - 5.6 % - 9.2  - 9.1  -  PH, Arterial 7.35 - 7.45 - - - - 7.305  7.305  7.279  7.369  7.364  7.386  7.325   PCO2 arterial 32 - 48 mmHg - - - - 46.0  42.6  51.0  35.6  36.9  38.0  38.8   Bicarbonate 20.0 - 28.0 mmol/L - - - - 22.6  20.9  23.9  20.6  21.0  22.8  21.4  20.2   TCO2 22 - 32 mmol/L - 20  - - 24  22  25  22  21  22  24  27  26  23  21  21  24    Acid-base deficit 0.0 - 2.0 mmol/L - - - - 3.0  5.0  3.0  4.0  4.0  2.0  5.0  5.0   O2 Saturation % - - - - 98  99  98  99  95  100  68  100     Details       Multiple values from one day are sorted in reverse-chronological order          Exercise Target Goals: Exercise Program Goal: Individual exercise prescription  set using results from initial 6 min walk test and THRR while considering  patient's  activity barriers and safety.   Exercise Prescription Goal: Initial exercise prescription builds to 30-45 minutes a day of aerobic activity, 2-3 days per week.  Home exercise guidelines will be given to patient during program as part of exercise prescription that the participant will acknowledge.   Education: Aerobic Exercise: - Group verbal and visual presentation on the components of exercise prescription. Introduces F.I.T.T principle from ACSM for exercise prescriptions.  Reviews F.I.T.T. principles of aerobic exercise including progression. Written material provided at class time.   Education: Resistance Exercise: - Group verbal and visual presentation on the components of exercise prescription. Introduces F.I.T.T principle from ACSM for exercise prescriptions  Reviews F.I.T.T. principles of resistance exercise including progression. Written material provided at class time.    Education: Exercise & Equipment Safety: - Individual verbal instruction and demonstration of equipment use and safety with use of the equipment.   Education: Exercise Physiology & General Exercise Guidelines: - Group verbal and written instruction with models to review the exercise physiology of the cardiovascular system and associated critical values. Provides general exercise guidelines with specific guidelines to those with heart or lung disease. Written material provided at class time.   Education: Flexibility, Balance, Mind/Body Relaxation: - Group verbal and visual presentation with interactive activity on the components of exercise prescription. Introduces F.I.T.T principle from ACSM for exercise prescriptions. Reviews F.I.T.T. principles of flexibility and balance exercise training including progression. Also discusses the mind body connection.  Reviews various relaxation techniques to help reduce and manage stress (i.e. Deep  breathing, progressive muscle relaxation, and visualization). Balance handout provided to take home. Written material provided at class time.   Activity Barriers & Risk Stratification:  Activity Barriers & Cardiac Risk Stratification - 05/30/24 1157       Activity Barriers & Cardiac Risk Stratification   Activity Barriers Arthritis;Joint Problems;Back Problems;Neck/Spine Problems;Balance Concerns;Incisional Pain;Assistive Device    Cardiac Risk Stratification High   <5 METs on         6 Minute Walk:  6 Minute Walk     Row Name 05/30/24 1522         6 Minute Walk   Phase Initial     Distance 1025 feet     Walk Time 6 minutes     # of Rest Breaks 0     MPH 1.94     METS 2.3     RPE 11     Perceived Dyspnea  0     VO2 Peak 8.04     Symptoms Yes (comment)     Comments 4/10 back pain- chronic     Resting HR 81 bpm     Resting BP 108/79     Resting Oxygen Saturation  99 %     Exercise Oxygen Saturation  during 6 min walk 100 %     Max Ex. HR 83 bpm     Max Ex. BP 162/80     2 Minute Post BP 124/79        Oxygen Initial Assessment:   Oxygen Re-Evaluation:   Oxygen Discharge (Final Oxygen Re-Evaluation):   Initial Exercise Prescription:  Initial Exercise Prescription - 05/30/24 1200       Date of Initial Exercise RX and Referring Provider   Date 05/30/24    Referring Provider Jerel Balding, MD    Expected Discharge Date 08/23/24      NuStep   Level 1    SPM 80    Minutes 30    METs 2  Prescription Details   Frequency (times per week) 3    Duration Progress to 30 minutes of continuous aerobic without signs/symptoms of physical distress      Intensity   THRR 40-80% of Max Heartrate 64-127    Ratings of Perceived Exertion 11-13    Perceived Dyspnea 0-4      Progression   Progression Continue progressive overload as per policy without signs/symptoms or physical distress.      Resistance Training   Training Prescription Yes    Weight 3     Reps 10-15          Perform Capillary Blood Glucose checks as needed.  Exercise Prescription Changes:   Exercise Prescription Changes     Row Name 06/05/24 1400             Response to Exercise   Blood Pressure (Admit) 112/70       Blood Pressure (Exercise) 132/78       Blood Pressure (Exit) 136/68       Heart Rate (Exercise) 106 bpm       Heart Rate (Exit) 81 bpm       Rating of Perceived Exertion (Exercise) 13       Symptoms None       Comments Pt's first day in the CRP2 program       Duration Continue with 30 min of aerobic exercise without signs/symptoms of physical distress.       Intensity THRR unchanged         Progression   Progression Continue to progress workloads to maintain intensity without signs/symptoms of physical distress.       Average METs 1.6         Resistance Training   Training Prescription No       Weight No wts on wednesdays         Interval Training   Interval Training No         NuStep   Level 1       SPM 66       Minutes 30       METs 1.6          Exercise Comments:   Exercise Comments     Row Name 06/05/24 1419           Exercise Comments Pt's first day in the CRP2 program. Pt exercised without complaints.          Exercise Goals and Review:   Exercise Goals     Row Name 05/30/24 1158             Exercise Goals   Increase Physical Activity Yes       Intervention Provide advice, education, support and counseling about physical activity/exercise needs.;Develop an individualized exercise prescription for aerobic and resistive training based on initial evaluation findings, risk stratification, comorbidities and participant's personal goals.       Expected Outcomes Short Term: Attend rehab on a regular basis to increase amount of physical activity.;Long Term: Exercising regularly at least 3-5 days a week.;Long Term: Add in home exercise to make exercise part of routine and to increase amount of physical activity.        Increase Strength and Stamina Yes       Intervention Develop an individualized exercise prescription for aerobic and resistive training based on initial evaluation findings, risk stratification, comorbidities and participant's personal goals.;Provide advice, education, support and counseling about physical activity/exercise needs.       Expected  Outcomes Long Term: Improve cardiorespiratory fitness, muscular endurance and strength as measured by increased METs and functional capacity ( );Short Term: Perform resistance training exercises routinely during rehab and add in resistance training at home;Short Term: Increase workloads from initial exercise prescription for resistance, speed, and METs.       Able to understand and use rate of perceived exertion (RPE) scale Yes       Intervention Provide education and explanation on how to use RPE scale       Expected Outcomes Short Term: Able to use RPE daily in rehab to express subjective intensity level;Long Term:  Able to use RPE to guide intensity level when exercising independently       Knowledge and understanding of Target Heart Rate Range (THRR) Yes       Intervention Provide education and explanation of THRR including how the numbers were predicted and where they are located for reference       Expected Outcomes Long Term: Able to use THRR to govern intensity when exercising independently;Short Term: Able to state/look up THRR;Short Term: Able to use daily as guideline for intensity in rehab       Understanding of Exercise Prescription Yes       Intervention Provide education, explanation, and written materials on patient's individual exercise prescription       Expected Outcomes Short Term: Able to explain program exercise prescription;Long Term: Able to explain home exercise prescription to exercise independently          Exercise Goals Re-Evaluation :  Exercise Goals Re-Evaluation     Row Name 06/05/24 1416             Exercise  Goal Re-Evaluation   Exercise Goals Review Increase Physical Activity;Understanding of Exercise Prescription;Increase Strength and Stamina;Knowledge and understanding of Target Heart Rate Range (THRR);Able to understand and use rate of perceived exertion (RPE) scale       Comments Pt's first day in the CRP2 program. Pt understands the exercise Rx, RPE scale and THRR.       Expected Outcomes Will continue to monitor patient and progress workloads as toelrated.          Discharge Exercise Prescription (Final Exercise Prescription Changes):  Exercise Prescription Changes - 06/05/24 1400       Response to Exercise   Blood Pressure (Admit) 112/70    Blood Pressure (Exercise) 132/78    Blood Pressure (Exit) 136/68    Heart Rate (Exercise) 106 bpm    Heart Rate (Exit) 81 bpm    Rating of Perceived Exertion (Exercise) 13    Symptoms None    Comments Pt's first day in the CRP2 program    Duration Continue with 30 min of aerobic exercise without signs/symptoms of physical distress.    Intensity THRR unchanged      Progression   Progression Continue to progress workloads to maintain intensity without signs/symptoms of physical distress.    Average METs 1.6      Resistance Training   Training Prescription No    Weight No wts on wednesdays      Interval Training   Interval Training No      NuStep   Level 1    SPM 66    Minutes 30    METs 1.6          Nutrition:  Target Goals: Understanding of nutrition guidelines, daily intake of sodium 1500mg , cholesterol 200mg , calories 30% from fat and 7% or less from saturated fats, daily to  have 5 or more servings of fruits and vegetables.  Education: Nutrition 1 -Group instruction provided by verbal, written material, interactive activities, discussions, models, and posters to present general guidelines for heart healthy nutrition including macronutrients, label reading, and promoting whole foods over processed counterparts. Education  serves as Pensions consultant of discussion of heart healthy eating for all. Written material provided at class time.    Education: Nutrition 2 -Group instruction provided by verbal, written material, interactive activities, discussions, models, and posters to present general guidelines for heart healthy nutrition including sodium, cholesterol, and saturated fat. Providing guidance of habit forming to improve blood pressure, cholesterol, and body weight. Written material provided at class time.     Biometrics:  Pre Biometrics - 05/30/24 1157       Pre Biometrics   Waist Circumference 51 inches    Hip Circumference 44 inches    Waist to Hip Ratio 1.16 %    Triceps Skinfold 30 mm    % Body Fat 39.9 %    Grip Strength 14 kg    Flexibility 0 in   not done- back issues   Single Leg Stand 1.5 seconds           Nutrition Therapy Plan and Nutrition Goals:   Nutrition Assessments:  MEDIFICTS Score Key: >=70 Need to make dietary changes  40-70 Heart Healthy Diet <= 40 Therapeutic Level Cholesterol Diet  Flowsheet Row INTENSIVE CARDIAC REHAB from 06/05/2024 in [facility]  Picture Your Plate Total Score on Admission 66   Picture Your Plate Scores: <59 Unhealthy dietary pattern with much room for improvement. 41-50 Dietary pattern unlikely to meet recommendations for good health and room for improvement. 51-60 More healthful dietary pattern, with some room for improvement.  >60 Healthy dietary pattern, although there may be some specific behaviors that could be improved.    Nutrition Goals Re-Evaluation:   Nutrition Goals Discharge (Final Nutrition Goals Re-Evaluation):   Psychosocial: Target Goals: Acknowledge presence or absence of significant depression and/or stress, maximize coping skills, provide positive support system. Participant is able to verbalize types and ability to use techniques and skills needed for reducing  stress and depression.   Education: Stress, Anxiety, and Depression - Group verbal and visual presentation to define topics covered.  Reviews how body is impacted by stress, anxiety, and depression.  Also discusses healthy ways to reduce stress and to treat/manage anxiety and depression. Written material provided at class time.   Education: Sleep Hygiene -Provides group verbal and written instruction about how sleep can affect your health.  Define sleep hygiene, discuss sleep cycles and impact of sleep habits. Review good sleep hygiene tips.   Initial Review & Psychosocial Screening:  Initial Psych Review & Screening - 05/30/24 1524       Initial Review   Current issues with Current Stress Concerns    Source of Stress Concerns None Identified    Comments Devon Wood shared that he has low levels of stress relating to general life things. Nothing specific. Denies any feelings of depression/anxiety or need for additional resources at this time.      Family Dynamics   Good Support System? Yes   wife     Barriers   Psychosocial barriers to participate in program The patient should benefit from training in stress management and relaxation.      Screening Interventions   Interventions To provide support and resources with identified psychosocial needs;Provide feedback about the scores to  participant;Encouraged to exercise    Expected Outcomes Short Term goal: Identification and review with participant of any Quality of Life or Depression concerns found by scoring the questionnaire.;Long Term Goal: Stressors or current issues are controlled or eliminated.;Long Term goal: The participant improves quality of Life and PHQ9 Scores as seen by post scores and/or verbalization of changes          Quality of Life Scores:   Quality of Life - 05/30/24 1525       Quality of Life   Select Quality of Life      Quality of Life Scores   Health/Function Pre 22.9 %    Socioeconomic Pre 27.38 %     Psych/Spiritual Pre 22.64 %    Family Pre 25.2 %    GLOBAL Pre 24.2 %         Scores of 19 and below usually indicate a poorer quality of life in these areas.  A difference of  2-3 points is a clinically meaningful difference.  A difference of 2-3 points in the total score of the Quality of Life Index has been associated with significant improvement in overall quality of life, self-image, physical symptoms, and general health in studies assessing change in quality of life.  PHQ-9: Review Flowsheet       05/30/2024  Depression screen PHQ 2/9  Decreased Interest 2  Down, Depressed, Hopeless 0  PHQ - 2 Score 2  Altered sleeping 3  Tired, decreased energy 0  Change in appetite 0  Feeling bad or failure about yourself  0  Trouble concentrating 0  Moving slowly or fidgety/restless 0  Suicidal thoughts 0  PHQ-9 Score 5  Difficult doing work/chores Not difficult at all   Interpretation of Total Score  Total Score Depression Severity:  1-4 = Minimal depression, 5-9 = Mild depression, 10-14 = Moderate depression, 15-19 = Moderately severe depression, 20-27 = Severe depression   Psychosocial Evaluation and Intervention:   Psychosocial Re-Evaluation:   Psychosocial Discharge (Final Psychosocial Re-Evaluation):   Vocational Rehabilitation: Provide vocational rehab assistance to qualifying candidates.   Vocational Rehab Evaluation & Intervention:  Vocational Rehab - 05/30/24 1158       Initial Vocational Rehab Evaluation & Intervention   Assessment shows need for Vocational Rehabilitation No   retired/disability         Education: Education Goals: Education classes will be provided on a variety of topics geared toward better understanding of heart health and risk factor modification. Participant will state understanding/return demonstration of topics presented as noted by education test scores.  Learning Barriers/Preferences:  Learning Barriers/Preferences - 05/30/24 1200        Learning Barriers/Preferences   Learning Preferences Audio;Computer/Internet;Written Material;Video;Verbal Instruction;Skilled Demonstration;Pictoral;Individual Instruction;Group Instruction          General Cardiac Education Topics:  AED/CPR: - Group verbal and written instruction with the use of models to demonstrate the basic use of the AED with the basic ABC's of resuscitation.   Test and Procedures: - Group verbal and visual presentation and models provide information about basic cardiac anatomy and function. Reviews the testing methods done to diagnose heart disease and the outcomes of the test results. Describes the treatment choices: Medical Management, Angioplasty, or Coronary Bypass Surgery for treating various heart conditions including Myocardial Infarction, Angina, Valve Disease, and Cardiac Arrhythmias. Written material provided at class time.   Medication Safety: - Group verbal and visual instruction to review commonly prescribed medications for heart and lung disease. Reviews the medication, class of  the drug, and side effects. Includes the steps to properly store meds and maintain the prescription regimen. Written material provided at class time.   Intimacy: - Group verbal instruction through game format to discuss how heart and lung disease can affect sexual intimacy. Written material provided at class time.   Know Your Numbers and Heart Failure: - Group verbal and visual instruction to discuss disease risk factors for cardiac and pulmonary disease and treatment options.  Reviews associated critical values for Overweight/Obesity, Hypertension, Cholesterol, and Diabetes.  Discusses basics of heart failure: signs/symptoms and treatments.  Introduces Heart Failure Zone chart for action plan for heart failure. Written material provided at class time.   Infection Prevention: - Provides verbal and written material to individual with discussion of infection control  including proper hand washing and proper equipment cleaning during exercise session.   Falls Prevention: - Provides verbal and written material to individual with discussion of falls prevention and safety.   Other: -Provides group and verbal instruction on various topics (see comments)   Knowledge Questionnaire Score:  Knowledge Questionnaire Score - 05/30/24 1526       Knowledge Questionnaire Score   Pre Score 22/24          Core Components/Risk Factors/Patient Goals at Admission:  Personal Goals and Risk Factors at Admission - 05/30/24 1159       Core Components/Risk Factors/Patient Goals on Admission    Weight Management Yes;Obesity;Weight Loss    Intervention Weight Management: Develop a combined nutrition and exercise program designed to reach desired caloric intake, while maintaining appropriate intake of nutrient and fiber, sodium and fats, and appropriate energy expenditure required for the weight goal.;Weight Management: Provide education and appropriate resources to help participant work on and attain dietary goals.;Weight Management/Obesity: Establish reasonable short term and long term weight goals.;Obesity: Provide education and appropriate resources to help participant work on and attain dietary goals.    Admit Weight 239 lb 3.2 oz (108.5 kg)    Goal Weight: Long Term 200 lb (90.7 kg)   pt goal   Expected Outcomes Short Term: Continue to assess and modify interventions until short term weight is achieved;Long Term: Adherence to nutrition and physical activity/exercise program aimed toward attainment of established weight goal;Weight Loss: Understanding of general recommendations for a balanced deficit meal plan, which promotes 1-2 lb weight loss per week and includes a negative energy balance of [phone] kcal/d;Understanding recommendations for meals to include 15-35% energy as protein, 25-35% energy from fat, 35-60% energy from carbohydrates, less than 200mg  of dietary  cholesterol, 20-35 gm of total fiber daily;Understanding of distribution of calorie intake throughout the day with the consumption of 4-5 meals/snacks    Diabetes Yes    Intervention Provide education about signs/symptoms and action to take for hypo/hyperglycemia.;Provide education about proper nutrition, including hydration, and aerobic/resistive exercise prescription along with prescribed medications to achieve blood glucose in normal ranges: Fasting glucose 65-99 mg/dL    Expected Outcomes Short Term: Participant verbalizes understanding of the signs/symptoms and immediate care of hyper/hypoglycemia, proper foot care and importance of medication, aerobic/resistive exercise and nutrition plan for blood glucose control.;Long Term: Attainment of HbA1C < 7%.    Hypertension Yes    Intervention Provide education on lifestyle modifcations including regular physical activity/exercise, weight management, moderate sodium restriction and increased consumption of fresh fruit, vegetables, and low fat dairy, alcohol moderation, and smoking cessation.;Monitor prescription use compliance.    Expected Outcomes Short Term: Continued assessment and intervention until BP is < 140/60mm HG in  hypertensive participants. < 130/2mm HG in hypertensive participants with diabetes, heart failure or chronic kidney disease.;Long Term: Maintenance of blood pressure at goal levels.    Lipids Yes    Intervention Provide education and support for participant on nutrition & aerobic/resistive exercise along with prescribed medications to achieve LDL 70mg , HDL >40mg .    Expected Outcomes Short Term: Participant states understanding of desired cholesterol values and is compliant with medications prescribed. Participant is following exercise prescription and nutrition guidelines.;Long Term: Cholesterol controlled with medications as prescribed, with individualized exercise RX and with personalized nutrition plan. Value goals: LDL < 70mg ,  HDL > 40 mg.    Stress Yes    Intervention Offer individual and/or small group education and counseling on adjustment to heart disease, stress management and health-related lifestyle change. Teach and support self-help strategies.;Refer participants experiencing significant psychosocial distress to appropriate mental health specialists for further evaluation and treatment. When possible, include family members and significant others in education/counseling sessions.    Expected Outcomes Short Term: Participant demonstrates changes in health-related behavior, relaxation and other stress management skills, ability to obtain effective social support, and compliance with psychotropic medications if prescribed.;Long Term: Emotional wellbeing is indicated by absence of clinically significant psychosocial distress or social isolation.          Education:Diabetes - Individual verbal and written instruction to review signs/symptoms of diabetes, desired ranges of glucose level fasting, after meals and with exercise. Acknowledge that pre and post exercise glucose checks will be done for 3 sessions at entry of program.   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 Introduction to [facility] education/itensive cardiac rehab. Initial orientation packet reviewed with patient. 30 Day ITP Review.  Devon Wood started cardiac rehab on 06/05/24 and tolerated exercise well.         Comments: see ITP comments

## 2024-06-12 ENCOUNTER — Encounter (HOSPITAL_COMMUNITY)
Admission: RE | Admit: 2024-06-12 | Discharge: 2024-06-12 | Disposition: A | Discharge status: Home / Self Care | Source: Ambulatory Visit | Attending: Cardiovascular Disease | Admitting: Cardiovascular Disease

## 2024-06-12 DIAGNOSIS — Z951 Presence of aortocoronary bypass graft: Secondary | ICD-10-CM | POA: Diagnosis not present

## 2024-06-13 ENCOUNTER — Other Ambulatory Visit: Payer: Self-pay | Admitting: Physician Assistant

## 2024-06-14 ENCOUNTER — Encounter (HOSPITAL_COMMUNITY): Payer: Self-pay

## 2024-06-14 ENCOUNTER — Encounter (HOSPITAL_COMMUNITY)
Admission: RE | Admit: 2024-06-14 | Discharge: 2024-06-14 | Disposition: A | Discharge status: Home / Self Care | Source: Ambulatory Visit | Attending: Cardiovascular Disease | Admitting: Cardiovascular Disease

## 2024-06-14 ENCOUNTER — Other Ambulatory Visit (HOSPITAL_COMMUNITY): Payer: Self-pay

## 2024-06-14 ENCOUNTER — Telehealth: Payer: Self-pay | Admitting: Cardiovascular Disease

## 2024-06-14 DIAGNOSIS — Z951 Presence of aortocoronary bypass graft: Secondary | ICD-10-CM

## 2024-06-14 MED ORDER — CLOPIDOGREL BISULFATE 75 MG PO TABS
75.0000 mg | ORAL_TABLET | Freq: Every day | ORAL | 3 refills | Status: AC
  Filled 2024-06-14 – 2024-09-08 (×2): qty 90, 90d supply, fill #0

## 2024-06-14 MED ORDER — CLOPIDOGREL BISULFATE 75 MG PO TABS: 75.0000 mg | ORAL_TABLET | Freq: Every day | ORAL | 3 refills | Status: DC

## 2024-06-14 NOTE — Telephone Encounter (Signed)
 Patient walked in and says his provider denied a prescription refill of Plavix  - he says he was supposed to be on that prescription for a year.  Patient is sitting in Zone A.  Thank you.

## 2024-06-14 NOTE — Telephone Encounter (Signed)
 Pt's medication was sent to pt's pharmacy as requested. Confirmation received.

## 2024-06-14 NOTE — Addendum Note (Signed)
 Addended by: BLUFORD RAMP D on: 06/14/2024 02:57 PM   Modules accepted: Orders

## 2024-06-17 ENCOUNTER — Encounter (HOSPITAL_COMMUNITY)

## 2024-06-19 ENCOUNTER — Encounter (HOSPITAL_COMMUNITY)
Admission: RE | Admit: 2024-06-19 | Discharge: 2024-06-19 | Disposition: A | Source: Ambulatory Visit | Attending: Cardiovascular Disease

## 2024-06-19 DIAGNOSIS — Z951 Presence of aortocoronary bypass graft: Secondary | ICD-10-CM

## 2024-06-21 ENCOUNTER — Encounter (HOSPITAL_COMMUNITY)
Admission: RE | Admit: 2024-06-21 | Discharge: 2024-06-21 | Disposition: A | Discharge status: Home / Self Care | Source: Ambulatory Visit | Attending: Cardiovascular Disease | Admitting: Cardiovascular Disease

## 2024-06-21 DIAGNOSIS — Z951 Presence of aortocoronary bypass graft: Secondary | ICD-10-CM | POA: Diagnosis not present

## 2024-06-24 ENCOUNTER — Encounter (HOSPITAL_COMMUNITY)
Admission: RE | Admit: 2024-06-24 | Discharge: 2024-06-24 | Disposition: A | Discharge status: Home / Self Care | Source: Ambulatory Visit | Attending: Cardiovascular Disease | Admitting: Cardiovascular Disease

## 2024-06-24 DIAGNOSIS — Z951 Presence of aortocoronary bypass graft: Secondary | ICD-10-CM

## 2024-06-26 ENCOUNTER — Encounter (HOSPITAL_COMMUNITY)
Admission: RE | Admit: 2024-06-26 | Discharge: 2024-06-26 | Disposition: A | Discharge status: Home / Self Care | Source: Ambulatory Visit | Attending: Cardiovascular Disease | Admitting: Cardiovascular Disease

## 2024-06-26 DIAGNOSIS — Z951 Presence of aortocoronary bypass graft: Secondary | ICD-10-CM

## 2024-06-26 LAB — GLUCOSE, CAPILLARY
Glucose-Capillary: 263 mg/dL — ABNORMAL HIGH (ref 70–99)
Glucose-Capillary: 184 mg/dL — ABNORMAL HIGH (ref 70–99)

## 2024-06-28 ENCOUNTER — Other Ambulatory Visit (HOSPITAL_COMMUNITY): Payer: Self-pay

## 2024-06-28 ENCOUNTER — Encounter (HOSPITAL_COMMUNITY)
Admission: RE | Admit: 2024-06-28 | Discharge: 2024-06-28 | Disposition: A | Discharge status: Home / Self Care | Source: Ambulatory Visit | Attending: Cardiovascular Disease | Admitting: Cardiovascular Disease

## 2024-06-28 DIAGNOSIS — Z951 Presence of aortocoronary bypass graft: Secondary | ICD-10-CM | POA: Diagnosis not present

## 2024-07-01 ENCOUNTER — Encounter (HOSPITAL_COMMUNITY)
Admission: RE | Admit: 2024-07-01 | Discharge: 2024-07-01 | Disposition: A | Discharge status: Home / Self Care | Source: Ambulatory Visit | Attending: Cardiovascular Disease | Admitting: Cardiovascular Disease

## 2024-07-01 DIAGNOSIS — Z951 Presence of aortocoronary bypass graft: Secondary | ICD-10-CM

## 2024-07-03 ENCOUNTER — Encounter (HOSPITAL_COMMUNITY)
Admission: RE | Admit: 2024-07-03 | Discharge: 2024-07-03 | Disposition: A | Discharge status: Home / Self Care | Source: Ambulatory Visit | Attending: Cardiovascular Disease | Admitting: Cardiovascular Disease

## 2024-07-03 DIAGNOSIS — Z951 Presence of aortocoronary bypass graft: Secondary | ICD-10-CM

## 2024-07-05 ENCOUNTER — Encounter (HOSPITAL_COMMUNITY)
Admission: RE | Admit: 2024-07-05 | Discharge: 2024-07-05 | Disposition: A | Discharge status: Home / Self Care | Source: Ambulatory Visit | Attending: Cardiovascular Disease | Admitting: Cardiovascular Disease

## 2024-07-05 DIAGNOSIS — Z951 Presence of aortocoronary bypass graft: Secondary | ICD-10-CM | POA: Diagnosis not present

## 2024-07-08 ENCOUNTER — Encounter (HOSPITAL_COMMUNITY)
Admission: RE | Admit: 2024-07-08 | Discharge: 2024-07-08 | Disposition: A | Discharge status: Home / Self Care | Source: Ambulatory Visit | Attending: Cardiovascular Disease | Admitting: Cardiovascular Disease

## 2024-07-08 DIAGNOSIS — Z951 Presence of aortocoronary bypass graft: Secondary | ICD-10-CM | POA: Insufficient documentation

## 2024-07-08 DIAGNOSIS — Z48812 Encounter for surgical aftercare following surgery on the circulatory system: Secondary | ICD-10-CM | POA: Diagnosis present

## 2024-07-09 NOTE — Progress Notes (Signed)
 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 (Patient taking differently: Take 30 mg by mouth daily. Taking once a day), Disp: , Rfl:    colchicine  0.6 MG tablet, Take 0.6 mg by mouth as needed (gout)., Disp: , Rfl:    empagliflozin  (JARDIANCE ) 25 MG TABS tablet, Take 1 tablet (25 mg total) by mouth daily before breakfast., Disp: 90 tablet, Rfl: 2   escitalopram  (LEXAPRO ) 10 MG tablet, Take 1 tablet (10 mg total) by mouth daily., Disp: 30 tablet, Rfl: 1   ezetimibe  (ZETIA ) 10 MG tablet, Take 1 tablet (10 mg total) by mouth daily., Disp: 90 tablet, Rfl: 3   famotidine  (PEPCID ) 20 MG tablet, Take 20 mg by mouth daily. , Disp: , Rfl:    Fe Fum-Vit C-Vit B12-FA (TRIGELS-F FORTE) CAPS capsule, Take 1 capsule by mouth daily after breakfast., Disp: , Rfl:    fluticasone  (FLONASE ) 50 MCG/ACT nasal spray, Place 2 sprays into both nostrils daily as needed for allergies., Disp: , Rfl:    meloxicam  (MOBIC ) 15 MG tablet, Take 15 mg by mouth daily., Disp: , Rfl:     metFORMIN  (GLUCOPHAGE ) 1000 MG tablet, Take 1,000 mg by mouth 2 (two) times daily with a meal., Disp: , Rfl:    metoprolol  tartrate (LOPRESSOR ) 25 MG tablet, Take 1 & 1/2 tablets (37.5 mg total) by mouth 2 (two) times daily., Disp: 270 tablet, Rfl: 1   nitroGLYCERIN  (NITROSTAT ) 0.4 MG SL tablet, Place 0.4 mg under the tongue every 5 (five) minutes as needed for chest pain., Disp: , Rfl:    rosuvastatin  (CRESTOR ) 20 MG tablet, Take 1 tablet (20 mg total) by mouth daily., Disp: 90 tablet, Rfl: 3   sitaGLIPtin (JANUVIA) 100 MG tablet, Take 50 mg by mouth daily., Disp: , Rfl:    Study - ARTEMIS - ziltivekimab 15 mg/0.5 mL or placebo SQ injection (PI-Christopher), Inject 0.5 mLs (15 mg total) into the skin every 28 (twenty-eight) days., Disp: 2 mL, Rfl: 0   traMADol  (ULTRAM ) 50 MG tablet, Take 1 tablet (50 mg total) by mouth every 6 (six) hours as needed for severe pain (pain score 7-10). (Patient not taking: Reported on 06/06/2024), Disp: 28 tablet, Rfl: 0 No current facility-administered medications for this encounter.  Facility-Administered Medications Ordered in Other Encounters:    [facility], Patient & Family Education, , Does not apply, Once, Shyrl Linnie KIDD, MD  Past Medical History: Past Medical History:  Diagnosis Date   Anxiety 08/02/2012   Arthritis  04/12/2012   Bronchitis 04/13/2012   hx of;last time couple of yrs ago   CAD (coronary artery disease) 10/10/2013   2004 2.0 x 25 mm drug-eluting cypher stent to LAD    Chronic back pain 09/24/2003   L3 buldging disc;DDD   CKD (chronic kidney disease), stage III (HCC) 09/07/2023   Complication of anesthesia 2004   20-30 second asystole episode during induction  04/2003,  was on b-blocker tx at the time, found to have 80% LAD lesion s/p stent (but unclear if CAD was the direct cause) -- pt has had surgeries since then and had no issues with anesthesia   Coronary artery disease 2004   stent placement, sees Dr JAYSON ponds  heart [facility]   Depression 2006   Diabetes mellitus 2005   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   DM type 2 causing complication (HCC) 10/10/2013   First degree heart block 09/06/2023   GERD (gastroesophageal reflux disease) 04/12/2012   takes Pepcid  daily   Gout 02/21/2007   hx of   History of kidney stones 04/13/2012   HTN (hypertension) 2005   Hypercholesteremia 04/13/2012   Joint pain 04/08/2012   Mixed hyperlipidemia 10/10/2013   Morbid obesity (HCC) 10/10/2013   Myocardial infarction (HCC) 09/06/2023   Nocturia 04/18/2012   Peptic ulcer 04/23/2012    Tobacco Use: Social History   Tobacco Use  Smoking Status Former   Current packs/day: 0.00   Average packs/day: 0.5 packs/day for 4.0 years (2.0 ttl pk-yrs)   Types: Cigarettes   Start date: 58   Quit date: 1984   Years since quitting: 41.8  Smokeless Tobacco Former   Types: Chew   Quit date: 1990    Labs: Review Flowsheet  More data exists      Latest Ref Rng & Units 12/13/2023 04/03/2024 04/04/2024 04/08/2024 04/10/2024  Labs for ITP Cardiac and Pulmonary Rehab  Cholestrol 0 - 200 mg/dL 73  - 56  - -  LDL (calc) 0 - 99 mg/dL 23  - 4  - -  HDL-C >59 mg/dL 28  - 24  - -  Trlycerides <150 mg/dL 876  - 860  - -  Hemoglobin A1c 4.8 - 5.6 % - 9.2  - 9.1  -  PH, Arterial 7.35 - 7.45 - - - - 7.305  7.305  7.279  7.369  7.364  7.386  7.325   PCO2 arterial 32 - 48 mmHg - - - - 46.0  42.6  51.0  35.6  36.9  38.0  38.8   Bicarbonate 20.0 - 28.0 mmol/L - - - - 22.6  20.9  23.9  20.6  21.0  22.8  21.4  20.2   TCO2 22 - 32 mmol/L - 20  - - 24  22  25  22  21  22  24  27  26  23  21  21  24    Acid-base deficit 0.0 - 2.0 mmol/L - - - - 3.0  5.0  3.0  4.0  4.0  2.0  5.0  5.0   O2 Saturation % - - - - 98  99  98  99  95  100  68  100     Details       Multiple values from one day are sorted in reverse-chronological order         Capillary Blood Glucose: Lab Results  Component Value Date   GLUCAP 184 (H)  06/26/2024   GLUCAP 263 (H) 06/26/2024  GLUCAP 136 (H) 06/07/2024   GLUCAP 157 (H) 06/07/2024   GLUCAP 154 (H) 06/05/2024     Exercise Target Goals: Exercise Program Goal: Individual exercise prescription set using results from initial 6 min walk test and THRR while considering  patient's activity barriers and safety.   Exercise Prescription Goal: Initial exercise prescription builds to 30-45 minutes a day of aerobic activity, 2-3 days per week.  Home exercise guidelines will be given to patient during program as part of exercise prescription that the participant will acknowledge.  Activity Barriers & Risk Stratification:  Activity Barriers & Cardiac Risk Stratification - 05/30/24 1157       Activity Barriers & Cardiac Risk Stratification   Activity Barriers Arthritis;Joint Problems;Back Problems;Neck/Spine Problems;Balance Concerns;Incisional Pain;Assistive Device    Cardiac Risk Stratification High   <5 METs on         6 Minute Walk:  6 Minute Walk     Row Name 05/30/24 1522         6 Minute Walk   Phase Initial     Distance 1025 feet     Walk Time 6 minutes     # of Rest Breaks 0     MPH 1.94     METS 2.3     RPE 11     Perceived Dyspnea  0     VO2 Peak 8.04     Symptoms Yes (comment)     Comments 4/10 back pain- chronic     Resting HR 81 bpm     Resting BP 108/79     Resting Oxygen Saturation  99 %     Exercise Oxygen Saturation  during 6 min walk 100 %     Max Ex. HR 83 bpm     Max Ex. BP 162/80     2 Minute Post BP 124/79        Oxygen Initial Assessment:   Oxygen Re-Evaluation:   Oxygen Discharge (Final Oxygen Re-Evaluation):   Initial Exercise Prescription:  Initial Exercise Prescription - 05/30/24 1200       Date of Initial Exercise RX and Referring Provider   Date 05/30/24    Referring Provider Jerel Balding, MD    Expected Discharge Date 08/23/24      NuStep   Level 1    SPM 80    Minutes 30    METs 2      Prescription  Details   Frequency (times per week) 3    Duration Progress to 30 minutes of continuous aerobic without signs/symptoms of physical distress      Intensity   THRR 40-80% of Max Heartrate 64-127    Ratings of Perceived Exertion 11-13    Perceived Dyspnea 0-4      Progression   Progression Continue progressive overload as per policy without signs/symptoms or physical distress.      Resistance Training   Training Prescription Yes    Weight 3    Reps 10-15          Perform Capillary Blood Glucose checks as needed.  Exercise Prescription Changes:   Exercise Prescription Changes     Row Name 06/05/24 1400 06/19/24 1500 07/03/24 1500 07/04/24 0900       Response to Exercise   Blood Pressure (Admit) 112/70 120/70 124/80 --    Blood Pressure (Exercise) 132/78 118/78 -- --    Blood Pressure (Exit) 136/68 108/60 112/72 --    Heart Rate (Admit) -- 87 bpm 81 bpm --  Heart Rate (Exercise) 106 bpm 98 bpm 11 bpm --    Heart Rate (Exit) 81 bpm 86 bpm 81 bpm --    Rating of Perceived Exertion (Exercise) 13 11 11  --    Symptoms None None None --    Comments Pt's first day in the CRP2 program Reviewed METs Reviewed METs and goals --    Duration Continue with 30 min of aerobic exercise without signs/symptoms of physical distress. Continue with 30 min of aerobic exercise without signs/symptoms of physical distress. Continue with 30 min of aerobic exercise without signs/symptoms of physical distress. --    Intensity THRR unchanged THRR unchanged THRR unchanged --      Progression   Progression Continue to progress workloads to maintain intensity without signs/symptoms of physical distress. Continue to progress workloads to maintain intensity without signs/symptoms of physical distress. Continue to progress workloads to maintain intensity without signs/symptoms of physical distress. --    Average METs 1.6 2.1 3 --      Resistance Training   Training Prescription No No No --    Weight No wts  on wednesdays No wts on wednesdays No wts on wednesdays --      Interval Training   Interval Training No No No --      NuStep   Level 1 3 4  --    SPM 66 82 93 --    Minutes 30 30 30  --    METs 1.6 2.1 3 --       Exercise Comments:   Exercise Comments     Row Name 06/05/24 1419 06/19/24 1500 07/03/24 1500       Exercise Comments Pt's first day in the CRP2 program. Pt exercised without complaints. Reviewed METs. Pt is making progress on METs and has increased his exercise workloads. Reviewed METs and goals. Pt has been able to increase workloads and METs. Progressing well.        Exercise Goals and Review:   Exercise Goals     Row Name 05/30/24 1158             Exercise Goals   Increase Physical Activity Yes       Intervention Provide advice, education, support and counseling about physical activity/exercise needs.;Develop an individualized exercise prescription for aerobic and resistive training based on initial evaluation findings, risk stratification, comorbidities and participant's personal goals.       Expected Outcomes Short Term: Attend rehab on a regular basis to increase amount of physical activity.;Long Term: Exercising regularly at least 3-5 days a week.;Long Term: Add in home exercise to make exercise part of routine and to increase amount of physical activity.       Increase Strength and Stamina Yes       Intervention Develop an individualized exercise prescription for aerobic and resistive training based on initial evaluation findings, risk stratification, comorbidities and participant's personal goals.;Provide advice, education, support and counseling about physical activity/exercise needs.       Expected Outcomes Long Term: Improve cardiorespiratory fitness, muscular endurance and strength as measured by increased METs and functional capacity ( );Short Term: Perform resistance training exercises routinely during rehab and add in resistance training at  home;Short Term: Increase workloads from initial exercise prescription for resistance, speed, and METs.       Able to understand and use rate of perceived exertion (RPE) scale Yes       Intervention Provide education and explanation on how to use RPE scale       Expected  Outcomes Short Term: Able to use RPE daily in rehab to express subjective intensity level;Long Term:  Able to use RPE to guide intensity level when exercising independently       Knowledge and understanding of Target Heart Rate Range (THRR) Yes       Intervention Provide education and explanation of THRR including how the numbers were predicted and where they are located for reference       Expected Outcomes Long Term: Able to use THRR to govern intensity when exercising independently;Short Term: Able to state/look up THRR;Short Term: Able to use daily as guideline for intensity in rehab       Understanding of Exercise Prescription Yes       Intervention Provide education, explanation, and written materials on patient's individual exercise prescription       Expected Outcomes Short Term: Able to explain program exercise prescription;Long Term: Able to explain home exercise prescription to exercise independently          Exercise Goals Re-Evaluation :  Exercise Goals Re-Evaluation     Row Name 06/05/24 1416 07/03/24 1500           Exercise Goal Re-Evaluation   Exercise Goals Review Increase Physical Activity;Understanding of Exercise Prescription;Increase Strength and Stamina;Knowledge and understanding of Target Heart Rate Range (THRR);Able to understand and use rate of perceived exertion (RPE) scale Increase Physical Activity;Understanding of Exercise Prescription;Increase Strength and Stamina;Knowledge and understanding of Target Heart Rate Range (THRR);Able to understand and use rate of perceived exertion (RPE) scale      Comments Pt's first day in the CRP2 program. Pt understands the exercise Rx, RPE scale and THRR.  Reviewed MET and goals. Pt voices progress on his goals of imrpoved strength and stamina; so much so that pt voices he has been able to go deer hunting. Pt has goal of weight loss and has losr 2.2 kg to date. Pt also has goal to imrpove his HgA1c. that has not been checked but pt reports that his blood sugars are more controlled.      Expected Outcomes Will continue to monitor patient and progress workloads as toelrated. Will continue to monitor patient and progress workloads as toelrated.         Discharge Exercise Prescription (Final Exercise Prescription Changes):  Exercise Prescription Changes - 07/04/24 0900       Response to Exercise   Blood Pressure (Admit) --    Blood Pressure (Exit) --    Heart Rate (Admit) --    Heart Rate (Exercise) --    Heart Rate (Exit) --    Rating of Perceived Exertion (Exercise) --    Symptoms --    Comments --    Duration --    Intensity --      Progression   Progression --    Average METs --      Resistance Training   Training Prescription --    Weight --      Interval Training   Interval Training --      NuStep   Level --    SPM --    Minutes --    METs --          Nutrition:  Target Goals: Understanding of nutrition guidelines, daily intake of sodium 1500mg , cholesterol 200mg , calories 30% from fat and 7% or less from saturated fats, daily to have 5 or more servings of fruits and vegetables.  Biometrics:  Pre Biometrics - 05/30/24 1157  Pre Biometrics   Waist Circumference 51 inches    Hip Circumference 44 inches    Waist to Hip Ratio 1.16 %    Triceps Skinfold 30 mm    % Body Fat 39.9 %    Grip Strength 14 kg    Flexibility 0 in   not done- back issues   Single Leg Stand 1.5 seconds           Nutrition Therapy Plan and Nutrition Goals:   Nutrition Assessments:  MEDIFICTS Score Key: >=70 Need to make dietary changes  40-70 Heart Healthy Diet <= 40 Therapeutic Level Cholesterol Diet   Flowsheet Row  INTENSIVE CARDIAC REHAB from 06/05/2024 in [facility]  Picture Your Plate Total Score on Admission 66   Picture Your Plate Scores: <59 Unhealthy dietary pattern with much room for improvement. 41-50 Dietary pattern unlikely to meet recommendations for good health and room for improvement. 51-60 More healthful dietary pattern, with some room for improvement.  >60 Healthy dietary pattern, although there may be some specific behaviors that could be improved.    Nutrition Goals Re-Evaluation:   Nutrition Goals Re-Evaluation:   Nutrition Goals Discharge (Final Nutrition Goals Re-Evaluation):   Psychosocial: Target Goals: Acknowledge presence or absence of significant depression and/or stress, maximize coping skills, provide positive support system. Participant is able to verbalize types and ability to use techniques and skills needed for reducing stress and depression.  Initial Review & Psychosocial Screening:  Initial Psych Review & Screening - 05/30/24 1524       Initial Review   Current issues with Current Stress Concerns    Source of Stress Concerns None Identified    Comments Oneil shared that he has low levels of stress relating to general life things. Nothing specific. Denies any feelings of depression/anxiety or need for additional resources at this time.      Family Dynamics   Good Support System? Yes   wife     Barriers   Psychosocial barriers to participate in program The patient should benefit from training in stress management and relaxation.      Screening Interventions   Interventions To provide support and resources with identified psychosocial needs;Provide feedback about the scores to participant;Encouraged to exercise    Expected Outcomes Short Term goal: Identification and review with participant of any Quality of Life or Depression concerns found by scoring the questionnaire.;Long Term Goal: Stressors  or current issues are controlled or eliminated.;Long Term goal: The participant improves quality of Life and PHQ9 Scores as seen by post scores and/or verbalization of changes          Quality of Life Scores:  Quality of Life - 05/30/24 1525       Quality of Life   Select Quality of Life      Quality of Life Scores   Health/Function Pre 22.9 %    Socioeconomic Pre 27.38 %    Psych/Spiritual Pre 22.64 %    Family Pre 25.2 %    GLOBAL Pre 24.2 %         Scores of 19 and below usually indicate a poorer quality of life in these areas.  A difference of  2-3 points is a clinically meaningful difference.  A difference of 2-3 points in the total score of the Quality of Life Index has been associated with significant improvement in overall quality of life, self-image, physical symptoms, and general health in studies assessing change in  quality of life.  PHQ-9: Review Flowsheet       07/03/2024 05/30/2024  Depression screen PHQ 2/9  Decreased Interest 0 2  Down, Depressed, Hopeless 0 0  PHQ - 2 Score 0 2  Altered sleeping 3 3  Tired, decreased energy 0 0  Change in appetite 0 0  Feeling bad or failure about yourself  0 0  Trouble concentrating 0 0  Moving slowly or fidgety/restless 0 0  Suicidal thoughts 0 0  PHQ-9 Score 3 5  Difficult doing work/chores Not difficult at all Not difficult at all   Interpretation of Total Score  Total Score Depression Severity:  1-4 = Minimal depression, 5-9 = Mild depression, 10-14 = Moderate depression, 15-19 = Moderately severe depression, 20-27 = Severe depression   Psychosocial Evaluation and Intervention:   Psychosocial Re-Evaluation:  Psychosocial Re-Evaluation     Row Name 07/05/24 1109             Psychosocial Re-Evaluation   Current issues with Current Stress Concerns       Comments Mark's PHQ-9 has decreased from a 5 at orientation to a 3 on 07/03/24.  He has not voiced any additional stressors/concerns during exercise at  cardiac rehab.       Expected Outcomes Oneil will report controlled/decreased psychosocial concerns/stressors by the end of cardiac rehab.       Continue Psychosocial Services  No Follow up required          Psychosocial Discharge (Final Psychosocial Re-Evaluation):  Psychosocial Re-Evaluation - 07/05/24 1109       Psychosocial Re-Evaluation   Current issues with Current Stress Concerns    Comments Mark's PHQ-9 has decreased from a 5 at orientation to a 3 on 07/03/24.  He has not voiced any additional stressors/concerns during exercise at cardiac rehab.    Expected Outcomes Oneil will report controlled/decreased psychosocial concerns/stressors by the end of cardiac rehab.    Continue Psychosocial Services  No Follow up required          Vocational Rehabilitation: Provide vocational rehab assistance to qualifying candidates.   Vocational Rehab Evaluation & Intervention:  Vocational Rehab - 05/30/24 1158       Initial Vocational Rehab Evaluation & Intervention   Assessment shows need for Vocational Rehabilitation No   retired/disability         Education: Education Goals: Education classes will be provided on a weekly basis, covering required topics. Participant will state understanding/return demonstration of topics presented.    Education     Row Name 06/05/24 1300     Education   Cardiac Education Topics Pritikin   Set Designer Exercise Physiologist   Weekly Topic Simple Sides and Sauces   Instruction Review Code 1- Verbalizes Understanding   Class Start Time 1400   Class Stop Time 1435   Class Time Calculation (min) 35 min    Row Name 06/12/24 1300     Education   Cardiac Education Topics Pritikin   Customer Service Manager   Weekly Topic Powerhouse Plant-Based Proteins   Instruction Review Code 1- Verbalizes Understanding   Class Start Time 1400   Class Stop Time 1436   Class  Time Calculation (min) 36 min    Row Name 06/19/24 1300     Education   Cardiac Education Topics Pritikin   [employer]  Educator Dietitian   Weekly Topic Tasty Appetizers and Snacks   Instruction Review Code 1- Verbalizes Understanding   Class Start Time 1400   Class Stop Time 1442   Class Time Calculation (min) 42 min    Row Name 06/21/24 1300     Education   Cardiac Education Topics Pritikin   [employer]     Workshops   Educator Exercise Physiologist   [employer] Exercise   Exercise Workshop Managing Heart Disease: Your Path to a Healthier Heart   Instruction Review Code 1- Verbalizes Understanding   Class Start Time 1405   Class Stop Time 1453   Class Time Calculation (min) 48 min    Row Name 06/26/24 1200     Education   Cardiac Education Topics Pritikin   Secondary School Teacher School   Educator Respiratory Therapist;Nurse   Weekly Topic Adding Flavor - Sodium-Free   Instruction Review Code 1- Verbalizes Understanding   Class Start Time 1357   Class Stop Time 1432   Class Time Calculation (min) 35 min    Row Name 07/01/24 1200     Education   Cardiac Education Topics Pritikin   [employer]     Workshops   Educator Exercise Physiologist   [employer] Exercise   Exercise Workshop Location Manager and Fall Prevention   Instruction Review Code 1- Verbalizes Understanding   Class Start Time 1408   Class Stop Time 1500   Class Time Calculation (min) 52 min    Row Name 07/03/24 1300     Education   Cardiac Education Topics Pritikin   Customer Service Manager   Weekly Topic Fast and Healthy Breakfasts   Instruction Review Code 1- Verbalizes Understanding   Class Start Time 1145   Class Stop Time 1225   Class Time Calculation (min) 40 min      Core Videos: Exercise    Move It!  Clinical staff conducted group or individual video education with verbal and written  material and guidebook.  Patient learns the recommended Pritikin exercise program. Exercise with the goal of living a long, healthy life. Some of the health benefits of exercise include controlled diabetes, healthier blood pressure levels, improved cholesterol levels, improved heart and lung capacity, improved sleep, and better body composition. Everyone should speak with their doctor before starting or changing an exercise routine.  Biomechanical Limitations Clinical staff conducted group or individual video education with verbal and written material and guidebook.  Patient learns how biomechanical limitations can impact exercise and how we can mitigate and possibly overcome limitations to have an impactful and balanced exercise routine.  Body Composition Clinical staff conducted group or individual video education with verbal and written material and guidebook.  Patient learns that body composition (ratio of muscle mass to fat mass) is a key component to assessing overall fitness, rather than body weight alone. Increased fat mass, especially visceral belly fat, can put us  at increased risk for metabolic syndrome, type 2 diabetes, heart disease, and even death. It is recommended to combine diet and exercise (cardiovascular and resistance training) to improve your body composition. Seek guidance from your physician and exercise physiologist before implementing an exercise routine.  Exercise Action Plan Clinical staff conducted group or individual video education with verbal and written material and guidebook.  Patient learns the recommended strategies to achieve and enjoy long-term exercise adherence, including variety, self-motivation, self-efficacy, and positive decision making. Benefits of  exercise include fitness, good health, weight management, more energy, better sleep, less stress, and overall well-being.  Medical   Heart Disease Risk Reduction Clinical staff conducted group or individual  video education with verbal and written material and guidebook.  Patient learns our heart is our most vital organ as it circulates oxygen, nutrients, white blood cells, and hormones throughout the entire body, and carries waste away. Data supports a plant-based eating plan like the Pritikin Program for its effectiveness in slowing progression of and reversing heart disease. The video provides a number of recommendations to address heart disease.   Metabolic Syndrome and Belly Fat  Clinical staff conducted group or individual video education with verbal and written material and guidebook.  Patient learns what metabolic syndrome is, how it leads to heart disease, and how one can reverse it and keep it from coming back. You have metabolic syndrome if you have 3 of the following 5 criteria: abdominal obesity, high blood pressure, high triglycerides, low HDL cholesterol, and high blood sugar.  Hypertension and Heart Disease Clinical staff conducted group or individual video education with verbal and written material and guidebook.  Patient learns that high blood pressure, or hypertension, is very common in the [place] . Hypertension is largely due to excessive salt intake, but other important risk factors include being overweight, physical inactivity, drinking too much alcohol, smoking, and not eating enough potassium from fruits and vegetables. High blood pressure is a leading risk factor for heart attack, stroke, congestive heart failure, dementia, kidney failure, and premature death. Long-term effects of excessive salt intake include stiffening of the arteries and thickening of heart muscle and organ damage. Recommendations include ways to reduce hypertension and the risk of heart disease.  Diseases of Our Time - Focusing on Diabetes Clinical staff conducted group or individual video education with verbal and written material and guidebook.  Patient learns why the best way to stop diseases of our  time is prevention, through food and other lifestyle changes. Medicine (such as prescription pills and surgeries) is often only a Band-Aid on the problem, not a long-term solution. Most common diseases of our time include obesity, type 2 diabetes, hypertension, heart disease, and cancer. The Pritikin Program is recommended and has been proven to help reduce, reverse, and/or prevent the damaging effects of metabolic syndrome.  Nutrition   Overview of the Pritikin Eating Plan  Clinical staff conducted group or individual video education with verbal and written material and guidebook.  Patient learns about the Pritikin Eating Plan for disease risk reduction. The Pritikin Eating Plan emphasizes a wide variety of unrefined, minimally-processed carbohydrates, like fruits, vegetables, whole grains, and legumes. Go, Caution, and Stop food choices are explained. Plant-based and lean animal proteins are emphasized. Rationale provided for low sodium intake for blood pressure control, low added sugars for blood sugar stabilization, and low added fats and oils for coronary artery disease risk reduction and weight management.  Calorie Density  Clinical staff conducted group or individual video education with verbal and written material and guidebook.  Patient learns about calorie density and how it impacts the Pritikin Eating Plan. Knowing the characteristics of the food you choose will help you decide whether those foods will lead to weight gain or weight loss, and whether you want to consume more or less of them. Weight loss is usually a side effect of the Pritikin Eating Plan because of its focus on low calorie-dense foods.  Label Reading  Clinical staff conducted group or individual video education  with verbal and written material and guidebook.  Patient learns about the Pritikin recommended label reading guidelines and corresponding recommendations regarding calorie density, added sugars, sodium content, and  whole grains.  Dining Out - Part 1  Clinical staff conducted group or individual video education with verbal and written material and guidebook.  Patient learns that restaurant meals can be sabotaging because they can be so high in calories, fat, sodium, and/or sugar. Patient learns recommended strategies on how to positively address this and avoid unhealthy pitfalls.  Facts on Fats  Clinical staff conducted group or individual video education with verbal and written material and guidebook.  Patient learns that lifestyle modifications can be just as effective, if not more so, as many medications for lowering your risk of heart disease. A Pritikin lifestyle can help to reduce your risk of inflammation and atherosclerosis (cholesterol build-up, or plaque, in the artery walls). Lifestyle interventions such as dietary choices and physical activity address the cause of atherosclerosis. A review of the types of fats and their impact on blood cholesterol levels, along with dietary recommendations to reduce fat intake is also included.  Nutrition Action Plan  Clinical staff conducted group or individual video education with verbal and written material and guidebook.  Patient learns how to incorporate Pritikin recommendations into their lifestyle. Recommendations include planning and keeping personal health goals in mind as an important part of their success.  Healthy Mind-Set    Healthy Minds, Bodies, Hearts  Clinical staff conducted group or individual video education with verbal and written material and guidebook.  Patient learns how to identify when they are stressed. Video will discuss the impact of that stress, as well as the many benefits of stress management. Patient will also be introduced to stress management techniques. The way we think, act, and feel has an impact on our hearts.  How Our Thoughts Can Heal Our Hearts  Clinical staff conducted group or individual video education with verbal and  written material and guidebook.  Patient learns that negative thoughts can cause depression and anxiety. This can result in negative lifestyle behavior and serious health problems. Cognitive behavioral therapy is an effective method to help control our thoughts in order to change and improve our emotional outlook.  Additional Videos:  Exercise    Improving Performance  Clinical staff conducted group or individual video education with verbal and written material and guidebook.  Patient learns to use a non-linear approach by alternating intensity levels and lengths of time spent exercising to help burn more calories and lose more body fat. Cardiovascular exercise helps improve heart health, metabolism, hormonal balance, blood sugar control, and recovery from fatigue. Resistance training improves strength, endurance, balance, coordination, reaction time, metabolism, and muscle mass. Flexibility exercise improves circulation, posture, and balance. Seek guidance from your physician and exercise physiologist before implementing an exercise routine and learn your capabilities and proper form for all exercise.  Introduction to Yoga  Clinical staff conducted group or individual video education with verbal and written material and guidebook.  Patient learns about yoga, a discipline of the coming together of mind, breath, and body. The benefits of yoga include improved flexibility, improved range of motion, better posture and core strength, increased lung function, weight loss, and positive self-image. Yoga's heart health benefits include lowered blood pressure, healthier heart rate, decreased cholesterol and triglyceride levels, improved immune function, and reduced stress. Seek guidance from your physician and exercise physiologist before implementing an exercise routine and learn your capabilities and proper form for  all exercise.  Medical   Aging: Enhancing Your Quality of Life  Clinical staff conducted  group or individual video education with verbal and written material and guidebook.  Patient learns key strategies and recommendations to stay in good physical health and enhance quality of life, such as prevention strategies, having an advocate, securing a Health Care Proxy and Power of Attorney, and keeping a list of medications and system for tracking them. It also discusses how to avoid risk for bone loss.  Biology of Weight Control  Clinical staff conducted group or individual video education with verbal and written material and guidebook.  Patient learns that weight gain occurs because we consume more calories than we burn (eating more, moving less). Even if your body weight is normal, you may have higher ratios of fat compared to muscle mass. Too much body fat puts you at increased risk for cardiovascular disease, heart attack, stroke, type 2 diabetes, and obesity-related cancers. In addition to exercise, following the Pritikin Eating Plan can help reduce your risk.  Decoding Lab Results  Clinical staff conducted group or individual video education with verbal and written material and guidebook.  Patient learns that lab test reflects one measurement whose values change over time and are influenced by many factors, including medication, stress, sleep, exercise, food, hydration, pre-existing medical conditions, and more. It is recommended to use the knowledge from this video to become more involved with your lab results and evaluate your numbers to speak with your doctor.   Diseases of Our Time - Overview  Clinical staff conducted group or individual video education with verbal and written material and guidebook.  Patient learns that according to the CDC, 50% to 70% of chronic diseases (such as obesity, type 2 diabetes, elevated lipids, hypertension, and heart disease) are avoidable through lifestyle improvements including healthier food choices, listening to satiety cues, and increased physical  activity.  Sleep Disorders Clinical staff conducted group or individual video education with verbal and written material and guidebook.  Patient learns how good quality and duration of sleep are important to overall health and well-being. Patient also learns about sleep disorders and how they impact health along with recommendations to address them, including discussing with a physician.  Nutrition  Dining Out - Part 2 Clinical staff conducted group or individual video education with verbal and written material and guidebook.  Patient learns how to plan ahead and communicate in order to maximize their dining experience in a healthy and nutritious manner. Included are recommended food choices based on the type of restaurant the patient is visiting.   Fueling a Banker conducted group or individual video education with verbal and written material and guidebook.  There is a strong connection between our food choices and our health. Diseases like obesity and type 2 diabetes are very prevalent and are in large-part due to lifestyle choices. The Pritikin Eating Plan provides plenty of food and hunger-curbing satisfaction. It is easy to follow, affordable, and helps reduce health risks.  Menu Workshop  Clinical staff conducted group or individual video education with verbal and written material and guidebook.  Patient learns that restaurant meals can sabotage health goals because they are often packed with calories, fat, sodium, and sugar. Recommendations include strategies to plan ahead and to communicate with the manager, chef, or server to help order a healthier meal.  Planning Your Eating Strategy  Clinical staff conducted group or individual video education with verbal and written material and guidebook.  Patient  learns about the Pritikin Eating Plan and its benefit of reducing the risk of disease. The Pritikin Eating Plan does not focus on calories. Instead, it emphasizes  high-quality, nutrient-rich foods. By knowing the characteristics of the foods, we choose, we can determine their calorie density and make informed decisions.  Targeting Your Nutrition Priorities  Clinical staff conducted group or individual video education with verbal and written material and guidebook.  Patient learns that lifestyle habits have a tremendous impact on disease risk and progression. This video provides eating and physical activity recommendations based on your personal health goals, such as reducing LDL cholesterol, losing weight, preventing or controlling type 2 diabetes, and reducing high blood pressure.  Vitamins and Minerals  Clinical staff conducted group or individual video education with verbal and written material and guidebook.  Patient learns different ways to obtain key vitamins and minerals, including through a recommended healthy diet. It is important to discuss all supplements you take with your doctor.   Healthy Mind-Set    Smoking Cessation  Clinical staff conducted group or individual video education with verbal and written material and guidebook.  Patient learns that cigarette smoking and tobacco addiction pose a serious health risk which affects millions of people. Stopping smoking will significantly reduce the risk of heart disease, lung disease, and many forms of cancer. Recommended strategies for quitting are covered, including working with your doctor to develop a successful plan.  Culinary   Becoming a Set Designer conducted group or individual video education with verbal and written material and guidebook.  Patient learns that cooking at home can be healthy, cost-effective, quick, and puts them in control. Keys to cooking healthy recipes will include looking at your recipe, assessing your equipment needs, planning ahead, making it simple, choosing cost-effective seasonal ingredients, and limiting the use of added fats, salts, and  sugars.  Cooking - Breakfast and Snacks  Clinical staff conducted group or individual video education with verbal and written material and guidebook.  Patient learns how important breakfast is to satiety and nutrition through the entire day. Recommendations include key foods to eat during breakfast to help stabilize blood sugar levels and to prevent overeating at meals later in the day. Planning ahead is also a key component.  Cooking - Educational Psychologist conducted group or individual video education with verbal and written material and guidebook.  Patient learns eating strategies to improve overall health, including an approach to cook more at home. Recommendations include thinking of animal protein as a side on your plate rather than center stage and focusing instead on lower calorie dense options like vegetables, fruits, whole grains, and plant-based proteins, such as beans. Making sauces in large quantities to freeze for later and leaving the skin on your vegetables are also recommended to maximize your experience.  Cooking - Healthy Salads and Dressing Clinical staff conducted group or individual video education with verbal and written material and guidebook.  Patient learns that vegetables, fruits, whole grains, and legumes are the foundations of the Pritikin Eating Plan. Recommendations include how to incorporate each of these in flavorful and healthy salads, and how to create homemade salad dressings. Proper handling of ingredients is also covered. Cooking - Soups and [employer] - Soups and Desserts Clinical staff conducted group or individual video education with verbal and written material and guidebook.  Patient learns that Pritikin soups and desserts make for easy, nutritious, and delicious snacks and meal components that are low in  sodium, fat, sugar, and calorie density, while high in vitamins, minerals, and filling fiber. Recommendations include simple and healthy  ideas for soups and desserts.   Overview     The Pritikin Solution Program Overview Clinical staff conducted group or individual video education with verbal and written material and guidebook.  Patient learns that the results of the Pritikin Program have been documented in more than 100 articles published in peer-reviewed journals, and the benefits include reducing risk factors for (and, in some cases, even reversing) high cholesterol, high blood pressure, type 2 diabetes, obesity, and more! An overview of the three key pillars of the Pritikin Program will be covered: eating well, doing regular exercise, and having a healthy mind-set.  WORKSHOPS  Exercise: Exercise Basics: Building Your Action Plan Clinical staff led group instruction and group discussion with PowerPoint presentation and patient guidebook. To enhance the learning environment the use of posters, models and videos may be added. At the conclusion of this workshop, patients will comprehend the difference between physical activity and exercise, as well as the benefits of incorporating both, into their routine. Patients will understand the FITT (Frequency, Intensity, Time, and Type) principle and how to use it to build an exercise action plan. In addition, safety concerns and other considerations for exercise and cardiac rehab will be addressed by the presenter. The purpose of this lesson is to promote a comprehensive and effective weekly exercise routine in order to improve patients' overall level of fitness.   Managing Heart Disease: Your Path to a Healthier Heart Clinical staff led group instruction and group discussion with PowerPoint presentation and patient guidebook. To enhance the learning environment the use of posters, models and videos may be added.At the conclusion of this workshop, patients will understand the anatomy and physiology of the heart. Additionally, they will understand how Pritikin's three pillars impact the  risk factors, the progression, and the management of heart disease.  The purpose of this lesson is to provide a high-level overview of the heart, heart disease, and how the Pritikin lifestyle positively impacts risk factors.  Exercise Biomechanics Clinical staff led group instruction and group discussion with PowerPoint presentation and patient guidebook. To enhance the learning environment the use of posters, models and videos may be added. Patients will learn how the structural parts of their bodies function and how these functions impact their daily activities, movement, and exercise. Patients will learn how to promote a neutral spine, learn how to manage pain, and identify ways to improve their physical movement in order to promote healthy living. The purpose of this lesson is to expose patients to common physical limitations that impact physical activity. Participants will learn practical ways to adapt and manage aches and pains, and to minimize their effect on regular exercise. Patients will learn how to maintain good posture while sitting, walking, and lifting.  Balance Training and Fall Prevention  Clinical staff led group instruction and group discussion with PowerPoint presentation and patient guidebook. To enhance the learning environment the use of posters, models and videos may be added. At the conclusion of this workshop, patients will understand the importance of their sensorimotor skills (vision, proprioception, and the vestibular system) in maintaining their ability to balance as they age. Patients will apply a variety of balancing exercises that are appropriate for their current level of function. Patients will understand the common causes for poor balance, possible solutions to these problems, and ways to modify their physical environment in order to minimize their fall risk. The purpose  of this lesson is to teach patients about the importance of maintaining balance as they age  and ways to minimize their risk of falling.  WORKSHOPS   Nutrition:  Fueling a Ship Broker led group instruction and group discussion with PowerPoint presentation and patient guidebook. To enhance the learning environment the use of posters, models and videos may be added. Patients will review the foundational principles of the Pritikin Eating Plan and understand what constitutes a serving size in each of the food groups. Patients will also learn Pritikin-friendly foods that are better choices when away from home and review make-ahead meal and snack options. Calorie density will be reviewed and applied to three nutrition priorities: weight maintenance, weight loss, and weight gain. The purpose of this lesson is to reinforce (in a group setting) the key concepts around what patients are recommended to eat and how to apply these guidelines when away from home by planning and selecting Pritikin-friendly options. Patients will understand how calorie density may be adjusted for different weight management goals.  Mindful Eating  Clinical staff led group instruction and group discussion with PowerPoint presentation and patient guidebook. To enhance the learning environment the use of posters, models and videos may be added. Patients will briefly review the concepts of the Pritikin Eating Plan and the importance of low-calorie dense foods. The concept of mindful eating will be introduced as well as the importance of paying attention to internal hunger signals. Triggers for non-hunger eating and techniques for dealing with triggers will be explored. The purpose of this lesson is to provide patients with the opportunity to review the basic principles of the Pritikin Eating Plan, discuss the value of eating mindfully and how to measure internal cues of hunger and fullness using the Hunger Scale. Patients will also discuss reasons for non-hunger eating and learn strategies to use for controlling  emotional eating.  Targeting Your Nutrition Priorities Clinical staff led group instruction and group discussion with PowerPoint presentation and patient guidebook. To enhance the learning environment the use of posters, models and videos may be added. Patients will learn how to determine their genetic susceptibility to disease by reviewing their family history. Patients will gain insight into the importance of diet as part of an overall healthy lifestyle in mitigating the impact of genetics and other environmental insults. The purpose of this lesson is to provide patients with the opportunity to assess their personal nutrition priorities by looking at their family history, their own health history and current risk factors. Patients will also be able to discuss ways of prioritizing and modifying the Pritikin Eating Plan for their highest risk areas  Menu  Clinical staff led group instruction and group discussion with PowerPoint presentation and patient guidebook. To enhance the learning environment the use of posters, models and videos may be added. Using menus brought in from [employer], or printed from [employer], patients will apply the Pritikin dining out guidelines that were presented in the [employer] video. Patients will also be able to practice these guidelines in a variety of provided scenarios. The purpose of this lesson is to provide patients with the opportunity to practice hands-on learning of the Pritikin Dining Out guidelines with actual menus and practice scenarios.  Label Reading Clinical staff led group instruction and group discussion with PowerPoint presentation and patient guidebook. To enhance the learning environment the use of posters, models and videos may be added. Patients will review and discuss the Pritikin label reading guidelines presented  in Pritikin's Label Reading Educational series video. Using fool labels brought in from local grocery stores  and markets, patients will apply the label reading guidelines and determine if the packaged food meet the Pritikin guidelines. The purpose of this lesson is to provide patients with the opportunity to review, discuss, and practice hands-on learning of the Pritikin Label Reading guidelines with actual packaged food labels. Cooking School  [employer] are designed to teach patients ways to prepare quick, simple, and affordable recipes at home. The importance of nutrition's role in chronic disease risk reduction is reflected in its emphasis in the overall [facility] program. By learning how to prepare essential core [employer] Eating Plan recipes, patients will increase control over what they eat; be able to customize the flavor of foods without the use of added salt, sugar, or fat; and improve the quality of the food they consume. By learning a set of core recipes which are easily assembled, quickly prepared, and affordable, patients are more likely to prepare more healthy foods at home. These workshops focus on convenient breakfasts, simple entres, side dishes, and desserts which can be prepared with minimal effort and are consistent with nutrition recommendations for cardiovascular risk reduction. Cooking [employer] are taught by a armed forces logistics/support/administrative officer (RD) who has been trained by the [employer]. The chef or RD has a clear understanding of the importance of minimizing - if not completely eliminating - added fat, sugar, and sodium in recipes. Throughout the series of Cooking School Workshop sessions, patients will learn about healthy ingredients and efficient methods of cooking to build confidence in their capability to prepare    Cooking School weekly topics:  Adding Flavor- Sodium-Free  Fast and Healthy Breakfasts  Powerhouse Plant-Based Proteins  Satisfying Salads and Dressings  Simple Sides and Sauces  International Cuisine-Spotlight on the [employer]  Zones  Delicious Desserts  Savory Soups  [employer] - Meals in a Astronomer Appetizers and Snacks  Comforting Weekend Breakfasts  One-Pot Wonders   Fast Evening Meals  Landscape Architect Your Pritikin Plate  WORKSHOPS   Healthy Mindset (Psychosocial):  Focused Goals, Sustainable Changes Clinical staff led group instruction and group discussion with PowerPoint presentation and patient guidebook. To enhance the learning environment the use of posters, models and videos may be added. Patients will be able to apply effective goal setting strategies to establish at least one personal goal, and then take consistent, meaningful action toward that goal. They will learn to identify common barriers to achieving personal goals and develop strategies to overcome them. Patients will also gain an understanding of how our mind-set can impact our ability to achieve goals and the importance of cultivating a positive and growth-oriented mind-set. The purpose of this lesson is to provide patients with a deeper understanding of how to set and achieve personal goals, as well as the tools and strategies needed to overcome common obstacles which may arise along the way.  From Head to Heart: The Power of a Healthy Outlook  Clinical staff led group instruction and group discussion with PowerPoint presentation and patient guidebook. To enhance the learning environment the use of posters, models and videos may be added. Patients will be able to recognize and describe the impact of emotions and mood on physical health. They will discover the importance of self-care and explore self-care practices which may work for them. Patients will also learn how to utilize the 4 C's to cultivate a healthier outlook and  better manage stress and challenges. The purpose of this lesson is to demonstrate to patients how a healthy outlook is an essential part of maintaining good health, especially as they continue their  cardiac rehab journey.  Healthy Sleep for a Healthy Heart Clinical staff led group instruction and group discussion with PowerPoint presentation and patient guidebook. To enhance the learning environment the use of posters, models and videos may be added. At the conclusion of this workshop, patients will be able to demonstrate knowledge of the importance of sleep to overall health, well-being, and quality of life. They will understand the symptoms of, and treatments for, common sleep disorders. Patients will also be able to identify daytime and nighttime behaviors which impact sleep, and they will be able to apply these tools to help manage sleep-related challenges. The purpose of this lesson is to provide patients with a general overview of sleep and outline the importance of quality sleep. Patients will learn about a few of the most common sleep disorders. Patients will also be introduced to the concept of "sleep hygiene," and discover ways to self-manage certain sleeping problems through simple daily behavior changes. Finally, the workshop will motivate patients by clarifying the links between quality sleep and their goals of heart-healthy living.   Recognizing and Reducing Stress Clinical staff led group instruction and group discussion with PowerPoint presentation and patient guidebook. To enhance the learning environment the use of posters, models and videos may be added. At the conclusion of this workshop, patients will be able to understand the types of stress reactions, differentiate between acute and chronic stress, and recognize the impact that chronic stress has on their health. They will also be able to apply different coping mechanisms, such as reframing negative self-talk. Patients will have the opportunity to practice a variety of stress management techniques, such as deep abdominal breathing, progressive muscle relaxation, and/or guided imagery.  The purpose of this lesson is to educate  patients on the role of stress in their lives and to provide healthy techniques for coping with it.  Learning Barriers/Preferences:  Learning Barriers/Preferences - 05/30/24 1200       Learning Barriers/Preferences   Learning Preferences Audio;Computer/Internet;Written Material;Video;Verbal Instruction;Skilled Demonstration;Pictoral;Individual Instruction;Group Instruction          Education Topics:  Knowledge Questionnaire Score:  Knowledge Questionnaire Score - 05/30/24 1526       Knowledge Questionnaire Score   Pre Score 22/24          Core Components/Risk Factors/Patient Goals at Admission:  Personal Goals and Risk Factors at Admission - 05/30/24 1159       Core Components/Risk Factors/Patient Goals on Admission    Weight Management Yes;Obesity;Weight Loss    Intervention Weight Management: Develop a combined nutrition and exercise program designed to reach desired caloric intake, while maintaining appropriate intake of nutrient and fiber, sodium and fats, and appropriate energy expenditure required for the weight goal.;Weight Management: Provide education and appropriate resources to help participant work on and attain dietary goals.;Weight Management/Obesity: Establish reasonable short term and long term weight goals.;Obesity: Provide education and appropriate resources to help participant work on and attain dietary goals.    Admit Weight 239 lb 3.2 oz (108.5 kg)    Goal Weight: Long Term 200 lb (90.7 kg)   pt goal   Expected Outcomes Short Term: Continue to assess and modify interventions until short term weight is achieved;Long Term: Adherence to nutrition and physical activity/exercise program aimed toward attainment of established weight goal;Weight Loss: Understanding of general  recommendations for a balanced deficit meal plan, which promotes 1-2 lb weight loss per week and includes a negative energy balance of [phone] kcal/d;Understanding recommendations for meals to  include 15-35% energy as protein, 25-35% energy from fat, 35-60% energy from carbohydrates, less than 200mg  of dietary cholesterol, 20-35 gm of total fiber daily;Understanding of distribution of calorie intake throughout the day with the consumption of 4-5 meals/snacks    Diabetes Yes    Intervention Provide education about signs/symptoms and action to take for hypo/hyperglycemia.;Provide education about proper nutrition, including hydration, and aerobic/resistive exercise prescription along with prescribed medications to achieve blood glucose in normal ranges: Fasting glucose 65-99 mg/dL    Expected Outcomes Short Term: Participant verbalizes understanding of the signs/symptoms and immediate care of hyper/hypoglycemia, proper foot care and importance of medication, aerobic/resistive exercise and nutrition plan for blood glucose control.;Long Term: Attainment of HbA1C < 7%.    Hypertension Yes    Intervention Provide education on lifestyle modifcations including regular physical activity/exercise, weight management, moderate sodium restriction and increased consumption of fresh fruit, vegetables, and low fat dairy, alcohol moderation, and smoking cessation.;Monitor prescription use compliance.    Expected Outcomes Short Term: Continued assessment and intervention until BP is < 140/46mm HG in hypertensive participants. < 130/32mm HG in hypertensive participants with diabetes, heart failure or chronic kidney disease.;Long Term: Maintenance of blood pressure at goal levels.    Lipids Yes    Intervention Provide education and support for participant on nutrition & aerobic/resistive exercise along with prescribed medications to achieve LDL 70mg , HDL >40mg .    Expected Outcomes Short Term: Participant states understanding of desired cholesterol values and is compliant with medications prescribed. Participant is following exercise prescription and nutrition guidelines.;Long Term: Cholesterol controlled with  medications as prescribed, with individualized exercise RX and with personalized nutrition plan. Value goals: LDL < 70mg , HDL > 40 mg.    Stress Yes    Intervention Offer individual and/or small group education and counseling on adjustment to heart disease, stress management and health-related lifestyle change. Teach and support self-help strategies.;Refer participants experiencing significant psychosocial distress to appropriate mental health specialists for further evaluation and treatment. When possible, include family members and significant others in education/counseling sessions.    Expected Outcomes Short Term: Participant demonstrates changes in health-related behavior, relaxation and other stress management skills, ability to obtain effective social support, and compliance with psychotropic medications if prescribed.;Long Term: Emotional wellbeing is indicated by absence of clinically significant psychosocial distress or social isolation.          Core Components/Risk Factors/Patient Goals Review:   Goals and Risk Factor Review     Row Name 07/05/24 1104             Core Components/Risk Factors/Patient Goals Review   Personal Goals Review Diabetes;Hypertension;Lipids;Stress;Weight Management/Obesity       Review Pt doing well with exercise at cardiac rehab. Vital signs have been stable.Oneil has increased his METs over the last 30 days and lost 2.8kg since start of CR (06/05/24).       Expected Outcomes Pt will continue to participate in cardiac rehab for exercise, nutrition, and lifestyle modifications          Core Components/Risk Factors/Patient Goals at Discharge (Final Review):   Goals and Risk Factor Review - 07/05/24 1104       Core Components/Risk Factors/Patient Goals Review   Personal Goals Review Diabetes;Hypertension;Lipids;Stress;Weight Management/Obesity    Review Pt doing well with exercise at cardiac rehab. Vital signs have been stable.Oneil  has increased his METs  over the last 30 days and lost 2.8kg since start of CR (06/05/24).    Expected Outcomes Pt will continue to participate in cardiac rehab for exercise, nutrition, and lifestyle modifications          ITP Comments:  ITP Comments     Row Name 05/30/24 1036 06/05/24 1423 07/05/24 1103       ITP Comments Dr. Wilbert Bihari medical director. Introduction to pritikin education/itensive cardiac rehab. Initial orientation packet reviewed with patient. 30 Day ITP Review.  Oneil started cardiac rehab on 06/05/24 and tolerated exercise well. 30 Day ITP Review.  Oneil has good attendance and participation with exercise in cardiac rehab.        Comments: see ITP comments

## 2024-07-10 ENCOUNTER — Encounter (HOSPITAL_COMMUNITY)
Admission: RE | Admit: 2024-07-10 | Discharge: 2024-07-10 | Disposition: A | Discharge status: Home / Self Care | Source: Ambulatory Visit | Attending: Cardiovascular Disease | Admitting: Cardiovascular Disease

## 2024-07-10 DIAGNOSIS — Z951 Presence of aortocoronary bypass graft: Secondary | ICD-10-CM

## 2024-07-10 DIAGNOSIS — Z48812 Encounter for surgical aftercare following surgery on the circulatory system: Secondary | ICD-10-CM | POA: Diagnosis not present

## 2024-07-12 ENCOUNTER — Encounter (HOSPITAL_COMMUNITY)
Admission: RE | Admit: 2024-07-12 | Discharge: 2024-07-12 | Disposition: A | Discharge status: Home / Self Care | Source: Ambulatory Visit | Attending: Cardiovascular Disease | Admitting: Cardiovascular Disease

## 2024-07-12 DIAGNOSIS — Z951 Presence of aortocoronary bypass graft: Secondary | ICD-10-CM

## 2024-07-12 DIAGNOSIS — Z48812 Encounter for surgical aftercare following surgery on the circulatory system: Secondary | ICD-10-CM | POA: Diagnosis not present

## 2024-07-15 ENCOUNTER — Encounter (HOSPITAL_COMMUNITY): Admission: RE | Admit: 2024-07-15

## 2024-07-17 ENCOUNTER — Encounter (HOSPITAL_COMMUNITY)
Admission: RE | Admit: 2024-07-17 | Discharge: 2024-07-17 | Disposition: A | Discharge status: Home / Self Care | Source: Ambulatory Visit | Attending: Cardiovascular Disease | Admitting: Cardiovascular Disease

## 2024-07-17 DIAGNOSIS — Z951 Presence of aortocoronary bypass graft: Secondary | ICD-10-CM

## 2024-07-17 DIAGNOSIS — Z48812 Encounter for surgical aftercare following surgery on the circulatory system: Secondary | ICD-10-CM | POA: Diagnosis not present

## 2024-07-19 ENCOUNTER — Encounter (HOSPITAL_COMMUNITY): Admission: RE | Admit: 2024-07-19 | Source: Ambulatory Visit

## 2024-07-22 ENCOUNTER — Encounter (HOSPITAL_COMMUNITY)
Admission: RE | Admit: 2024-07-22 | Discharge: 2024-07-22 | Disposition: A | Discharge status: Home / Self Care | Source: Ambulatory Visit | Attending: Cardiovascular Disease | Admitting: Cardiovascular Disease

## 2024-07-22 DIAGNOSIS — Z951 Presence of aortocoronary bypass graft: Secondary | ICD-10-CM

## 2024-07-22 DIAGNOSIS — Z48812 Encounter for surgical aftercare following surgery on the circulatory system: Secondary | ICD-10-CM | POA: Diagnosis not present

## 2024-07-22 NOTE — Progress Notes (Signed)
 Pt c/o right calf pain since Saturday. Stated it felt like a bee sting, denies having gotten stung, states the bee sting-like pain is gone now but that his calf is still sore especially when walking and his heel hits the ground. +2 pedal pulse, < 3 seconds cap refill on R toes. Bilateral pins and needs in lower legs at time. No redness, discoloration, or swelling observed. Pt could not do calf extension exercise during warm ups, stated it felt like his right calf was pulling/tearing.  Onsite provider notified, who stated it does not appear to be a DVT, did not recommend a lower extremity doppler ultrasound at this time, but would instruct the pt to f/u with his PCP about possible neuropathic type pain.  Pt instructed as per onsite provider's recommendation.

## 2024-07-24 ENCOUNTER — Encounter (HOSPITAL_COMMUNITY)
Admission: RE | Admit: 2024-07-24 | Discharge: 2024-07-24 | Disposition: A | Discharge status: Home / Self Care | Source: Ambulatory Visit | Attending: Cardiovascular Disease | Admitting: Cardiovascular Disease

## 2024-07-24 DIAGNOSIS — Z48812 Encounter for surgical aftercare following surgery on the circulatory system: Secondary | ICD-10-CM | POA: Diagnosis not present

## 2024-07-24 DIAGNOSIS — Z951 Presence of aortocoronary bypass graft: Secondary | ICD-10-CM

## 2024-07-26 ENCOUNTER — Encounter (HOSPITAL_COMMUNITY)
Admission: RE | Admit: 2024-07-26 | Discharge: 2024-07-26 | Disposition: A | Discharge status: Home / Self Care | Source: Ambulatory Visit | Attending: Cardiovascular Disease | Admitting: Cardiovascular Disease

## 2024-07-26 DIAGNOSIS — Z48812 Encounter for surgical aftercare following surgery on the circulatory system: Secondary | ICD-10-CM | POA: Diagnosis not present

## 2024-07-26 DIAGNOSIS — Z951 Presence of aortocoronary bypass graft: Secondary | ICD-10-CM

## 2024-07-26 NOTE — Progress Notes (Signed)
 Reviewed home exercise Rx with patient today.  Encouraged warm-up, cool-down, and stretching. Reviewed THRR of  64-127 and keeping RPE between 11-13. Encouraged to hydrate with activity.  Reviewed weather parameters for temperature and humidity for safe exercise outdoors. Reviewed S/S to terminate exercise and when to call 911 vs MD. Reviewed the use of NTG and pt was encouraged to carry at all times. Pt encouraged to always carry a cell phone for safety when exercising outdoors. Pt verbalized understanding of the home exercise Rx and was provided a copy.  Alm Parkins MS, ACSM-CEP, CCRP

## 2024-07-29 ENCOUNTER — Encounter (HOSPITAL_COMMUNITY)
Admission: RE | Admit: 2024-07-29 | Discharge: 2024-07-29 | Disposition: A | Source: Ambulatory Visit | Attending: Cardiovascular Disease

## 2024-07-29 DIAGNOSIS — Z951 Presence of aortocoronary bypass graft: Secondary | ICD-10-CM

## 2024-07-29 DIAGNOSIS — Z48812 Encounter for surgical aftercare following surgery on the circulatory system: Secondary | ICD-10-CM | POA: Diagnosis not present

## 2024-07-30 ENCOUNTER — Encounter: Payer: Self-pay | Admitting: Cardiovascular Disease

## 2024-07-30 ENCOUNTER — Ambulatory Visit: Attending: Cardiovascular Disease | Admitting: Cardiovascular Disease

## 2024-07-30 VITALS — BP 131/76 | HR 75 | Resp 16 | Ht 66.0 in | Wt 238.0 lb

## 2024-07-30 DIAGNOSIS — E785 Hyperlipidemia, unspecified: Secondary | ICD-10-CM | POA: Diagnosis not present

## 2024-07-30 DIAGNOSIS — N1832 Chronic kidney disease, stage 3b: Secondary | ICD-10-CM | POA: Insufficient documentation

## 2024-07-30 DIAGNOSIS — E1122 Type 2 diabetes mellitus with diabetic chronic kidney disease: Secondary | ICD-10-CM | POA: Insufficient documentation

## 2024-07-30 DIAGNOSIS — I2510 Atherosclerotic heart disease of native coronary artery without angina pectoris: Secondary | ICD-10-CM | POA: Diagnosis not present

## 2024-07-30 DIAGNOSIS — I10 Essential (primary) hypertension: Secondary | ICD-10-CM | POA: Insufficient documentation

## 2024-07-30 NOTE — Patient Instructions (Signed)
 Medication Instructions:  No changes *If you need a refill on your cardiac medications before your next appointment, please call your pharmacy*  Lab Work: None ordered If you have labs (blood work) drawn today and your tests are completely normal, you will receive your results only by: MyChart Message (if you have MyChart) OR A paper copy in the mail If you have any lab test that is abnormal or we need to change your treatment, we will call you to review the results.  Testing/Procedures: None ordered  Follow-Up: At [facility], you and your health needs are our priority.  As part of our continuing mission to provide you with exceptional heart care, our providers are all part of one team.  This team includes your primary Cardiologist (physician) and Advanced Practice Providers or APPs (Physician Assistants and Nurse Practitioners) who all work together to provide you with the care you need, when you need it.  Your next appointment:    August 2026  Provider:   Jerel Balding, MD    We recommend signing up for the patient portal called MyChart.  Sign up information is provided on this After Visit Summary.  MyChart is used to connect with patients for Virtual Visits (Telemedicine).  Patients are able to view lab/test results, encounter notes, upcoming appointments, etc.  Non-urgent messages can be sent to your provider as well.   To learn more about what you can do with MyChart, go to [email or web address].

## 2024-07-30 NOTE — Progress Notes (Unsigned)
 Cardiology Office Note:    Date:  08/01/2024   ID:  Devon Wood, DOB 1960-12-01, MRN [id number]  PCP:  Ransom Other, MD  Cardiologist:  Jerel Balding, MD    Referring MD: Ransom Other, MD   Chief Complaint  Patient presents with   Coronary Artery Disease     History of Present Illness:    Devon Wood is a 63 y.o. male with a hx of Remote placement of a stent in the LAD artery in 2004, repeat stent to LAD and first diagonal in January 2025, presenting in July 2025 with LAD in-stent restenosis as well as progression of disease proximal to the stent in the ostial LAD, as well as into oblique marginal branches in the distal right coronary artery, eventually undergoing three-vessel CABG (LIMA-LAD, SVG-PDA, SVG-diagonal, endarterectomy of the LAD on 04/10/2024.    He is now roughly 3 months status post CABG and is halfway through cardiac rehab, which is helping a lot.  All his surgical incisions have healed well.  He has numbness in the right lower extremity at the saphenectomy start.  He is on dual antiplatelet therapy with aspirin  and clopidogrel  since he presented with an acute coronary syndrome.  He has not had bleeding issues.  He does not have problems or shortness of breath or angina with activity and feels that he is making progress.  He has not had problems with claudication or lower extremity edema, palpitations, dizziness or syncope.  He is enrolled in the Artemis trial of ziltivekimab (IL-6 ligand inhibitor).   Metabolic control is not great with a hemoglobin A1c of 9.1% in August, shortly after his surgery.  In the past usually had well-controlled glycemic values.  He is currently taking Jardiance  and Januvia and metformin .  His cholesterol numbers however are excellent with a total cholesterol 56, HDL 24, LDL 4 and triglycerides 139 on ezetimibe  and rosuvastatin  and possibly on the Artemis study drug.  These labs were drawn immediately before his bypass surgery during acute  illness.  Other problems include CKD stage IIIb with a creatinine most recently 2.05 (GFR around 35-40).  Mildly dilated ascending aorta at 4.0 cm, hypertension, severe degenerative spine disease (he has had back surgery twice with Dr. Onetha).  He has not had any recent gout attacks.  Past Medical History:  Diagnosis Date   Anxiety 08/02/2012   Arthritis 04/12/2012   Bronchitis 04/13/2012   hx of;last time couple of yrs ago   CAD (coronary artery disease) 10/10/2013   2004 2.0 x 25 mm drug-eluting cypher stent to LAD    Chronic back pain 09/24/2003   L3 buldging disc;DDD   CKD (chronic kidney disease), stage III (HCC) 09/07/2023   Complication of anesthesia 2004   20-30 second asystole episode during induction  04/2003,  was on b-blocker tx at the time, found to have 80% LAD lesion s/p stent (but unclear if CAD was the direct cause) -- pt has had surgeries since then and had no issues with anesthesia   Coronary artery disease 2004   stent placement, sees Dr JAYSON ponds  [facility]   Depression 2006   Diabetes mellitus 2005   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   DM type 2 causing complication (HCC) 10/10/2013   First degree heart block 09/06/2023   GERD (gastroesophageal reflux disease) 04/12/2012   takes Pepcid  daily   Gout 02/21/2007   hx of   History of kidney stones 04/13/2012   HTN (hypertension) 2005   Hypercholesteremia  04/13/2012   Joint pain 04/08/2012   Mixed hyperlipidemia 10/10/2013   Morbid obesity (HCC) 10/10/2013   Myocardial infarction (HCC) 09/06/2023   Nocturia 04/18/2012   Peptic ulcer 04/23/2012    Past Surgical History:  Procedure Laterality Date   ANTERIOR CERVICAL DECOMP/DISCECTOMY FUSION N/A 07/28/2021   Procedure: Anterior Cervical Decompression Fusion Cervical four-five,Cervical five-six;  Surgeon: Onetha Kuba, MD;  Location: [facility] OR;  Service: Neurosurgery;  Laterality: N/A;   BACK SURGERY     CARDIAC CATHETERIZATION  11/27/2009   widely  patent stent   COLONOSCOPY WITH PROPOFOL  N/A 03/17/2015   Procedure: COLONOSCOPY WITH PROPOFOL ;  Surgeon: Gladis MARLA Louder, MD;  Location: [facility] ENDOSCOPY;  Service: Endoscopy;  Laterality: N/A;   CORONARY ARTERY BYPASS GRAFT N/A 04/10/2024   Procedure: CORONARY ARTERY BYPASS GRAFTING (CABG) TIMES THREE USING LEFT INTERNAL MAMMARY ARTERY AND ENDOSCOPICALLY HARVESTED RIGHT GREATER SAPHENOUS VEIN;  Surgeon: Shyrl Linnie KIDD, MD;  Location: [facility] OR;  Service: Open Heart Surgery;  Laterality: N/A;   CORONARY STENT PLACEMENT  05/30/2003   proximal LAD   CORONARY/GRAFT ACUTE MI REVASCULARIZATION N/A 09/06/2023   Procedure: Coronary/Graft Acute MI Revascularization;  Surgeon: Elmira Newman PARAS, MD;  Location: [facility] INVASIVE CV LAB;  Service: Cardiovascular;  Laterality: N/A;   ESOPHAGOGASTRODUODENOSCOPY  in the 90's   HAND SURGERY  1980   right   INTRAOPERATIVE TRANSESOPHAGEAL ECHOCARDIOGRAM N/A 04/10/2024   Procedure: ECHOCARDIOGRAM, TRANSESOPHAGEAL, INTRAOPERATIVE;  Surgeon: Shyrl Linnie KIDD, MD;  Location: [facility] OR;  Service: Open Heart Surgery;  Laterality: N/A;   LEFT HEART CATH AND CORONARY ANGIOGRAPHY N/A 09/06/2023   Procedure: LEFT HEART CATH AND CORONARY ANGIOGRAPHY;  Surgeon: Elmira Newman PARAS, MD;  Location: [facility] CV [facility];  Service: Cardiovascular;  Laterality: N/A;   LEFT HEART CATH AND CORONARY ANGIOGRAPHY N/A 04/03/2024   Procedure: LEFT HEART CATH AND CORONARY ANGIOGRAPHY;  Surgeon: Elmira Newman PARAS, MD;  Location: [facility] INVASIVE CV LAB;  Service: Cardiovascular;  Laterality: N/A;   LUMBAR DISC SURGERY  2002   L5   LUMBAR FUSION  2004   NM MYOCAR PERF WALL MOTION  05/19/2004   no evidence of ischemia   POSTERIOR CERVICAL FUSION/FORAMINOTOMY N/A 08/11/2021   Procedure: Post Cervical Lami/Multi Level - Cervical four-Cervical five - Cervical five-Cervical six with lateral mass screw fixation;  Surgeon: Onetha Kuba, MD;  Location: [facility] OR;  Service: Neurosurgery;  Laterality: N/A;    TONSILLECTOMY  1969   US  ECHOCARDIOGRAPHY  04/30/2003   mildly dilated aortic root,mild LA enlargement,mild concentric LVH,trivial mitral and tricuspid regurg.,mild mitral annular ca+    Current Medications: Current Meds  Medication Sig   aspirin  81 MG tablet Take 81 mg by mouth daily.   clopidogrel  (PLAVIX ) 75 MG tablet Take 1 tablet (75 mg total) by mouth daily.   co-enzyme Q-10 30 MG capsule Take 30 mg by mouth 3 (three) times daily.   colchicine  0.6 MG tablet Take 0.6 mg by mouth as needed (gout).   empagliflozin  (JARDIANCE ) 25 MG TABS tablet Take 1 tablet (25 mg total) by mouth daily before breakfast.   escitalopram  (LEXAPRO ) 10 MG tablet Take 1 tablet (10 mg total) by mouth daily.   ezetimibe  (ZETIA ) 10 MG tablet Take 1 tablet (10 mg total) by mouth daily.   famotidine  (PEPCID ) 20 MG tablet Take 20 mg by mouth daily.    Fe Fum-Vit C-Vit B12-FA (TRIGELS-F FORTE) CAPS capsule Take 1 capsule by mouth daily after breakfast.   fluticasone  (FLONASE ) 50 MCG/ACT nasal spray Place 2 sprays into  both nostrils daily as needed for allergies.   meloxicam  (MOBIC ) 15 MG tablet Take 15 mg by mouth daily.   metFORMIN  (GLUCOPHAGE ) 1000 MG tablet Take 1,000 mg by mouth daily with breakfast.   metoprolol  tartrate (LOPRESSOR ) 25 MG tablet Take 1 & 1/2 tablets (37.5 mg total) by mouth 2 (two) times daily.   nitroGLYCERIN  (NITROSTAT ) 0.4 MG SL tablet Place 0.4 mg under the tongue every 5 (five) minutes as needed for chest pain.   rosuvastatin  (CRESTOR ) 20 MG tablet Take 1 tablet (20 mg total) by mouth daily.   sitaGLIPtin (JANUVIA) 100 MG tablet Take 50 mg by mouth daily.   Study - ARTEMIS - ziltivekimab 15 mg/0.5 mL or placebo SQ injection (PI-Christopher) Inject 0.5 mLs (15 mg total) into the skin every 28 (twenty-eight) days.   traMADol  (ULTRAM ) 50 MG tablet Take 1 tablet (50 mg total) by mouth every 6 (six) hours as needed for severe pain (pain score 7-10).     Allergies:   Erythromycin   Family  History: The patient's family history includes COPD in his brother, brother, and mother; Cancer in his maternal grandfather; Dementia in his maternal grandmother; Heart attack in his father and paternal grandfather; Heart failure in his paternal grandmother; Hypertension in his sister. There is no history of Anesthesia problems, Hypotension, Malignant hyperthermia, or Pseudochol deficiency. ROS:   Please see the history of present illness.     All other systems reviewed and are negative.  EKGs/Labs/Other Studies Reviewed:    The following studies were reviewed today: Notes from neurosurgery . EKG:  EKG is ordered today.  It is a completely normal tracing  Recent Labs: 04/08/2024: ALT 26 04/11/2024: Magnesium  2.5 04/15/2024: Hemoglobin 7.9; Platelets 192 05/02/2024: BUN 26; Creatinine, Ser 2.05; Potassium 5.2; Sodium 137  Recent Lipid Panel    Component Value Date/Time   CHOL 56 04/04/2024 0858   CHOL 73 (L) 12/13/2023 1104   TRIG 139 04/04/2024 0858   HDL 24 (L) 04/04/2024 0858   HDL 28 (L) 12/13/2023 1104   CHOLHDL 2.3 04/04/2024 0858   VLDL 28 04/04/2024 0858   LDLCALC 4 04/04/2024 0858   LDLCALC 23 12/13/2023 1104    Physical Exam:    VS:  BP 131/76   Pulse 75   Resp 16   Ht 5' 6 (1.676 m)   Wt 238 lb (108 kg)   SpO2 95%   BMI 38.41 kg/m     Wt Readings from Last 3 Encounters:  07/30/24 238 lb (108 kg)  05/30/24 239 lb 3.2 oz (108.5 kg)  05/17/24 237 lb 9.6 oz (107.8 kg)      General: Alert, oriented x3, no distress, obese. Head: no evidence of trauma, PERRL, EOMI, no exophtalmos or lid lag, no myxedema, no xanthelasma; normal ears, nose and oropharynx Neck: normal jugular venous pulsations and no hepatojugular reflux; brisk carotid pulses without delay and no carotid bruits Chest: clear to auscultation, no signs of consolidation by percussion or palpation, normal fremitus, symmetrical and full respiratory excursions.  Well-healed sternotomy scar. Cardiovascular:  normal position and quality of the apical impulse, regular rhythm, normal first and second heart sounds, no murmurs, rubs or gallops.  Right radial pulse appears weaker than the left. Abdomen: no tenderness or distention, no masses by palpation, no abnormal pulsatility or arterial bruits, normal bowel sounds, no hepatosplenomegaly Extremities: no clubbing, cyanosis or edema.  Well-healed saphenectomy scars. Neurological: grossly nonfocal Psych: Normal mood and affect   ASSESSMENT:    1. Coronary artery  disease involving native coronary artery of native heart without angina pectoris   2. Type 2 diabetes mellitus with stage 3b chronic kidney disease, without long-term current use of insulin  (HCC)   3. Essential hypertension   4. Hyperlipidemia LDL goal <55   5. Severe obesity (BMI 35.0-39.9) with comorbidity (HCC)    PLAN:    In order of problems listed above:  CAD: Recovering very well after CABG.  Does not have angina pectoris.  On dual antiplatelet therapy at least through January 2026 since he presented with an acute coronary event 09/06/2023.  I think we may just keep him on the clopidogrel  till his 1 year anniversary from his bypass surgery, unless there are bleeding issues DM: Glycemic control has deteriorated just in the last year following a lot of weight gain due to his back injury, although throughout the early 2020s he had very good glycemic control with hemoglobin A1c between 5.8 and 6.9.  Hopefully with increased physical activity and attention to diet we will get his glucose under better control.  Prefer to use GLP-1 agonist and SGLT2 inhibitors for treatment of his hyperglycemia. CKD3B: His creatinine has been a little higher ever since his presentation with non-STEMI in January 2025, but did not really worsen after bypass surgery.  Avoid regular use of NSAIDs.  He has a prescription for meloxicam  but has not used it in a while.  Prefer acetaminophen  for pain control. HTN:  Slightly elevated when he first checked in, but normal when I rechecked it at 131/76.  On metoprolol  and a fairly low dose. HLP: Very low cholesterol values.  Target LDL less than 55. Obesity: He gained a lot of weight after his back injury.  Patient Instructions  Medication Instructions:  No changes *If you need a refill on your cardiac medications before your next appointment, please call your pharmacy*  Lab Work: None ordered If you have labs (blood work) drawn today and your tests are completely normal, you will receive your results only by: MyChart Message (if you have MyChart) OR A paper copy in the mail If you have any lab test that is abnormal or we need to change your treatment, we will call you to review the results.  Testing/Procedures: None ordered  Follow-Up: At [facility], you and your health needs are our priority.  As part of our continuing mission to provide you with exceptional heart care, our providers are all part of one team.  This team includes your primary Cardiologist (physician) and Advanced Practice Providers or APPs (Physician Assistants and Nurse Practitioners) who all work together to provide you with the care you need, when you need it.  Your next appointment:    August 2026  Provider:   Jerel Balding, MD    We recommend signing up for the patient portal called MyChart.  Sign up information is provided on this After Visit Summary.  MyChart is used to connect with patients for Virtual Visits (Telemedicine).  Patients are able to view lab/test results, encounter notes, upcoming appointments, etc.  Non-urgent messages can be sent to your provider as well.   To learn more about what you can do with MyChart, go to [email or web address].      Signed, Jerel Balding, MD  08/01/2024 6:25 PM    [facility]

## 2024-07-31 ENCOUNTER — Encounter (HOSPITAL_COMMUNITY)
Admission: RE | Admit: 2024-07-31 | Discharge: 2024-07-31 | Disposition: A | Discharge status: Home / Self Care | Source: Ambulatory Visit | Attending: Cardiovascular Disease | Admitting: Cardiovascular Disease

## 2024-07-31 DIAGNOSIS — Z48812 Encounter for surgical aftercare following surgery on the circulatory system: Secondary | ICD-10-CM | POA: Diagnosis not present

## 2024-07-31 DIAGNOSIS — Z951 Presence of aortocoronary bypass graft: Secondary | ICD-10-CM

## 2024-08-01 ENCOUNTER — Encounter: Payer: Self-pay | Admitting: Cardiovascular Disease

## 2024-08-02 ENCOUNTER — Encounter (HOSPITAL_COMMUNITY)

## 2024-08-05 ENCOUNTER — Encounter (HOSPITAL_COMMUNITY)
Admission: RE | Admit: 2024-08-05 | Discharge: 2024-08-05 | Disposition: A | Source: Ambulatory Visit | Attending: Cardiovascular Disease

## 2024-08-05 DIAGNOSIS — Z951 Presence of aortocoronary bypass graft: Secondary | ICD-10-CM | POA: Diagnosis present

## 2024-08-06 NOTE — Progress Notes (Signed)
 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  Patient's Home Medications on Admission:  Current Outpatient Medications:    aspirin  81 MG tablet, Take 81 mg by mouth daily., Disp: , Rfl:    clopidogrel  (PLAVIX ) 75 MG tablet, Take 1 tablet (75 mg total) by mouth daily., Disp: 90 tablet, Rfl: 3   co-enzyme Q-10 30 MG capsule, Take 30 mg by mouth 3 (three) times daily., Disp: , Rfl:    colchicine  0.6 MG tablet, Take 0.6 mg by mouth as needed (gout)., Disp: , Rfl:    empagliflozin  (JARDIANCE ) 25 MG TABS tablet, Take 1 tablet (25 mg total) by mouth daily before breakfast., Disp: 90 tablet, Rfl: 2   escitalopram  (LEXAPRO ) 10 MG tablet, Take 1 tablet (10 mg total) by mouth daily., Disp: 30 tablet, Rfl: 1   ezetimibe  (ZETIA ) 10 MG tablet, Take 1 tablet (10 mg total) by mouth daily., Disp: 90 tablet, Rfl: 3   famotidine  (PEPCID ) 20 MG tablet, Take 20 mg by mouth daily. , Disp: , Rfl:    Fe Fum-Vit C-Vit B12-FA (TRIGELS-F FORTE) CAPS capsule, Take 1 capsule by mouth daily after breakfast., Disp: , Rfl:    fluticasone  (FLONASE ) 50 MCG/ACT nasal spray, Place 2 sprays into both nostrils daily as needed for allergies., Disp: , Rfl:    meloxicam  (MOBIC ) 15 MG tablet, Take 15 mg by mouth daily., Disp: , Rfl:    metFORMIN  (GLUCOPHAGE ) 1000 MG tablet, Take 1,000 mg by mouth daily with breakfast., Disp: , Rfl:    metoprolol  tartrate (LOPRESSOR ) 25 MG tablet, Take 1 & 1/2 tablets (37.5 mg total) by mouth 2 (two) times daily., Disp: 270  tablet, Rfl: 1   nitroGLYCERIN  (NITROSTAT ) 0.4 MG SL tablet, Place 0.4 mg under the tongue every 5 (five) minutes as needed for chest pain., Disp: , Rfl:    rosuvastatin  (CRESTOR ) 20 MG tablet, Take 1 tablet (20 mg total) by mouth daily., Disp: 90 tablet, Rfl: 3   sitaGLIPtin (JANUVIA) 100 MG tablet, Take 50 mg by mouth daily., Disp: , Rfl:    Study - ARTEMIS - ziltivekimab 15 mg/0.5 mL or placebo SQ injection (PI-Christopher), Inject 0.5 mLs (15 mg total) into the skin every 28 (twenty-eight) days., Disp: 2 mL, Rfl: 0   traMADol  (ULTRAM ) 50 MG tablet, Take 1 tablet (50 mg total) by mouth every 6 (six) hours as needed for severe pain (pain score 7-10)., Disp: 28 tablet, Rfl: 0 No current facility-administered medications for this encounter.  Facility-Administered Medications Ordered in Other Encounters:    [facility], Patient & Family Education, , Does not apply, Once, Shyrl Linnie KIDD, MD  Past Medical History: Past Medical History:  Diagnosis Date   Anxiety 08/02/2012   Arthritis 04/12/2012   Bronchitis 04/13/2012   hx of;last time couple of yrs ago   CAD (coronary artery disease) 10/10/2013   2004 2.0 x 25 mm drug-eluting cypher stent to LAD    Chronic back pain 09/24/2003   L3 buldging disc;DDD   CKD (chronic kidney disease), stage III (HCC)  09/07/2023   Complication of anesthesia 2004   20-30 second asystole episode during induction  04/2003,  was on b-blocker tx at the time, found to have 80% LAD lesion s/p stent (but unclear if CAD was the direct cause) -- pt has had surgeries since then and had no issues with anesthesia   Coronary artery disease 2004   stent placement, sees Dr JAYSON ponds  heart [facility]   Depression 2006   Diabetes mellitus 2005   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   DM type 2 causing complication (HCC) 10/10/2013   First degree heart block 09/06/2023   GERD (gastroesophageal reflux disease) 04/12/2012   takes Pepcid  daily   Gout  02/21/2007   hx of   History of kidney stones 04/13/2012   HTN (hypertension) 2005   Hypercholesteremia 04/13/2012   Joint pain 04/08/2012   Mixed hyperlipidemia 10/10/2013   Morbid obesity (HCC) 10/10/2013   Myocardial infarction (HCC) 09/06/2023   Nocturia 04/18/2012   Peptic ulcer 04/23/2012    Tobacco Use: Social History   Tobacco Use  Smoking Status Former   Current packs/day: 0.00   Average packs/day: 0.5 packs/day for 4.0 years (2.0 ttl pk-yrs)   Types: Cigarettes   Start date: 62   Quit date: 1984   Years since quitting: 41.9  Smokeless Tobacco Former   Types: Chew   Quit date: 1990    Labs: Review Flowsheet  More data exists      Latest Ref Rng & Units 12/13/2023 04/03/2024 04/04/2024 04/08/2024 04/10/2024  Labs for ITP Cardiac and Pulmonary Rehab  Cholestrol 0 - 200 mg/dL 73  - 56  - -  LDL (calc) 0 - 99 mg/dL 23  - 4  - -  HDL-C >59 mg/dL 28  - 24  - -  Trlycerides <150 mg/dL 876  - 860  - -  Hemoglobin A1c 4.8 - 5.6 % - 9.2  - 9.1  -  PH, Arterial 7.35 - 7.45 - - - - 7.305  7.305  7.279  7.369  7.364  7.386  7.325   PCO2 arterial 32 - 48 mmHg - - - - 46.0  42.6  51.0  35.6  36.9  38.0  38.8   Bicarbonate 20.0 - 28.0 mmol/L - - - - 22.6  20.9  23.9  20.6  21.0  22.8  21.4  20.2   TCO2 22 - 32 mmol/L - 20  - - 24  22  25  22  21  22  24  27  26  23  21  21  24    Acid-base deficit 0.0 - 2.0 mmol/L - - - - 3.0  5.0  3.0  4.0  4.0  2.0  5.0  5.0   O2 Saturation % - - - - 98  99  98  99  95  100  68  100     Details       Multiple values from one day are sorted in reverse-chronological order          Exercise Target Goals: Exercise Program Goal: Individual exercise prescription set using results from initial 6 min walk test and THRR while considering  patient's activity barriers and safety.   Exercise Prescription Goal: Initial exercise prescription builds to 30-45 minutes a day of aerobic activity, 2-3 days per week.  Home exercise guidelines will be  given to patient during program as part of exercise prescription that the participant will acknowledge.   Education: Aerobic Exercise: -  Group verbal and visual presentation on the components of exercise prescription. Introduces F.I.T.T principle from ACSM for exercise prescriptions.  Reviews F.I.T.T. principles of aerobic exercise including progression. Written material provided at class time.   Education: Resistance Exercise: - Group verbal and visual presentation on the components of exercise prescription. Introduces F.I.T.T principle from ACSM for exercise prescriptions  Reviews F.I.T.T. principles of resistance exercise including progression. Written material provided at class time.    Education: Exercise & Equipment Safety: - Individual verbal instruction and demonstration of equipment use and safety with use of the equipment.   Education: Exercise Physiology & General Exercise Guidelines: - Group verbal and written instruction with models to review the exercise physiology of the cardiovascular system and associated critical values. Provides general exercise guidelines with specific guidelines to those with heart or lung disease. Written material provided at class time.   Education: Flexibility, Balance, Mind/Body Relaxation: - Group verbal and visual presentation with interactive activity on the components of exercise prescription. Introduces F.I.T.T principle from ACSM for exercise prescriptions. Reviews F.I.T.T. principles of flexibility and balance exercise training including progression. Also discusses the mind body connection.  Reviews various relaxation techniques to help reduce and manage stress (i.e. Deep breathing, progressive muscle relaxation, and visualization). Balance handout provided to take home. Written material provided at class time.   Activity Barriers & Risk Stratification:  Activity Barriers & Cardiac Risk Stratification - 05/30/24 1157       Activity Barriers  & Cardiac Risk Stratification   Activity Barriers Arthritis;Joint Problems;Back Problems;Neck/Spine Problems;Balance Concerns;Incisional Pain;Assistive Device    Cardiac Risk Stratification High   <5 METs on         6 Minute Walk:  6 Minute Walk     Row Name 05/30/24 1522         6 Minute Walk   Phase Initial     Distance 1025 feet     Walk Time 6 minutes     # of Rest Breaks 0     MPH 1.94     METS 2.3     RPE 11     Perceived Dyspnea  0     VO2 Peak 8.04     Symptoms Yes (comment)     Comments 4/10 back pain- chronic     Resting HR 81 bpm     Resting BP 108/79     Resting Oxygen Saturation  99 %     Exercise Oxygen Saturation  during 6 min walk 100 %     Max Ex. HR 83 bpm     Max Ex. BP 162/80     2 Minute Post BP 124/79        Oxygen Initial Assessment:   Oxygen Re-Evaluation:   Oxygen Discharge (Final Oxygen Re-Evaluation):   Initial Exercise Prescription:  Initial Exercise Prescription - 05/30/24 1200       Date of Initial Exercise RX and Referring Provider   Date 05/30/24    Referring Provider Jerel Balding, MD    Expected Discharge Date 08/23/24      NuStep   Level 1    SPM 80    Minutes 30    METs 2      Prescription Details   Frequency (times per week) 3    Duration Progress to 30 minutes of continuous aerobic without signs/symptoms of physical distress      Intensity   THRR 40-80% of Max Heartrate 64-127    Ratings of Perceived Exertion 11-13  Perceived Dyspnea 0-4      Progression   Progression Continue progressive overload as per policy without signs/symptoms or physical distress.      Resistance Training   Training Prescription Yes    Weight 3    Reps 10-15          Perform Capillary Blood Glucose checks as needed.  Exercise Prescription Changes:   Exercise Prescription Changes     Row Name 06/05/24 1400 06/19/24 1500 07/03/24 1500 07/04/24 0900 07/12/24 1400     Response to Exercise   Blood Pressure  (Admit) 112/70 120/70 124/80 -- 126/75   Blood Pressure (Exercise) 132/78 118/78 -- -- --   Blood Pressure (Exit) 136/68 108/60 112/72 -- 124/70   Heart Rate (Admit) -- 87 bpm 81 bpm -- 74 bpm   Heart Rate (Exercise) 106 bpm 98 bpm 11 bpm -- 106 bpm   Heart Rate (Exit) 81 bpm 86 bpm 81 bpm -- 83 bpm   Rating of Perceived Exertion (Exercise) 13 11 11  -- 10   Symptoms None None None -- None   Comments Pt's first day in the CRP2 program Reviewed METs Reviewed METs and goals -- Reviewed METs   Duration Continue with 30 min of aerobic exercise without signs/symptoms of physical distress. Continue with 30 min of aerobic exercise without signs/symptoms of physical distress. Continue with 30 min of aerobic exercise without signs/symptoms of physical distress. -- Continue with 30 min of aerobic exercise without signs/symptoms of physical distress.   Intensity THRR unchanged THRR unchanged THRR unchanged -- THRR unchanged     Progression   Progression Continue to progress workloads to maintain intensity without signs/symptoms of physical distress. Continue to progress workloads to maintain intensity without signs/symptoms of physical distress. Continue to progress workloads to maintain intensity without signs/symptoms of physical distress. -- Continue to progress workloads to maintain intensity without signs/symptoms of physical distress.   Average METs 1.6 2.1 3 -- 3     Resistance Training   Training Prescription No No No -- Yes   Weight No wts on wednesdays No wts on wednesdays No wts on wednesdays -- 5 lbs   Reps -- -- -- -- 10-15   Time -- -- -- -- 5 Minutes     Interval Training   Interval Training No No No -- No     NuStep   Level 1 3 4  -- 4   SPM 66 82 93 -- 97   Minutes 30 30 30  -- 30   METs 1.6 2.1 3 -- 3    Row Name 07/24/24 1519 07/31/24 1600           Response to Exercise   Blood Pressure (Admit) 120/50 118/78      Blood Pressure (Exit) -- 110/60      Heart Rate (Admit) 89  bpm 89 bpm      Heart Rate (Exercise) 118 bpm 111 bpm      Heart Rate (Exit) 96 bpm 88 bpm      Rating of Perceived Exertion (Exercise) 12 11      Symptoms None None      Comments Reviewed Home exercise Rx Reviewed METs and goals      Duration Continue with 30 min of aerobic exercise without signs/symptoms of physical distress. Continue with 30 min of aerobic exercise without signs/symptoms of physical distress.      Intensity THRR unchanged THRR unchanged        Progression   Progression Continue to progress  workloads to maintain intensity without signs/symptoms of physical distress. Continue to progress workloads to maintain intensity without signs/symptoms of physical distress.      Average METs 3.2 3.4        Resistance Training   Training Prescription No No      Weight No wts on wednesdays No wts on wednesdays        Interval Training   Interval Training No No        NuStep   Level 5 5      SPM 87 90      Minutes 30 30      METs 3.2 3.4        Home Exercise Plan   Plans to continue exercise at Home (comment) Home (comment)      Frequency Add 2 additional days to program exercise sessions. Add 2 additional days to program exercise sessions.      Initial Home Exercises Provided 07/24/24 07/24/24         Exercise Comments:   Exercise Comments     Row Name 06/05/24 1419 06/19/24 1500 07/03/24 1500 07/12/24 1422 07/24/24 1500   Exercise Comments Pt's first day in the CRP2 program. Pt exercised without complaints. Reviewed METs. Pt is making progress on METs and has increased his exercise workloads. Reviewed METs and goals. Pt has been able to increase workloads and METs. Progressing well. Reviewed METs. At a plateau. encouraged to increase SPM or level . Reviewed home exericse Rx, Pt has been walking at home with his wife and will continue to do this. Reviewed goal of 150 minutes of exercise per week. Pt verbalized understanding of the home exercise rx and was provided a copy.     Row Name 07/31/24 1715           Exercise Comments Reviewed METs and goals. Pt has made progress.          Exercise Goals and Review:   Exercise Goals     Row Name 05/30/24 1158             Exercise Goals   Increase Physical Activity Yes       Intervention Provide advice, education, support and counseling about physical activity/exercise needs.;Develop an individualized exercise prescription for aerobic and resistive training based on initial evaluation findings, risk stratification, comorbidities and participant's personal goals.       Expected Outcomes Short Term: Attend rehab on a regular basis to increase amount of physical activity.;Long Term: Exercising regularly at least 3-5 days a week.;Long Term: Add in home exercise to make exercise part of routine and to increase amount of physical activity.       Increase Strength and Stamina Yes       Intervention Develop an individualized exercise prescription for aerobic and resistive training based on initial evaluation findings, risk stratification, comorbidities and participant's personal goals.;Provide advice, education, support and counseling about physical activity/exercise needs.       Expected Outcomes Long Term: Improve cardiorespiratory fitness, muscular endurance and strength as measured by increased METs and functional capacity ( );Short Term: Perform resistance training exercises routinely during rehab and add in resistance training at home;Short Term: Increase workloads from initial exercise prescription for resistance, speed, and METs.       Able to understand and use rate of perceived exertion (RPE) scale Yes       Intervention Provide education and explanation on how to use RPE scale       Expected Outcomes Short Term: Able  to use RPE daily in rehab to express subjective intensity level;Long Term:  Able to use RPE to guide intensity level when exercising independently       Knowledge and understanding of Target  Heart Rate Range (THRR) Yes       Intervention Provide education and explanation of THRR including how the numbers were predicted and where they are located for reference       Expected Outcomes Long Term: Able to use THRR to govern intensity when exercising independently;Short Term: Able to state/look up THRR;Short Term: Able to use daily as guideline for intensity in rehab       Understanding of Exercise Prescription Yes       Intervention Provide education, explanation, and written materials on patient's individual exercise prescription       Expected Outcomes Short Term: Able to explain program exercise prescription;Long Term: Able to explain home exercise prescription to exercise independently          Exercise Goals Re-Evaluation :  Exercise Goals Re-Evaluation     Row Name 06/05/24 1416 07/03/24 1500 07/31/24 1713         Exercise Goal Re-Evaluation   Exercise Goals Review Increase Physical Activity;Understanding of Exercise Prescription;Increase Strength and Stamina;Knowledge and understanding of Target Heart Rate Range (THRR);Able to understand and use rate of perceived exertion (RPE) scale Increase Physical Activity;Understanding of Exercise Prescription;Increase Strength and Stamina;Knowledge and understanding of Target Heart Rate Range (THRR);Able to understand and use rate of perceived exertion (RPE) scale Increase Physical Activity;Understanding of Exercise Prescription;Increase Strength and Stamina;Knowledge and understanding of Target Heart Rate Range (THRR);Able to understand and use rate of perceived exertion (RPE) scale     Comments Pt's first day in the CRP2 program. Pt understands the exercise Rx, RPE scale and THRR. Reviewed MET and goals. Pt voices progress on his goals of imrpoved strength and stamina; so much so that pt voices he has been able to go deer hunting. Pt has goal of weight loss and has losr 2.2 kg to date. Pt also has goal to imrpove his HgA1c. that has not been  checked but pt reports that his blood sugars are more controlled. Reviewed MET and goals. Pt continue to voices progress on his goals of improved strength and stamina. Pt has goal of weight loss and has lost 2.5 kg to date. Pt wantsto try adding the treadmill, so we will do that next session.     Expected Outcomes Will continue to monitor patient and progress workloads as toelrated. Will continue to monitor patient and progress workloads as toelrated. Will continue to monitor patient and progress workloads as toelrated.        Discharge Exercise Prescription (Final Exercise Prescription Changes):  Exercise Prescription Changes - 07/31/24 1600       Response to Exercise   Blood Pressure (Admit) 118/78    Blood Pressure (Exit) 110/60    Heart Rate (Admit) 89 bpm    Heart Rate (Exercise) 111 bpm    Heart Rate (Exit) 88 bpm    Rating of Perceived Exertion (Exercise) 11    Symptoms None    Comments Reviewed METs and goals    Duration Continue with 30 min of aerobic exercise without signs/symptoms of physical distress.    Intensity THRR unchanged      Progression   Progression Continue to progress workloads to maintain intensity without signs/symptoms of physical distress.    Average METs 3.4      Resistance Training   Training Prescription  No    Weight No wts on wednesdays      Interval Training   Interval Training No      NuStep   Level 5    SPM 90    Minutes 30    METs 3.4      Home Exercise Plan   Plans to continue exercise at Home (comment)    Frequency Add 2 additional days to program exercise sessions.    Initial Home Exercises Provided 07/24/24          Nutrition:  Target Goals: Understanding of nutrition guidelines, daily intake of sodium 1500mg , cholesterol 200mg , calories 30% from fat and 7% or less from saturated fats, daily to have 5 or more servings of fruits and vegetables.  Education: Nutrition 1 -Group instruction provided by verbal, written material,  interactive activities, discussions, models, and posters to present general guidelines for heart healthy nutrition including macronutrients, label reading, and promoting whole foods over processed counterparts. Education serves as pensions consultant of discussion of heart healthy eating for all. Written material provided at class time.    Education: Nutrition 2 -Group instruction provided by verbal, written material, interactive activities, discussions, models, and posters to present general guidelines for heart healthy nutrition including sodium, cholesterol, and saturated fat. Providing guidance of habit forming to improve blood pressure, cholesterol, and body weight. Written material provided at class time.     Biometrics:  Pre Biometrics - 05/30/24 1157       Pre Biometrics   Waist Circumference 51 inches    Hip Circumference 44 inches    Waist to Hip Ratio 1.16 %    Triceps Skinfold 30 mm    % Body Fat 39.9 %    Grip Strength 14 kg    Flexibility 0 in   not done- back issues   Single Leg Stand 1.5 seconds           Nutrition Therapy Plan and Nutrition Goals:   Nutrition Assessments:  MEDIFICTS Score Key: >=70 Need to make dietary changes  40-70 Heart Healthy Diet <= 40 Therapeutic Level Cholesterol Diet  Flowsheet Row INTENSIVE CARDIAC REHAB from 06/05/2024 in [facility]  Picture Your Plate Total Score on Admission 66   Picture Your Plate Scores: <59 Unhealthy dietary pattern with much room for improvement. 41-50 Dietary pattern unlikely to meet recommendations for good health and room for improvement. 51-60 More healthful dietary pattern, with some room for improvement.  >60 Healthy dietary pattern, although there may be some specific behaviors that could be improved.    Nutrition Goals Re-Evaluation:   Nutrition Goals Discharge (Final Nutrition Goals Re-Evaluation):   Psychosocial: Target Goals:  Acknowledge presence or absence of significant depression and/or stress, maximize coping skills, provide positive support system. Participant is able to verbalize types and ability to use techniques and skills needed for reducing stress and depression.   Education: Stress, Anxiety, and Depression - Group verbal and visual presentation to define topics covered.  Reviews how body is impacted by stress, anxiety, and depression.  Also discusses healthy ways to reduce stress and to treat/manage anxiety and depression. Written material provided at class time.   Education: Sleep Hygiene -Provides group verbal and written instruction about how sleep can affect your health.  Define sleep hygiene, discuss sleep cycles and impact of sleep habits. Review good sleep hygiene tips.   Initial Review & Psychosocial Screening:  Initial Psych Review & Screening - 05/30/24 1524  Initial Review   Current issues with Current Stress Concerns    Source of Stress Concerns None Identified    Comments Oneil shared that he has low levels of stress relating to general life things. Nothing specific. Denies any feelings of depression/anxiety or need for additional resources at this time.      Family Dynamics   Good Support System? Yes   wife     Barriers   Psychosocial barriers to participate in program The patient should benefit from training in stress management and relaxation.      Screening Interventions   Interventions To provide support and resources with identified psychosocial needs;Provide feedback about the scores to participant;Encouraged to exercise    Expected Outcomes Short Term goal: Identification and review with participant of any Quality of Life or Depression concerns found by scoring the questionnaire.;Long Term Goal: Stressors or current issues are controlled or eliminated.;Long Term goal: The participant improves quality of Life and PHQ9 Scores as seen by post scores and/or verbalization of  changes          Quality of Life Scores:   Quality of Life - 05/30/24 1525       Quality of Life   Select Quality of Life      Quality of Life Scores   Health/Function Pre 22.9 %    Socioeconomic Pre 27.38 %    Psych/Spiritual Pre 22.64 %    Family Pre 25.2 %    GLOBAL Pre 24.2 %         Scores of 19 and below usually indicate a poorer quality of life in these areas.  A difference of  2-3 points is a clinically meaningful difference.  A difference of 2-3 points in the total score of the Quality of Life Index has been associated with significant improvement in overall quality of life, self-image, physical symptoms, and general health in studies assessing change in quality of life.  PHQ-9: Review Flowsheet       07/03/2024 05/30/2024  Depression screen PHQ 2/9  Decreased Interest 0 2  Down, Depressed, Hopeless 0 0  PHQ - 2 Score 0 2  Altered sleeping 3 3  Tired, decreased energy 0 0  Change in appetite 0 0  Feeling bad or failure about yourself  0 0  Trouble concentrating 0 0  Moving slowly or fidgety/restless 0 0  Suicidal thoughts 0 0  PHQ-9 Score 3  5   Difficult doing work/chores Not difficult at all Not difficult at all    Details       Data saved with a previous flowsheet row definition        Interpretation of Total Score  Total Score Depression Severity:  1-4 = Minimal depression, 5-9 = Mild depression, 10-14 = Moderate depression, 15-19 = Moderately severe depression, 20-27 = Severe depression   Psychosocial Evaluation and Intervention:   Psychosocial Re-Evaluation:  Psychosocial Re-Evaluation     Row Name 07/05/24 1109 08/06/24 1011           Psychosocial Re-Evaluation   Current issues with Current Stress Concerns Current Stress Concerns      Comments Mark's PHQ-9 has decreased from a 5 at orientation to a 3 on 07/03/24.  He has not voiced any additional stressors/concerns during exercise at cardiac rehab. Oneil has not voiced any additional  psychosocial stressors/concerns during exercise at cardiac rehab.      Expected Outcomes Oneil will report controlled/decreased psychosocial concerns/stressors by the end of cardiac rehab. Oneil will report  controlled/decreased psychosocial concerns/stressors by the end of cardiac rehab.      Interventions -- Stress management education;Encouraged to attend Cardiac Rehabilitation for the exercise;Relaxation education      Continue Psychosocial Services  No Follow up required No Follow up required         Psychosocial Discharge (Final Psychosocial Re-Evaluation):  Psychosocial Re-Evaluation - 08/06/24 1011       Psychosocial Re-Evaluation   Current issues with Current Stress Concerns    Comments Oneil has not voiced any additional psychosocial stressors/concerns during exercise at cardiac rehab.    Expected Outcomes Oneil will report controlled/decreased psychosocial concerns/stressors by the end of cardiac rehab.    Interventions Stress management education;Encouraged to attend Cardiac Rehabilitation for the exercise;Relaxation education    Continue Psychosocial Services  No Follow up required          Vocational Rehabilitation: Provide vocational rehab assistance to qualifying candidates.   Vocational Rehab Evaluation & Intervention:  Vocational Rehab - 05/30/24 1158       Initial Vocational Rehab Evaluation & Intervention   Assessment shows need for Vocational Rehabilitation No   retired/disability         Education: Education Goals: Education classes will be provided on a variety of topics geared toward better understanding of heart health and risk factor modification. Participant will state understanding/return demonstration of topics presented as noted by education test scores.  Learning Barriers/Preferences:  Learning Barriers/Preferences - 05/30/24 1200       Learning Barriers/Preferences   Learning Preferences Audio;Computer/Internet;Written Material;Video;Verbal  Instruction;Skilled Demonstration;Pictoral;Individual Instruction;Group Instruction          General Cardiac Education Topics:  AED/CPR: - Group verbal and written instruction with the use of models to demonstrate the basic use of the AED with the basic ABC's of resuscitation.   Test and Procedures: - Group verbal and visual presentation and models provide information about basic cardiac anatomy and function. Reviews the testing methods done to diagnose heart disease and the outcomes of the test results. Describes the treatment choices: Medical Management, Angioplasty, or Coronary Bypass Surgery for treating various heart conditions including Myocardial Infarction, Angina, Valve Disease, and Cardiac Arrhythmias. Written material provided at class time.   Medication Safety: - Group verbal and visual instruction to review commonly prescribed medications for heart and lung disease. Reviews the medication, class of the drug, and side effects. Includes the steps to properly store meds and maintain the prescription regimen. Written material provided at class time.   Intimacy: - Group verbal instruction through game format to discuss how heart and lung disease can affect sexual intimacy. Written material provided at class time.   Know Your Numbers and Heart Failure: - Group verbal and visual instruction to discuss disease risk factors for cardiac and pulmonary disease and treatment options.  Reviews associated critical values for Overweight/Obesity, Hypertension, Cholesterol, and Diabetes.  Discusses basics of heart failure: signs/symptoms and treatments.  Introduces Heart Failure Zone chart for action plan for heart failure. Written material provided at class time.   Infection Prevention: - Provides verbal and written material to individual with discussion of infection control including proper hand washing and proper equipment cleaning during exercise session.   Falls Prevention: -  Provides verbal and written material to individual with discussion of falls prevention and safety.   Other: -Provides group and verbal instruction on various topics (see comments)   Knowledge Questionnaire Score:  Knowledge Questionnaire Score - 05/30/24 1526       Knowledge Questionnaire Score  Pre Score 22/24          Core Components/Risk Factors/Patient Goals at Admission:  Personal Goals and Risk Factors at Admission - 05/30/24 1159       Core Components/Risk Factors/Patient Goals on Admission    Weight Management Yes;Obesity;Weight Loss    Intervention Weight Management: Develop a combined nutrition and exercise program designed to reach desired caloric intake, while maintaining appropriate intake of nutrient and fiber, sodium and fats, and appropriate energy expenditure required for the weight goal.;Weight Management: Provide education and appropriate resources to help participant work on and attain dietary goals.;Weight Management/Obesity: Establish reasonable short term and long term weight goals.;Obesity: Provide education and appropriate resources to help participant work on and attain dietary goals.    Admit Weight 239 lb 3.2 oz (108.5 kg)    Goal Weight: Long Term 200 lb (90.7 kg)   pt goal   Expected Outcomes Short Term: Continue to assess and modify interventions until short term weight is achieved;Long Term: Adherence to nutrition and physical activity/exercise program aimed toward attainment of established weight goal;Weight Loss: Understanding of general recommendations for a balanced deficit meal plan, which promotes 1-2 lb weight loss per week and includes a negative energy balance of [phone] kcal/d;Understanding recommendations for meals to include 15-35% energy as protein, 25-35% energy from fat, 35-60% energy from carbohydrates, less than 200mg  of dietary cholesterol, 20-35 gm of total fiber daily;Understanding of distribution of calorie intake throughout the day  with the consumption of 4-5 meals/snacks    Diabetes Yes    Intervention Provide education about signs/symptoms and action to take for hypo/hyperglycemia.;Provide education about proper nutrition, including hydration, and aerobic/resistive exercise prescription along with prescribed medications to achieve blood glucose in normal ranges: Fasting glucose 65-99 mg/dL    Expected Outcomes Short Term: Participant verbalizes understanding of the signs/symptoms and immediate care of hyper/hypoglycemia, proper foot care and importance of medication, aerobic/resistive exercise and nutrition plan for blood glucose control.;Long Term: Attainment of HbA1C < 7%.    Hypertension Yes    Intervention Provide education on lifestyle modifcations including regular physical activity/exercise, weight management, moderate sodium restriction and increased consumption of fresh fruit, vegetables, and low fat dairy, alcohol moderation, and smoking cessation.;Monitor prescription use compliance.    Expected Outcomes Short Term: Continued assessment and intervention until BP is < 140/77mm HG in hypertensive participants. < 130/21mm HG in hypertensive participants with diabetes, heart failure or chronic kidney disease.;Long Term: Maintenance of blood pressure at goal levels.    Lipids Yes    Intervention Provide education and support for participant on nutrition & aerobic/resistive exercise along with prescribed medications to achieve LDL 70mg , HDL >40mg .    Expected Outcomes Short Term: Participant states understanding of desired cholesterol values and is compliant with medications prescribed. Participant is following exercise prescription and nutrition guidelines.;Long Term: Cholesterol controlled with medications as prescribed, with individualized exercise RX and with personalized nutrition plan. Value goals: LDL < 70mg , HDL > 40 mg.    Stress Yes    Intervention Offer individual and/or small group education and counseling on  adjustment to heart disease, stress management and health-related lifestyle change. Teach and support self-help strategies.;Refer participants experiencing significant psychosocial distress to appropriate mental health specialists for further evaluation and treatment. When possible, include family members and significant others in education/counseling sessions.    Expected Outcomes Short Term: Participant demonstrates changes in health-related behavior, relaxation and other stress management skills, ability to obtain effective social support, and compliance with psychotropic medications if prescribed.;Long  Term: Emotional wellbeing is indicated by absence of clinically significant psychosocial distress or social isolation.          Education:Diabetes - Individual verbal and written instruction to review signs/symptoms of diabetes, desired ranges of glucose level fasting, after meals and with exercise. Acknowledge that pre and post exercise glucose checks will be done for 3 sessions at entry of program.   Core Components/Risk Factors/Patient Goals Review:   Goals and Risk Factor Review     Row Name 07/05/24 1104 08/06/24 1013           Core Components/Risk Factors/Patient Goals Review   Personal Goals Review Diabetes;Hypertension;Lipids;Stress;Weight Management/Obesity Diabetes;Hypertension;Lipids;Stress;Weight Management/Obesity      Review Pt doing well with exercise at cardiac rehab. Vital signs have been stable.Oneil has increased his METs over the last 30 days and lost 2.8kg since start of CR (06/05/24). Pt doing well with exercise at cardiac rehab. Vital signs have been stable. Oneil has increased his METs over the last 30 days.      Expected Outcomes Pt will continue to participate in cardiac rehab for exercise, nutrition, and lifestyle modifications Pt will continue to participate in cardiac rehab for exercise, nutrition, and lifestyle modifications         Core Components/Risk  Factors/Patient Goals at Discharge (Final Review):   Goals and Risk Factor Review - 08/06/24 1013       Core Components/Risk Factors/Patient Goals Review   Personal Goals Review Diabetes;Hypertension;Lipids;Stress;Weight Management/Obesity    Review Pt doing well with exercise at cardiac rehab. Vital signs have been stable. Oneil has increased his METs over the last 30 days.    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 Introduction to [facility] education/itensive cardiac rehab. Initial orientation packet reviewed with patient. 30 Day ITP Review.  Oneil started cardiac rehab on 06/05/24 and tolerated exercise well. 30 Day ITP Review.  Oneil has good attendance and participation with exercise in cardiac rehab. 30 Day ITP Review.  Oneil continues to have good attendance and participation with exercise in cardiac rehab.       Comments: see ITP comments

## 2024-08-07 ENCOUNTER — Encounter (HOSPITAL_COMMUNITY)
Admission: RE | Admit: 2024-08-07 | Discharge: 2024-08-07 | Disposition: A | Source: Ambulatory Visit | Attending: Cardiovascular Disease

## 2024-08-07 DIAGNOSIS — Z951 Presence of aortocoronary bypass graft: Secondary | ICD-10-CM

## 2024-08-09 ENCOUNTER — Other Ambulatory Visit (HOSPITAL_BASED_OUTPATIENT_CLINIC_OR_DEPARTMENT_OTHER): Payer: Self-pay

## 2024-08-09 ENCOUNTER — Encounter (HOSPITAL_COMMUNITY)
Admission: RE | Admit: 2024-08-09 | Discharge: 2024-08-09 | Disposition: A | Source: Ambulatory Visit | Attending: Cardiovascular Disease

## 2024-08-09 DIAGNOSIS — Z951 Presence of aortocoronary bypass graft: Secondary | ICD-10-CM

## 2024-08-12 ENCOUNTER — Encounter (HOSPITAL_COMMUNITY): Admission: RE | Admit: 2024-08-12 | Source: Ambulatory Visit

## 2024-08-12 ENCOUNTER — Telehealth (HOSPITAL_COMMUNITY): Payer: Self-pay

## 2024-08-12 NOTE — Telephone Encounter (Signed)
 Patient c/o for 12:30 CR class, states he fell and needs stitches.

## 2024-08-14 ENCOUNTER — Encounter (HOSPITAL_COMMUNITY)
Admission: RE | Admit: 2024-08-14 | Discharge: 2024-08-14 | Disposition: A | Discharge status: Home / Self Care | Source: Ambulatory Visit | Attending: Cardiovascular Disease | Admitting: Cardiovascular Disease

## 2024-08-14 DIAGNOSIS — Z951 Presence of aortocoronary bypass graft: Secondary | ICD-10-CM

## 2024-08-16 ENCOUNTER — Encounter (HOSPITAL_COMMUNITY)
Admission: RE | Admit: 2024-08-16 | Discharge: 2024-08-16 | Disposition: A | Source: Ambulatory Visit | Attending: Cardiovascular Disease

## 2024-08-16 VITALS — Ht 66.0 in | Wt 235.9 lb

## 2024-08-16 DIAGNOSIS — Z951 Presence of aortocoronary bypass graft: Secondary | ICD-10-CM

## 2024-08-19 ENCOUNTER — Telehealth (HOSPITAL_COMMUNITY): Payer: Self-pay

## 2024-08-19 ENCOUNTER — Encounter (HOSPITAL_COMMUNITY)

## 2024-08-19 NOTE — Telephone Encounter (Signed)
 Patient c/o for 12:30 CR class, states he has no heat and is waiting on a technician.

## 2024-08-21 ENCOUNTER — Encounter (HOSPITAL_COMMUNITY): Admission: RE | Admit: 2024-08-21 | Discharge: 2024-08-21 | Attending: Cardiovascular Disease

## 2024-08-21 DIAGNOSIS — Z951 Presence of aortocoronary bypass graft: Secondary | ICD-10-CM

## 2024-08-23 ENCOUNTER — Encounter (HOSPITAL_COMMUNITY)

## 2024-08-23 DIAGNOSIS — Z951 Presence of aortocoronary bypass graft: Secondary | ICD-10-CM

## 2024-08-23 NOTE — Progress Notes (Signed)
 Discharge Progress Report  Patient Details  Name: Devon Wood MRN: [id number] Date of Birth: 13-Oct-1960 Referring Provider:   Flowsheet Row INTENSIVE CARDIAC REHAB ORIENT from 05/30/2024 in [facility]  Referring Provider Jerel Balding, MD     Number of Visits: 60  Reason for Discharge:  Patient reached a stable level of exercise. Patient independent in their exercise. Patient has met program and personal goals.  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  ADL UCSD:   Initial Exercise Prescription:  Initial Exercise Prescription - 05/30/24 1200       Date of Initial Exercise RX and Referring Provider   Date 05/30/24    Referring Provider Jerel Balding, MD    Expected Discharge Date 08/23/24      NuStep   Level 1    SPM 80    Minutes 30    METs 2      Prescription Details   Frequency (times per week) 3    Duration Progress to 30 minutes of continuous aerobic without signs/symptoms of physical distress      Intensity   THRR 40-80% of Max Heartrate 64-127    Ratings of Perceived Exertion 11-13    Perceived Dyspnea 0-4      Progression   Progression Continue progressive overload as per policy without signs/symptoms or physical distress.      Resistance Training   Training Prescription Yes    Weight 3    Reps 10-15          Discharge Exercise Prescription (Final Exercise Prescription Changes):  Exercise Prescription Changes - 08/23/24 1500       Response to Exercise   Blood Pressure (Admit) 130/70    Blood Pressure (Exit) 124/70    Heart Rate (Admit) 81 bpm    Heart Rate (Exercise) 107 bpm    Heart Rate (Exit) 88 bpm    Rating of Perceived Exertion (Exercise) 11    Symptoms None    Comments Pt graduated from the CRP2 program    Duration Continue with 30 min of aerobic exercise without signs/symptoms of physical distress.    Intensity THRR unchanged       Progression   Progression Continue to progress workloads to maintain intensity without signs/symptoms of physical distress.    Average METs 2.7      Resistance Training   Weight 8lbs    Reps 10-15    Time 5 Minutes      Interval Training   Interval Training No      Treadmill   MPH 1.6    Grade 0    Minutes 15    METs 2.23      NuStep   Level 5    SPM 81    Minutes 15    METs 2.9      Home Exercise Plan   Plans to continue exercise at Home (comment)    Frequency Add 2 additional days to program exercise sessions.    Initial Home Exercises Provided 07/24/24          Functional Capacity:  6 Minute Walk     Row Name 05/30/24 1522 08/16/24 1652       6 Minute Walk   Phase Initial Discharge    Distance 1025 feet 1635 feet    Distance % Change -- 59.51 %    Distance Feet Change -- 610 ft    Walk Time 6  minutes 6 minutes    # of Rest Breaks 0 0    MPH 1.94 3.1    METS 2.3 3.34    RPE 11 13    Perceived Dyspnea  0 0    VO2 Peak 8.04 11.7    Symptoms Yes (comment) Yes (comment)    Comments 4/10 back pain- chronic 7/10 back pain- chronic    Resting HR 81 bpm 81 bpm    Resting BP 108/79 106/66    Resting Oxygen Saturation  99 % --    Exercise Oxygen Saturation  during 6 min walk 100 % --    Max Ex. HR 83 bpm 106 bpm    Max Ex. BP 162/80 148/84    2 Minute Post BP 124/79 128/78       Psychological, QOL, Others - Outcomes: PHQ 2/9:    08/23/2024    1:26 PM 07/03/2024   12:52 PM 05/30/2024    3:22 PM  Depression screen PHQ 2/9  Decreased Interest 0 0 2  Down, Depressed, Hopeless 0 0 0  PHQ - 2 Score 0 0 2  Altered sleeping  3 3  Tired, decreased energy 0 0 0  Change in appetite 0 0 0  Feeling bad or failure about yourself  0 0 0  Trouble concentrating 0 0 0  Moving slowly or fidgety/restless 0 0 0  Suicidal thoughts 0 0 0  PHQ-9 Score  3  5   Difficult doing work/chores Not difficult at all Not difficult at all Not difficult at all     Data saved  with a previous flowsheet row definition    Quality of Life:  Quality of Life - 08/22/24 0832       Quality of Life   Select Quality of Life      Quality of Life Scores   Health/Function Pre 22.9 %    Health/Function Post 27.47 %    Health/Function % Change 19.96 %    Socioeconomic Pre 27.38 %    Socioeconomic Post 27.19 %    Socioeconomic % Change  -0.69 %    Psych/Spiritual Pre 22.64 %    Psych/Spiritual Post 29.29 %    Psych/Spiritual % Change 29.37 %    Family Pre 25.2 %    Family Post 27.6 %    Family % Change 9.52 %    GLOBAL Pre 24.2 %    GLOBAL Post 27.79 %    GLOBAL % Change 14.83 %          Personal Goals: Goals established at orientation with interventions provided to work toward goal.  Personal Goals and Risk Factors at Admission - 05/30/24 1159       Core Components/Risk Factors/Patient Goals on Admission    Weight Management Yes;Obesity;Weight Loss    Intervention Weight Management: Develop a combined nutrition and exercise program designed to reach desired caloric intake, while maintaining appropriate intake of nutrient and fiber, sodium and fats, and appropriate energy expenditure required for the weight goal.;Weight Management: Provide education and appropriate resources to help participant work on and attain dietary goals.;Weight Management/Obesity: Establish reasonable short term and long term weight goals.;Obesity: Provide education and appropriate resources to help participant work on and attain dietary goals.    Admit Weight 239 lb 3.2 oz (108.5 kg)    Goal Weight: Long Term 200 lb (90.7 kg)   pt goal   Expected Outcomes Short Term: Continue to assess and modify interventions until short term weight is achieved;Long  Term: Adherence to nutrition and physical activity/exercise program aimed toward attainment of established weight goal;Weight Loss: Understanding of general recommendations for a balanced deficit meal plan, which promotes 1-2 lb weight loss  per week and includes a negative energy balance of [phone] kcal/d;Understanding recommendations for meals to include 15-35% energy as protein, 25-35% energy from fat, 35-60% energy from carbohydrates, less than 200mg  of dietary cholesterol, 20-35 gm of total fiber daily;Understanding of distribution of calorie intake throughout the day with the consumption of 4-5 meals/snacks    Diabetes Yes    Intervention Provide education about signs/symptoms and action to take for hypo/hyperglycemia.;Provide education about proper nutrition, including hydration, and aerobic/resistive exercise prescription along with prescribed medications to achieve blood glucose in normal ranges: Fasting glucose 65-99 mg/dL    Expected Outcomes Short Term: Participant verbalizes understanding of the signs/symptoms and immediate care of hyper/hypoglycemia, proper foot care and importance of medication, aerobic/resistive exercise and nutrition plan for blood glucose control.;Long Term: Attainment of HbA1C < 7%.    Hypertension Yes    Intervention Provide education on lifestyle modifcations including regular physical activity/exercise, weight management, moderate sodium restriction and increased consumption of fresh fruit, vegetables, and low fat dairy, alcohol moderation, and smoking cessation.;Monitor prescription use compliance.    Expected Outcomes Short Term: Continued assessment and intervention until BP is < 140/5mm HG in hypertensive participants. < 130/20mm HG in hypertensive participants with diabetes, heart failure or chronic kidney disease.;Long Term: Maintenance of blood pressure at goal levels.    Lipids Yes    Intervention Provide education and support for participant on nutrition & aerobic/resistive exercise along with prescribed medications to achieve LDL 70mg , HDL >40mg .    Expected Outcomes Short Term: Participant states understanding of desired cholesterol values and is compliant with medications prescribed.  Participant is following exercise prescription and nutrition guidelines.;Long Term: Cholesterol controlled with medications as prescribed, with individualized exercise RX and with personalized nutrition plan. Value goals: LDL < 70mg , HDL > 40 mg.    Stress Yes    Intervention Offer individual and/or small group education and counseling on adjustment to heart disease, stress management and health-related lifestyle change. Teach and support self-help strategies.;Refer participants experiencing significant psychosocial distress to appropriate mental health specialists for further evaluation and treatment. When possible, include family members and significant others in education/counseling sessions.    Expected Outcomes Short Term: Participant demonstrates changes in health-related behavior, relaxation and other stress management skills, ability to obtain effective social support, and compliance with psychotropic medications if prescribed.;Long Term: Emotional wellbeing is indicated by absence of clinically significant psychosocial distress or social isolation.           Personal Goals Discharge:  Goals and Risk Factor Review     Row Name 07/05/24 1104 08/06/24 1013 08/27/24 1554         Core Components/Risk Factors/Patient Goals Review   Personal Goals Review Diabetes;Hypertension;Lipids;Stress;Weight Management/Obesity Diabetes;Hypertension;Lipids;Stress;Weight Management/Obesity Diabetes;Hypertension;Lipids;Stress;Weight Management/Obesity     Review Pt doing well with exercise at cardiac rehab. Vital signs have been stable.Devon Wood has increased his METs over the last 30 days and lost 2.8kg since start of CR (06/05/24). Pt doing well with exercise at cardiac rehab. Vital signs have been stable. Devon Wood has increased his METs over the last 30 days. Devon Wood did well with exercise at cardiac rehab. Vital signs were stable. Devon Wood completed cardiac rehab on 08/23/24.     Expected Outcomes Pt will continue to  participate in cardiac rehab for exercise, nutrition, and lifestyle modifications Pt will continue  to participate in cardiac rehab for exercise, nutrition, and lifestyle modifications Pt will continue to exercise,  follow nutrition, and lifestyle modifications upon completion of cardiac rehab        Exercise Goals and Review:  Exercise Goals     Row Name 05/30/24 1158             Exercise Goals   Increase Physical Activity Yes       Intervention Provide advice, education, support and counseling about physical activity/exercise needs.;Develop an individualized exercise prescription for aerobic and resistive training based on initial evaluation findings, risk stratification, comorbidities and participant's personal goals.       Expected Outcomes Short Term: Attend rehab on a regular basis to increase amount of physical activity.;Long Term: Exercising regularly at least 3-5 days a week.;Long Term: Add in home exercise to make exercise part of routine and to increase amount of physical activity.       Increase Strength and Stamina Yes       Intervention Develop an individualized exercise prescription for aerobic and resistive training based on initial evaluation findings, risk stratification, comorbidities and participant's personal goals.;Provide advice, education, support and counseling about physical activity/exercise needs.       Expected Outcomes Long Term: Improve cardiorespiratory fitness, muscular endurance and strength as measured by increased METs and functional capacity ( );Short Term: Perform resistance training exercises routinely during rehab and add in resistance training at home;Short Term: Increase workloads from initial exercise prescription for resistance, speed, and METs.       Able to understand and use rate of perceived exertion (RPE) scale Yes       Intervention Provide education and explanation on how to use RPE scale       Expected Outcomes Short Term: Able to use RPE  daily in rehab to express subjective intensity level;Long Term:  Able to use RPE to guide intensity level when exercising independently       Knowledge and understanding of Target Heart Rate Range (THRR) Yes       Intervention Provide education and explanation of THRR including how the numbers were predicted and where they are located for reference       Expected Outcomes Long Term: Able to use THRR to govern intensity when exercising independently;Short Term: Able to state/look up THRR;Short Term: Able to use daily as guideline for intensity in rehab       Understanding of Exercise Prescription Yes       Intervention Provide education, explanation, and written materials on patient's individual exercise prescription       Expected Outcomes Short Term: Able to explain program exercise prescription;Long Term: Able to explain home exercise prescription to exercise independently          Exercise Goals Re-Evaluation:  Exercise Goals Re-Evaluation     Row Name 06/05/24 1416 07/03/24 1500 07/31/24 1713 08/23/24 1500 08/27/24 1101     Exercise Goal Re-Evaluation   Exercise Goals Review Increase Physical Activity;Understanding of Exercise Prescription;Increase Strength and Stamina;Knowledge and understanding of Target Heart Rate Range (THRR);Able to understand and use rate of perceived exertion (RPE) scale Increase Physical Activity;Understanding of Exercise Prescription;Increase Strength and Stamina;Knowledge and understanding of Target Heart Rate Range (THRR);Able to understand and use rate of perceived exertion (RPE) scale Increase Physical Activity;Understanding of Exercise Prescription;Increase Strength and Stamina;Knowledge and understanding of Target Heart Rate Range (THRR);Able to understand and use rate of perceived exertion (RPE) scale Increase Physical Activity;Understanding of Exercise Prescription;Increase Strength and Stamina;Knowledge and understanding of  Target Heart Rate Range (THRR);Able to  understand and use rate of perceived exertion (RPE) scale --   Comments Pt's first day in the CRP2 program. Pt understands the exercise Rx, RPE scale and THRR. Reviewed MET and goals. Pt voices progress on his goals of imrpoved strength and stamina; so much so that pt voices he has been able to go deer hunting. Pt has goal of weight loss and has losr 2.2 kg to date. Pt also has goal to imrpove his HgA1c. that has not been checked but pt reports that his blood sugars are more controlled. Reviewed MET and goals. Pt continue to voices progress on his goals of improved strength and stamina. Pt has goal of weight loss and has lost 2.5 kg to date. Pt wantsto try adding the treadmill, so we will do that next session. Pt graduated from the CRP2 program today. Pt made progress on his goals of improved strength and stamina. Pt has goal of weight loss; by discharged pt had gained 1 kg back so pt had total weight loss of 1kg. Pt was able to achieve 15 minutes on the treadmill which was a goal. Pt plans to continue his exercise by walking, doing the warm-up exercises and may return to [employer]. Pt voices he will aim to do 30 minutes 3x/week. Pt was encouraged to aim for 150 minutes per week of aerobic exercise. --   Expected Outcomes Will continue to monitor patient and progress workloads as toelrated. Will continue to monitor patient and progress workloads as toelrated. Will continue to monitor patient and progress workloads as toelrated. Pt will continue his exercise at home and potentially [employer]. --      Nutrition & Weight - Outcomes:  Pre Biometrics - 05/30/24 1157       Pre Biometrics   Waist Circumference 51 inches    Hip Circumference 44 inches    Waist to Hip Ratio 1.16 %    Triceps Skinfold 30 mm    % Body Fat 39.9 %    Grip Strength 14 kg    Flexibility 0 in   not done- back issues   Single Leg Stand 1.5 seconds          Post Biometrics - 08/16/24 1654        Post  Biometrics    Height 5' 6 (1.676 m)    Weight 107 kg    Waist Circumference 48.5 inches    Hip Circumference 45 inches    Waist to Hip Ratio 1.08 %    BMI (Calculated) 38.09    Triceps Skinfold 16 mm    % Body Fat 35.9 %    Grip Strength 18 kg    Flexibility --   Chron9ic back pain. No attempt   Single Leg Stand 20 seconds          Nutrition:   Nutrition Discharge:   Education Questionnaire Score:  Knowledge Questionnaire Score - 08/22/24 0833       Knowledge Questionnaire Score   Post Score 22/24          Goals reviewed with patient; copy given to patient. Devon Wood  graduated from  [employer] cardiac rehab program on 08/23/24 with completion of 29 exercise and  15 education sessions. Pt maintained good attendance and progressed nicely during their participation in rehab as evidenced by increased MET level. Devon Wood increased his distance on his post exercise walk test by 610 feet and lost 1.5 kg while enrolled in the program.  Medication list reconciled. Repeat  PHQ score- 0 .  Pt has made significant lifestyle changes and should be commended for his success. Devon Wood achieved their goals during cardiac rehab.   Pt plans to continue exercise at [employer] 3 days a week. We are proud of Devon Wood's progress!Hadassah Elpidio Quan RN BSN      [1]  Social History Tobacco Use  Smoking Status Former   Current packs/day: 0.00   Average packs/day: 0.5 packs/day for 4.0 years (2.0 ttl pk-yrs)   Types: Cigarettes   Start date: 61   Quit date: 1984   Years since quitting: 42.0  Smokeless Tobacco Former   Types: Chew   Quit date: 1990

## 2024-09-06 ENCOUNTER — Encounter

## 2024-09-09 ENCOUNTER — Other Ambulatory Visit (HOSPITAL_COMMUNITY): Payer: Self-pay

## 2024-09-10 ENCOUNTER — Encounter

## 2024-09-10 VITALS — BP 125/78 | HR 72 | Wt 235.9 lb

## 2024-09-10 DIAGNOSIS — Z006 Encounter for examination for normal comparison and control in clinical research program: Secondary | ICD-10-CM

## 2024-09-10 MED ORDER — STUDY - ARTEMIS - ZILTIVEKIMAB 15 MG/0.5 ML OR PLACEBO SQ INJECTION (PI-CHRISTOPHER): 15.0000 mg | INJECTION | SUBCUTANEOUS | 0 refills | Status: AC

## 2024-09-10 NOTE — Research (Addendum)
 ARTEMIS V8 (Month 12 +/-7 days)  Were all eligibility criteria met to continue in study? [x] Yes [] No   Hep B DNA monitoring: [] Yes [x] No    Concomitant meds: [x] Yes [] No    VS, weight: [x] Yes [] No Subject resting for >5 mins before 3 sets of VS taken.   Physical Exam Was the physical examination performed? [x] Yes [] No Was the examination date the same as the visit date? [x] Yes [] No   Any hospitalizations/Adverse Events/Infections identified: [] Yes [x] No   EQ-5D-5L Assessment Completed? [x] Yes [] No Reason not done: [] Subject Forgot [] Subject too ill [] Subject refused [] Technical failure [] Other If Other, Specify Collection Date: 06/Jan/2026   Central Lab assessments drawn: [x] Yes [] No   Hs-CRP, biochemistry/hematology, PK sampling, immunogenicity assessments   Pregnancy Test Was the sample collected? [] Yes [] No [x] N/A Was the collection date the same as the visit date? [] Yes [] No Specimen Type: [] Serum [] Urine Collection Date: Collection Time: Pregnancy Test Result: [] Positive [] Negative [] Borderline [] Invalid   Study Drug dispensed via [facility]: [x] Yes [] No Subject returned 1 unused pen from kit # [id number] and 2 used pens from kit # [id number] with boxes. Pens placed in sharps container and boxes and unused pen returned to pharmacy. Subject took home 4 pens for home doses from kit # [id number] and  [id number]. Instructed to return used pens with boxes to next appt. Pt verbalized understanding.   Assess dosing and administration conditions: Yes [x]  No[]  Instructed patient to dose on same day every month to avoid doses less than 28 days apart. DFUs used for reference.    Ensure updated contact person list: [x] Yes [] No   Dosing diary updated: [x] Yes [] No    Current Medications[1]      [1]  Current Outpatient Medications:    aspirin  81 MG tablet, Take 81 mg by mouth daily., Disp: , Rfl:    clopidogrel  (PLAVIX ) 75 MG tablet, Take 1 tablet (75 mg total) by mouth daily., Disp:  90 tablet, Rfl: 3   co-enzyme Q-10 30 MG capsule, Take 30 mg by mouth 3 (three) times daily., Disp: , Rfl:    colchicine  0.6 MG tablet, Take 0.6 mg by mouth as needed (gout)., Disp: , Rfl:    empagliflozin  (JARDIANCE ) 25 MG TABS tablet, Take 1 tablet (25 mg total) by mouth daily before breakfast., Disp: 90 tablet, Rfl: 2   escitalopram  (LEXAPRO ) 10 MG tablet, Take 1 tablet (10 mg total) by mouth daily., Disp: 30 tablet, Rfl: 1   ezetimibe  (ZETIA ) 10 MG tablet, Take 1 tablet (10 mg total) by mouth daily., Disp: 90 tablet, Rfl: 3   famotidine  (PEPCID ) 20 MG tablet, Take 20 mg by mouth daily. , Disp: , Rfl:    Fe Fum-Vit C-Vit B12-FA (TRIGELS-F FORTE) CAPS capsule, Take 1 capsule by mouth daily after breakfast., Disp: , Rfl:    fluticasone  (FLONASE ) 50 MCG/ACT nasal spray, Place 2 sprays into both nostrils daily as needed for allergies., Disp: , Rfl:    meloxicam  (MOBIC ) 15 MG tablet, Take 15 mg by mouth daily., Disp: , Rfl:    metFORMIN  (GLUCOPHAGE ) 1000 MG tablet, Take 1,000 mg by mouth daily with breakfast., Disp: , Rfl:    metoprolol  tartrate (LOPRESSOR ) 25 MG tablet, Take 1 & 1/2 tablets (37.5 mg total) by mouth 2 (two) times daily., Disp: 270 tablet, Rfl: 1   nitroGLYCERIN  (NITROSTAT ) 0.4 MG SL tablet, Place 0.4 mg under the tongue every 5 (five) minutes as needed for chest pain., Disp: , Rfl:    rosuvastatin  (CRESTOR ) 20 MG tablet, Take 1 tablet (  20 mg total) by mouth daily., Disp: 90 tablet, Rfl: 3   sitaGLIPtin (JANUVIA) 100 MG tablet, Take 50 mg by mouth daily., Disp: , Rfl:    Study - ARTEMIS - ziltivekimab 15 mg/0.5 mL or placebo SQ injection (PI-Christopher), Inject 0.5 mLs (15 mg total) into the skin every 28 (twenty-eight) days., Disp: 2 mL, Rfl: 0   traMADol  (ULTRAM ) 50 MG tablet, Take 1 tablet (50 mg total) by mouth every 6 (six) hours as needed for severe pain (pain score 7-10)., Disp: 28 tablet, Rfl: 0 No current facility-administered medications for this  visit.  Facility-Administered Medications Ordered in Other Visits:    [facility], Patient & Family Education, , Does not apply, Once, [place], Linnie KIDD, MD

## 2024-09-10 NOTE — Progress Notes (Signed)
 Patient seen as a part of Artemis research study. Overall, no issues/concerns/hospitalizations. No recent illnesses. Spent the holidays with his family/grandkids.   BP 125/78 HR 72 O2 100%  General: Well developed, well nourished, male appearing in no acute distress.  Neck: Supple without bruits, JVD. Lungs:  Resp regular and unlabored, CTA. Heart: RRR, S1, S2, no S3, S4, or murmur; no rub. Abdomen: Soft, non-tender, non-distended with normoactive bowel sounds. Extremities: No clubbing, cyanosis, edema.  Neuro: Alert and oriented X 3. Moves all extremities spontaneously. Psych: Normal affect.  Plan: overall, continues to do well. Will continue on in study without changes  Signed, Manuelita Rummer, NP-C 09/10/2024, 10:26 AM

## 2024-09-13 NOTE — Research (Addendum)
 Are there any labs that are clinically significant?  Yes []  OR No[x]   Is the patient eligible to continue enrollment in the study after screening visit?  Yes [x]   OR No[] 

## 2024-09-20 ENCOUNTER — Other Ambulatory Visit: Payer: Self-pay | Admitting: Cardiovascular Disease

## 2024-09-23 ENCOUNTER — Other Ambulatory Visit (HOSPITAL_COMMUNITY): Payer: Self-pay

## 2024-09-23 ENCOUNTER — Other Ambulatory Visit: Payer: Self-pay | Admitting: Cardiovascular Disease

## 2024-09-23 ENCOUNTER — Other Ambulatory Visit: Payer: Self-pay | Admitting: Physician Assistant

## 2024-09-23 DIAGNOSIS — I2510 Atherosclerotic heart disease of native coronary artery without angina pectoris: Secondary | ICD-10-CM

## 2024-09-23 DIAGNOSIS — I10 Essential (primary) hypertension: Secondary | ICD-10-CM

## 2024-09-23 MED ORDER — METOPROLOL TARTRATE 25 MG PO TABS
37.5000 mg | ORAL_TABLET | Freq: Two times a day (BID) | ORAL | 1 refills | Status: AC
  Filled 2024-09-23: qty 270, 90d supply, fill #0

## 2024-09-23 NOTE — Telephone Encounter (Signed)
 Lipid Panel Completed on 04/04/24

## 2024-12-05 ENCOUNTER — Encounter
